# Patient Record
Sex: Male | Born: 1960
Health system: Southern US, Community
[De-identification: ages and names within clinical notes are randomized; demographics above are authoritative.]

## PROBLEM LIST (undated history)

## (undated) DIAGNOSIS — M6250 Muscle wasting and atrophy, not elsewhere classified, unspecified site: Secondary | ICD-10-CM

## (undated) DIAGNOSIS — Z973 Presence of spectacles and contact lenses: Secondary | ICD-10-CM

## (undated) DIAGNOSIS — R200 Anesthesia of skin: Secondary | ICD-10-CM

## (undated) DIAGNOSIS — Z923 Personal history of irradiation: Secondary | ICD-10-CM

## (undated) DIAGNOSIS — R29898 Other symptoms and signs involving the musculoskeletal system: Secondary | ICD-10-CM

## (undated) DIAGNOSIS — E039 Hypothyroidism, unspecified: Secondary | ICD-10-CM

## (undated) DIAGNOSIS — E782 Mixed hyperlipidemia: Secondary | ICD-10-CM

## (undated) DIAGNOSIS — H903 Sensorineural hearing loss, bilateral: Secondary | ICD-10-CM

## (undated) DIAGNOSIS — R490 Dysphonia: Secondary | ICD-10-CM

## (undated) DIAGNOSIS — R29810 Facial weakness: Secondary | ICD-10-CM

## (undated) DIAGNOSIS — R1312 Dysphagia, oropharyngeal phase: Secondary | ICD-10-CM

## (undated) DIAGNOSIS — R202 Paresthesia of skin: Secondary | ICD-10-CM

## (undated) DIAGNOSIS — K4090 Unilateral inguinal hernia, without obstruction or gangrene, not specified as recurrent: Secondary | ICD-10-CM

## (undated) DIAGNOSIS — C801 Malignant (primary) neoplasm, unspecified: Secondary | ICD-10-CM

## (undated) DIAGNOSIS — I10 Essential (primary) hypertension: Secondary | ICD-10-CM

## (undated) DIAGNOSIS — M1990 Unspecified osteoarthritis, unspecified site: Secondary | ICD-10-CM

## (undated) DIAGNOSIS — I951 Orthostatic hypotension: Secondary | ICD-10-CM

## (undated) DIAGNOSIS — Z974 Presence of external hearing-aid: Secondary | ICD-10-CM

## (undated) DIAGNOSIS — G5602 Carpal tunnel syndrome, left upper limb: Secondary | ICD-10-CM

## (undated) DIAGNOSIS — E785 Hyperlipidemia, unspecified: Secondary | ICD-10-CM

## (undated) DIAGNOSIS — K219 Gastro-esophageal reflux disease without esophagitis: Secondary | ICD-10-CM

## (undated) DIAGNOSIS — R350 Frequency of micturition: Secondary | ICD-10-CM

## (undated) HISTORY — DX: Hyperlipidemia, unspecified: E78.5

---

## 2015-05-13 DIAGNOSIS — Z9221 Personal history of antineoplastic chemotherapy: Secondary | ICD-10-CM

## 2015-05-13 HISTORY — DX: Personal history of antineoplastic chemotherapy: Z92.21

## 2015-12-15 DIAGNOSIS — J018 Other acute sinusitis: Secondary | ICD-10-CM | POA: Diagnosis not present

## 2015-12-15 DIAGNOSIS — H66002 Acute suppurative otitis media without spontaneous rupture of ear drum, left ear: Secondary | ICD-10-CM | POA: Diagnosis not present

## 2016-01-07 DIAGNOSIS — R6884 Jaw pain: Secondary | ICD-10-CM | POA: Diagnosis not present

## 2016-01-07 DIAGNOSIS — I10 Essential (primary) hypertension: Secondary | ICD-10-CM | POA: Diagnosis not present

## 2016-01-07 DIAGNOSIS — Z6829 Body mass index (BMI) 29.0-29.9, adult: Secondary | ICD-10-CM | POA: Diagnosis not present

## 2016-02-06 ENCOUNTER — Other Ambulatory Visit: Payer: Self-pay | Admitting: Internal Medicine

## 2016-02-06 DIAGNOSIS — R6884 Jaw pain: Secondary | ICD-10-CM

## 2016-02-06 DIAGNOSIS — L048 Acute lymphadenitis of other sites: Secondary | ICD-10-CM | POA: Diagnosis not present

## 2016-02-06 DIAGNOSIS — Z6829 Body mass index (BMI) 29.0-29.9, adult: Secondary | ICD-10-CM | POA: Diagnosis not present

## 2016-02-07 ENCOUNTER — Ambulatory Visit
Admission: RE | Admit: 2016-02-07 | Discharge: 2016-02-07 | Disposition: A | Discharge status: Home / Self Care | Payer: BLUE CROSS/BLUE SHIELD | Source: Ambulatory Visit | Attending: Internal Medicine | Admitting: Internal Medicine

## 2016-02-07 DIAGNOSIS — R6884 Jaw pain: Secondary | ICD-10-CM

## 2016-02-07 DIAGNOSIS — R1310 Dysphagia, unspecified: Secondary | ICD-10-CM | POA: Diagnosis not present

## 2016-02-07 DIAGNOSIS — R220 Localized swelling, mass and lump, head: Secondary | ICD-10-CM | POA: Diagnosis not present

## 2016-02-07 MED ORDER — IOPAMIDOL (ISOVUE-300) INJECTION 61%
75.0000 mL | Freq: Once | INTRAVENOUS | Status: AC | PRN
  Administered 2016-02-07: 75 mL via INTRAVENOUS

## 2016-02-10 DIAGNOSIS — C099 Malignant neoplasm of tonsil, unspecified: Secondary | ICD-10-CM

## 2016-02-10 HISTORY — DX: Malignant neoplasm of tonsil, unspecified: C09.9

## 2016-02-14 DIAGNOSIS — R1310 Dysphagia, unspecified: Secondary | ICD-10-CM | POA: Diagnosis not present

## 2016-02-14 DIAGNOSIS — C099 Malignant neoplasm of tonsil, unspecified: Secondary | ICD-10-CM | POA: Diagnosis not present

## 2016-02-14 DIAGNOSIS — D3709 Neoplasm of uncertain behavior of other specified sites of the oral cavity: Secondary | ICD-10-CM | POA: Diagnosis not present

## 2016-02-14 DIAGNOSIS — J343 Hypertrophy of nasal turbinates: Secondary | ICD-10-CM | POA: Diagnosis not present

## 2016-02-14 DIAGNOSIS — C109 Malignant neoplasm of oropharynx, unspecified: Secondary | ICD-10-CM | POA: Diagnosis not present

## 2016-02-14 DIAGNOSIS — C770 Secondary and unspecified malignant neoplasm of lymph nodes of head, face and neck: Secondary | ICD-10-CM | POA: Diagnosis not present

## 2016-02-14 DIAGNOSIS — R221 Localized swelling, mass and lump, neck: Secondary | ICD-10-CM | POA: Diagnosis not present

## 2016-02-20 ENCOUNTER — Telehealth: Payer: Self-pay | Admitting: *Deleted

## 2016-02-20 ENCOUNTER — Other Ambulatory Visit: Payer: Self-pay | Admitting: Radiation Oncology

## 2016-02-20 ENCOUNTER — Encounter: Payer: Self-pay | Admitting: *Deleted

## 2016-02-20 ENCOUNTER — Encounter: Payer: Self-pay | Admitting: Hematology and Oncology

## 2016-02-20 NOTE — Telephone Encounter (Signed)
Oncology Nurse Navigator Documentation  Spoke with Mr. Weiand wife:  Informed her of adjusted/new appointments next Tuesday 1017:  Dr. Alvy Bimler 12:00, 1:00 RadOnc Nurse Eval and 12:30 Dr. Isidore Moos.    I explained we are in the process of scheduling PET with goal of completing before these appointments.  I answered her questions re radiation and chemotherapy treatment.    We discussed placement of PEG and PAC, evaluation by Dental Medicine, expected timeframe of treatment start date.  I provided her my contact information, encouraged her to call with questions prior to next week's appointments. She expressed appreciation for my call.  Gayleen Orem, RN, BSN, [place] at [place] [phone]

## 2016-02-20 NOTE — Telephone Encounter (Signed)
Oncology Nurse Navigator Documentation  Placed introductory call to new referral patient Todd [name].  Introduced myself as the H&N oncology nurse navigator that works with Drs. Isidore Moos and Alvy Bimler to whom he has been referred by Dr. Redmond Baseman.  He confirmed his understanding of referral and appt date of 10/17 to see Dr. Isidore Moos.  I explained I am in the process of scheduling appt with Dr. Alvy Bimler for same day.  He stated Dr. Redmond Baseman is scheduling PET, I noted I would follow-up with his office.  I briefly explained my role as his navigator, indicated that I would be joining him during his appts next week.  I confirmed his understanding of [place] location.    I provided my contact information, encouraged him to call with questions/concerns before next week.  He verbalized understanding of information provided, expressed appreciation for my call.  Gayleen Orem, RN, BSN, [name] at [place] [phone]

## 2016-02-21 ENCOUNTER — Telehealth: Payer: Self-pay | Admitting: *Deleted

## 2016-02-21 ENCOUNTER — Encounter: Payer: Self-pay | Admitting: Radiation Oncology

## 2016-02-21 ENCOUNTER — Other Ambulatory Visit: Payer: Self-pay | Admitting: Radiation Therapy

## 2016-02-21 ENCOUNTER — Other Ambulatory Visit: Payer: Self-pay | Admitting: Radiation Oncology

## 2016-02-21 DIAGNOSIS — C099 Malignant neoplasm of tonsil, unspecified: Secondary | ICD-10-CM

## 2016-02-21 NOTE — Telephone Encounter (Signed)
Oncology Nurse Navigator Documentation  Spoke with patient's wife, informed he has PET at [facility] next Tuesday morning, 0800 arrival, [facility], to be NPO including candy/gum beginning Monday midnight.  She voiced understanding.  Gayleen Orem, RN, BSN, [place] at [place] [phone]

## 2016-02-21 NOTE — Progress Notes (Signed)
Head and Neck Cancer Location of Tumor / Histology:  02/14/16 LEFT TONSIL, BIOPSY: Invasive squamous cell carcinoma, moderately differentiated TestResults Reference Values HPVHR type 16, PCRNegativeNegative HPVHR type 18, PCRNegativeNegative HPV other HR types, PCR NegativeNegative (Negative for one of the other High Risk HPV types:31, 33, 35, 39, 45, 51, 52, 56, 58, 59, 66 and 68) 02/14/16 Left Neck mass, Fine Needle Aspiration I (smears and ThinPrep): Squamous cell carcinoma.  Patient presented with symptoms of: Dr. Redmond Baseman documents on 02/14/16: Todd [name] presents as a new patient with a chief complaint of neck mass. Todd [name] was found to have a swollen lymph node in the left neck in April. At the end of July, his jaw locked open. Todd [name] forced it shut. Todd [name] went to a minute clinic in August due to post-nasal drainage. Todd [name] was treated with Augmentin. The practitioner noted the left neck node swelling. Todd [name] was then seen at his PCP where Todd [name] was treated with prednisone. X-rays of the jaw looked fine. About 1 1/2 weeks ago, Todd [name] started having difficulty swallowing. Solid or liquid is difficult to swallow. Last week, his doctor ordered a CT scan of the neck and referred him here. Todd [name] feels some pain in his left jaw. Biting down on his first bite of food is painful. His voice is sounding more muffled. Todd [name] has lost 9 pounds in the past two weeks. Last weekend, Todd [name] spit out 4-5 mouthfuls of blood. Todd [name] smoked a couple of years in his teens. Todd [name] is not a heavy drinker.  Biopsies of Left Tonsil revealed: Invasive squamous cell carcinoma, moderately differentiated.   Nutrition Status Yes No Comments  Weight changes? [x]  []  Todd [name] reports losing about 10-12 lbs over the last 4 weeks  Swallowing concerns? [x]  []  Todd [name] has problems swallowing, says "it just won't go down". Food that Todd [name] can chew up go down easier than softer foods. Todd [name] is  able to drink liquids.   PEG? []  [x]     Referrals Yes No Comments  Social Work? []  [x]    Dentistry? []  [x]    Swallowing therapy? []  [x]    Nutrition? []  [x]    Med/Onc? [x]  []  Dr. Alvy Bimler, 02/26/16. Todd [name] will receive Cisplatin Q 21 days X 3 doses.   Safety Issues Yes No Comments  Prior radiation? []  [x]    Pacemaker/ICD? []  [x]    Possible current pregnancy? []  [x]    Is the patient on methotrexate? []  [x]     Tobacco/Marijuana/Snuff/ETOH use: Todd [name] smoked briefly in his teens. Todd [name] denies alcohol use.   Past/Anticipated interventions by otolaryngology, if any:  Dr. Redmond Baseman 02/14/16 Procedure: Biopsy of left tonsil mass --Todd [name] recommends concurrent chemotherapy and radiation.    Past/Anticipated interventions by medical oncology, if any:  Dr. Alvy Bimler today (02/26/16). She recommends cisplatin every 3 weeks for 3 doses total.      Current Complaints / other details:   PET 02/26/16 8:30  BP 131/87   Pulse 64   Temp 98.2 F (36.8 C)   Ht 5\' 9"  (1.753 m)   Wt 195 lb 9.6 oz (88.7 kg)   SpO2 96% Comment: room air  BMI 28.89 kg/m    Wt Readings from Last 3 Encounters:  02/26/16 195 lb 9.6 oz (88.7 kg)  02/26/16 195 lb 8 oz (88.7 kg)  02/26/16 193 lb (87.5 kg)

## 2016-02-25 ENCOUNTER — Telehealth: Payer: Self-pay | Admitting: *Deleted

## 2016-02-25 NOTE — Progress Notes (Addendum)
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 Carcinoma of tonsillar fossa (HCC) 146.1 C09.0     HISTORY OF PRESENT ILLNESS::Lenoard Stacey is a 55 y.o. male who presented with a swollen lymph node in the left neck in April. At the end of July, his jaw locked open. He forced it shut. He went to a minute clinic in August due to post-nasal drainage. He was treated with Augmentin. The practitioner noted the left neck node swelling. He was then seen at his PCP where he was treated with prednisone.     Recently, he started having difficulty swallowing, especially solids. His doctor ordered a CT scan of the neck and referred him To Dr. Redmond Baseman. He reported some pain in his left jaw. Prior to the day of biopsy he spit out 4-5 mouthfuls of blood. He smoked a couple of years in his teens. No snuff. He has ETOH seldom.   Biopsy of left tonsil on 02-14-16 revealed:   Invasive squamous cell carcinoma, moderately differentiated. Negative for HPV.   Pertinent imaging thus far includes CT neck from 9-28 and PET CT on 10-17.  Images reviewed.  PET is notable for revealing bilateral malignant neck adenopathy.  The left dominant upper neck mass is >6cm.  The left tonsil tumor appears T2.  Non specific subcentimeter lung nodules.      Nutrition Status Yes No Comments  Weight changes? [x]  []  He reports losing about 10-12 lbs over the last 4 weeks  Swallowing concerns? [x]  []  He has problems swallowing, says "it just won't go down". Food that he can chew up go down easier than softer foods. He is able to drink liquids.   PEG? []  [x]     Referrals Yes No Comments    Social Work? []  [x]    Dentistry? []  [x]    Swallowing therapy? []  [x]    Nutrition? []  [x]    Med/Onc? [x]  []  Dr. Alvy Bimler, 02/26/16. He will receive Cisplatin Q 21 days X 3 doses.   Safety Issues Yes No Comments  Prior radiation? []  [x]    Pacemaker/ICD? []  [x]    Possible current pregnancy? []  [x]    Is the patient on methotrexate? []  [x]      PREVIOUS RADIATION THERAPY: No  PAST MEDICAL HISTORY:  has a past medical history of Arthritis; Cancer ([place]); Hyperlipidemia; Hypertension; and Hypothyroidism.    PAST SURGICAL HISTORY:No past surgical history on file.  FAMILY HISTORY: family history includes Cancer in his maternal grandmother.  SOCIAL HISTORY:  reports that he has quit smoking. He has never used smokeless tobacco. He reports that he does not drink alcohol or use drugs.  ALLERGIES: Review of patient's allergies indicates no known allergies.  MEDICATIONS:  Current Outpatient Prescriptions  Medication Sig Dispense Refill  . acetaminophen (TYLENOL) 650 MG CR tablet Take 1,300 mg by mouth.    . fenofibrate (TRICOR) 145 MG tablet     . GLUCOSAMINE SULFATE PO Take by mouth.    . levothyroxine (SYNTHROID, LEVOTHROID) 50 MCG tablet     . losartan (COZAAR) 100 MG tablet     . Multiple Vitamin (MULTIVITAMIN) capsule Take by mouth.    [name] aspirin EC 81 MG tablet  Take by mouth.     No current facility-administered medications for this encounter.     REVIEW OF SYSTEMS:  Notable for that above.   PHYSICAL EXAM:  height is 5\' 9"  (1.753 m) and weight is 195 lb 9.6 oz (88.7 kg). His temperature is 98.2 F (36.8 C). His blood pressure is 131/87 and his pulse is 64. His oxygen saturation is 96%.   General: Alert and oriented, in no acute distress HEENT: Head is normocephalic. Extraocular movements are intact. Oropharynx is notable for ulcerative left tonsil mass which has started to erode left lateral uvula and appears to involve base of tongue as well. Neck: Neck is notable for palpable  level Ib through level 2,3 left neck mass, at least 6cm Heart: Regular in rate and rhythm with no murmurs, rubs, or gallops. Chest: Clear to auscultation bilaterally, with no rhonchi, wheezes, or rales. Abdomen: Soft, nontender, nondistended, with no rigidity or guarding. Extremities: No cyanosis or edema. Lymphatics: see Neck Exam Skin: No concerning lesions. Musculoskeletal: symmetric strength and muscle tone throughout. Neurologic: Cranial nerves II through XII are grossly intact. No obvious focalities. Speech is fluent. Coordination is intact. Psychiatric: Judgment and insight are intact. Affect is appropriate.   ECOG = 0  0 - Asymptomatic (Fully active, able to carry on all predisease activities without restriction)  1 - Symptomatic but completely ambulatory (Restricted in physically strenuous activity but ambulatory and able to carry out work of a light or sedentary nature. For example, light housework, office work)  2 - Symptomatic, <50% in bed during the day (Ambulatory and capable of all self care but unable to carry out any work activities. Up and about more than 50% of waking hours)  3 - Symptomatic, >50% in bed, but not bedbound (Capable of only limited self-care, confined to bed or chair 50% or more of waking hours)  4 - Bedbound (Completely disabled. Cannot carry on any self-care. Totally confined to bed or chair)  5 - Death   Eustace Pen MM, Creech RH, Tormey DC, et al. [phone]). "Toxicity and response criteria of the [facility]". [place] Oncol. 5 (6): 649-55   LABORATORY DATA:  No results found for: WBC, HGB, HCT, MCV, PLT CMP  No results found for: NA, K, CL, CO2, GLUCOSE, BUN, CREATININE, CALCIUM, PROT, ALBUMIN, AST, ALT, ALKPHOS, BILITOT, GFRNONAA, GFRAA    No results found for: TSH     RADIOGRAPHY: Ct Soft Tissue Neck W Contrast  Result Date: 02/07/2016 CLINICAL DATA:  Left jaw pain and swelling.  Dysphagia. EXAM: CT MAXILLOFACIAL WITH  CONTRAST CT NECK WITH CONTRAST TECHNIQUE: Multidetector CT imaging of the maxillofacial structures and neck was performed with intravenous contrast. Multiplanar CT image reconstructions were also generated. A small metallic BB was placed on the right temple in order to reliably differentiate right from left. CONTRAST:  45mL ISOVUE-300 IOPAMIDOL (ISOVUE-300) INJECTION 61% COMPARISON:  None. FINDINGS: CT MAXILLOFACIAL FINDINGS Osseous:  No facial fracture identified. Orbits: The globes appear intact. Normal appearance of the intra- and extraconal fat. Symmetric extraocular muscles. Sinuses: No fluid levels or advanced mucosal thickening. Limited intracranial: Normal. CT NECK FINDINGS Pharynx and larynx: There is asymmetric fullness of the left pharynx, which begins at the left tonsillar pillar and extends inferiorly to the level of the hyoid bone, where there is extension into the left vallecular. The left parapharyngeal space is displaced anteriorly. It is difficult to discern whether this is a single lesion or multiple confluent lesions. No retropharyngeal collection.  The nasopharynx is clear. Salivary glands: Thyroid:  Normal. Lymph nodes: There is confluent adenopathy within the left neck, extending from left level IIa 2 left level 4. This confluent soft tissue measures approximately 5.4 x 2.9 by 8.0 cm. The largest lymph node on the right measures 1.2 cm and is located at level IIa. Vascular:  The major cervical vessels are normal. Upper chest:  No pulmonary nodules or pleural effusion. Musculoskeletal: No lytic or blastic lesions. No bony spinal canal stenosis. IMPRESSION: 1. Abnormal soft tissue within the left pharynx, extending from the left tonsillar pillar inferiorly to the level of the hyoid and filling the left vallecula. The appearance is most consistent with a neoplastic process. 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 Histologic sampling and direct visualization should be  considered. 2. Confluent adenopathy within the left cervical chain with areas of central hypoattenuation suggesting degrees of necrosis. This is most suggestive of metastatic spread. The areas of necrosis within the adenopathy combined with the above-described pharyngeal soft tissue mass are together suggestive of squamous cell carcinoma. These results will be called to the ordering clinician or representative by the Radiologist Assistant, and communication documented in the PACS or zVision Dashboard. Electronically Signed   By: Ulyses Jarred M.D.   On: 02/07/2016 18:21   Nm Pet Image Initial (pi) Skull Base To Thigh  Result Date: 02/26/2016 CLINICAL DATA:  Initial treatment strategy for tonsillar cancer. Left neck mass. EXAM: NUCLEAR MEDICINE PET SKULL BASE TO THIGH TECHNIQUE: 12.6 mCi F-18 FDG was injected intravenously. Full-ring PET imaging was performed from the skull base to thigh after the radiotracer. CT data was obtained and used for attenuation correction and anatomic localization. FASTING BLOOD GLUCOSE:  Value: 102 mg/dl COMPARISON:  02/07/2016 FINDINGS: NECK Abnormal left tonsillar pillar and pharyngeal activity down to the vallecula as on recent CT, maximum standard uptake value of this conglomerate mass 17.9. Left station IIa and station III lymph nodes are present, maximum SUV 12.1. There is also a left station Ib indistinct lymph node measuring about 10 mm in short axis with maximum SUV 9.2. A right station IIa lymph node measuring 1.3 cm in short axis on image 28 series 3 has maximum standard uptake value of 9.0 Chronic bilateral maxillary sinusitis. Small right-sided segment to be lymph node measuring only about 5 mm in short axis has a maximum standard uptake value of 3.4, borderline increased. CHEST No hypermetabolic mediastinal or hilar nodes. 0.7 by 0.4 cm right upper lobe nodule image 87/3, no associated hypermetabolic activity is appreciable, with the understanding that nodules of this  size range too small for accurate detection. Along the right major fissure there is a 2-3 mm nodule on image 108/3, again no measurable hypermetabolic activity but technically too small to characterize. 3 mm similar nodule in the left lower lobe, image 100/3. Coronary artery atherosclerosis. ABDOMEN/PELVIS No abnormal hypermetabolic activity within the liver, pancreas, adrenal glands, or spleen. No hypermetabolic lymph nodes in the abdomen or pelvis. SKELETON No focal hypermetabolic activity to suggest skeletal metastasis. IMPRESSION: 1. Hypermetabolic left tonsillar in pharyngeal mass with hypermetabolic left sided station IA, station III, and station Ib adenopathy and hypermetabolic station IIa adenopathy on the right. Borderline hypermetabolic small right station IIb lymph node. 2. Several tiny lung nodules are observed, these are not hypermetabolic but are also below the threshold level for accurate characterization. Dedicated CT chest may be warranted to act as a baseline. 3. Coronary artery atherosclerosis. Electronically Signed   By: Thayer Jew  Janeece Fitting M.D.   On: 02/26/2016 13:59   Ct Maxillofacial W Contrast  Result Date: 02/07/2016 CLINICAL DATA:  Left jaw pain and swelling.  Dysphagia. EXAM: CT MAXILLOFACIAL WITH CONTRAST CT NECK WITH CONTRAST TECHNIQUE: Multidetector CT imaging of the maxillofacial structures and neck was performed with intravenous contrast. Multiplanar CT image reconstructions were also generated. A small metallic BB was placed on the right temple in order to reliably differentiate right from left. CONTRAST:  47mL ISOVUE-300 IOPAMIDOL (ISOVUE-300) INJECTION 61% COMPARISON:  None. FINDINGS: CT MAXILLOFACIAL FINDINGS Osseous:  No facial fracture identified. Orbits: The globes appear intact. Normal appearance of the intra- and extraconal fat. Symmetric extraocular muscles. Sinuses: No fluid levels or advanced mucosal thickening. Limited intracranial: Normal. CT NECK FINDINGS Pharynx and  larynx: There is asymmetric fullness of the left pharynx, which begins at the left tonsillar pillar and extends inferiorly to the level of the hyoid bone, where there is extension into the left vallecular. The left parapharyngeal space is displaced anteriorly. It is difficult to discern whether this is a single lesion or multiple confluent lesions. No retropharyngeal collection. The nasopharynx is clear. Salivary glands: Thyroid:  Normal. Lymph nodes: There is confluent adenopathy within the left neck, extending from left level IIa 2 left level 4. This confluent soft tissue measures approximately 5.4 x 2.9 by 8.0 cm. The largest lymph node on the right measures 1.2 cm and is located at level IIa. Vascular:  The major cervical vessels are normal. Upper chest:  No pulmonary nodules or pleural effusion. Musculoskeletal: No lytic or blastic lesions. No bony spinal canal stenosis. IMPRESSION: 1. Abnormal soft tissue within the left pharynx, extending from the left tonsillar pillar inferiorly to the level of the hyoid and filling the left vallecula. The appearance is most consistent with a neoplastic process. 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 Histologic sampling and direct visualization should be considered. 2. Confluent adenopathy within the left cervical chain with areas of central hypoattenuation suggesting degrees of necrosis. This is most suggestive of metastatic spread. The areas of necrosis within the adenopathy combined with the above-described pharyngeal soft tissue mass are together suggestive of squamous cell carcinoma. These results will be called to the ordering clinician or representative by the Radiologist Assistant, and communication documented in the PACS or zVision Dashboard. Electronically Signed   By: Ulyses Jarred M.D.   On: 02/07/2016 18:21      IMPRESSION/PLAN:  This is a delightful patient with tonsil head and neck cancer. I do recommend radiotherapy for this  patient. Tumor board discussion is tomorrow.  We discussed the potential risks, benefits, and side effects of radiotherapy. We talked in detail about acute and late effects. We discussed that some of the most bothersome acute effects may be mucositis, dysgeusia, salivary changes, skin irritation, hair loss, dehydration, weight loss and fatigue. We talked about late effects which include but are not necessarily limited to dysphagia, hypothyroidism, nerve injury, spinal cord injury, xerostomia, trismus, and neck edema. No guarantees of treatment were given. A consent form was signed and placed in the patient's medical record. The patient is enthusiastic about proceeding with treatment. I look forward to participating in the patient's care.    Simulation (treatment planning) will take place after clearance by dentistry  We also discussed that the treatment of head and neck cancer is a multidisciplinary process to maximize treatment outcomes and quality of life. For this reasons the following referrals have been or will be made (thank you to Med Onc  for making the referrals)    Medical oncology to discuss chemotherapy - recommended CDDP concurrently    Dentistry for dental evaluation, possible extractions in the radiation fields, and /or advice on reducing risk of cavities, osteoradionecrosis, or other oral issues.    Nutritionist for nutrition support during and after treatment.    Speech language pathology for swallowing and/or speech therapy.    Social work for social support.     Physical therapy due to risk of lymphedema in neck and deconditioning.  __________________________________________   Eppie Gibson, MD

## 2016-02-25 NOTE — Telephone Encounter (Signed)
We received a letter about appointments that reads to call to comfirm.  He will be present for all appointments.

## 2016-02-26 ENCOUNTER — Encounter: Payer: Self-pay | Admitting: Hematology and Oncology

## 2016-02-26 ENCOUNTER — Ambulatory Visit
Admission: RE | Admit: 2016-02-26 | Discharge: 2016-02-26 | Disposition: A | Discharge status: Home / Self Care | Payer: BLUE CROSS/BLUE SHIELD | Source: Ambulatory Visit | Attending: Radiation Oncology | Admitting: Radiation Oncology

## 2016-02-26 ENCOUNTER — Encounter (HOSPITAL_COMMUNITY): Payer: BLUE CROSS/BLUE SHIELD

## 2016-02-26 ENCOUNTER — Ambulatory Visit (HOSPITAL_BASED_OUTPATIENT_CLINIC_OR_DEPARTMENT_OTHER): Payer: BLUE CROSS/BLUE SHIELD | Admitting: Hematology and Oncology

## 2016-02-26 ENCOUNTER — Encounter: Payer: Self-pay | Admitting: *Deleted

## 2016-02-26 ENCOUNTER — Encounter: Payer: Self-pay | Admitting: Radiation Oncology

## 2016-02-26 ENCOUNTER — Encounter
Admission: RE | Admit: 2016-02-26 | Discharge: 2016-02-26 | Disposition: A | Discharge status: Home / Self Care | Payer: BLUE CROSS/BLUE SHIELD | Source: Ambulatory Visit | Attending: Radiation Oncology | Admitting: Radiation Oncology

## 2016-02-26 VITALS — BP 139/74 | HR 65 | Temp 98.0°F | Resp 18 | Ht 69.0 in | Wt 195.5 lb

## 2016-02-26 DIAGNOSIS — Z809 Family history of malignant neoplasm, unspecified: Secondary | ICD-10-CM | POA: Diagnosis not present

## 2016-02-26 DIAGNOSIS — E039 Hypothyroidism, unspecified: Secondary | ICD-10-CM | POA: Insufficient documentation

## 2016-02-26 DIAGNOSIS — R634 Abnormal weight loss: Secondary | ICD-10-CM | POA: Insufficient documentation

## 2016-02-26 DIAGNOSIS — Z7982 Long term (current) use of aspirin: Secondary | ICD-10-CM | POA: Insufficient documentation

## 2016-02-26 DIAGNOSIS — R042 Hemoptysis: Secondary | ICD-10-CM | POA: Diagnosis not present

## 2016-02-26 DIAGNOSIS — C099 Malignant neoplasm of tonsil, unspecified: Secondary | ICD-10-CM

## 2016-02-26 DIAGNOSIS — C090 Malignant neoplasm of tonsillar fossa: Secondary | ICD-10-CM

## 2016-02-26 DIAGNOSIS — R1310 Dysphagia, unspecified: Secondary | ICD-10-CM | POA: Insufficient documentation

## 2016-02-26 DIAGNOSIS — M1990 Unspecified osteoarthritis, unspecified site: Secondary | ICD-10-CM | POA: Diagnosis not present

## 2016-02-26 DIAGNOSIS — Z510 Encounter for antineoplastic radiation therapy: Secondary | ICD-10-CM | POA: Insufficient documentation

## 2016-02-26 DIAGNOSIS — Z79899 Other long term (current) drug therapy: Secondary | ICD-10-CM | POA: Diagnosis not present

## 2016-02-26 DIAGNOSIS — I10 Essential (primary) hypertension: Secondary | ICD-10-CM | POA: Insufficient documentation

## 2016-02-26 DIAGNOSIS — E785 Hyperlipidemia, unspecified: Secondary | ICD-10-CM | POA: Insufficient documentation

## 2016-02-26 DIAGNOSIS — Z87891 Personal history of nicotine dependence: Secondary | ICD-10-CM

## 2016-02-26 HISTORY — DX: Malignant (primary) neoplasm, unspecified: C80.1

## 2016-02-26 HISTORY — DX: Essential (primary) hypertension: I10

## 2016-02-26 HISTORY — DX: Hypothyroidism, unspecified: E03.9

## 2016-02-26 HISTORY — DX: Unspecified osteoarthritis, unspecified site: M19.90

## 2016-02-26 LAB — GLUCOSE, CAPILLARY: GLUCOSE-CAPILLARY: 102 mg/dL — AB (ref 65–99)

## 2016-02-26 MED ORDER — FLUDEOXYGLUCOSE F - 18 (FDG) INJECTION
12.0000 | Freq: Once | INTRAVENOUS | Status: AC
  Administered 2016-02-26: 12.64 via INTRAVENOUS

## 2016-02-26 NOTE — Progress Notes (Signed)
[place] NOTE  Patient Care Team: Haywood Pao, MD as PCP - General (Internal Medicine) Leota Sauers, RN as Oncology Nurse Navigator Eppie Gibson, MD as Attending Physician (Radiation Oncology) Heath Lark, MD as Consulting Physician (Hematology and Oncology) Karie Mainland, RD as Dietitian (Nutrition)  CHIEF COMPLAINTS/PURPOSE OF CONSULTATION:  Left tonsil cancer with bilateral lymphadenopathy  HISTORY OF PRESENTING ILLNESS:  Todd [name] 55 y.o. male is here because of newly diagnosed left tonsil cancer According to the patient, the first initial presentation was due to sensation of locked jaw, dysphagia and palpable regional lymphadenopathy. He is not able to tolerate certain consistency of the foods such as bread & mashed potato but was able to tolerate certain solid food and liquids. The patient has noted progressive lymphadenopathy in the size of the lymph nodes on the left side of the neck. Starting around and of July 2017, he had an episode of locked jaw. He was seen at the urgent care place and was prescribed Augmentin and prednisone with no resolution. He started to have hemoptysis and progressive dysphagia. He was subsequently evaluated with imaging study and biopsy. Summary of his oncology history as follows:   Tonsil cancer ([place])   02/07/2016 Imaging    Ct neck showed abnormal soft tissue within the left pharynx, extending from the left tonsillar pillar inferiorly to the level of the hyoid and filling the left vallecula. The appearance is most consistent with a neoplastic process. 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 Histologic sampling and direct visualization should be considered. Confluent adenopathy within the left cervical chain with areas of central hypoattenuation suggesting degrees of necrosis. This is most suggestive of metastatic spread. The areas of necrosis within the adenopathy combined with the  above-described pharyngeal soft tissue mass are together suggestive of squamous cell carcinoma.      02/14/2016 Procedure    He has FNA of left neck LN       02/14/2016 Pathology Results    [id number] cytology confirmed squamous cell carcinoma       he has mild left sided hearing deficit, difficulties with chewing food, swallowing difficulties, and 10 pounds weight loss but denies painful swallowing or changes in the quality of his voice.  MEDICAL HISTORY:  Past Medical History:  Diagnosis Date  . Arthritis   . Cancer ([place])    Tonsil Cancer  . Hyperlipidemia   . Hypertension   . Hypothyroidism     SURGICAL HISTORY: History reviewed. No pertinent surgical history.  SOCIAL HISTORY: Social History   Social History  . Marital status: Married    Spouse name: Jenny Reichmann  . Number of children: 3  . Years of education: N/A   Occupational History  . sales rep    Social History Main Topics  . Smoking status: Former Research scientist (life sciences)  . Smokeless tobacco: Never Used  . Alcohol use No  . Drug use: No  . Sexual activity: Not on file   Other Topics Concern  . Not on file   Social History Narrative  . No narrative on file    FAMILY HISTORY: Family History  Problem Relation Age of Onset  . Cancer Maternal Grandmother     unknown ca    ALLERGIES:  has No Known Allergies.  MEDICATIONS:  Current Outpatient Prescriptions  Medication Sig Dispense Refill  . acetaminophen (TYLENOL) 650 MG CR tablet Take 1,300 mg by mouth.    [name] aspirin EC 81 MG tablet Take by mouth.    [name]  fenofibrate (TRICOR) 145 MG tablet     . GLUCOSAMINE SULFATE PO Take by mouth.    . levothyroxine (SYNTHROID, LEVOTHROID) 50 MCG tablet     . losartan (COZAAR) 100 MG tablet     . Multiple Vitamin (MULTIVITAMIN) capsule Take by mouth.     No current facility-administered medications for this visit.     REVIEW OF SYSTEMS:   Constitutional: Denies fevers, chills or abnormal night sweats Eyes: Denies blurriness of  vision, double vision or watery eyes Respiratory: Denies cough, dyspnea or wheezes Cardiovascular: Denies palpitation, chest discomfort or lower extremity swelling Gastrointestinal:  Denies nausea, heartburn or change in bowel habits Skin: Denies abnormal skin rashes Neurological:Denies numbness, tingling or new weaknesses Behavioral/Psych: Mood is stable, no new changes  All other systems were reviewed with the patient and are negative.  PHYSICAL EXAMINATION: ECOG PERFORMANCE STATUS: 1 - Symptomatic but completely ambulatory  Vitals:   02/26/16 1152  BP: 139/74  Pulse: 65  Resp: 18  Temp: 98 F (36.7 C)   Filed Weights   02/26/16 1152  Weight: 195 lb 8 oz (88.7 kg)    GENERAL:alert, no distress and comfortable SKIN: skin color, texture, turgor are normal, no rashes or significant lesions EYES: normal, conjunctiva are pink and non-injected, sclera clear OROPHARYNX:no exudate, no erythema and lips, buccal mucosa, and tongue normal  NECK: supple, thyroid normal size, non-tender, without nodularity LYMPH:  no palpable lymphadenopathy in the cervical, axillary or inguinal LUNGS: clear to auscultation and percussion with normal breathing effort HEART: regular rate & rhythm and no murmurs and no lower extremity edema ABDOMEN:abdomen soft, non-tender and normal bowel sounds Musculoskeletal:no cyanosis of digits and no clubbing  PSYCH: alert & oriented x 3 with fluent speech NEURO: no focal motor/sensory deficits  RADIOGRAPHIC STUDIES: I reviewed PET CT scan with the patient I have personally reviewed the radiological images as listed and agreed with the findings in the report. Ct Soft Tissue Neck W Contrast  Result Date: 02/07/2016 CLINICAL DATA:  Left jaw pain and swelling.  Dysphagia. EXAM: CT MAXILLOFACIAL WITH CONTRAST CT NECK WITH CONTRAST TECHNIQUE: Multidetector CT imaging of the maxillofacial structures and neck was performed with intravenous contrast. Multiplanar CT image  reconstructions were also generated. A small metallic BB was placed on the right temple in order to reliably differentiate right from left. CONTRAST:  45mL ISOVUE-300 IOPAMIDOL (ISOVUE-300) INJECTION 61% COMPARISON:  None. FINDINGS: CT MAXILLOFACIAL FINDINGS Osseous:  No facial fracture identified. Orbits: The globes appear intact. Normal appearance of the intra- and extraconal fat. Symmetric extraocular muscles. Sinuses: No fluid levels or advanced mucosal thickening. Limited intracranial: Normal. CT NECK FINDINGS Pharynx and larynx: There is asymmetric fullness of the left pharynx, which begins at the left tonsillar pillar and extends inferiorly to the level of the hyoid bone, where there is extension into the left vallecular. The left parapharyngeal space is displaced anteriorly. It is difficult to discern whether this is a single lesion or multiple confluent lesions. No retropharyngeal collection. The nasopharynx is clear. Salivary glands: Thyroid:  Normal. Lymph nodes: There is confluent adenopathy within the left neck, extending from left level IIa 2 left level 4. This confluent soft tissue measures approximately 5.4 x 2.9 by 8.0 cm. The largest lymph node on the right measures 1.2 cm and is located at level IIa. Vascular:  The major cervical vessels are normal. Upper chest:  No pulmonary nodules or pleural effusion. Musculoskeletal: No lytic or blastic lesions. No bony spinal canal stenosis. IMPRESSION: 1. Abnormal  soft tissue within the left pharynx, extending from the left tonsillar pillar inferiorly to the level of the hyoid and filling the left vallecula. The appearance is most consistent with a neoplastic process. 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 Histologic sampling and direct visualization should be considered. 2. Confluent adenopathy within the left cervical chain with areas of central hypoattenuation suggesting degrees of necrosis. This is most suggestive of metastatic  spread. The areas of necrosis within the adenopathy combined with the above-described pharyngeal soft tissue mass are together suggestive of squamous cell carcinoma. These results will be called to the ordering clinician or representative by the Radiologist Assistant, and communication documented in the PACS or zVision Dashboard. Electronically Signed   By: Ulyses Jarred M.D.   On: 02/07/2016 18:21   Ct Maxillofacial W Contrast  Result Date: 02/07/2016 CLINICAL DATA:  Left jaw pain and swelling.  Dysphagia. EXAM: CT MAXILLOFACIAL WITH CONTRAST CT NECK WITH CONTRAST TECHNIQUE: Multidetector CT imaging of the maxillofacial structures and neck was performed with intravenous contrast. Multiplanar CT image reconstructions were also generated. A small metallic BB was placed on the right temple in order to reliably differentiate right from left. CONTRAST:  48mL ISOVUE-300 IOPAMIDOL (ISOVUE-300) INJECTION 61% COMPARISON:  None. FINDINGS: CT MAXILLOFACIAL FINDINGS Osseous:  No facial fracture identified. Orbits: The globes appear intact. Normal appearance of the intra- and extraconal fat. Symmetric extraocular muscles. Sinuses: No fluid levels or advanced mucosal thickening. Limited intracranial: Normal. CT NECK FINDINGS Pharynx and larynx: There is asymmetric fullness of the left pharynx, which begins at the left tonsillar pillar and extends inferiorly to the level of the hyoid bone, where there is extension into the left vallecular. The left parapharyngeal space is displaced anteriorly. It is difficult to discern whether this is a single lesion or multiple confluent lesions. No retropharyngeal collection. The nasopharynx is clear. Salivary glands: Thyroid:  Normal. Lymph nodes: There is confluent adenopathy within the left neck, extending from left level IIa 2 left level 4. This confluent soft tissue measures approximately 5.4 x 2.9 by 8.0 cm. The largest lymph node on the right measures 1.2 cm and is located at level  IIa. Vascular:  The major cervical vessels are normal. Upper chest:  No pulmonary nodules or pleural effusion. Musculoskeletal: No lytic or blastic lesions. No bony spinal canal stenosis. IMPRESSION: 1. Abnormal soft tissue within the left pharynx, extending from the left tonsillar pillar inferiorly to the level of the hyoid and filling the left vallecula. The appearance is most consistent with a neoplastic process. 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 Histologic sampling and direct visualization should be considered. 2. Confluent adenopathy within the left cervical chain with areas of central hypoattenuation suggesting degrees of necrosis. This is most suggestive of metastatic spread. The areas of necrosis within the adenopathy combined with the above-described pharyngeal soft tissue mass are together suggestive of squamous cell carcinoma. These results will be called to the ordering clinician or representative by the Radiologist Assistant, and communication documented in the PACS or zVision Dashboard. Electronically Signed   By: Ulyses Jarred M.D.   On: 02/07/2016 18:21    ASSESSMENT:  Newly diagnosed squamous cell carcinoma of the Head & Neck, HPV Negative  PLAN:  Tonsil cancer ([place]) I discussed with the patient the rationale for concurrent chemoradiation therapy. In preparation for treatment, he would need to obtain dental clearance, multidisciplinary clinic to see dietitian, speech and language therapist and physical therapy along with chemotherapy education class I discussed  with the patient the rationale of placing port and feeding tube up front before treatment. We will also obtain baseline hearing test. I recommend concurrent chemoradiation therapy with high-dose cisplatin on days 1, 22 and 43. I will get the ENT navigator to coordinate appointments and I will see him back prior to start date of treatment    Hemoptysis This is due to untreated cancer. Recommend  avoid NSAID and aspirin  Weight loss He will be referred to speech therapist for swallow assessment and dietitian. Recommend high-calorie diet for now to avoid further weight loss   In preparation for treatment, the patient will need the following tests or referrals, to be arranged #1 Referral to Radiation Oncology for consultation.  #2 Referral to dentist for full dental evaluation and possible dental extraction  #3 PET/CT scan result is pending #4 Referral for feeding tube placement.  #5 Infusaport placement #6 Referral to Speech Pathologist  #7 Referral to Nutritionist  #8 Referral to Social Worker #9 Referral to chemotherapy class to learn about practical tips while on treatment.  #10 Blood work   Orders Placed This Encounter  Procedures  . Ambulatory Referral to Speech Therapy  (specifically to Garald Balding)    Referral Priority:   Routine    Referral Type:   Speech Therapy    Referral Reason:   Specialty Services Required    Requested Specialty:   Speech Pathology    Number of Visits Requested:   1  . Amb Referral to Nutrition and Diabetic Education (specifically to Ernestene Kiel)    Referral Priority:   Routine    Referral Type:   Consultation    Referral Reason:   Specialty Services Required    Number of Visits Requested:   1  . Ambulatory Referral to Physical Therapy    Referral Priority:   Routine    Referral Type:   Physical Medicine    Referral Reason:   Specialty Services Required    Requested Specialty:   Physical Therapy    Number of Visits Requested:   1  . Ambulatory Referral to Social Work    Referral Priority:   Routine    Referral Type:   Consultation    Referral Reason:   Specialty Services Required    Number of Visits Requested:   1    All questions were answered. The patient knows to call the clinic with any problems, questions or concerns. I spent 55 minutes counseling the patient face to face. The total time spent in the appointment was 60 minutes  and more than 50% was on counseling.     Heath Lark, MD 02/26/16 1:49 PM

## 2016-02-26 NOTE — Assessment & Plan Note (Signed)
I discussed with the patient the rationale for concurrent chemoradiation therapy. In preparation for treatment, he would need to obtain dental clearance, multidisciplinary clinic to see dietitian, speech and language therapist and physical therapy along with chemotherapy education class I discussed with the patient the rationale of placing port and feeding tube up front before treatment. We will also obtain baseline hearing test. I recommend concurrent chemoradiation therapy with high-dose cisplatin on days 1, 22 and 43. I will get the ENT navigator to coordinate appointments and I will see him back prior to start date of treatment

## 2016-02-26 NOTE — Assessment & Plan Note (Signed)
He will be referred to speech therapist for swallow assessment and dietitian. Recommend high-calorie diet for now to avoid further weight loss

## 2016-02-26 NOTE — Assessment & Plan Note (Signed)
This is due to untreated cancer. Recommend avoid NSAID and aspirin

## 2016-02-27 NOTE — Progress Notes (Addendum)
Oncology Nurse Navigator Documentation  Met with Mr. Scharnhorst during initial consult with Dr. Alvy Bimler.  He was unaccompanied. 1. Further introduced myself as his Navigator, explained my role as a member of the Care Team. 2. Provided New Patient Information packet:  Contact information for physician, this navigator, other members of the Care Team  Advance Directive information ([place] blue pamphlet with LCSW insert), provided [facility] AD  Fall Prevention Patient Safety Plan  Appointment [place] sheet  [place] campus map with highlight of [place] 3. Provided and discussed educational handouts for PEG and PAC. 4. He understands I will coordinate baseline audiology test [facility]. 5. Showed him location of Dr. Ritta Slot office and [facility] as reference for future appts, including arrival procedure for these appts.   6. He verbalized understanding of information provided. I encouraged him to call with questions/concerns.  Gayleen Orem, RN, BSN, [place] at [place] [phone]

## 2016-02-27 NOTE — Progress Notes (Signed)
Oncology Nurse Navigator Documentation  Met with Todd [name] during initial consult with Dr. Isidore Moos.  He had just completed his appt with Dr. Alvy Bimler.    1. Provided introductory explanation of radiation treatment including SIM planning and purpose of Aquaplast head and shoulder mask, showed example.   2. Provided and discussed education handouts for PEG and PAC.  3. He understands scheduling of CT SIM hinges on need for extractions per his visit with Dr. Tommie Raymond on Thursday. 4. Provided a tour of SIM and Tomo/LINAC areas, explained treatment and arrival procedures. 5. I encouraged him call me with questions/concerns as treatments/procedures begin.  He  verbalized understanding of information provided.    Gayleen Orem, RN, BSN, [place] at [place] [phone]

## 2016-02-27 NOTE — Addendum Note (Signed)
Encounter addended by: Eppie Gibson, MD on: 02/27/2016  7:47 AM<BR>    Actions taken: Sign clinical note

## 2016-02-28 ENCOUNTER — Ambulatory Visit (HOSPITAL_COMMUNITY): Payer: Self-pay | Admitting: Dentistry

## 2016-02-28 ENCOUNTER — Telehealth: Payer: Self-pay | Admitting: *Deleted

## 2016-02-28 ENCOUNTER — Encounter (HOSPITAL_COMMUNITY): Payer: Self-pay | Admitting: Dentistry

## 2016-02-28 VITALS — BP 129/69 | HR 65 | Temp 98.0°F

## 2016-02-28 DIAGNOSIS — M270 Developmental disorders of jaws: Secondary | ICD-10-CM

## 2016-02-28 DIAGNOSIS — C099 Malignant neoplasm of tonsil, unspecified: Secondary | ICD-10-CM

## 2016-02-28 DIAGNOSIS — K011 Impacted teeth: Secondary | ICD-10-CM | POA: Diagnosis not present

## 2016-02-28 DIAGNOSIS — K06010 Localized gingival recession, unspecified: Secondary | ICD-10-CM

## 2016-02-28 DIAGNOSIS — K0530 Chronic periodontitis, unspecified: Secondary | ICD-10-CM

## 2016-02-28 DIAGNOSIS — Z01818 Encounter for other preprocedural examination: Secondary | ICD-10-CM

## 2016-02-28 DIAGNOSIS — K08409 Partial loss of teeth, unspecified cause, unspecified class: Secondary | ICD-10-CM

## 2016-02-28 DIAGNOSIS — K036 Deposits [accretions] on teeth: Secondary | ICD-10-CM

## 2016-02-28 DIAGNOSIS — K031 Abrasion of teeth: Secondary | ICD-10-CM

## 2016-02-28 MED ORDER — SODIUM FLUORIDE 1.1 % DT GEL: DENTAL | 99 refills | Status: DC

## 2016-02-28 NOTE — Progress Notes (Signed)
DENTAL CONSULTATION  Date of Consultation:  02/28/2016 Patient Name:   Todd [name] Date of Birth:   Dec 31, 1960 Medical Record Number: [id number]  VITALS: BP 129/69 (BP Location: Left Arm)   Pulse 65   Temp 98 F (36.7 C) (Oral)   CHIEF COMPLAINT: Patient referred by Dr. Isidore Moos for a dental consultation.  HPI: Todd [name] is a 55 year old male recently diagnosed with squamous cell carcinoma of the left tonsil. Patient with anticipated chemoradiation therapy. Patient now seen as part of a medically necessary pre-chemoradiation therapy dental protocol examination.  Patient currently denies acute toothaches, swellings, or abscesses. Patient was last seen in April of 2016 for an exam and cleaning. This was with a Dr. Lilly Cove in [place]. Patient had been seen on an every 6 month basis prior to moving to [place]. Patient is in need of a new primary dentist in [place].  Patient does have a history of orthodontic therapy that started at the age of 69. Patient had multiple dental extractions at that time to facilitate the orthodontic therapy. Patient denies ever having had his lower wisdom teeth extracted. Patient still has his upper impacted wisdom teeth. Patient denies being afraid of the dentist.  PROBLEM LIST: Patient Active Problem List   Diagnosis Date Noted  . Carcinoma of tonsillar fossa ([place]) 02/26/2016    Priority: High  . Hemoptysis 02/26/2016  . Weight loss 02/26/2016  . Tonsil cancer ([place]) 02/21/2016    PMH: Past Medical History:  Diagnosis Date  . Arthritis   . Cancer ([place])    Tonsil Cancer  . Hyperlipidemia   . Hypertension   . Hypothyroidism     PSH: History reviewed. No pertinent surgical history.  ALLERGIES: No Known Allergies  MEDICATIONS: Current Outpatient Prescriptions  Medication Sig Dispense Refill  . acetaminophen (TYLENOL) 650 MG CR tablet Take 1,300 mg by mouth.    [name] aspirin EC 81 MG tablet Take by mouth.    . fenofibrate (TRICOR)  145 MG tablet     . GLUCOSAMINE SULFATE PO Take by mouth.    . levothyroxine (SYNTHROID, LEVOTHROID) 50 MCG tablet     . losartan (COZAAR) 100 MG tablet     . Multiple Vitamin (MULTIVITAMIN) capsule Take by mouth.     No current facility-administered medications for this visit.     LABS: No results found for: WBC, HGB, HCT, MCV, PLT No results found for: NA, K, CL, CO2, GLUCOSE, BUN, CREATININE, CALCIUM, GFRNONAA, GFRAA No results found for: INR, PROTIME No results found for: PTT  SOCIAL HISTORY: Social History   Social History  . Marital status: Married    Spouse name: Jenny Reichmann  . Number of children: 3  . Years of education: N/A   Occupational History  . sales rep     [employer]   Social History Main Topics  . Smoking status: Former Research scientist (life sciences)  . Smokeless tobacco: Never Used  . Alcohol use No  . Drug use: No  . Sexual activity: Not on file   Other Topics Concern  . Not on file   Social History Narrative   02/28/2016   Patient is divorced and remarried. Patient had 2 daughters with his first wife and one daughter passed away due to SIDS. Patient has 2 stepchildren from his recent marriage. There ages are 18 and 47.   Patient smokes cigarettes from the ages of [age]. None since then. Patient never chewed tobacco. Patient with rare use of alcohol. Patient does not use other illicit  drugs.         FAMILY HISTORY: Family History  Problem Relation Age of Onset  . Cancer Maternal Grandmother     unknown ca  . Dementia Father   . Diabetes Father     REVIEW OF SYSTEMS: Reviewed with patient as per History of present Illness. Psych: Patient denies dental phobia.  DENTAL HISTORY: CHIEF COMPLAINT: Patient referred by Dr. Isidore Moos for a dental consultation.  HPI: Todd [name] is a 55 year old male recently diagnosed with squamous cell carcinoma of the left tonsil. Patient with anticipated chemoradiation therapy. Patient now seen as part of a medically necessary  pre-chemoradiation therapy dental protocol examination.  Patient currently denies acute toothaches, swellings, or abscesses. Patient was last seen in April of 2016 for an exam and cleaning. This was with a Dr. Lilly Cove in [place]. Patient had been seen on an every 6 month basis prior to moving to [place]. Patient is in need of a new primary dentist in [place].  Patient does have a history of orthodontic therapy that started at the age of 62. Patient had multiple dental extractions at that time to facilitate the orthodontic therapy. Patient denies ever having had his lower wisdom teeth extracted. Patient still has his upper impacted wisdom teeth. Patient denies being afraid of the dentist.   DENTAL EXAMINATION: GENERAL: The patient is a well-developed, well-nourished male in no acute distress. HEAD AND NECK: Patient has left neck lymphadenopathy. I do not palpate any right neck lymphadenopathy. The patient has some tenderness of the Left TMJ area with palpation. Patient does have a history of having his "jaw locked open" in July 2017. Patient was able to eventually close his mouth without further treatment. Patient denies ever having had any occlusal splint therapy. INTRAORAL EXAM:  Patient has normal saliva. Patient has bilateral mandibular lingual multilobular tori. DENTITION: Patient is missing tooth numbers  11, 17, 23, and 32.  The space has closed between tooth numbers 10-12 and 22-24 after Orthodontic therapy. Patient has impacted wisdom teeth numbers 1 and 16. PERIODONTAL: Patient has chronic periodontitis with minimal plaque and calculus accumulations, selective areas of gingival recession and no significant tooth mobility. There is incipient bone loss noted. DENTAL CARIES/SUBOPTIMAL RESTORATIONS: No dental caries noted. Multiple flexure lesions are noted. ENDODONTIC: The patient denies acute pulpitis symptoms. No obvious periapical radiolucencies noted. CROWN AND BRIDGE: No crowns  noted. PROSTHODONTIC:No dentures. OCCLUSION: Patient has a stable occlusion. Patient does have a history of orthodontic therapy at the age of 66.  RADIOGRAPHIC INTERPRETATION: Orthopantogram was taken and supplemented with a full series of dental radiographs. There are missing tooth numbers 11, 17, 23, and 32. Tooth numbers 1 and 16 are full bony impactions. There is incipient bone loss noted. There are multiple porcelain restorations noted. No obvious periapical radiolucencies are noted. Bilateral maxillary sinuses are noted and appear to be well aerated. Trabecular bone pattern appears to be normal.   ASSESSMENTS: 1. Squamous cell carcinoma of the left tonsil 2. Pre-chemoradiation therapy dental protocol 3. Chronic periodontitis with incipient bone loss 4. Selective areas of gingival recession 5. Accretions-minimal 6. Multiple missing teeth 7. Impacted tooth numbers 1 and 16 8. History of orthodontic therapy 9. Multiple flexure lesions 10. Bilateral mandibular lingual tori 11. Stable occlusion   PLAN/RECOMMENDATIONS: 1. I discussed the risks, benefits, and complications of various treatment options with the patient in relationship to his medical and dental conditions, dysphagia chemoradiation therapy, and chemoradiation therapy side effects to include xerostomia, radiation caries, trismus,  mucositis, taste changes, gum and jawbone changes, and risk for infection and osteoradionecrosis. We discussed various treatment options to include no treatment, extraction of impacted tooth numbers 1 and 16 with an oral surgeon, evaluation for extraction of additional teeth in the primary field radiation therapy, pre-prosthetic surgery as indicated, periodontal therapy, dental restorations, root canal therapy, crown and bridge therapy, implant therapy, and replacement of missing teeth as indicated. The patient currently wishes to proceed with referral to an oral surgeon for second opinion concerning  extractions of teeth prior to start of chemoradiation therapy. This has been scheduled with Dr. Frederik Schmidt for Tuesday, 03/04/2016 at 3:15 PM. Patient will also be referred to Dr. Delphia Grates for his primary dental care needs to include possible initial periodontal therapy prior to start of chemoradiation therapy as time and space permits in the patient's schedule. Patient did agree to proceed with impressions today for the future fabrication of fluoride trays. No scatter protection devices will be needed. A prescription for FluoriSHIELD sodium fluoride was sent to [facility] with refills for one year with the normal directions for use of the fluoride trays.  2. Discussion of findings with medical team and coordination of future medical and dental care as needed.  I spent in excess of  120 minutes during the conduct of this consultation and >50% of this time involved direct face-to-face encounter for counseling and/or coordination of the patient's care.    Lenn Cal, DDS

## 2016-02-28 NOTE — Patient Instructions (Signed)

## 2016-03-03 NOTE — Telephone Encounter (Signed)
Oncology Nurse Navigator Documentation  Received email from Golden Hurter, [facility] ENT Audiology Department informing Mr. Todd [name]'s pre-chemo  baseline audiology exam is scheduled for 03/04/16 at 1pm.  Dr. Alvy Bimler informed.  Gayleen Orem, RN, BSN, [place] at [place] [phone]

## 2016-03-04 ENCOUNTER — Encounter: Payer: Self-pay | Admitting: *Deleted

## 2016-03-04 ENCOUNTER — Ambulatory Visit: Payer: BLUE CROSS/BLUE SHIELD | Attending: Hematology and Oncology

## 2016-03-04 ENCOUNTER — Ambulatory Visit
Admission: RE | Admit: 2016-03-04 | Discharge: 2016-03-04 | Disposition: A | Discharge status: Home / Self Care | Payer: BLUE CROSS/BLUE SHIELD | Source: Ambulatory Visit | Attending: Radiation Oncology | Admitting: Radiation Oncology

## 2016-03-04 ENCOUNTER — Ambulatory Visit: Payer: BLUE CROSS/BLUE SHIELD | Admitting: Physical Therapy

## 2016-03-04 ENCOUNTER — Ambulatory Visit: Payer: BLUE CROSS/BLUE SHIELD | Admitting: Nutrition

## 2016-03-04 VITALS — BP 125/77 | HR 67 | Temp 99.0°F | Ht 69.0 in | Wt 196.8 lb

## 2016-03-04 DIAGNOSIS — R1310 Dysphagia, unspecified: Secondary | ICD-10-CM | POA: Diagnosis not present

## 2016-03-04 DIAGNOSIS — M542 Cervicalgia: Secondary | ICD-10-CM

## 2016-03-04 DIAGNOSIS — R29898 Other symptoms and signs involving the musculoskeletal system: Secondary | ICD-10-CM

## 2016-03-04 DIAGNOSIS — H9042 Sensorineural hearing loss, unilateral, left ear, with unrestricted hearing on the contralateral side: Secondary | ICD-10-CM | POA: Diagnosis not present

## 2016-03-04 DIAGNOSIS — C090 Malignant neoplasm of tonsillar fossa: Secondary | ICD-10-CM

## 2016-03-04 NOTE — Progress Notes (Signed)
Patient was seen in head and neck clinic.  55 year old male diagnosed with Tonsil cancer to receive chemoradiation therapy.  PMH includes hypothyroidism, HTN, and HLD. No history of ETOH, Tobacco, HPV negative  Medications include Synthroid, MVI  Labs were reviewed.  Height: 69 inches. Weight: 196.8 pounds. UBW: 195 pounds. BMI: 29.06  Patient will have several teeth extracted prior to treatment. He is scheduled to have a feeding tube placed. Reports he has been consuming Equate Plus three to four times daily to promote weight gain.  Nutrition Diagnosis: Predicted suboptimal energy intake related to tonsil cancer as evidenced by the history or presence of a condition for which research shows an increased incidence of suboptimal energy intake.  Intervention: Educated patient to consume small, frequent high calorie, high protein meals and snacks. Provided fact sheet on increasing calories and protein. Encouraged minimal weight loss. Education patient on avoiding constipation. Education provided on strategies for eating with nausea and vomiting. Questions answered. Teach back method used. Contact information provided.  Monitoring, Evaluation, Goals: Patient will tolerate adequate oral intake plus TF to minimize loss of lean body mass.  Next Visit: To be scheduled weekly with treatment.  **Disclaimer: This note was dictated with voice recognition software. Similar sounding words can inadvertently be transcribed and this note may contain transcription errors which may not have been corrected upon publication of note.**

## 2016-03-04 NOTE — Progress Notes (Signed)
Head & Neck Multidisciplinary Clinic Clinical Social Work  Clinical Social Work met with patien at head & neck multidisciplinary clinic to offer support and assess for psychosocial needs.  Mr. Todd [name] was alone for today's visit.  Mr. Todd [name] indicated his wife is his primary support.  He reported he is ready to begin treatment, but still has extractions to complete before starting.  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  He does have short term disability benefits through his employer.   The patient shared he has minimal anxiety at this point.  CSW explored postive coping skills patient already utilizes during anxious/stressful times.  Mr. Todd [name] shared the most difficult time in his life was when his daughter passed away. He reported he relied heavily on his faith during this time and continues to utilize prayer and faith in God as a support through this life.  He shares this is why he feels at peace with this cancer experience.  CSW affirmed patient's positive coping skill and spirituality support.  CSW and patient explored other skills such as counseling, deep breathing, meditation- patient reports he has been listening to cancer motivational speaker CD and has been very uplifting for him.  Clinical Social Work briefly discussed Clinical Social Work role and [employer] support programs/services.  Clinical Social Work encouraged patient to call with any additional questions or concerns.   Todd [name], MSW, LCSW, OSW-C Clinical Social Worker [facility] [phone]

## 2016-03-04 NOTE — Therapy (Signed)
[facility] [address] [place], [place], [address] Phone: [phone]   Fax:  [phone]  Speech Language Pathology Evaluation  Patient Details  Name: Todd [name] MRN: [id number] Date of Birth: 1961-01-17 Referring Provider: Eppie Gibson MD  Encounter Date: 03/04/2016      End of Session - 03/04/16 1108    Visit Number 1   Number of Visits 6   Date for SLP Re-Evaluation 09/05/16   SLP Start Time 0825   SLP Stop Time  0910   SLP Time Calculation (min) 45 min   Activity Tolerance Patient tolerated treatment well      Past Medical History:  Diagnosis Date  . Arthritis   . Cancer ([place])    Tonsil Cancer  . Hyperlipidemia   . Hypertension   . Hypothyroidism     No past surgical history on file.  There were no vitals filed for this visit.      Subjective Assessment - 03/04/16 0829    Subjective Pt reported that more solid, sticky items are difficult to pass through nasopharyx/posterior oral cavity, he requires liquid wash and smaller bites, and has avoided these type of foods.   Currently in Pain? No/denies            SLP Evaluation [facility] - 03/04/16 [id number]      SLP Visit Information   SLP Received On 03/04/16   Referring Provider Eppie Gibson MD   Medical Diagnosis Tonsilar CA     General Information   HPI Stringy, more dense or sticky foods are more difficult to swallow for at least 4 weeks.      Cognition   Overall Cognitive Status Within Functional Limits for tasks assessed     Auditory Comprehension   Overall Auditory Comprehension Appears within functional limits for tasks assessed     Verbal Expression   Overall Verbal Expression Appears within functional limits for tasks assessed     Oral Motor/Sensory Function   Overall Oral Motor/Sensory Function Impaired   Labial ROM Within Functional Limits   Labial Strength Within Functional Limits   Lingual ROM Reduced left;Other (Comment)   reduced-lower bil, during lingual sweep   Lingual Symmetry --  deviates rt   Lingual Strength Reduced   Lingual Coordination WFL   Overall Oral Motor/Sensory Function Mildly reduced lingual ROM noted, esp on lt side, pt reports his speech has changed minimally "It's like my tongue is thick." SLP educated pt re: slight reduced ROM, but WNL intelligibility/speech production in this trained clinician's opinion.     Motor Speech   Overall Motor Speech Appears within functional limits for tasks assessed   Resonance --  mildly impaired - does not reduce intelligbility   Articulation Impaired   Level of Impairment Word   Intelligibility Intelligible     Pt currently tolerates soft diet minus sticky/dense foods and thin liquids. Pt with adequate dentition, goes for oral surgery consult in next week or so, pt to have up to 4 dental extractions. Start date of rad/chemo to be determined after that surgery. PEG and port placement pending as well.  POs: Pt politely declined [place] sandwich due to bread. He ate cheese stick and drank H2O without overt s/s aspiration. Thyroid elevation appeared slightly decreased, and SLP asked pt to swallow with effort. Thyroid cartilage with incr'd elevation when pt did this. Swallows appeared timely. Oral residue noted as WNL. Pt's swallow deemed WNL/WFL at this time.   Because data states the risk for  dysphagia during and after radiation treatment is high due to undergoing radiation tx, SLP taught pt about the possibility of reduced/limited ability for PO intake during rad tx. SLP encouraged pt to continue swallowing POs as far into rad tx as possible, even ingesting POs and/or completing HEP shortly after administration of pain meds.   SLP educated pt re: changes to swallowing musculature after rad tx, and why adherence to dysphagia HEP provided today and PO consumption was necessary to inhibit muscular disuse atrophy and to reduce muscle fibrosis following rad tx. Pt  demonstrated understanding of these things to SLP.    SLP then developed a HEP for pt and pt was instructed how to perform exercises involving lingual, vocal, and pharyngeal strengthening. SLP performed each exercise and pt return demonstrated each exercise. SLP ensured pt performance was correct prior to moving on to next exercise. Pt was instructed to complete this program once a day until radiation begins, then 2-3 times a day until 6 months after their last rad tx, then x2 a week after that.                    SLP Education - 03/04/16 1107    Education provided Yes   Education Details HEP, late effects head/neck CA,    Person(s) Educated Patient   Methods Explanation;Demonstration;Handout;Verbal cues   Comprehension Verbalized understanding;Returned demonstration;Verbal cues required          SLP Short Term Goals - 03/04/16 1113      SLP SHORT TERM GOAL #1   Title pt will complete HEP with rare min A   Time 3   Period --  visits   Status New     SLP SHORT TERM GOAL #2   Title pt will demo knowledge of 3 overt s/s aspiration PNA with modified indpendnece   Time 3   Period --  visits   Status New     SLP SHORT TERM GOAL #3   Title pt will tell SLP how a food journal can assist in return to more normal PO diet   Time 3   Period --  visits   Status New          SLP Long Term Goals - 03/04/16 1115      SLP LONG TERM GOAL #1   Title pt will complete HEP with modified indpendence over 2 therapy visits   Time 6   Period --  visits   Status New     SLP LONG TERM GOAL #2   Title pt will tell SLP when to switch frequency of HEP from 2-3/day to x2/week   Time 6   Period --  visits   Status New          Plan - 03/04/16 1109    Clinical Impression Statement Pt presents with no overt s/s aspiration during POs today, however pt refused sandwich, and by his description, would have likely caused more difficulty for him today. SLP assumes pt's tumor is  tethering the lingual musculature to some degree given pt's report of "tongue is thick", lingual deviation to rt, and other minor oral-motor differences with lingual musculature. Pt is without overt s/s aspiration PNA with aforementioned difficulties for approx 4-5 weeks. Pt would benefit from skilled ST to continue to assess pt for safety with POs, as well as SLP assessing for proper completion of HEP.   Speech Therapy Frequency --  approx once every 4 weeks   Duration --  6 visits   Treatment/Interventions Aspiration precaution training;Pharyngeal strengthening exercises;Compensatory techniques;SLP instruction and feedback;Patient/family education;Trials of upgraded texture/liquids  any and/or all may be used in ST sessions   Potential to Achieve Goals Good      Patient will benefit from skilled therapeutic intervention in order to improve the following deficits and impairments:   Dysphagia, unspecified type    Problem List Patient Active Problem List   Diagnosis Date Noted  . Hemoptysis 02/26/2016  . Weight loss 02/26/2016  . Carcinoma of tonsillar fossa ([place]) 02/26/2016  . Tonsil cancer ([place]) 02/21/2016    [facility] ,[place], [place]  03/04/2016, 11:17 AM  [place] [address] [place], [place], [address] Phone: [phone]   Fax:  [phone]  Name: Todd [name] MRN: [id number] Date of Birth: [birth year]

## 2016-03-04 NOTE — Patient Instructions (Signed)
SWALLOWING EXERCISES Do these once/day until radiation starts, then as directed below until six months after your last day of  radiation, then 2-3 times per week afterwards  1. Effortful Swallows - Press your tongue against the roof of your mouth for 3 seconds, then squeeze          the muscles in your neck while you swallow your saliva or a sip of water - Repeat 15-20 times, 2-3 times a day, and use whenever you eat or drink  2. Masako Swallow - swallow with your tongue sticking out - Stick tongue out past your teeth and gently bite tongue with your teeth - Swallow, while holding your tongue with your teeth - Repeat 15-20 times, 2-3 times a day *use a wet spoon if your mouth gets dry*  3. Shaker Exercise - head lift - Lie flat on your back in your bed or on a couch without pillows - Raise your head and look at your feet - KEEP YOUR SHOULDERS DOWN - HOLD FOR 45-60 SECONDS, then lower your head back down - Repeat 3 times, 2-3 times a day  4. Mendelsohn Maneuver - "half swallow" exercise - Start to swallow, and keep your Adam's apple up by squeezing hard with the            muscles of the throat - Hold the squeeze for 5-7 seconds and then relax - Repeat 15-20 times, 2-3 times a day *use a wet spoon if your mouth gets dry*  5. Breath Hold - Say "HUH!" loudly, then hold your breath for 3 seconds at your voice box - Repeat 20 times, 2-3 times a day  6. Chin pushback - Open your mouth  - Place your fist UNDER your chin near your neck, and push back with your fist for 5 seconds - Repeat 10 times, 2-3 times a day

## 2016-03-04 NOTE — Progress Notes (Signed)
Oncology Nurse Navigator Documentation  Met with Mr. Lambson during H&N [place].  He was unaccompanied.  Provided verbal and written overview of [place], the clinicians who will be seeing him, encouraged him to ask questions during his time with them.  He was seen by Nutrition, SLP, PT and SW.  Spoke with him at end of [facility], addressed questions.  He accurately repeated information learned from each clinician. Using  PEG teaching device   and Teach Back, provided education for PEG use, including: hand hygiene, gravity bolus administration of daily water flushes, nutritional supplement, fluids and medications.  He provided correct return demonstration of gravity administration of water.  He understands I will provide education regarding PEG care and dressing change at a later date.  He understands I will be available for ongoing PEG educational support. I encouraged him to contact me questions/concerns.  Gayleen Orem, RN, BSN, [place] at [place] [phone]
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
## 2016-03-06 ENCOUNTER — Other Ambulatory Visit: Payer: Self-pay | Admitting: Radiation Oncology

## 2016-03-06 ENCOUNTER — Other Ambulatory Visit: Payer: Self-pay | Admitting: Hematology and Oncology

## 2016-03-06 ENCOUNTER — Telehealth: Payer: Self-pay | Admitting: *Deleted

## 2016-03-06 DIAGNOSIS — C090 Malignant neoplasm of tonsillar fossa: Secondary | ICD-10-CM

## 2016-03-06 DIAGNOSIS — C099 Malignant neoplasm of tonsil, unspecified: Secondary | ICD-10-CM

## 2016-03-06 NOTE — Telephone Encounter (Signed)
Called patient to inform of lab on 03-13-16 @ 8:15 am, lvm for a return call

## 2016-03-06 NOTE — Progress Notes (Signed)
START ON PATHWAY REGIMEN - Head and Neck  HNOS300: Cisplatin 100 mg/m2 q21 Days x 3 Cycles with Concurrent Radiation   A cycle is every 21 days:     Cisplatin (Platinol(R)) 100 mg/m2 in 500 mL NS IV over 2 hours.  **Prehydrate and consider post-hydration.**** Dose Mod: None  **Always confirm dose/schedule in your pharmacy ordering system**    Patient Characteristics: Oropharynx, Stage III, IVA, IVB, HPV Negative/Unknown; Unresectable Disease Classification: Oropharynx HPV Status: Negative (-) Current Disease Status: No Distant Mets or Local Recurrence AJCC M Stage: 0 AJCC N Stage: 3 AJCC T Stage: 3 AJCC Stage Grouping: IVB  Intent of Therapy: Curative Intent, Discussed with Patient

## 2016-03-06 NOTE — Telephone Encounter (Signed)
Oncology Nurse Navigator Documentation  Spoke with [name] following several conversations with her and internal communications with [facility] IR and Dr. Alvy Bimler re upcoming pre-treatment appointments.  She understands:  11/2 - 0815 lab, 0915 IV Start, 1000 CT SIM (confirmed)  11/6 - 1000 Chemo Education (confirmation pending)  11/9 - 0930 Pre-procedure for PAC/PEG, 1130 procedures; 1400 appt Dr. Alvy Bimler (confirmation pending) She indicated these appointment times are workable with his schedule. She understands I will contact her with appointment confirmations.  Gayleen Orem, RN, BSN, [place] at [place] [phone]

## 2016-03-06 NOTE — Telephone Encounter (Signed)
CALLED PATIENT TO INFORM OF LAB ON 03-13-16 @ 8:15 AM

## 2016-03-07 ENCOUNTER — Other Ambulatory Visit: Payer: Self-pay | Admitting: Hematology and Oncology

## 2016-03-07 ENCOUNTER — Telehealth: Payer: Self-pay | Admitting: *Deleted

## 2016-03-07 DIAGNOSIS — C099 Malignant neoplasm of tonsil, unspecified: Secondary | ICD-10-CM | POA: Diagnosis not present

## 2016-03-07 NOTE — Telephone Encounter (Signed)
Oncology Nurse Navigator Documentation  LVMM for patient's wife confirming 11/6 10:00 [place], 1445 Est Pt Dr. Alvy Bimler; 11/9 11:30 arrival at [facility] for 1330 PAC/PEG placement.  Gayleen Orem, RN, BSN, [place] at [place] [phone]

## 2016-03-10 ENCOUNTER — Telehealth: Payer: Self-pay | Admitting: *Deleted

## 2016-03-10 NOTE — Telephone Encounter (Signed)
Oncology Nurse Navigator Documentation  Patient's wife call to confirm upcoming appts. I provided information, encouraged her to call with future questions.  Gayleen Orem, RN, BSN, [place] at [place]

## 2016-03-12 ENCOUNTER — Telehealth: Payer: Self-pay | Admitting: Hematology and Oncology

## 2016-03-12 ENCOUNTER — Telehealth: Payer: Self-pay | Admitting: *Deleted

## 2016-03-12 ENCOUNTER — Encounter: Payer: Self-pay | Admitting: *Deleted

## 2016-03-12 NOTE — Telephone Encounter (Signed)
Per LOS I have scheduled appts and notified the scheruler 

## 2016-03-12 NOTE — Telephone Encounter (Signed)
lvm to inform pt of 11/6 appts date/time per LOS

## 2016-03-13 ENCOUNTER — Ambulatory Visit
Admission: RE | Admit: 2016-03-13 | Discharge: 2016-03-13 | Disposition: A | Discharge status: Home / Self Care | Payer: BLUE CROSS/BLUE SHIELD | Source: Ambulatory Visit | Attending: Radiation Oncology | Admitting: Radiation Oncology

## 2016-03-13 VITALS — BP 120/82 | HR 59 | Temp 98.0°F | Ht 69.0 in

## 2016-03-13 DIAGNOSIS — E039 Hypothyroidism, unspecified: Secondary | ICD-10-CM | POA: Diagnosis not present

## 2016-03-13 DIAGNOSIS — C099 Malignant neoplasm of tonsil, unspecified: Secondary | ICD-10-CM

## 2016-03-13 DIAGNOSIS — E785 Hyperlipidemia, unspecified: Secondary | ICD-10-CM | POA: Diagnosis not present

## 2016-03-13 DIAGNOSIS — Z79899 Other long term (current) drug therapy: Secondary | ICD-10-CM | POA: Diagnosis not present

## 2016-03-13 DIAGNOSIS — Z7982 Long term (current) use of aspirin: Secondary | ICD-10-CM | POA: Diagnosis not present

## 2016-03-13 DIAGNOSIS — M1990 Unspecified osteoarthritis, unspecified site: Secondary | ICD-10-CM | POA: Diagnosis not present

## 2016-03-13 DIAGNOSIS — Z510 Encounter for antineoplastic radiation therapy: Secondary | ICD-10-CM | POA: Diagnosis not present

## 2016-03-13 DIAGNOSIS — R1310 Dysphagia, unspecified: Secondary | ICD-10-CM | POA: Diagnosis not present

## 2016-03-13 DIAGNOSIS — I10 Essential (primary) hypertension: Secondary | ICD-10-CM | POA: Diagnosis not present

## 2016-03-13 DIAGNOSIS — Z87891 Personal history of nicotine dependence: Secondary | ICD-10-CM | POA: Diagnosis not present

## 2016-03-13 DIAGNOSIS — C090 Malignant neoplasm of tonsillar fossa: Secondary | ICD-10-CM

## 2016-03-13 DIAGNOSIS — Z809 Family history of malignant neoplasm, unspecified: Secondary | ICD-10-CM | POA: Diagnosis not present

## 2016-03-13 LAB — BUN AND CREATININE (CC13)
BUN: 16.6 mg/dL (ref 7.0–26.0)
Creatinine: 1.1 mg/dL (ref 0.7–1.3)
EGFR: 73 mL/min/{1.73_m2} — ABNORMAL LOW (ref >90)

## 2016-03-13 MED ORDER — SODIUM CHLORIDE 0.9% FLUSH
10.0000 mL | Freq: Once | INTRAVENOUS | Status: AC
  Administered 2016-03-13: 10 mL via INTRAVENOUS

## 2016-03-13 NOTE — Progress Notes (Addendum)
Does patient have an allergy to IV contrast dye?: No.   Has patient ever received premedication for IV contrast dye?: No.   Does patient take metformin?: No.  If patient does take metformin when was the last dose:  Not diabetic  Date of lab work: 11/02/217 BUN: 16.6 CR: 1.1 Patient gave name and dob as identification  IV site: RAC, 22g  1 inch catheter needle x 1 attempt, excellent blood return, patient tolerated well, called Aaron Edelman RT therapist, patient ready to have ct sim  BP 120/82 (BP Location: Left Arm, Patient Position: Sitting, Cuff Size: Large)   Pulse (!) 59   Temp 98 F (36.7 C) (Oral)   Ht 5\' 9"  (1.753 m)   IV d/c, all tip of catheter intact, placed 2  2x2 gause and paper taped over site RAC, instructions given to hold arm bent 1-2 minutes, and leave dressing on for a few hours, gave band aid to cover site once dressing comes osff,patient gave verbal understanding 10:06 AM  10:05 AM

## 2016-03-14 ENCOUNTER — Telehealth: Payer: Self-pay | Admitting: *Deleted

## 2016-03-14 NOTE — Telephone Encounter (Signed)
Opened in error

## 2016-03-17 ENCOUNTER — Ambulatory Visit (HOSPITAL_COMMUNITY): Payer: Medicaid - Dental | Admitting: Dentistry

## 2016-03-17 ENCOUNTER — Other Ambulatory Visit: Payer: BLUE CROSS/BLUE SHIELD

## 2016-03-17 ENCOUNTER — Encounter (HOSPITAL_COMMUNITY): Payer: Self-pay | Admitting: Dentistry

## 2016-03-17 ENCOUNTER — Encounter: Payer: Self-pay | Admitting: Hematology and Oncology

## 2016-03-17 ENCOUNTER — Telehealth: Payer: Self-pay | Admitting: Hematology and Oncology

## 2016-03-17 ENCOUNTER — Ambulatory Visit (HOSPITAL_BASED_OUTPATIENT_CLINIC_OR_DEPARTMENT_OTHER): Payer: BLUE CROSS/BLUE SHIELD | Admitting: Hematology and Oncology

## 2016-03-17 VITALS — BP 138/82 | HR 66 | Temp 98.3°F

## 2016-03-17 VITALS — BP 139/77 | HR 77 | Temp 98.3°F | Resp 18 | Ht 69.0 in | Wt 193.0 lb

## 2016-03-17 DIAGNOSIS — R042 Hemoptysis: Secondary | ICD-10-CM

## 2016-03-17 DIAGNOSIS — Z463 Encounter for fitting and adjustment of dental prosthetic device: Secondary | ICD-10-CM | POA: Diagnosis not present

## 2016-03-17 DIAGNOSIS — Z01818 Encounter for other preprocedural examination: Secondary | ICD-10-CM

## 2016-03-17 DIAGNOSIS — K08409 Partial loss of teeth, unspecified cause, unspecified class: Secondary | ICD-10-CM | POA: Diagnosis not present

## 2016-03-17 DIAGNOSIS — K08199 Complete loss of teeth due to other specified cause, unspecified class: Secondary | ICD-10-CM

## 2016-03-17 DIAGNOSIS — C099 Malignant neoplasm of tonsil, unspecified: Secondary | ICD-10-CM | POA: Diagnosis not present

## 2016-03-17 MED ORDER — LIDOCAINE-PRILOCAINE 2.5-2.5 % EX CREA: TOPICAL_CREAM | CUTANEOUS | 3 refills | Status: DC

## 2016-03-17 MED ORDER — FENTANYL 25 MCG/HR TD PT72: 25.0000 ug | MEDICATED_PATCH | TRANSDERMAL | 0 refills | Status: DC

## 2016-03-17 MED ORDER — ONDANSETRON HCL 8 MG PO TABS: 8.0000 mg | ORAL_TABLET | Freq: Two times a day (BID) | ORAL | 1 refills | Status: DC | PRN

## 2016-03-17 MED ORDER — PROCHLORPERAZINE MALEATE 10 MG PO TABS: 10.0000 mg | ORAL_TABLET | Freq: Four times a day (QID) | ORAL | 1 refills | Status: DC | PRN

## 2016-03-17 MED ORDER — MORPHINE SULFATE (CONCENTRATE) 20 MG/ML PO SOLN: 10.0000 mg | ORAL | 0 refills | Status: DC | PRN

## 2016-03-17 MED FILL — MORPHINE SULF 100 MG/5 ML S: 100 | 20 days supply | Qty: 120 | Fill #0

## 2016-03-17 MED FILL — fentaNYL 25 MCG/HR PT72: 25 | 15 days supply | Qty: 5 | Fill #0

## 2016-03-17 MED FILL — FLUORISHIELD 1.1% GEL: 1.1 % | 30 days supply | Qty: 114 | Fill #0

## 2016-03-17 MED FILL — ONDANSETRON HCL 8 MG TABLET: 8 | 10 days supply | Qty: 21 | Fill #0

## 2016-03-17 MED FILL — LIDOCAINE-PRILOCAINE CREAM: 2.5-2.5 | 30 days supply | Qty: 30 | Fill #0

## 2016-03-17 MED FILL — PROCHLORPERAZINE 10 MG TAB: 10 | 7 days supply | Qty: 30 | Fill #0

## 2016-03-17 NOTE — Progress Notes (Signed)
Head and Neck Cancer Simulation, IMRT treatment planning, and Special treatment procedure note   Outpatient  Diagnosis:    ICD-9-CM ICD-10-CM   1. Carcinoma of tonsillar fossa (HCC) 146.1 C09.0     The patient was taken to the CT simulator and laid in the supine position on the table. An Aquaplast head and shoulder mask was custom fitted to the patient's anatomy. High-resolution CT axial imaging was obtained of the head and neck with contrast. I verified that the quality of the imaging is good for treatment planning. 1 Medically Necessary Treatment Device was fabricated and supervised by me: Aquaplast mask.   Treatment planning note I plan to treat the patient with IMRT. I plan to treat the patient's tumor and bilateral neck nodes. I plan to treat to a total dose of 70 Gray in 35  fractions. Dose calculation was ordered from dosimetry.  IMRT planning Note  IMRT is an important modality to deliver adequate dose to the patient's at risk tissues while sparing the patient's normal structures, including the: esophagus, parotid tissue, mandible, brain stem, spinal cord, oral cavity, brachial plexus.  This justifies the use of IMRT in the patient's treatment.   Special Treatment Procedure Note:  The patient will be receiving chemotherapy concurrently. Chemotherapy heightens the risk of side effects. I have considered this during the patient's treatment planning process and will monitor the patient accordingly for side effects on a weekly basis. Concurrent chemotherapy increases the complexity of this patient's treatment and therefore this constitutes a special treatment procedure.  -----------------------------------  Eppie Gibson, MD

## 2016-03-17 NOTE — Patient Instructions (Signed)
PLAN: 1. Follow-up with Dr. Benson Norway today for postop evaluation 2. Brush after meals and at bedtime. Floss at bedtime. 3. Use fluoride in the fluoride trays at bedtime as directed. 4. Use trismus exercises as directed on a daily basis. 5. Call for follow-up appointment in 2-3 weeks for oral examination during chemoradiation therapy as needed. 6. Return to clinic in late January 2018 for oral examination after chemoradiation therapy. Call if other questions or problems arise before then.   Lenn Cal, DDS

## 2016-03-17 NOTE — Assessment & Plan Note (Signed)
We discussed the role of chemotherapy. The intent is for cure.  We discussed some of the risks, benefits, side-effects of cisplatin. Some of the short term side-effects included, though not limited to, including weight loss, life threatening infections, risk of allergic reactions, need for transfusions of blood products, nausea, vomiting, change in bowel habits, loss of hair, admission to hospital for various reasons, and risks of death.   Long term side-effects are also discussed including risks of infertility, permanent damage to nerve function, hearing loss, chronic fatigue, kidney damage with possibility needing hemodialysis, and rare secondary malignancy including bone marrow disorders.  The patient is aware that the response rates discussed earlier is not guaranteed.  After a long discussion, patient made an informed decision to proceed with the prescribed plan of care and went ahead to sign the consent form today.   Patient education material was dispensed.  Treatment decision is based on NCCN guidelines, 100 mg/m2 every 3 weeks for total of 3 cycles with concurrent radiation treatment. References as follows:  Postoperative Concurrent Radiotherapy and Chemotherapy for High-Risk Squamous-Cell Carcinoma of the Head and Neck Ulice Dash S. Burt Knack, M.D., Lucille Passy. Georjean Mode, Ph.D., Arlene A. Jasper Loser, M.D., Blase Mess, M.D., Darrick Penna. Megan Salon, M.D., [place] Jonna Munro, M.D., Domingo Cocking. Lyndon Code, M.D., Sallye Lat, M.D., Luciano Cutter, M.D., Polly Cobia, M.D., Minerva Ends, M.D., Sandre Kitty, M.D., K.S. Mariane Duval, M.D., Trinidad Curet, M.D., Meryl Dare, M.D., and Danae Orleans. Fu, M.D. for the [facility] Group [employer] [facility] 2004; [phone] 6, 2004DOI: 10.1056/NEJMoa032646  Due to concern for risk of kidney injury, I will stop Cozaar The patient will call me if his systolic blood pressure continues to run high. In that case, I will prescribe amlodipine I  gave him prescription of anti-emetics, EMLA cream and pain medicine to take as needed I will see him on a weekly basis for supportive care

## 2016-03-17 NOTE — Assessment & Plan Note (Signed)
He denies further hemoptysis. Continue close monitoring

## 2016-03-17 NOTE — Progress Notes (Signed)
PROGRESS NOTE:  03/17/2016 Todd [name] [id number]  VITALS: BP 138/82 (BP Location: Left Arm)   Pulse 66   Temp 98.3 F (36.8 C) (Oral)   Todd [name] is status post extraction of tooth numbers 1, 15, 16, and 18 with Dr. Benson Norway on 03/07/2016. Patient also had a dental cleaning with his new primary dentist, Dr. Johnnye Sima, this morning. Patient now presents for evaluation of healing and insertion of upper lower fluoride trays.  SUBJECTIVE: Patient with minimal complaints from that dental extraction sites. Patient thinks that the sutures are all gone.  EXAM: There is no sign of infection, heme, or ooze. Sutures appear to be all gone. Extraction sites appear to be healing in by generalized primary closure. Patient has very good oral hygiene with minimal plaque noted consistent with dental cleaning this morning.  PROCEDURE: The patient was given a chlorhexidine gluconate rinse for 30 seconds. Patient was evaluated for sutures and none were noted.  Appliances were tried in and adjusted as needed. [place] Trismus device was fabricated at 37 mm 23 sticks. Postop instructions were provided and a written and verbal format concerning the use and care of appliances. All questions were answered.  ASSESSMENT: 1. Pre-chemoradiation therapy Protocol 2. Loss of teeth due to extraction  PLAN: 1. Follow-up with Dr. Benson Norway today for postop evaluation 2. Brush after meals and at bedtime. Floss at bedtime. 3. Use fluoride in the fluoride trays at bedtime as directed. 4. Use trismus exercises as directed on a daily basis. 5. Call for follow-up appointment in 2-3 weeks for oral examination during chemoradiation therapy as needed. 6. Return to clinic in late January 2018 for oral examination after chemoradiation therapy. Call if other questions or problems arise before then.   Lenn Cal, DDS

## 2016-03-17 NOTE — Progress Notes (Signed)
[facility] OFFICE PROGRESS NOTE  Patient Care Team: Haywood Pao, MD as PCP - General (Internal Medicine) Leota Sauers, RN as Oncology Nurse Navigator Eppie Gibson, MD as Attending Physician (Radiation Oncology) Heath Lark, MD as Consulting Physician (Hematology and Oncology) Karie Mainland, RD as Dietitian (Nutrition)  SUMMARY OF ONCOLOGIC HISTORY:   Tonsil cancer ([place])   02/07/2016 Imaging    Ct neck showed abnormal soft tissue within the left pharynx, extending from the left tonsillar pillar inferiorly to the level of the hyoid and filling the left vallecula. The appearance is most consistent with a neoplastic process. 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 Histologic sampling and direct visualization should be considered. Confluent adenopathy within the left cervical chain with areas of central hypoattenuation suggesting degrees of necrosis. This is most suggestive of metastatic spread. The areas of necrosis within the adenopathy combined with the above-described pharyngeal soft tissue mass are together suggestive of squamous cell carcinoma.      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 Borderline hypermetabolic small right station IIb lymph node. Several tiny lung nodules are observed, these are not hypermetabolic but are also below the threshold level for accurate characterization. Dedicated CT chest may be warranted to act as a baseline.       INTERVAL HISTORY: Please see below for problem oriented charting. He is seen today for chemotherapy consent He has minimum pain on his neck No recent hemoptysis He has  completed dental clearance and will proceed with placement of port and feeding tube and of the week and to start treatment next week  REVIEW OF SYSTEMS:   Constitutional: Denies fevers, chills or abnormal weight loss Eyes: Denies blurriness of vision Ears, nose, mouth, throat, and face: Denies mucositis or sore throat Respiratory: Denies cough, dyspnea or wheezes Cardiovascular: Denies palpitation, chest discomfort or lower extremity swelling Gastrointestinal:  Denies nausea, heartburn or change in bowel habits Skin: Denies abnormal skin rashes Lymphatics: Denies new lymphadenopathy or easy bruising Neurological:Denies numbness, tingling or new weaknesses Behavioral/Psych: Mood is stable, no new changes  All other systems were reviewed with the patient and are negative.  I have reviewed the past medical history, past surgical history, social history and family history with the patient and they are unchanged from previous note.  ALLERGIES:  has No Known Allergies.  MEDICATIONS:  Current Outpatient Prescriptions  Medication Sig Dispense Refill  . acetaminophen (TYLENOL) 650 MG CR tablet Take 1,300 mg by mouth.    . fenofibrate (TRICOR) 145 MG tablet     . GLUCOSAMINE SULFATE PO Take by mouth.    . levothyroxine (SYNTHROID, LEVOTHROID) 50 MCG tablet     . losartan (COZAAR) 100 MG tablet     . Multiple Vitamin (MULTIVITAMIN) capsule Take by mouth.    [name] aspirin EC 81 MG tablet Take by mouth.    . fentaNYL (DURAGESIC - DOSED MCG/HR) 25 MCG/HR patch Place 1 patch (25 mcg total) onto the skin every 3 (three) days. 5 patch 0  . HYDROcodone-acetaminophen (NORCO) 10-325 MG tablet TAKE 1 TABLET BY MOUTH EVERY 4-6 HOURS AS NEEDED FOR PAIN  0  .  lidocaine-prilocaine (EMLA) cream Apply to affected area once 30 g 3  . morphine (ROXANOL) 20 MG/ML concentrated solution Take 0.5 mLs (10 mg total) by mouth every 2 (two) hours as needed for severe pain. 120 mL 0  . ondansetron (ZOFRAN) 8 MG tablet Take 1  tablet (8 mg total) by mouth 2 (two) times daily as needed. Start on the third day after chemotherapy. 30 tablet 1  . prochlorperazine (COMPAZINE) 10 MG tablet Take 1 tablet (10 mg total) by mouth every 6 (six) hours as needed (Nausea or vomiting). 30 tablet 1  . sodium fluoride (FLUORISHIELD) 1.1 % GEL dental gel Instill one drop of gel per tooth space of fluoride tray. Place over teeth for 5 minutes. Remove. Spit out excess. Repeat nightly. (Patient not taking: Reported on 03/17/2016) 120 mL prn   No current facility-administered medications for this visit.     PHYSICAL EXAMINATION: ECOG PERFORMANCE STATUS: 1 - Symptomatic but completely ambulatory  Vitals:   03/17/16 1324  BP: 139/77  Pulse: 77  Resp: 18  Temp: 98.3 F (36.8 C)   Filed Weights   03/17/16 1324  Weight: 193 lb (87.5 kg)    GENERAL:alert, no distress and comfortable SKIN: skin color, texture, turgor are normal, no rashes or significant lesions EYES: normal, Conjunctiva are pink and non-injected, sclera clear OROPHARYNX:no exudate, no erythema and lips, buccal mucosa, and tongue normal  NECK:He has persistent left-sided lymph node swelling Musculoskeletal:no cyanosis of digits and no clubbing  NEURO: alert & oriented x 3 with fluent speech, no focal motor/sensory deficits  LABORATORY DATA:  I have reviewed the data as listed    Component Value Date/Time   BUN 16.6 03/13/2016 0835   CREATININE 1.1 03/13/2016 0835    No results found for: SPEP, UPEP  No results found for: WBC, NEUTROABS, HGB, HCT, MCV, PLT    Chemistry      Component Value Date/Time   BUN 16.6 03/13/2016 0835   CREATININE 1.1 03/13/2016 0835   No results found for: CALCIUM, ALKPHOS, AST, ALT, BILITOT    ASSESSMENT & PLAN:  Tonsil cancer ([place]) We discussed the role of chemotherapy. The intent is for cure.  We discussed some of the risks, benefits, side-effects of cisplatin. Some of the short term side-effects included, though not  limited to, including weight loss, life threatening infections, risk of allergic reactions, need for transfusions of blood products, nausea, vomiting, change in bowel habits, loss of hair, admission to hospital for various reasons, and risks of death.   Long term side-effects are also discussed including risks of infertility, permanent damage to nerve function, hearing loss, chronic fatigue, kidney damage with possibility needing hemodialysis, and rare secondary malignancy including bone marrow disorders.  The patient is aware that the response rates discussed earlier is not guaranteed.  After a long discussion, patient made an informed decision to proceed with the prescribed plan of care and went ahead to sign the consent form today.   Patient education material was dispensed.  Treatment decision is based on NCCN guidelines, 100 mg/m2 every 3 weeks for total of 3 cycles with concurrent radiation treatment. References as follows:  Postoperative Concurrent Radiotherapy and Chemotherapy for High-Risk Squamous-Cell Carcinoma of the Head and Neck Ulice Dash S. Burt Knack, M.D., Lucille Passy. Georjean Mode, Ph.D., Arlene A. Jasper Loser, M.D., Blase Mess, M.D., Darrick Penna. Megan Salon, M.D., [place] Jonna Munro, M.D., Domingo Cocking. Lyndon Code, M.D., Sallye Lat, M.D., Luciano Cutter, M.D., Polly Cobia, M.D., Minerva Ends, M.D., Sandre Kitty, M.D., K.S.  Mariane Duval, M.D., Trinidad Curet, M.D., Meryl Dare, M.D., and Danae Orleans. Fu, M.D. for the Radiation Therapy [facility]cology Group [employer] [facility] 2004; [phone] 6, 2004DOI: 10.1056/[id number]  Due to concern for risk of kidney injury, I will stop Cozaar The patient will call me if his systolic blood pressure continues to run high. In that case, I will prescribe amlodipine I gave him prescription of anti-emetics, EMLA cream and pain medicine to take as needed I will see him on a weekly basis for supportive care  Hemoptysis He denies further hemoptysis. Continue  close monitoring   No orders of the defined types were placed in this encounter.  All questions were answered. The patient knows to call the clinic with any problems, questions or concerns. No barriers to learning was detected. I spent 30 minutes counseling the patient face to face. The total time spent in the appointment was 40 minutes and more than 50% was on counseling and review of test results     Heath Lark, MD 03/17/2016 2:35 PM

## 2016-03-17 NOTE — Telephone Encounter (Signed)
Appointments scheduled per 03/17/16 los. AVS report and appointment schedule given to patient, per 03/17/16 los. °

## 2016-03-18 ENCOUNTER — Telehealth: Payer: Self-pay | Admitting: *Deleted

## 2016-03-18 ENCOUNTER — Other Ambulatory Visit: Payer: Self-pay | Admitting: Hematology and Oncology

## 2016-03-18 DIAGNOSIS — I10 Essential (primary) hypertension: Secondary | ICD-10-CM | POA: Insufficient documentation

## 2016-03-18 MED ORDER — AMLODIPINE BESYLATE 10 MG PO TABS: 10.0000 mg | ORAL_TABLET | Freq: Every day | ORAL | 11 refills | Status: DC

## 2016-03-18 MED FILL — AMLODIPINE BESYLATE 10 MG T: 10 | 30 days supply | Qty: 30 | Fill #0

## 2016-03-18 NOTE — Telephone Encounter (Signed)
I LVMM for patient/wife re Rx.

## 2016-03-18 NOTE — Telephone Encounter (Signed)
Oncology Nurse Navigator Documentation  LVMM for patient's wife informing her amlodipine Rx has been sent to [place].  Gayleen Orem, RN, BSN, [place] at [place] [phone]

## 2016-03-18 NOTE — Telephone Encounter (Signed)
Oncology Nurse Navigator Documentation  Spoke with patient wife:  Informed her husband's start time on 11/13 for chemo has been moved up to 0730.  She voiced understanding.  She voiced understanding of Dr. Calton Dach guidance for husband to hold losartan beginning with start of chemo, but stated his job and insurance coverage requires he take medication for hypertension.  She requested alternative Rx.  I indicated I would inform Dr. Alvy Bimler.  She understands I will see them Thursday at [place] when he has PAC/PEG placed to provide additional care/use education.  I provided my fax number for receipt of disability paperwork.  Gayleen Orem, RN, BSN, [place] at [place] [phone]

## 2016-03-18 NOTE — Telephone Encounter (Signed)
Oncology Nurse Navigator Documentation  LVM for Todd [name], [place], re delivery of PEG Starter Kit to [facility] Short Stay on 11/9, asked he call to confirm message receipt.  Gayleen Orem, RN, BSN, [place] at [place] [phone]

## 2016-03-18 NOTE — Telephone Encounter (Signed)
Oncology Nurse Navigator Documentation  Received call from [facility] confirming delivery of PEG Starter Kit on Thursday.  Gayleen Orem, RN, BSN, [place] at [place] [phone]

## 2016-03-18 NOTE — Telephone Encounter (Signed)
I will substitute that with amlodipine. I will send prescription to [place]

## 2016-03-19 ENCOUNTER — Other Ambulatory Visit: Payer: Self-pay | Admitting: Radiology

## 2016-03-19 ENCOUNTER — Encounter: Payer: Self-pay | Admitting: *Deleted

## 2016-03-19 DIAGNOSIS — Z79899 Other long term (current) drug therapy: Secondary | ICD-10-CM | POA: Diagnosis not present

## 2016-03-19 DIAGNOSIS — Z809 Family history of malignant neoplasm, unspecified: Secondary | ICD-10-CM | POA: Diagnosis not present

## 2016-03-19 DIAGNOSIS — Z87891 Personal history of nicotine dependence: Secondary | ICD-10-CM | POA: Diagnosis not present

## 2016-03-19 DIAGNOSIS — Z7982 Long term (current) use of aspirin: Secondary | ICD-10-CM | POA: Diagnosis not present

## 2016-03-19 DIAGNOSIS — Z510 Encounter for antineoplastic radiation therapy: Secondary | ICD-10-CM | POA: Diagnosis not present

## 2016-03-19 DIAGNOSIS — E785 Hyperlipidemia, unspecified: Secondary | ICD-10-CM | POA: Diagnosis not present

## 2016-03-19 DIAGNOSIS — E039 Hypothyroidism, unspecified: Secondary | ICD-10-CM | POA: Diagnosis not present

## 2016-03-19 DIAGNOSIS — M1990 Unspecified osteoarthritis, unspecified site: Secondary | ICD-10-CM | POA: Diagnosis not present

## 2016-03-19 DIAGNOSIS — C090 Malignant neoplasm of tonsillar fossa: Secondary | ICD-10-CM | POA: Diagnosis not present

## 2016-03-19 DIAGNOSIS — R1310 Dysphagia, unspecified: Secondary | ICD-10-CM | POA: Diagnosis not present

## 2016-03-19 DIAGNOSIS — I10 Essential (primary) hypertension: Secondary | ICD-10-CM | POA: Diagnosis not present

## 2016-03-19 NOTE — Progress Notes (Signed)
Oncology Nurse Navigator Documentation  Met patient's wife in [facility] lobby, gave her husband's barium sulfate suspension, reviewed instructions for drinking prior to tomorrow's PEG placement. I showed her location of [facility], explained arrival and registration procedure for tomorrow's PEG/PAC procedures. I provided directions to [place] to pick up losartan Rx issued by Dr. Alvy Bimler yesterday.  Gayleen Orem, RN, BSN, [place] at [place] [phone]

## 2016-03-20 ENCOUNTER — Encounter (HOSPITAL_COMMUNITY): Payer: Self-pay

## 2016-03-20 ENCOUNTER — Ambulatory Visit (HOSPITAL_COMMUNITY)
Admission: RE | Admit: 2016-03-20 | Discharge: 2016-03-20 | Disposition: A | Discharge status: Home / Self Care | Payer: BLUE CROSS/BLUE SHIELD | Source: Ambulatory Visit | Attending: Hematology and Oncology | Admitting: Hematology and Oncology

## 2016-03-20 ENCOUNTER — Ambulatory Visit (HOSPITAL_COMMUNITY)
Admission: RE | Admit: 2016-03-20 | Discharge: 2016-03-20 | Disposition: A | Discharge status: Home / Self Care | Payer: BLUE CROSS/BLUE SHIELD | Source: Ambulatory Visit | Attending: Interventional Radiology | Admitting: Interventional Radiology

## 2016-03-20 ENCOUNTER — Encounter: Payer: Self-pay | Admitting: *Deleted

## 2016-03-20 ENCOUNTER — Other Ambulatory Visit: Payer: Self-pay | Admitting: Hematology and Oncology

## 2016-03-20 DIAGNOSIS — C099 Malignant neoplasm of tonsil, unspecified: Secondary | ICD-10-CM

## 2016-03-20 DIAGNOSIS — E785 Hyperlipidemia, unspecified: Secondary | ICD-10-CM | POA: Insufficient documentation

## 2016-03-20 DIAGNOSIS — Z87891 Personal history of nicotine dependence: Secondary | ICD-10-CM | POA: Diagnosis not present

## 2016-03-20 DIAGNOSIS — Z7982 Long term (current) use of aspirin: Secondary | ICD-10-CM | POA: Diagnosis not present

## 2016-03-20 DIAGNOSIS — E039 Hypothyroidism, unspecified: Secondary | ICD-10-CM | POA: Diagnosis not present

## 2016-03-20 DIAGNOSIS — Z4682 Encounter for fitting and adjustment of non-vascular catheter: Secondary | ICD-10-CM | POA: Diagnosis not present

## 2016-03-20 DIAGNOSIS — I10 Essential (primary) hypertension: Secondary | ICD-10-CM | POA: Insufficient documentation

## 2016-03-20 DIAGNOSIS — Z5111 Encounter for antineoplastic chemotherapy: Secondary | ICD-10-CM | POA: Diagnosis not present

## 2016-03-20 DIAGNOSIS — Z79899 Other long term (current) drug therapy: Secondary | ICD-10-CM | POA: Insufficient documentation

## 2016-03-20 HISTORY — PX: IR GENERIC HISTORICAL: IMG1180011

## 2016-03-20 LAB — PROTIME-INR
INR: 1.01
Prothrombin Time: 13.3 seconds (ref 11.4–15.2)

## 2016-03-20 LAB — CBC WITH DIFFERENTIAL/PLATELET
Basophils Absolute: 0 10*3/uL (ref 0.0–0.1)
Basophils Relative: 1 %
EOS ABS: 0.1 10*3/uL (ref 0.0–0.7)
Eosinophils Relative: 1 %
HEMATOCRIT: 35.2 % — AB (ref 39.0–52.0)
HEMOGLOBIN: 12.5 g/dL — AB (ref 13.0–17.0)
LYMPHS ABS: 1.2 10*3/uL (ref 0.7–4.0)
Lymphocytes Relative: 19 %
MCH: 30 pg (ref 26.0–34.0)
MCHC: 35.5 g/dL (ref 30.0–36.0)
MCV: 84.6 fL (ref 78.0–100.0)
MONO ABS: 0.5 10*3/uL (ref 0.1–1.0)
MONOS PCT: 9 %
NEUTROS ABS: 4.4 10*3/uL (ref 1.7–7.7)
NEUTROS PCT: 70 %
Platelets: 467 10*3/uL — ABNORMAL HIGH (ref 150–400)
RBC: 4.16 MIL/uL — ABNORMAL LOW (ref 4.22–5.81)
RDW: 12.3 % (ref 11.5–15.5)
WBC: 6.2 10*3/uL (ref 4.0–10.5)

## 2016-03-20 LAB — BASIC METABOLIC PANEL
Anion gap: 9 (ref 5–15)
BUN: 19 mg/dL (ref 6–20)
CHLORIDE: 105 mmol/L (ref 101–111)
CO2: 25 mmol/L (ref 22–32)
CREATININE: 1.09 mg/dL (ref 0.61–1.24)
Calcium: 9.7 mg/dL (ref 8.9–10.3)
GFR calc non Af Amer: >60 mL/min (ref >60)
GLUCOSE: 102 mg/dL — AB (ref 65–99)
Potassium: 3.9 mmol/L (ref 3.5–5.1)
Sodium: 139 mmol/L (ref 135–145)

## 2016-03-20 MED ORDER — HEPARIN SOD (PORK) LOCK FLUSH 100 UNIT/ML IV SOLN
INTRAVENOUS | Status: AC
  Filled 2016-03-20: qty 5

## 2016-03-20 MED ORDER — GLUCAGON HCL RDNA (DIAGNOSTIC) 1 MG IJ SOLR
INTRAMUSCULAR | Status: AC
  Filled 2016-03-20: qty 1

## 2016-03-20 MED ORDER — NALOXONE HCL 0.4 MG/ML IJ SOLN
INTRAMUSCULAR | Status: AC
  Filled 2016-03-20: qty 1

## 2016-03-20 MED ORDER — LIDOCAINE-EPINEPHRINE (PF) 2 %-1:200000 IJ SOLN
INTRAMUSCULAR | Status: AC
  Filled 2016-03-20: qty 20

## 2016-03-20 MED ORDER — MIDAZOLAM HCL 2 MG/2ML IJ SOLN
INTRAMUSCULAR | Status: AC | PRN
  Administered 2016-03-20: 0.5 mg via INTRAVENOUS
  Administered 2016-03-20: 1 mg via INTRAVENOUS
  Administered 2016-03-20: 0.5 mg via INTRAVENOUS
  Administered 2016-03-20: 1 mg via INTRAVENOUS
  Administered 2016-03-20: 0.5 mg via INTRAVENOUS

## 2016-03-20 MED ORDER — SODIUM CHLORIDE 0.9 % IV SOLN
INTRAVENOUS | Status: DC
  Administered 2016-03-20: 12:00:00 via INTRAVENOUS

## 2016-03-20 MED ORDER — FENTANYL CITRATE (PF) 100 MCG/2ML IJ SOLN
INTRAMUSCULAR | Status: AC
  Filled 2016-03-20: qty 6

## 2016-03-20 MED ORDER — GLUCAGON HCL (RDNA) 1 MG IJ SOLR
INTRAMUSCULAR | Status: AC | PRN
  Administered 2016-03-20: 1 mg via INTRAVENOUS

## 2016-03-20 MED ORDER — MIDAZOLAM HCL 2 MG/2ML IJ SOLN
INTRAMUSCULAR | Status: AC
  Filled 2016-03-20: qty 6

## 2016-03-20 MED ORDER — FENTANYL CITRATE (PF) 100 MCG/2ML IJ SOLN
INTRAMUSCULAR | Status: AC | PRN
  Administered 2016-03-20: 50 ug via INTRAVENOUS
  Administered 2016-03-20 (×2): 25 ug via INTRAVENOUS
  Administered 2016-03-20: 50 ug via INTRAVENOUS
  Administered 2016-03-20: 25 ug via INTRAVENOUS

## 2016-03-20 MED ORDER — FLUMAZENIL 0.5 MG/5ML IV SOLN
INTRAVENOUS | Status: AC
  Filled 2016-03-20: qty 5

## 2016-03-20 MED ORDER — CEFAZOLIN SODIUM-DEXTROSE 2-4 GM/100ML-% IV SOLN
2.0000 g | INTRAVENOUS | Status: AC
  Administered 2016-03-20: 2 g via INTRAVENOUS
  Filled 2016-03-20: qty 100

## 2016-03-20 NOTE — Progress Notes (Signed)
Oncology Nurse Navigator Documentation  Met with Mr. Todd [name] Short Stay 1317 to provide additional PEG/PAC care instructions prior to procedures.  His wife was at the bedside. Using  PEG teaching device   and Teach Back, provided education for PEG use and care, including: hand hygiene, reviewed previous instruction re gravity bolus administration of daily water flushes, nutritional supplement, fluids and medications; care of tube insertion site including daily dressing change and cleaning; S&S of infection.  Todd [name] and wife correctly verbalized dressing change and cleaning procedures.  They understand I will be available for ongoing PEG educational support. In addition, ToddWiley confirmed he had Emla cream for PAC, correctly verbalized pre-infusion application procedure.  They understand I can be contacted with needs/questions.  Gayleen Orem, RN, BSN, [place] at [place] [phone]

## 2016-03-20 NOTE — Sedation Documentation (Signed)
Patient is resting comfortably. 

## 2016-03-20 NOTE — Procedures (Signed)
Successful placement of right IJ approach port-a-cath with tip at the superior caval atrial junction. The port is ready for immediate use.  Successful fluoroscopic guided insertion of gastrostomy tube.   The gastrostomy tube may be used immediately for medications.    Tube feeds may be initiated in 24 hours as per the primary team.    EBL: Minimal  No immediate post procedural complications.   Ronny Bacon, MD Pager #: [phone]

## 2016-03-20 NOTE — Sedation Documentation (Signed)
Patient is resting comfortably. 

## 2016-03-20 NOTE — Consult Note (Signed)
Chief Complaint: Patient was seen in consultation today for Port-A-Cath and Gastrostomy tube placement Referring Physician(s): Gorsuch,Ni  Supervising Physician: Sandi Mariscal  Patient Status: [facility]  History of Present Illness: Todd [name] is a 55 y.o. 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   Past Medical History:  Diagnosis Date  . Arthritis   . Cancer ([place])    Tonsil Cancer  . Hyperlipidemia   . Hypertension   . Hypothyroidism     No past surgical history on file.  Allergies: Patient has no known allergies.  Medications: Prior to Admission medications   Medication Sig Start Date End Date Taking? Authorizing Provider  acetaminophen (TYLENOL) 650 MG CR tablet Take 1,300 mg by mouth.    Historical Provider, MD  amLODipine (NORVASC) 10 MG tablet Take 1 tablet (10 mg total) by mouth daily. 03/18/16   Heath Lark, MD  aspirin EC 81 MG tablet Take by mouth.    Historical Provider, MD  fenofibrate (TRICOR) 145 MG tablet  12/03/15   Historical Provider, MD  fentaNYL (DURAGESIC - DOSED MCG/HR) 25 MCG/HR patch Place 1 patch (25 mcg total) onto the skin every 3 (three) days. 03/17/16   Heath Lark, MD  GLUCOSAMINE SULFATE PO Take by mouth.    Historical Provider, MD  HYDROcodone-acetaminophen ([place]) 10-325 MG tablet TAKE 1 TABLET BY MOUTH EVERY 4-6 HOURS AS NEEDED FOR PAIN 03/07/16   Historical Provider, MD  levothyroxine (SYNTHROID, LEVOTHROID) 50 MCG tablet  11/21/15   Historical Provider, MD  lidocaine-prilocaine (EMLA) cream Apply to affected area once 03/17/16   Heath Lark, MD  losartan (COZAAR) 100 MG tablet  11/21/15   Historical Provider, MD  morphine (ROXANOL) 20 MG/ML concentrated solution Take 0.5 mLs (10 mg total) by mouth every 2 (two) hours as needed for severe pain. 03/17/16   Heath Lark, MD  Multiple Vitamin (MULTIVITAMIN) capsule  Take by mouth.    Historical Provider, MD  ondansetron (ZOFRAN) 8 MG tablet Take 1 tablet (8 mg total) by mouth 2 (two) times daily as needed. Start on the third day after chemotherapy. 03/17/16   Heath Lark, MD  prochlorperazine (COMPAZINE) 10 MG tablet Take 1 tablet (10 mg total) by mouth every 6 (six) hours as needed (Nausea or vomiting). 03/17/16   Heath Lark, MD  sodium fluoride (FLUORISHIELD) 1.1 % GEL dental gel Instill one drop of gel per tooth space of fluoride tray. Place over teeth for 5 minutes. Remove. Spit out excess. Repeat nightly. Patient not taking: Reported on 03/17/2016 02/28/16   Lenn Cal, DDS     Family History  Problem Relation Age of Onset  . Cancer Maternal Grandmother     unknown ca  . Dementia Father   . Diabetes Father     Social History   Social History  . Marital status: Married    Spouse name: Jenny Reichmann  . Number of children: 3  . Years of education: N/A   Occupational History  . sales rep     [employer]   Social History Main Topics  . Smoking status: Former Research scientist (life sciences)  . Smokeless tobacco: Never Used  . Alcohol use No  . Drug use: No  . Sexual activity: Not on file   Other Topics Concern  . Not on file   Social History Narrative   02/28/2016   Patient is divorced and remarried. Patient had 2  daughters with his first wife and one daughter passed away due to SIDS. Patient has 2 stepchildren from his recent marriage. There ages are 45 and 45.   Patient smokes cigarettes from the ages of [age]. None since then. Patient never chewed tobacco. Patient with rare use of alcohol. Patient does not use other illicit drugs.       Review of Systems  Pt denies fever, headache, chest pain, dyspnea, cough, abdominal pain, back pain, nausea, vomiting. Admits to intermittent productive cough with blood tinged phlegm and dysphagia.  Vital Signs: BP 120/78   Pulse 61   Temp 98.6 F (37 C) (Oral)   Resp 16   Ht 5\' 10"  (1.778 m)   Wt 187 lb 2 oz (84.9  kg)   SpO2 99%   BMI 26.85 kg/m   Physical Exam  Alert and oriented Pulm: CTAB CV: RRR Abd: bowels sounds present and normal Ext: No edema  Mallampati Score:     Imaging: Nm Pet Image Initial (pi) Skull Base To Thigh  Result Date: 02/26/2016 CLINICAL DATA:  Initial treatment strategy for tonsillar cancer. Left neck mass. EXAM: NUCLEAR MEDICINE PET SKULL BASE TO THIGH TECHNIQUE: 12.6 mCi F-18 FDG was injected intravenously. Full-ring PET imaging was performed from the skull base to thigh after the radiotracer. CT data was obtained and used for attenuation correction and anatomic localization. FASTING BLOOD GLUCOSE:  Value: 102 mg/dl COMPARISON:  02/07/2016 FINDINGS: NECK Abnormal left tonsillar pillar and pharyngeal activity down to the vallecula as on recent CT, maximum standard uptake value of this conglomerate mass 17.9. Left station IIa and station III lymph nodes are present, maximum SUV 12.1. There is also a left station Ib indistinct lymph node measuring about 10 mm in short axis with maximum SUV 9.2. A right station IIa lymph node measuring 1.3 cm in short axis on image 28 series 3 has maximum standard uptake value of 9.0 Chronic bilateral maxillary sinusitis. Small right-sided segment to be lymph node measuring only about 5 mm in short axis has a maximum standard uptake value of 3.4, borderline increased. CHEST No hypermetabolic mediastinal or hilar nodes. 0.7 by 0.4 cm right upper lobe nodule image 87/3, no associated hypermetabolic activity is appreciable, with the understanding that nodules of this size range too small for accurate detection. Along the right major fissure there is a 2-3 mm nodule on image 108/3, again no measurable hypermetabolic activity but technically too small to characterize. 3 mm similar nodule in the left lower lobe, image 100/3. Coronary artery atherosclerosis. ABDOMEN/PELVIS No abnormal hypermetabolic activity within the liver, pancreas, adrenal glands, or  spleen. No hypermetabolic lymph nodes in the abdomen or pelvis. SKELETON No focal hypermetabolic activity to suggest skeletal metastasis. IMPRESSION: 1. Hypermetabolic left tonsillar in pharyngeal mass with hypermetabolic left sided station IA, station III, and station Ib adenopathy and hypermetabolic station IIa adenopathy on the right. Borderline hypermetabolic small right station IIb lymph node. 2. Several tiny lung nodules are observed, these are not hypermetabolic but are also below the threshold level for accurate characterization. Dedicated CT chest may be warranted to act as a baseline. 3. Coronary artery atherosclerosis. Electronically Signed   By: Van Clines M.D.   On: 02/26/2016 13:59    Labs:  CBC:  Recent Labs  03/20/16 1200  WBC 6.2  HGB 12.5*  HCT 35.2*  PLT 467*    COAGS:  Recent Labs  03/20/16 1200  INR 1.01    BMP:  Recent Labs  03/13/16 0835  BUN 16.6  CREATININE 1.1    LIVER FUNCTION TESTS: No results for input(s): BILITOT, AST, ALT, ALKPHOS, PROT, ALBUMIN in the last 8760 hours.  TUMOR MARKERS: No results for input(s): AFPTM, CEA, CA199, CHROMGRNA in the last 8760 hours.  Assessment and Plan: Amerion Thong is a 55 y.o. 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  Risks and benefits discussed with the patient including, but not limited to bleeding, infection, pneumothorax, or fibrin sheath development and need for additional procedures. Risks and benefits discussed with the patient including, but not limited to the need for a barium enema during the procedure, bleeding, infection, peritonitis, or damage to adjacent structures. All of the patient's questions were answered, patient is agreeable to proceed. Consent signed and in chart.     Thank you for this interesting consult.  I greatly enjoyed meeting Bradin Hardin  and look forward to participating in their care.  A copy of this report was sent to the requesting provider on this date.  Electronically Signed: D. Rowe Robert 03/20/2016, 12:33 PM   I spent a total of 20 minutes in face to face in clinical consultation, greater than 50% of which was counseling/coordinating care for Port-A Cath and gastrostomy tube placement.

## 2016-03-20 NOTE — Sedation Documentation (Signed)
Next procedure to begin. Port of Cath now to placed.

## 2016-03-20 NOTE — Discharge Instructions (Addendum)
Moderate Conscious Sedation, Adult, Care After Refer to this sheet in the next few weeks. These instructions provide you with information on caring for yourself after your procedure. Your health care provider may also give you more specific instructions. Your treatment has been planned according to current medical practices, but problems sometimes occur. Call your health care provider if you have any problems or questions after your procedure. WHAT TO EXPECT AFTER THE PROCEDURE  After your procedure:  You may feel sleepy, clumsy, and have poor balance for several hours.  Vomiting may occur if you eat too soon after the procedure. HOME CARE INSTRUCTIONS  Do not participate in any activities where you could become injured for at least 24 hours. Do not:  Drive.  Swim.  Ride a bicycle.  Operate heavy machinery.  Cook.  Use power tools.  Climb ladders.  Work from a high place.  Do not make important decisions or sign legal documents until you are improved.  If you vomit, drink water, juice, or soup when you can drink without vomiting. Make sure you have little or no nausea before eating solid foods.  Only take over-the-counter or prescription medicines for pain, discomfort, or fever as directed by your health care provider.  Make sure you and your family fully understand everything about the medicines given to you, including what side effects may occur.  You should not drink alcohol, take sleeping pills, or take medicines that cause drowsiness for at least 24 hours.  If you smoke, do not smoke without supervision.  If you are feeling better, you may resume normal activities 24 hours after you were sedated.  Keep all appointments with your health care provider. SEEK MEDICAL CARE IF:  Your skin is pale or bluish in color.  You continue to feel nauseous or vomit.  Your pain is getting worse and is not helped by medicine.  You have bleeding or swelling.  You are still  sleepy or feeling clumsy after 24 hours. SEEK IMMEDIATE MEDICAL CARE IF:  You develop a rash.  You have difficulty breathing.  You develop any type of allergic problem.  You have a fever. MAKE SURE YOU:  Understand these instructions.  Will watch your condition.  Will get help right away if you are not doing well or get worse.   This information is not intended to replace advice given to you by your health care provider. Make sure you discuss any questions you have with your health care provider.   Document Released: 02/16/2013 Document Revised: 05/19/2014 Document Reviewed: 02/16/2013 Elsevier Interactive Patient Education 2016 [place] An implanted port is a type of central line that is placed under the skin. Central lines are used to provide IV access when treatment or nutrition needs to be given through a person's veins. Implanted ports are used for long-term IV access. An implanted port may be placed because:   You need IV medicine that would be irritating to the small veins in your hands or arms.   You need long-term IV medicines, such as antibiotics.   You need IV nutrition for a long period.   You need frequent blood draws for lab tests.   You need dialysis.  Implanted ports are usually placed in the chest area, but they can also be placed in the upper arm, the abdomen, or the leg. An implanted port has two main parts:   Reservoir. The reservoir is round and will appear as a small, raised area  area under your skin. The reservoir is the part where a needle is inserted to give medicines or draw blood.   °· Catheter. The catheter is a thin, flexible tube that extends from the reservoir. The catheter is placed into a large vein. Medicine that is inserted into the reservoir goes into the catheter and then into the vein.   °HOW WILL I CARE FOR MY INCISION SITE? °Do not get the incision site wet. Bathe or shower as directed by your health care  provider.  °HOW IS MY PORT ACCESSED? °Special steps must be taken to access the port:  °· Before the port is accessed, a numbing cream can be placed on the skin. This helps numb the skin over the port site.   °· Your health care provider uses a sterile technique to access the port. °· Your health care provider must put on a mask and sterile gloves. °· The skin over your port is cleaned carefully with an antiseptic and allowed to dry. °· The port is gently pinched between sterile gloves, and a needle is inserted into the port. °· Only "non-coring" port needles should be used to access the port. Once the port is accessed, a blood return should be checked. This helps ensure that the port is in the vein and is not clogged.   °· If your port needs to remain accessed for a constant infusion, a clear (transparent) bandage will be placed over the needle site. The bandage and needle will need to be changed every week, or as directed by your health care provider.   °· Keep the bandage covering the needle clean and dry. Do not get it wet. Follow your health care provider's instructions on how to take a shower or bath while the port is accessed.   °· If your port does not need to stay accessed, no bandage is needed over the port.   °WHAT IS FLUSHING? °Flushing helps keep the port from getting clogged. Follow your health care provider's instructions on how and when to flush the port. Ports are usually flushed with saline solution or a medicine called heparin. The need for flushing will depend on how the port is used.  °· If the port is used for intermittent medicines or blood draws, the port will need to be flushed:   °· After medicines have been given.   °· After blood has been drawn.   °· As part of routine maintenance.   °· If a constant infusion is running, the port may not need to be flushed.   °HOW LONG WILL MY PORT STAY IMPLANTED? °The port can stay in for as long as your health care provider thinks it is needed. When it  is time for the port to come out, surgery will be done to remove it. The procedure is similar to the one performed when the port was put in.  °WHEN SHOULD I SEEK IMMEDIATE MEDICAL CARE? °When you have an implanted port, you should seek immediate medical care if:  °· You notice a bad smell coming from the incision site.   °· You have swelling, redness, or drainage at the incision site.   °· You have more swelling or pain at the port site or the surrounding area.   °· You have a fever that is not controlled with medicine. °  °This information is not intended to replace advice given to you by your health care provider. Make sure you discuss any questions you have with your health care provider. °  °Document Released: 04/28/2005 Document Revised: 02/16/2013 Document Reviewed: 01/03/2013 °Elsevier Interactive   Education 2016 [place] Insertion, Care After Refer to this sheet in the next few weeks. These instructions provide you with information on caring for yourself after your procedure. Your health care provider may also give you more specific instructions. Your treatment has been planned according to current medical practices, but problems sometimes occur. Call your health care provider if you have any problems or questions after your procedure. WHAT TO EXPECT AFTER THE PROCEDURE After your procedure, it is typical to have the following:   Discomfort at the port insertion site. Ice packs to the area will help.  Bruising on the skin over the port. This will subside in 3-4 days. HOME CARE INSTRUCTIONS  After your port is placed, you will get a manufacturer's information card. The card has information about your port. Keep this card with you at all times.   Know what kind of port you have. There are many types of ports available.   Wear a medical alert bracelet in case of an emergency. This can help alert health care workers that you have a port.   The port can stay in  for as long as your health care provider believes it is necessary.   A home health care nurse may give medicines and take care of the port.   You or a family member can get special training and directions for giving medicine and taking care of the port at home.  SEEK MEDICAL CARE IF:   Your port does not flush or you are unable to get a blood return.   You have a fever or chills. SEEK IMMEDIATE MEDICAL CARE IF:  You have new fluid or pus coming from your incision.   You notice a bad smell coming from your incision site.   You have swelling, pain, or more redness at the incision or port site.   You have chest pain or shortness of breath.   This information is not intended to replace advice given to you by your health care provider. Make sure you discuss any questions you have with your health care provider.   Document Released: 02/16/2013 Document Revised: 05/03/2013 Document Reviewed: 02/16/2013 Elsevier Interactive Patient Education 2016 [place].   Gastrostomy Tube Replacement, Care After Refer to this sheet in the next few weeks. These instructions provide you with information on caring for yourself after your procedure. Your health care provider may also give you more specific instructions. Your treatment has been planned according to current medical practices, but problems sometimes occur. Call your health care provider if you have any problems or questions after your procedure. WHAT TO EXPECT AFTER THE PROCEDURE  After your procedure, it is typical to have the following:   Mild abdominal pain.  A small amount of blood-tinged fluid leaking from the replacement site. HOME CARE INSTRUCTIONS  You may resume your normal level of activity.  You may resume your normal feedings.  Care for your gastrostomy tube as you did before, or as directed by your health care provider. SEEK MEDICAL CARE IF:  You have a fever or chills.  You have redness or irritation near  the insertion site.  You continue to have abdominal pain or leaking around your gastrostomy tube. SEEK IMMEDIATE MEDICAL CARE IF:   You develop bleeding or significant discharge around the tube.  You have severe abdominal pain.  Your new tube does not seem to be working properly.  You are unable to get feedings into the tube.  Your tube  comes out for any reason.    This information is not intended to replace advice given to you by your health care provider. Make sure you discuss any questions you have with your health care provider.   Document Released: 11/23/2013 Document Reviewed: 11/23/2013 Elsevier Interactive Patient Education [employer].

## 2016-03-24 ENCOUNTER — Encounter: Payer: Self-pay | Admitting: *Deleted

## 2016-03-24 ENCOUNTER — Ambulatory Visit
Admission: RE | Admit: 2016-03-24 | Discharge: 2016-03-24 | Disposition: A | Discharge status: Home / Self Care | Payer: BLUE CROSS/BLUE SHIELD | Source: Ambulatory Visit | Attending: Radiation Oncology | Admitting: Radiation Oncology

## 2016-03-24 ENCOUNTER — Ambulatory Visit: Payer: BLUE CROSS/BLUE SHIELD | Admitting: Nutrition

## 2016-03-24 ENCOUNTER — Encounter: Payer: Self-pay | Admitting: Radiation Oncology

## 2016-03-24 ENCOUNTER — Ambulatory Visit (HOSPITAL_BASED_OUTPATIENT_CLINIC_OR_DEPARTMENT_OTHER): Payer: BLUE CROSS/BLUE SHIELD

## 2016-03-24 VITALS — BP 117/77 | HR 70 | Temp 98.6°F | Resp 18

## 2016-03-24 VITALS — BP 130/79 | HR 66 | Temp 97.8°F | Ht 70.0 in | Wt 187.6 lb

## 2016-03-24 DIAGNOSIS — Z5111 Encounter for antineoplastic chemotherapy: Secondary | ICD-10-CM | POA: Diagnosis not present

## 2016-03-24 DIAGNOSIS — E039 Hypothyroidism, unspecified: Secondary | ICD-10-CM | POA: Diagnosis not present

## 2016-03-24 DIAGNOSIS — C090 Malignant neoplasm of tonsillar fossa: Secondary | ICD-10-CM

## 2016-03-24 DIAGNOSIS — Z87891 Personal history of nicotine dependence: Secondary | ICD-10-CM | POA: Diagnosis not present

## 2016-03-24 DIAGNOSIS — Z79899 Other long term (current) drug therapy: Secondary | ICD-10-CM | POA: Diagnosis not present

## 2016-03-24 DIAGNOSIS — Z7982 Long term (current) use of aspirin: Secondary | ICD-10-CM | POA: Diagnosis not present

## 2016-03-24 DIAGNOSIS — M1990 Unspecified osteoarthritis, unspecified site: Secondary | ICD-10-CM | POA: Diagnosis not present

## 2016-03-24 DIAGNOSIS — Z923 Personal history of irradiation: Secondary | ICD-10-CM | POA: Insufficient documentation

## 2016-03-24 DIAGNOSIS — I10 Essential (primary) hypertension: Secondary | ICD-10-CM | POA: Diagnosis not present

## 2016-03-24 DIAGNOSIS — Z510 Encounter for antineoplastic radiation therapy: Secondary | ICD-10-CM | POA: Diagnosis not present

## 2016-03-24 DIAGNOSIS — E785 Hyperlipidemia, unspecified: Secondary | ICD-10-CM | POA: Diagnosis not present

## 2016-03-24 DIAGNOSIS — Z809 Family history of malignant neoplasm, unspecified: Secondary | ICD-10-CM | POA: Diagnosis not present

## 2016-03-24 DIAGNOSIS — C099 Malignant neoplasm of tonsil, unspecified: Secondary | ICD-10-CM

## 2016-03-24 DIAGNOSIS — R1310 Dysphagia, unspecified: Secondary | ICD-10-CM | POA: Diagnosis not present

## 2016-03-24 MED ORDER — SODIUM CHLORIDE 0.9 % IV SOLN
200.0000 mg | Freq: Once | INTRAVENOUS | Status: AC
  Administered 2016-03-24: 200 mg via INTRAVENOUS
  Filled 2016-03-24: qty 200

## 2016-03-24 MED ORDER — POTASSIUM CHLORIDE 2 MEQ/ML IV SOLN
Freq: Once | INTRAVENOUS | Status: AC
  Administered 2016-03-24: 08:00:00 via INTRAVENOUS
  Filled 2016-03-24: qty 10

## 2016-03-24 MED ORDER — HEPARIN SOD (PORK) LOCK FLUSH 100 UNIT/ML IV SOLN
500.0000 [IU] | Freq: Once | INTRAVENOUS | Status: AC | PRN
  Administered 2016-03-24: 500 [IU]
  Filled 2016-03-24: qty 5

## 2016-03-24 MED ORDER — SODIUM CHLORIDE 0.9 % IV SOLN
INTRAVENOUS | Status: DC
  Administered 2016-03-24: 10:00:00 via INTRAVENOUS

## 2016-03-24 MED ORDER — SONAFINE EX EMUL
1.0000 "application " | Freq: Once | CUTANEOUS | Status: AC
  Administered 2016-03-24: 1 via TOPICAL

## 2016-03-24 MED ORDER — PALONOSETRON HCL INJECTION 0.25 MG/5ML
0.2500 mg | Freq: Once | INTRAVENOUS | Status: AC
  Administered 2016-03-24: 0.25 mg via INTRAVENOUS

## 2016-03-24 MED ORDER — PALONOSETRON HCL INJECTION 0.25 MG/5ML
INTRAVENOUS | Status: AC
  Filled 2016-03-24: qty 5

## 2016-03-24 MED ORDER — SODIUM CHLORIDE 0.9 % IV SOLN
Freq: Once | INTRAVENOUS | Status: AC
  Administered 2016-03-24: 10:00:00 via INTRAVENOUS
  Filled 2016-03-24: qty 5

## 2016-03-24 MED ORDER — SODIUM CHLORIDE 0.9% FLUSH
10.0000 mL | INTRAVENOUS | Status: DC | PRN
  Administered 2016-03-24: 10 mL
  Filled 2016-03-24: qty 10

## 2016-03-24 NOTE — Progress Notes (Addendum)
Oncology Nurse Navigator Documentation  Met with Todd [name] in [place] where he was receiving initial Cistplatin. I provided him PEG supplies delivered by [facility]. He reported:  Minimal discomfort following last Thursday's PEG and PAC insertion.  Changed PEG dsg Saturday and began daily flushes without difficulty.  He understands I am available for ongoing PEG support. We discussed his initial RT later today. He denied any questions/concerns.  Gayleen Orem, RN, BSN, [place] at [place] [phone]

## 2016-03-24 NOTE — Progress Notes (Signed)
Oncology Nurse Navigator Documentation  Met with Todd [name] following his initial Tomotherapy XRT. He stated he tolerated procedure without difficulty. He understands I am following up on FMLA and Information Release forms recently faxed.  Gayleen Orem, RN, BSN, [place] at [place] at [place] [phone]

## 2016-03-24 NOTE — Progress Notes (Signed)
Pt here for patient teaching.  Pt given Radiation and You booklet, Managing Acute Radiation Side Effects for Head and Neck Cancer handout, skin care instructions and Sonafine. Pt reports they have not watched the Radiation Therapy Education video, but was given the link to watch at home.  Reviewed areas of pertinence such as fatigue, hair loss, mouth changes, skin changes, throat changes, cough and taste changes . Pt able to give teach back of to pat skin, use unscented/gentle soap and drink plenty of water,apply Sonafine bid, avoid applying anything to skin within 4 hours of treatment and to use an electric razor if they must shave. Pt verbalizes understanding of information given and will contact nursing with any questions or concerns.     [email or web address]  Managing Acute Radiation Side Effects for Head and Neck Cancer  Skin irritation:  . Sonafine  Topical Emulsion: First-line topical cream to help soothe skin irritation.  Apply to skin in radiation fields at least 4 hours before radiotherapy, or any time after treatments during the rest of the day.  . Triple Antibiotic Ointment (Neosporin): Apply to areas of skin with moist breakdown to prevent infection.  . 1% hydrocortisone cream: Apply to areas of skin that are itching, up to three times a day.  Arnetha Massy (Silver Sulfadiazine): Used in select cases if large patches of skin develop moist breakdown (let physician or nurse know if you have a "sulfa" drug allergy)  Soreness in mouth or throat: . Baking Soda Rinse: a home remedy to soothe/cleanse mouth and loosen thick saliva.  Mix 1/2 teaspoon salt, 1/2 teaspoon baking soda, 1 pint water (16 oz or two cups).  Swish, gargle and spit as needed to soothe/cleanse mouth. Use as often as you want.  . Sucralfate (Carafate): coats throat to soothe it before meals or any time of day. Crush 1 tablet in 10 mL H20 and swallow up to four times a day.  . 2%  viscous Lidocaine (Magic Mouth Wash): Soothes mouth and/or throat by numbing your mucous membranes. Mix 1 part 2% viscous lidocaine (Magic Mouth Wash), 1 part H20. Swish and/or swallow 61mL of this mixture, 39min before meals and at bedtime, up to four times a day. Alternate with Sucralfate (Carafate).  . Narcotics: Various short acting and long acting narcotics can be prescribed.  Often, medical oncology will prescribe these if you are receiving chemotherapy concurrently. Narcotics may cause constipation. It may be helpful to take a stool softener (Docusate Sodium) or gentle laxative (ie Senna or Polyethylene Glycol) to prevent constipation.  Having food in your stomach before ingesting a narcotic may reduce risk of stomach upset.  Thick Saliva: . Baking Soda Rinse: a home remedy to soothe/cleanse mouth and loosen thick saliva.  Mix 1/2 teaspoon salt, 1/2 teaspoon baking soda, 1 pint water (16 oz or two cups).  Swish, gargle, and spit as needed to soothe/cleanse mouth. Use as often as you want.  . Some patients find Diet Ginger Ale or Papaya Juice to be helpful.  . In extreme cases, your physician may consider prescribing a Scopolamine transdermal patch which dries up your saliva.     Poor taste, or lack of taste:   . There are no well-established medications to combat taste bud changes from radiotherapy.  It often takes weeks to months to regain taste function.  Eating bland foods and drinking nutritional shakes  may help you maintain your weight when food is not enjoyable.  Some patients supplement their oral intake with a feeding  tube.  Fatigue and weakness: . There is not a well-established safe and effective medication to combat radiation-induced fatigue.  However, if you are able to perform light exercise (such as a daily walk, yoga, recumbent stationary bicycling), this may combat fatigue and help you maintain muscle mass during treatment.  . Maintaining hydration and nutrition are also  important.  If you have not been referred to a nutritionist and would like a referral, please let your nurse or physician know.  . Try to get at least 8 hours of sleep each night. You may need a daily nap, but try not to nap so late that it interferes with your nightly sleep schedule.

## 2016-03-24 NOTE — Progress Notes (Signed)
Mr. Caisse presents for his first fraction of radiation to his Left Tonsil and bilateral neck. He denies pain. He received chemotherapy today. He had his portacath and feeding tube placed last week. He has had no difficulties related to the placement of these. He is eating well, and plans to try to gain some weight over the next week.  He has no other concerns at this time.  BP 130/79   Pulse 66   Temp 97.8 F (36.6 C)   Ht 5\' 10"  (1.778 m)   Wt 187 lb 9.6 oz (85.1 kg)   SpO2 97% Comment: room air  BMI 26.92 kg/m    Wt Readings from Last 3 Encounters:  03/24/16 187 lb 9.6 oz (85.1 kg)  03/20/16 187 lb 2 oz (84.9 kg)  03/17/16 193 lb (87.5 kg)

## 2016-03-24 NOTE — Progress Notes (Signed)
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 Weight decreased and documented as 187 pounds on November 9, down from 196.8 pounds October 24. Patient is status post extraction of several teeth and gastric feeding tube placement. Reports he is consuming about 2 Ensure Plus or equivalent daily. Attributes nothing by mouth status and liquids after extractions to be the reason for his 10 pound weight loss. Reports he is now eating a regular diet.  Nutrition diagnosis: Predicted suboptimal energy intake has evolved into inadequate oral intake related to tonsil cancer and associated treatments as evidenced by 10 pound weight loss over 3 weeks.  Intervention: Educated patient to consume small frequent meals and snacks with high-calorie, high-protein foods. Recommended patient continue Ensure Plus or equivalent 2-3 times daily. Instructed patient to try putting 1 can Osmolite 1.5 or Ensure Plus through tube with 60 cc water before and after feeding. Reviewed strategies for eating with nausea, vomiting. Questions were answered.  Teach back method used.  Monitoring, evaluation, goals: Patient will tolerate adequate calories and protein to minimize further loss of lean body mass.  Next visit: Monday, December 4, during infusion.  **Disclaimer: This note was dictated with voice recognition software. Similar sounding words can inadvertently be transcribed and this note may contain transcription errors which may not have been corrected upon publication of note.**

## 2016-03-24 NOTE — Progress Notes (Signed)
   Weekly Management Note:  Outpatient    ICD-9-CM ICD-10-CM   1. 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  CBCT/MVCT images/Port film x-rays were reviewed.  The chart was checked. No new complaints, started ChRT today.  Physical Findings:  Wt Readings from Last 3 Encounters:  03/24/16 187 lb 9.6 oz (85.1 kg)  03/20/16 187 lb 2 oz (84.9 kg)  03/17/16 193 lb (87.5 kg)    height is 5\' 10"  (1.778 m) and weight is 187 lb 9.6 oz (85.1 kg). His temperature is 97.8 F (36.6 C). His blood pressure is 130/79 and his pulse is 66. His oxygen saturation is 97%.  ulcerative left tonsil tumor CBC    Component Value Date/Time   WBC 6.2 03/20/2016 1200   RBC 4.16 (L) 03/20/2016 1200   HGB 12.5 (L) 03/20/2016 1200   HCT 35.2 (L) 03/20/2016 1200   PLT 467 (H) 03/20/2016 1200   MCV 84.6 03/20/2016 1200   MCH 30.0 03/20/2016 1200   MCHC 35.5 03/20/2016 1200   RDW 12.3 03/20/2016 1200   LYMPHSABS 1.2 03/20/2016 1200   MONOABS 0.5 03/20/2016 1200   EOSABS 0.1 03/20/2016 1200   BASOSABS 0.0 03/20/2016 1200     CMP     Component Value Date/Time   NA 139 03/20/2016 1200   K 3.9 03/20/2016 1200   CL 105 03/20/2016 1200   CO2 25 03/20/2016 1200   GLUCOSE 102 (H) 03/20/2016 1200   BUN 19 03/20/2016 1200   BUN 16.6 03/13/2016 0835   CREATININE 1.09 03/20/2016 1200   CREATININE 1.1 03/13/2016 0835   CALCIUM 9.7 03/20/2016 1200   GFRNONAA >60 03/20/2016 1200   GFRAA >60 03/20/2016 1200     Impression:  The patient is tolerating radiotherapy.   Plan:  Continue radiotherapy as planned.    -----------------------------------  Eppie Gibson, MD

## 2016-03-24 NOTE — Patient Instructions (Addendum)
[facility] Discharge Instructions for Patients Receiving Chemotherapy  Today you received the following chemotherapy agents: Cisplatin.  To help prevent nausea and vomiting after your treatment, we encourage you to take your nausea medication as prescribed. Make take Compazine starting today, and resume Zofran in 3 days.   If you develop nausea and vomiting that is not controlled by your nausea medication, call the clinic.   BELOW ARE SYMPTOMS THAT SHOULD BE REPORTED IMMEDIATELY:  *FEVER GREATER THAN 100.5 F  *CHILLS WITH OR WITHOUT FEVER  NAUSEA AND VOMITING THAT IS NOT CONTROLLED WITH YOUR NAUSEA MEDICATION  *UNUSUAL SHORTNESS OF BREATH  *UNUSUAL BRUISING OR BLEEDING  TENDERNESS IN MOUTH AND THROAT WITH OR WITHOUT PRESENCE OF ULCERS  *URINARY PROBLEMS  *BOWEL PROBLEMS  UNUSUAL RASH Items with * indicate a potential emergency and should be followed up as soon as possible.  Feel free to call the clinic you have any questions or concerns. The clinic phone number is [phone].  Please show the Success at check-in to the Emergency Department and triage nurse.   Cisplatin injection What is this medicine? CISPLATIN (SIS pla tin) is a chemotherapy drug. It targets fast dividing cells, like cancer cells, and causes these cells to die. This medicine is used to treat many types of cancer like bladder, ovarian, and testicular cancers. This medicine may be used for other purposes; ask your health care provider or pharmacist if you have questions. What should I tell my health care provider before I take this medicine? They need to know if you have any of these conditions: -blood disorders -hearing problems -kidney disease -recent or ongoing radiation therapy -an unusual or allergic reaction to cisplatin, carboplatin, other chemotherapy, other medicines, foods, dyes, or preservatives -pregnant or trying to get pregnant -breast-feeding How should I use  this medicine? This drug is given as an infusion into a vein. It is administered in a hospital or clinic by a specially trained health care professional. Talk to your pediatrician regarding the use of this medicine in children. Special care may be needed. Overdosage: If you think you have taken too much of this medicine contact a poison control center or emergency room at once. NOTE: This medicine is only for you. Do not share this medicine with others. What if I miss a dose? It is important not to miss a dose. Call your doctor or health care professional if you are unable to keep an appointment. What may interact with this medicine? -dofetilide -foscarnet -medicines for seizures -medicines to increase blood counts like filgrastim, pegfilgrastim, sargramostim -probenecid -pyridoxine used with altretamine -rituximab -some antibiotics like amikacin, gentamicin, neomycin, polymyxin B, streptomycin, tobramycin -sulfinpyrazone -vaccines -zalcitabine Talk to your doctor or health care professional before taking any of these medicines: -acetaminophen -aspirin -ibuprofen -ketoprofen -naproxen This list may not describe all possible interactions. Give your health care provider a list of all the medicines, herbs, non-prescription drugs, or dietary supplements you use. Also tell them if you smoke, drink alcohol, or use illegal drugs. Some items may interact with your medicine. What should I watch for while using this medicine? Your condition will be monitored carefully while you are receiving this medicine. You will need important blood work done while you are taking this medicine. This drug may make you feel generally unwell. This is not uncommon, as chemotherapy can affect healthy cells as well as cancer cells. Report any side effects. Continue your course of treatment even though you feel ill unless your  doctor tells you to stop. In some cases, you may be given additional medicines to help with  side effects. Follow all directions for their use. Call your doctor or health care professional for advice if you get a fever, chills or sore throat, or other symptoms of a cold or flu. Do not treat yourself. This drug decreases your body's ability to fight infections. Try to avoid being around people who are sick. This medicine may increase your risk to bruise or bleed. Call your doctor or health care professional if you notice any unusual bleeding. Be careful brushing and flossing your teeth or using a toothpick because you may get an infection or bleed more easily. If you have any dental work done, tell your dentist you are receiving this medicine. Avoid taking products that contain aspirin, acetaminophen, ibuprofen, naproxen, or ketoprofen unless instructed by your doctor. These medicines may hide a fever. Do not become pregnant while taking this medicine. Women should inform their doctor if they wish to become pregnant or think they might be pregnant. There is a potential for serious side effects to an unborn child. Talk to your health care professional or pharmacist for more information. Do not breast-feed an infant while taking this medicine. Drink fluids as directed while you are taking this medicine. This will help protect your kidneys. Call your doctor or health care professional if you get diarrhea. Do not treat yourself. What side effects may I notice from receiving this medicine? Side effects that you should report to your doctor or health care professional as soon as possible: -allergic reactions like skin rash, itching or hives, swelling of the face, lips, or tongue -signs of infection - fever or chills, cough, sore throat, pain or difficulty passing urine -signs of decreased platelets or bleeding - bruising, pinpoint red spots on the skin, black, tarry stools, nosebleeds -signs of decreased red blood cells - unusually weak or tired, fainting spells, lightheadedness -breathing  problems -changes in hearing -gout pain -low blood counts - This drug may decrease the number of white blood cells, red blood cells and platelets. You may be at increased risk for infections and bleeding. -nausea and vomiting -pain, swelling, redness or irritation at the injection site -pain, tingling, numbness in the hands or feet -problems with balance, movement -trouble passing urine or change in the amount of urine Side effects that usually do not require medical attention (report to your doctor or health care professional if they continue or are bothersome): -changes in vision -loss of appetite -metallic taste in the mouth or changes in taste This list may not describe all possible side effects. Call your doctor for medical advice about side effects. You may report side effects to FDA at 1-800-FDA-1088. Where should I keep my medicine? This drug is given in a hospital or clinic and will not be stored at home. NOTE: This sheet is a summary. It may not cover all possible information. If you have questions about this medicine, talk to your doctor, pharmacist, or health care provider.    2016, Elsevier/Gold Standard. (2007-08-03 14:40:54)

## 2016-03-24 NOTE — Progress Notes (Signed)
IMRT Device Note    ICD-9-CM ICD-10-CM   1. 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 The code is [phone].  -----------------------------------  Eppie Gibson, MD

## 2016-03-24 NOTE — Addendum Note (Signed)
Encounter addended by: Ernst Spell, RN on: 03/24/2016  4:28 PM<BR>    Actions taken: MAR administration accepted

## 2016-03-25 ENCOUNTER — Ambulatory Visit (HOSPITAL_BASED_OUTPATIENT_CLINIC_OR_DEPARTMENT_OTHER): Payer: BLUE CROSS/BLUE SHIELD

## 2016-03-25 ENCOUNTER — Telehealth: Payer: Self-pay | Admitting: *Deleted

## 2016-03-25 ENCOUNTER — Ambulatory Visit
Admission: RE | Admit: 2016-03-25 | Discharge: 2016-03-25 | Disposition: A | Discharge status: Home / Self Care | Payer: BLUE CROSS/BLUE SHIELD | Source: Ambulatory Visit | Attending: Radiation Oncology | Admitting: Radiation Oncology

## 2016-03-25 VITALS — BP 130/68 | HR 72 | Temp 99.9°F

## 2016-03-25 DIAGNOSIS — Z7982 Long term (current) use of aspirin: Secondary | ICD-10-CM | POA: Diagnosis not present

## 2016-03-25 DIAGNOSIS — Z809 Family history of malignant neoplasm, unspecified: Secondary | ICD-10-CM | POA: Diagnosis not present

## 2016-03-25 DIAGNOSIS — I10 Essential (primary) hypertension: Secondary | ICD-10-CM | POA: Diagnosis not present

## 2016-03-25 DIAGNOSIS — R1310 Dysphagia, unspecified: Secondary | ICD-10-CM | POA: Diagnosis not present

## 2016-03-25 DIAGNOSIS — C099 Malignant neoplasm of tonsil, unspecified: Secondary | ICD-10-CM

## 2016-03-25 DIAGNOSIS — E039 Hypothyroidism, unspecified: Secondary | ICD-10-CM | POA: Diagnosis not present

## 2016-03-25 DIAGNOSIS — Z510 Encounter for antineoplastic radiation therapy: Secondary | ICD-10-CM | POA: Diagnosis not present

## 2016-03-25 DIAGNOSIS — M1990 Unspecified osteoarthritis, unspecified site: Secondary | ICD-10-CM | POA: Diagnosis not present

## 2016-03-25 DIAGNOSIS — C090 Malignant neoplasm of tonsillar fossa: Secondary | ICD-10-CM | POA: Diagnosis not present

## 2016-03-25 DIAGNOSIS — Z79899 Other long term (current) drug therapy: Secondary | ICD-10-CM | POA: Diagnosis not present

## 2016-03-25 DIAGNOSIS — Z87891 Personal history of nicotine dependence: Secondary | ICD-10-CM | POA: Diagnosis not present

## 2016-03-25 DIAGNOSIS — E785 Hyperlipidemia, unspecified: Secondary | ICD-10-CM | POA: Diagnosis not present

## 2016-03-25 MED ORDER — SODIUM CHLORIDE 0.9 % IV SOLN
Freq: Once | INTRAVENOUS | Status: AC
  Administered 2016-03-25: 08:00:00 via INTRAVENOUS

## 2016-03-25 MED ORDER — HEPARIN SOD (PORK) LOCK FLUSH 100 UNIT/ML IV SOLN
500.0000 [IU] | Freq: Once | INTRAVENOUS | Status: AC
  Administered 2016-03-25: 500 [IU]
  Filled 2016-03-25: qty 5

## 2016-03-25 MED ORDER — SODIUM CHLORIDE 0.9% FLUSH
10.0000 mL | Freq: Once | INTRAVENOUS | Status: AC
  Administered 2016-03-25: 10 mL
  Filled 2016-03-25: qty 10

## 2016-03-25 NOTE — Telephone Encounter (Signed)
Oncology Nurse Navigator Documentation  Returned patient wife's phone call, informed disability and medical release of information forms have not been received.  She is to request again, I provided her [facility] fax number [phone].  Gayleen Orem, RN, BSN, [facility] [phone]

## 2016-03-25 NOTE — Patient Instructions (Signed)
Dehydration, Adult Dehydration is when there is not enough fluid or water in your body. This happens when you lose more fluids than you take in. Dehydration can range from mild to very bad. It should be treated right away to keep it from getting very bad. Symptoms of mild dehydration may include:   Thirst.  Dry lips.  Slightly dry mouth.  Dry, warm skin.  Dizziness. Symptoms of moderate dehydration may include:   Very dry mouth.  Muscle cramps.  Dark pee (urine). Pee may be the color of tea.  Your body making less pee.  Your eyes making fewer tears.  Heartbeat that is uneven or faster than normal (palpitations).  Headache.  Light-headedness, especially when you stand up from sitting.  Fainting (syncope). Symptoms of very bad dehydration may include:   Changes in skin, such as:  Cold and clammy skin.  Blotchy (mottled) or pale skin.  Skin that does not quickly return to normal after being lightly pinched and let go (poor skin turgor).  Changes in body fluids, such as:  Feeling very thirsty.  Your eyes making fewer tears.  Not sweating when body temperature is high, such as in hot weather.  Your body making very little pee.  Changes in vital signs, such as:  Weak pulse.  Pulse that is more than 100 beats a minute when you are sitting still.  Fast breathing.  Low blood pressure.  Other changes, such as:  Sunken eyes.  Cold hands and feet.  Confusion.  Lack of energy (lethargy).  Trouble waking up from sleep.  Short-term weight loss.  Unconsciousness. Follow these instructions at home:  If told by your doctor, drink an ORS:  Make an ORS by using instructions on the package.  Start by drinking small amounts, about  cup (120 mL) every 5-10 minutes.  Slowly drink more until you have had the amount that your doctor said to have.  Drink enough clear fluid to keep your pee clear or pale yellow. If you were told to drink an ORS, finish the  ORS first, then start slowly drinking clear fluids. Drink fluids such as:  Water. Do not drink only water by itself. Doing that can make the salt (sodium) level in your body get too low (hyponatremia).  Ice chips.  Fruit juice that you have added water to (diluted).  Low-calorie sports drinks.  Avoid:  Alcohol.  Drinks that have a lot of sugar. These include high-calorie sports drinks, fruit juice that does not have water added, and soda.  Caffeine.  Foods that are greasy or have a lot of fat or sugar.  Take over-the-counter and prescription medicines only as told by your doctor.  Do not take salt tablets. Doing that can make the salt level in your body get too high (hypernatremia).  Eat foods that have minerals (electrolytes). Examples include bananas, oranges, potatoes, tomatoes, and spinach.  Keep all follow-up visits as told by your doctor. This is important. Contact a doctor if:  You have belly (abdominal) pain that:  Gets worse.  Stays in one area (localizes).  You have a rash.  You have a stiff neck.  You get angry or annoyed more easily than normal (irritability).  You are more sleepy than normal.  You have a harder time waking up than normal.  You feel:  Weak.  Dizzy.  Very thirsty.  You have peed (urinated) only a small amount of very dark pee during 6-8 hours. Get help right away if:  You   have symptoms of very bad dehydration.  You cannot drink fluids without throwing up (vomiting).  Your symptoms get worse with treatment.  You have a fever.  You have a very bad headache.  You are throwing up or having watery poop (diarrhea) and it:  Gets worse.  Does not go away.  You have blood or something green (bile) in your throw-up.  You have blood in your poop (stool). This may cause poop to look black and tarry.  You have not peed in 6-8 hours.  You pass out (faint).  Your heart rate when you are sitting still is more than 100 beats a  minute.  You have trouble breathing. This information is not intended to replace advice given to you by your health care provider. Make sure you discuss any questions you have with your health care provider. Document Released: 02/22/2009 Document Revised: 11/16/2015 Document Reviewed: 06/22/2015 Elsevier Interactive Patient Education  2017 Elsevier Inc.  

## 2016-03-26 ENCOUNTER — Ambulatory Visit (HOSPITAL_BASED_OUTPATIENT_CLINIC_OR_DEPARTMENT_OTHER): Payer: BLUE CROSS/BLUE SHIELD

## 2016-03-26 ENCOUNTER — Ambulatory Visit
Admission: RE | Admit: 2016-03-26 | Discharge: 2016-03-26 | Disposition: A | Discharge status: Home / Self Care | Payer: BLUE CROSS/BLUE SHIELD | Source: Ambulatory Visit | Attending: Radiation Oncology | Admitting: Radiation Oncology

## 2016-03-26 VITALS — BP 143/89 | HR 58 | Temp 98.2°F | Resp 16

## 2016-03-26 DIAGNOSIS — Z7982 Long term (current) use of aspirin: Secondary | ICD-10-CM | POA: Diagnosis not present

## 2016-03-26 DIAGNOSIS — Z87891 Personal history of nicotine dependence: Secondary | ICD-10-CM | POA: Diagnosis not present

## 2016-03-26 DIAGNOSIS — C090 Malignant neoplasm of tonsillar fossa: Secondary | ICD-10-CM | POA: Diagnosis not present

## 2016-03-26 DIAGNOSIS — M1990 Unspecified osteoarthritis, unspecified site: Secondary | ICD-10-CM | POA: Diagnosis not present

## 2016-03-26 DIAGNOSIS — I10 Essential (primary) hypertension: Secondary | ICD-10-CM | POA: Diagnosis not present

## 2016-03-26 DIAGNOSIS — Z809 Family history of malignant neoplasm, unspecified: Secondary | ICD-10-CM | POA: Diagnosis not present

## 2016-03-26 DIAGNOSIS — E785 Hyperlipidemia, unspecified: Secondary | ICD-10-CM | POA: Diagnosis not present

## 2016-03-26 DIAGNOSIS — R1310 Dysphagia, unspecified: Secondary | ICD-10-CM | POA: Diagnosis not present

## 2016-03-26 DIAGNOSIS — Z79899 Other long term (current) drug therapy: Secondary | ICD-10-CM | POA: Diagnosis not present

## 2016-03-26 DIAGNOSIS — Z510 Encounter for antineoplastic radiation therapy: Secondary | ICD-10-CM | POA: Diagnosis not present

## 2016-03-26 DIAGNOSIS — E039 Hypothyroidism, unspecified: Secondary | ICD-10-CM | POA: Diagnosis not present

## 2016-03-26 DIAGNOSIS — C099 Malignant neoplasm of tonsil, unspecified: Secondary | ICD-10-CM | POA: Diagnosis not present

## 2016-03-26 MED ORDER — DEXAMETHASONE SODIUM PHOSPHATE 10 MG/ML IJ SOLN
10.0000 mg | Freq: Once | INTRAMUSCULAR | Status: AC
  Administered 2016-03-26: 10 mg via INTRAVENOUS

## 2016-03-26 MED ORDER — DEXAMETHASONE SODIUM PHOSPHATE 10 MG/ML IJ SOLN
INTRAMUSCULAR | Status: AC
  Filled 2016-03-26: qty 1

## 2016-03-26 MED ORDER — HEPARIN SOD (PORK) LOCK FLUSH 100 UNIT/ML IV SOLN
500.0000 [IU] | Freq: Once | INTRAVENOUS | Status: AC | PRN
  Administered 2016-03-26: 500 [IU]
  Filled 2016-03-26: qty 5

## 2016-03-26 MED ORDER — SODIUM CHLORIDE 0.9% FLUSH
10.0000 mL | INTRAVENOUS | Status: DC | PRN
Start: 2016-03-26 — End: 2016-03-26
  Administered 2016-03-26: 10 mL
  Filled 2016-03-26: qty 10

## 2016-03-26 MED ORDER — SODIUM CHLORIDE 0.9 % IV SOLN
INTRAVENOUS | Status: AC
  Administered 2016-03-26: 09:00:00 via INTRAVENOUS

## 2016-03-26 NOTE — Patient Instructions (Signed)
Nausea, Adult Introduction Feeling sick to your stomach (nausea) means that your stomach is upset or you feel like you have to throw up (vomit). Feeling sick to your stomach is usually not serious, but it may be an early sign of a more serious medical problem. As you feel sicker to your stomach, it can lead to throwing up (vomiting). If you throw up, or if you are not able to drink enough fluids, there is a risk of dehydration. Dehydration can make you feel tired and thirsty, have a dry mouth, and pee (urinate) less often. Older adults and people who have other diseases or a weak defense (immune) system have a higher risk of dehydration. The main goal of treating this condition is to:  Limit how often you feel sick to your stomach.  Prevent throwing up and dehydration. Follow these instructions at home: Follow instructions from your doctor about how to care for yourself at home. Eating and drinking Follow these recommendations as told by your doctor:  Take an oral rehydration solution (ORS). This is a drink that is sold at pharmacies and stores.  Drink clear fluids in small amounts as you are able, such as:  Water.  Ice chips.  Fruit juice that has water added (diluted fruit juice).  Low-calorie sports drinks.  Eat bland, easy to digest foods in small amounts as you are able, such as:  Bananas.  Applesauce.  Rice.  Lean meats.  Toast.  Crackers.  Avoid drinking fluids that contain a lot of sugar or caffeine.  Avoid alcohol.  Avoid spicy or fatty foods. General instructions  Drink enough fluid to keep your pee (urine) clear or pale yellow.  Wash your hands often. If you cannot use soap and water, use hand sanitizer.  Make sure that all people in your household wash their hands well and often.  Rest at home while you get better.  Take over-the-counter and prescription medicines only as told by your doctor.  Breathe slowly and deeply when you feel sick to your  stomach.  Watch your condition for any changes.  Keep all follow-up visits as told by your doctor. This is important. Contact a doctor if:  You have a headache.  You have new symptoms.  You feel sicker to your stomach.  You have a fever.  You feel light-headed or dizzy.  You throw up.  You are not able to keep fluids down. Get help right away if:  You have pain in your chest, neck, arm, or jaw.  You feel very weak or you pass out (faint).  You have throw up that is bright red or looks like coffee grounds.  You have bloody or black poop (stools), or poop that looks like tar.  You have a very bad headache, a stiff neck, or both.  You have very bad pain, cramping, or bloating in your belly.  You have a rash.  You have trouble breathing or you are breathing very quickly.  Your heart is beating very quickly.  Your skin feels cold and clammy.  You feel confused.  You have pain while peeing.  You have signs of dehydration, such as:  Dark pee, or very little or no pee.  Cracked lips.  Dry mouth.  Sunken eyes.  Sleepiness.  Weakness. These symptoms may be an emergency. Do not wait to see if the symptoms will go away. Get medical help right away. Call your local emergency services (911 in the [place] Do not drive yourself to the   hospital.  This information is not intended to replace advice given to you by your health care provider. Make sure you discuss any questions you have with your health care provider. Document Released: 04/17/2011 Document Revised: 10/04/2015 Document Reviewed: 01/02/2015  2017 Elsevier  

## 2016-03-27 ENCOUNTER — Ambulatory Visit
Admission: RE | Admit: 2016-03-27 | Discharge: 2016-03-27 | Disposition: A | Discharge status: Home / Self Care | Payer: BLUE CROSS/BLUE SHIELD | Source: Ambulatory Visit | Attending: Radiation Oncology | Admitting: Radiation Oncology

## 2016-03-27 DIAGNOSIS — I10 Essential (primary) hypertension: Secondary | ICD-10-CM | POA: Diagnosis not present

## 2016-03-27 DIAGNOSIS — E039 Hypothyroidism, unspecified: Secondary | ICD-10-CM | POA: Diagnosis not present

## 2016-03-27 DIAGNOSIS — Z79899 Other long term (current) drug therapy: Secondary | ICD-10-CM | POA: Diagnosis not present

## 2016-03-27 DIAGNOSIS — M1990 Unspecified osteoarthritis, unspecified site: Secondary | ICD-10-CM | POA: Diagnosis not present

## 2016-03-27 DIAGNOSIS — Z87891 Personal history of nicotine dependence: Secondary | ICD-10-CM | POA: Diagnosis not present

## 2016-03-27 DIAGNOSIS — Z510 Encounter for antineoplastic radiation therapy: Secondary | ICD-10-CM | POA: Diagnosis not present

## 2016-03-27 DIAGNOSIS — E785 Hyperlipidemia, unspecified: Secondary | ICD-10-CM | POA: Diagnosis not present

## 2016-03-27 DIAGNOSIS — C090 Malignant neoplasm of tonsillar fossa: Secondary | ICD-10-CM | POA: Diagnosis not present

## 2016-03-27 DIAGNOSIS — Z7982 Long term (current) use of aspirin: Secondary | ICD-10-CM | POA: Diagnosis not present

## 2016-03-27 DIAGNOSIS — Z809 Family history of malignant neoplasm, unspecified: Secondary | ICD-10-CM | POA: Diagnosis not present

## 2016-03-27 DIAGNOSIS — R1310 Dysphagia, unspecified: Secondary | ICD-10-CM | POA: Diagnosis not present

## 2016-03-28 ENCOUNTER — Telehealth: Payer: Self-pay | Admitting: *Deleted

## 2016-03-28 ENCOUNTER — Encounter: Payer: Self-pay | Admitting: *Deleted

## 2016-03-28 ENCOUNTER — Encounter: Payer: Self-pay | Admitting: Radiation Oncology

## 2016-03-28 ENCOUNTER — Ambulatory Visit: Payer: Self-pay | Admitting: Nurse Practitioner

## 2016-03-28 ENCOUNTER — Ambulatory Visit
Admission: RE | Admit: 2016-03-28 | Discharge: 2016-03-28 | Disposition: A | Discharge status: Home / Self Care | Payer: BLUE CROSS/BLUE SHIELD | Source: Ambulatory Visit | Attending: Radiation Oncology | Admitting: Radiation Oncology

## 2016-03-28 DIAGNOSIS — C090 Malignant neoplasm of tonsillar fossa: Secondary | ICD-10-CM | POA: Diagnosis not present

## 2016-03-28 DIAGNOSIS — Z7982 Long term (current) use of aspirin: Secondary | ICD-10-CM | POA: Diagnosis not present

## 2016-03-28 DIAGNOSIS — Z79899 Other long term (current) drug therapy: Secondary | ICD-10-CM | POA: Diagnosis not present

## 2016-03-28 DIAGNOSIS — Z510 Encounter for antineoplastic radiation therapy: Secondary | ICD-10-CM | POA: Diagnosis not present

## 2016-03-28 DIAGNOSIS — Z87891 Personal history of nicotine dependence: Secondary | ICD-10-CM | POA: Diagnosis not present

## 2016-03-28 DIAGNOSIS — E039 Hypothyroidism, unspecified: Secondary | ICD-10-CM | POA: Diagnosis not present

## 2016-03-28 DIAGNOSIS — R1310 Dysphagia, unspecified: Secondary | ICD-10-CM | POA: Diagnosis not present

## 2016-03-28 DIAGNOSIS — E785 Hyperlipidemia, unspecified: Secondary | ICD-10-CM | POA: Diagnosis not present

## 2016-03-28 DIAGNOSIS — M1990 Unspecified osteoarthritis, unspecified site: Secondary | ICD-10-CM | POA: Diagnosis not present

## 2016-03-28 DIAGNOSIS — I10 Essential (primary) hypertension: Secondary | ICD-10-CM | POA: Diagnosis not present

## 2016-03-28 DIAGNOSIS — Z809 Family history of malignant neoplasm, unspecified: Secondary | ICD-10-CM | POA: Diagnosis not present

## 2016-03-28 NOTE — Telephone Encounter (Signed)
Notified by Infusion RN, Tim,  Pt continues to c/o unrelieved nausea today.  Tim called pt at home this morning to check on him.  Sent urgent request to scheduler to add on for IVFs today in [facility].  Notified Gayleen Orem who will see pt today when he comes in for XRT.

## 2016-03-28 NOTE — Progress Notes (Signed)
FMLA paperwork received 03/28/2016, given to nursing, when form is completed I will return to [place] to fax and return to patient

## 2016-03-28 NOTE — Progress Notes (Signed)
Oncology Nurse Navigator Documentation  Met with Todd [name] following XRT.  He provided Disability paperwork to be completed by Drs. Ann Lions, signed Patient Information Release. I delivered Disability forms to [place], [employer], and [place], [place]; PIR to [place], [employer], for scanning.  I informed him an appt had been scheduled for IVF following today's XRT per his indication to Tim, RN, yesterday during follow-up call s/p chemo,of nausea.  He declined, stating he had to drive to [facility] for work.  I informed [facility] RN  Todd [name] and Dr. Calton Dach RN Todd [name] of his decision.  I encouraged him to take antiemetics as prescribed and to drink extra fluids.  He voiced understanding.  He reported conducting daily PEG flushes, denied any discomfort.  Todd Orem, RN, BSN, [place] at [place] [phone]

## 2016-03-30 ENCOUNTER — Inpatient Hospital Stay: Admission: RE | Admit: 2016-03-30 | Payer: Self-pay | Source: Ambulatory Visit

## 2016-03-30 ENCOUNTER — Ambulatory Visit
Admission: RE | Admit: 2016-03-30 | Discharge: 2016-03-30 | Disposition: A | Discharge status: Home / Self Care | Payer: BLUE CROSS/BLUE SHIELD | Source: Ambulatory Visit | Attending: Radiation Oncology | Admitting: Radiation Oncology

## 2016-03-30 DIAGNOSIS — M1990 Unspecified osteoarthritis, unspecified site: Secondary | ICD-10-CM | POA: Diagnosis not present

## 2016-03-30 DIAGNOSIS — R1310 Dysphagia, unspecified: Secondary | ICD-10-CM | POA: Diagnosis not present

## 2016-03-30 DIAGNOSIS — Z7982 Long term (current) use of aspirin: Secondary | ICD-10-CM | POA: Diagnosis not present

## 2016-03-30 DIAGNOSIS — Z809 Family history of malignant neoplasm, unspecified: Secondary | ICD-10-CM | POA: Diagnosis not present

## 2016-03-30 DIAGNOSIS — Z87891 Personal history of nicotine dependence: Secondary | ICD-10-CM | POA: Diagnosis not present

## 2016-03-30 DIAGNOSIS — Z79899 Other long term (current) drug therapy: Secondary | ICD-10-CM | POA: Diagnosis not present

## 2016-03-30 DIAGNOSIS — E039 Hypothyroidism, unspecified: Secondary | ICD-10-CM | POA: Diagnosis not present

## 2016-03-30 DIAGNOSIS — I10 Essential (primary) hypertension: Secondary | ICD-10-CM | POA: Diagnosis not present

## 2016-03-30 DIAGNOSIS — E785 Hyperlipidemia, unspecified: Secondary | ICD-10-CM | POA: Diagnosis not present

## 2016-03-30 DIAGNOSIS — Z510 Encounter for antineoplastic radiation therapy: Secondary | ICD-10-CM | POA: Diagnosis not present

## 2016-03-30 DIAGNOSIS — C090 Malignant neoplasm of tonsillar fossa: Secondary | ICD-10-CM | POA: Diagnosis not present

## 2016-03-31 ENCOUNTER — Ambulatory Visit: Payer: BLUE CROSS/BLUE SHIELD | Admitting: Nurse Practitioner

## 2016-03-31 ENCOUNTER — Telehealth: Payer: Self-pay | Admitting: Hematology and Oncology

## 2016-03-31 ENCOUNTER — Ambulatory Visit
Admission: RE | Admit: 2016-03-31 | Discharge: 2016-03-31 | Disposition: A | Discharge status: Home / Self Care | Payer: BLUE CROSS/BLUE SHIELD | Source: Ambulatory Visit | Attending: Radiation Oncology | Admitting: Radiation Oncology

## 2016-03-31 ENCOUNTER — Ambulatory Visit: Admission: RE | Admit: 2016-03-31 | Payer: BLUE CROSS/BLUE SHIELD | Source: Ambulatory Visit

## 2016-03-31 ENCOUNTER — Encounter: Payer: Self-pay | Admitting: Hematology and Oncology

## 2016-03-31 ENCOUNTER — Ambulatory Visit (HOSPITAL_BASED_OUTPATIENT_CLINIC_OR_DEPARTMENT_OTHER): Payer: BLUE CROSS/BLUE SHIELD | Admitting: Hematology and Oncology

## 2016-03-31 ENCOUNTER — Telehealth: Payer: Self-pay | Admitting: *Deleted

## 2016-03-31 ENCOUNTER — Encounter: Payer: Self-pay | Admitting: Radiation Oncology

## 2016-03-31 VITALS — BP 125/84 | HR 62 | Temp 97.8°F | Resp 17 | Ht 70.0 in | Wt 181.2 lb

## 2016-03-31 VITALS — Temp 98.9°F | Ht 70.0 in | Wt 178.0 lb

## 2016-03-31 DIAGNOSIS — H9313 Tinnitus, bilateral: Secondary | ICD-10-CM | POA: Insufficient documentation

## 2016-03-31 DIAGNOSIS — C099 Malignant neoplasm of tonsil, unspecified: Secondary | ICD-10-CM

## 2016-03-31 DIAGNOSIS — R634 Abnormal weight loss: Secondary | ICD-10-CM | POA: Diagnosis not present

## 2016-03-31 DIAGNOSIS — T451X5A Adverse effect of antineoplastic and immunosuppressive drugs, initial encounter: Secondary | ICD-10-CM

## 2016-03-31 DIAGNOSIS — R110 Nausea: Secondary | ICD-10-CM | POA: Insufficient documentation

## 2016-03-31 DIAGNOSIS — C090 Malignant neoplasm of tonsillar fossa: Secondary | ICD-10-CM

## 2016-03-31 MED ORDER — SODIUM CHLORIDE 0.9 % IV SOLN
Freq: Once | INTRAVENOUS | Status: AC
  Administered 2016-03-31: 10:00:00 via INTRAVENOUS

## 2016-03-31 MED ORDER — SODIUM CHLORIDE 0.9% FLUSH
10.0000 mL | INTRAVENOUS | Status: DC | PRN
  Administered 2016-03-31: 10 mL
  Filled 2016-03-31: qty 10

## 2016-03-31 MED ORDER — HEPARIN SOD (PORK) LOCK FLUSH 100 UNIT/ML IV SOLN
500.0000 [IU] | Freq: Once | INTRAVENOUS | Status: AC | PRN
  Administered 2016-03-31: 500 [IU]
  Filled 2016-03-31: qty 5

## 2016-03-31 NOTE — Patient Instructions (Signed)
Dehydration, Adult Dehydration is a condition in which there is not enough fluid or water in the body. This happens when you lose more fluids than you take in. Important organs, such as the kidneys, brain, and heart, cannot function without a proper amount of fluids. Any loss of fluids from the body can lead to dehydration. Dehydration can range from mild to severe. This condition should be treated right away to prevent it from becoming severe. What are the causes? This condition may be caused by:  Vomiting.  Diarrhea.  Excessive sweating, such as from heat exposure or exercise.  Not drinking enough fluid, especially:  When ill.  While doing activity that requires a lot of energy.  Excessive urination.  Fever.  Infection.  Certain medicines, such as medicines that cause the body to lose excess fluid (diuretics).  Inability to access safe drinking water.  Reduced physical ability to get adequate water and food. What increases the risk? This condition is more likely to develop in people:  Who have a poorly controlled long-term (chronic) illness, such as diabetes, heart disease, or kidney disease.  Who are age 65 or older.  Who are disabled.  Who live in a place with high altitude.  Who play endurance sports. What are the signs or symptoms? Symptoms of mild dehydration may include:   Thirst.  Dry lips.  Slightly dry mouth.  Dry, warm skin.  Dizziness. Symptoms of moderate dehydration may include:   Very dry mouth.  Muscle cramps.  Dark urine. Urine may be the color of tea.  Decreased urine production.  Decreased tear production.  Heartbeat that is irregular or faster than normal (palpitations).  Headache.  Light-headedness, especially when you stand up from a sitting position.  Fainting (syncope). Symptoms of severe dehydration may include:   Changes in skin, such as:  Cold and clammy skin.  Blotchy (mottled) or pale skin.  Skin that does  not quickly return to normal after being lightly pinched and released (poor skin turgor).  Changes in body fluids, such as:  Extreme thirst.  No tear production.  Inability to sweat when body temperature is high, such as in hot weather.  Very little urine production.  Changes in vital signs, such as:  Weak pulse.  Pulse that is more than 100 beats a minute when sitting still.  Rapid breathing.  Low blood pressure.  Other changes, such as:  Sunken eyes.  Cold hands and feet.  Confusion.  Lack of energy (lethargy).  Difficulty waking up from sleep.  Short-term weight loss.  Unconsciousness. How is this diagnosed? This condition is diagnosed based on your symptoms and a physical exam. Blood and urine tests may be done to help confirm the diagnosis. How is this treated? Treatment for this condition depends on the severity. Mild or moderate dehydration can often be treated at home. Treatment should be started right away. Do not wait until dehydration becomes severe. Severe dehydration is an emergency and it needs to be treated in a hospital. Treatment for mild dehydration may include:   Drinking more fluids.  Replacing salts and minerals in your blood (electrolytes) that you may have lost. Treatment for moderate dehydration may include:   Drinking an oral rehydration solution (ORS). This is a drink that helps you replace fluids and electrolytes (rehydrate). It can be found at pharmacies and retail stores. Treatment for severe dehydration may include:   Receiving fluids through an IV tube.  Receiving an electrolyte solution through a feeding tube that is   passed through your nose and into your stomach (nasogastric tube, or NG tube).  Correcting any abnormalities in electrolytes.  Treating the underlying cause of dehydration. Follow these instructions at home:  If directed by your health care provider, drink an ORS:  Make an ORS by following instructions on the  package.  Start by drinking small amounts, about  cup (120 mL) every 5-10 minutes.  Slowly increase how much you drink until you have taken the amount recommended by your health care provider.  Drink enough clear fluid to keep your urine clear or pale yellow. If you were told to drink an ORS, finish the ORS first, then start slowly drinking other clear fluids. Drink fluids such as:  Water. Do not drink only water. Doing that can lead to having too little salt (sodium) in the body (hyponatremia).  Ice chips.  Fruit juice that you have added water to (diluted fruit juice).  Low-calorie sports drinks.  Avoid:  Alcohol.  Drinks that contain a lot of sugar. These include high-calorie sports drinks, fruit juice that is not diluted, and soda.  Caffeine.  Foods that are greasy or contain a lot of fat or sugar.  Take over-the-counter and prescription medicines only as told by your health care provider.  Do not take sodium tablets. This can lead to having too much sodium in the body (hypernatremia).  Eat foods that contain a healthy balance of electrolytes, such as bananas, oranges, potatoes, tomatoes, and spinach.  Keep all follow-up visits as told by your health care provider. This is important. Contact a health care provider if:  You have abdominal pain that:  Gets worse.  Stays in one area (localizes).  You have a rash.  You have a stiff neck.  You are more irritable than usual.  You are sleepier or more difficult to wake up than usual.  You feel weak or dizzy.  You feel very thirsty.  You have urinated only a small amount of very dark urine over 6-8 hours. Get help right away if:  You have symptoms of severe dehydration.  You cannot drink fluids without vomiting.  Your symptoms get worse with treatment.  You have a fever.  You have a severe headache.  You have vomiting or diarrhea that:  Gets worse.  Does not go away.  You have blood or green matter  (bile) in your vomit.  You have blood in your stool. This may cause stool to look black and tarry.  You have not urinated in 6-8 hours.  You faint.  Your heart rate while sitting still is over 100 beats a minute.  You have trouble breathing. This information is not intended to replace advice given to you by your health care provider. Make sure you discuss any questions you have with your health care provider. Document Released: 04/28/2005 Document Revised: 11/23/2015 Document Reviewed: 06/22/2015 Elsevier Interactive Patient Education  2017 Elsevier Inc.  

## 2016-03-31 NOTE — Assessment & Plan Note (Signed)
He had profound nausea last week but that has resolved. I recommend IV fluids along with anti-emetics

## 2016-03-31 NOTE — Assessment & Plan Note (Signed)
The patient experienced profound nausea without multiple vomiting last week. He is doing better and able to hydrate himself. Nausea have resolved. He is a complaint of bilateral tinnitus. I will continue to reassess weekly and consider dose adjustment for cycle 2

## 2016-03-31 NOTE — Progress Notes (Signed)
   Weekly Management Note:  Outpatient    ICD-9-CM ICD-10-CM   1. 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  CBCT/MVCT images/Port film x-rays were reviewed.  The chart was checked. About to receive fraction 7.  Nausea and heartburn for past several days. Little intake, but not having diarrhea or vomiting. Famotidine and antinausea meds now helping.  Requests IV fluids today due to poor intake of water. Not orthostatic today Physical Findings:  Wt Readings from Last 3 Encounters:  03/31/16 178 lb (80.7 kg)  03/24/16 187 lb 9.6 oz (85.1 kg)  03/20/16 187 lb 2 oz (84.9 kg)    height is 5\' 10"  (1.778 m) and weight is 178 lb (80.7 kg). His temperature is 98.9 F (37.2 C). His oxygen saturation is 98%.  ulcerative left tonsil tumor improved.  White coating on tongue is not obvious thrush. Skin intact over neck  CBC    Component Value Date/Time   WBC 6.2 03/20/2016 1200   RBC 4.16 (L) 03/20/2016 1200   HGB 12.5 (L) 03/20/2016 1200   HCT 35.2 (L) 03/20/2016 1200   PLT 467 (H) 03/20/2016 1200   MCV 84.6 03/20/2016 1200   MCH 30.0 03/20/2016 1200   MCHC 35.5 03/20/2016 1200   RDW 12.3 03/20/2016 1200   LYMPHSABS 1.2 03/20/2016 1200   MONOABS 0.5 03/20/2016 1200   EOSABS 0.1 03/20/2016 1200   BASOSABS 0.0 03/20/2016 1200     CMP     Component Value Date/Time   NA 139 03/20/2016 1200   K 3.9 03/20/2016 1200   CL 105 03/20/2016 1200   CO2 25 03/20/2016 1200   GLUCOSE 102 (H) 03/20/2016 1200   BUN 19 03/20/2016 1200   BUN 16.6 03/13/2016 0835   CREATININE 1.09 03/20/2016 1200   CREATININE 1.1 03/13/2016 0835   CALCIUM 9.7 03/20/2016 1200   GFRNONAA >60 03/20/2016 1200   GFRAA >60 03/20/2016 1200     Impression:  The patient is tolerating radiotherapy.   Plan:  Continue radiotherapy as planned.  Nursing will call med/onc to inquire about IV fluid  scheduling for this AM. Continue anti nausea and anti reflux meds.  -----------------------------------  Eppie Gibson, MD

## 2016-03-31 NOTE — Telephone Encounter (Signed)
Message sent to infusion scheduler to be added. A copy of the AVS report & appointment schedule was given to patient,per 03/31/16 los.

## 2016-03-31 NOTE — Progress Notes (Signed)
[facility] OFFICE PROGRESS NOTE  Patient Care Team: Haywood Pao, MD as PCP - General (Internal Medicine) Leota Sauers, RN as Oncology Nurse Navigator Eppie Gibson, MD as Attending Physician (Radiation Oncology) Heath Lark, MD as Consulting Physician (Hematology and Oncology) Karie Mainland, RD as Dietitian (Nutrition)  SUMMARY OF ONCOLOGIC HISTORY:   Tonsil cancer ([place])   02/07/2016 Imaging    Ct neck showed abnormal soft tissue within the left pharynx, extending from the left tonsillar pillar inferiorly to the level of the hyoid and filling the left vallecula. The appearance is most consistent with a neoplastic process. 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 Histologic sampling and direct visualization should be considered. Confluent adenopathy within the left cervical chain with areas of central hypoattenuation suggesting degrees of necrosis. This is most suggestive of metastatic spread. The areas of necrosis within the adenopathy combined with the above-described pharyngeal soft tissue mass are together suggestive of squamous cell carcinoma.      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 Borderline hypermetabolic small right station IIb lymph node. Several tiny lung nodules are observed, these are not hypermetabolic but are also below the threshold level for accurate characterization. Dedicated CT chest may be warranted to act as a baseline.      03/20/2016 Procedure    Placement of a right internal jugular approach power injectable Port-A-Cath. Fluoroscopic insertion of a 20-French pull-through gastrostomy tube.        03/24/2016 -  Chemotherapy    He received high dose cisplatin      03/24/2016 -  Radiation Therapy    He received concurrent radiation       INTERVAL HISTORY: Please see below for problem oriented charting. He is seen today for toxicity review. He had profound nausea & vomiting last week. He continues to work. Over the weekend, he added some additional heartburn medicine and the nausea resolved.  He also complained of bilateral tinnitus. Denies pain. He started using his feeding tube over the weekend due to nausea and vomiting. Denies constipation. Denies recent hemoptysis  REVIEW OF SYSTEMS:   Constitutional: Denies fevers, chills or abnormal weight loss Eyes: Denies blurriness of vision Ears, nose, mouth, throat, and face: Denies mucositis or sore throat Respiratory: Denies cough, dyspnea or wheezes Cardiovascular: Denies palpitation, chest discomfort or lower extremity swelling Skin: Denies abnormal skin rashes Lymphatics: Denies new lymphadenopathy or easy bruising Neurological:Denies numbness, tingling or new weaknesses Behavioral/Psych: Mood is stable, no new changes  All other systems were reviewed with the patient and are negative.  I have reviewed the past medical history, past surgical history, social history and family history with the patient and they are unchanged from previous note.  ALLERGIES:  has No Known Allergies.  MEDICATIONS:  Current Outpatient Prescriptions  Medication Sig Dispense Refill  . acetaminophen (TYLENOL) 650 MG CR tablet Take 1,300 mg by mouth.    [name] amLODipine (NORVASC) 10 MG tablet Take 1 tablet (10 mg total) by mouth daily. 30 tablet 11  . aspirin EC 81 MG tablet Take by mouth.    . fenofibrate (TRICOR) 145 MG tablet     .  fentaNYL (DURAGESIC - DOSED MCG/HR) 25 MCG/HR patch Place 1 patch (25 mcg total) onto the skin every 3 (three) days. (Patient not taking: Reported on 03/31/2016) 5 patch 0  . GLUCOSAMINE SULFATE PO Take by mouth.    [name]  HYDROcodone-acetaminophen (NORCO) 10-325 MG tablet TAKE 1 TABLET BY MOUTH EVERY 4-6 HOURS AS NEEDED FOR PAIN  0  . levothyroxine (SYNTHROID, LEVOTHROID) 50 MCG tablet     . lidocaine-prilocaine (EMLA) cream Apply to affected area once 30 g 3  . losartan (COZAAR) 100 MG tablet     . morphine (ROXANOL) 20 MG/ML concentrated solution Take 0.5 mLs (10 mg total) by mouth every 2 (two) hours as needed for severe pain. (Patient not taking: Reported on 03/31/2016) 120 mL 0  . Multiple Vitamin (MULTIVITAMIN) capsule Take by mouth.    . ondansetron (ZOFRAN) 8 MG tablet Take 1 tablet (8 mg total) by mouth 2 (two) times daily as needed. Start on the third day after chemotherapy. 30 tablet 1  . prochlorperazine (COMPAZINE) 10 MG tablet Take 1 tablet (10 mg total) by mouth every 6 (six) hours as needed (Nausea or vomiting). (Patient not taking: Reported on 03/31/2016) 30 tablet 1  . sodium fluoride (FLUORISHIELD) 1.1 % GEL dental gel Instill one drop of gel per tooth space of fluoride tray. Place over teeth for 5 minutes. Remove. Spit out excess. Repeat nightly. 120 mL prn   No current facility-administered medications for this visit.     PHYSICAL EXAMINATION: ECOG PERFORMANCE STATUS: 1 - Symptomatic but completely ambulatory GENERAL:alert, no distress and comfortable SKIN: skin color, texture, turgor are normal, no rashes or significant lesions EYES: normal, Conjunctiva are pink and non-injected, sclera clear OROPHARYNX:no exudate, no erythema and lips, buccal mucosa, and tongue normal  NECK: supple, thyroid normal size, non-tender, without nodularity LYMPH:  He has persistent bilateral lymphadenopathy, smaller in size  LUNGS: clear to auscultation and percussion with normal breathing effort HEART: regular rate & rhythm and no murmurs and no lower extremity edema ABDOMEN:abdomen soft, non-tender and normal bowel sounds. Feeding tube site looks okay Musculoskeletal:no cyanosis of digits and no clubbing   NEURO: alert & oriented x 3 with fluent speech, no focal motor/sensory deficits  LABORATORY DATA:  I have reviewed the data as listed    Component Value Date/Time   NA 139 03/20/2016 1200   K 3.9 03/20/2016 1200   CL 105 03/20/2016 1200   CO2 25 03/20/2016 1200   GLUCOSE 102 (H) 03/20/2016 1200   BUN 19 03/20/2016 1200   BUN 16.6 03/13/2016 0835   CREATININE 1.09 03/20/2016 1200   CREATININE 1.1 03/13/2016 0835   CALCIUM 9.7 03/20/2016 1200   GFRNONAA >60 03/20/2016 1200   GFRAA >60 03/20/2016 1200    No results found for: SPEP, UPEP  Lab Results  Component Value Date   WBC 6.2 03/20/2016   NEUTROABS 4.4 03/20/2016   HGB 12.5 (L) 03/20/2016   HCT 35.2 (L) 03/20/2016   MCV 84.6 03/20/2016   PLT 467 (H) 03/20/2016      Chemistry      Component Value Date/Time   NA 139 03/20/2016 1200   K 3.9 03/20/2016 1200   CL 105 03/20/2016 1200   CO2 25 03/20/2016 1200   BUN 19 03/20/2016 1200   BUN 16.6 03/13/2016 0835   CREATININE 1.09 03/20/2016 1200   CREATININE 1.1 03/13/2016 0835      Component Value Date/Time   CALCIUM 9.7 03/20/2016 1200     ASSESSMENT &  PLAN:  Tonsil cancer ([place]) The patient experienced profound nausea without multiple vomiting last week. He is doing better and able to hydrate himself. Nausea have resolved. He is a complaint of bilateral tinnitus. I will continue to reassess weekly and consider dose adjustment for cycle 2  Weight loss He has unintentional weight loss due to poor appetite and nausea last week. He is using his feeding tube for nutrition and I encouraged him to do so  Chemotherapy-induced nausea He had profound nausea last week but that has resolved. I recommend IV fluids along with anti-emetics  Bilateral tinnitus This is related to recent side effects of chemotherapy. I will continue to assess and consider dose adjustment for cycle 2 of treatment   No orders of the defined types were placed in this encounter.  All  questions were answered. The patient knows to call the clinic with any problems, questions or concerns. No barriers to learning was detected. I spent 20 minutes counseling the patient face to face. The total time spent in the appointment was 25 minutes and more than 50% was on counseling and review of test results     Heath Lark, MD 03/31/2016 11:27 AM

## 2016-03-31 NOTE — Telephone Encounter (Signed)
Per LOS I have scheduled appts and notified the scheduler 

## 2016-03-31 NOTE — Assessment & Plan Note (Signed)
He has unintentional weight loss due to poor appetite and nausea last week. He is using his feeding tube for nutrition and I encouraged him to do so

## 2016-03-31 NOTE — Assessment & Plan Note (Signed)
This is related to recent side effects of chemotherapy. I will continue to assess and consider dose adjustment for cycle 2 of treatment

## 2016-03-31 NOTE — Progress Notes (Signed)
Mr. Bucceri is here before his 7th fraction of radiation to his Left Tonsil and bilateral neck. He denies pain. He does have some fatigue. He reports feeling badly yesterday. He was not able to drink or eat well due to nausea and acid reflux after chemotherapy last week. He went home after radiation yesterday and rested. He also was able to use his feeding tube and instill water and about one can of Boost. He also began taking pepcid yesterday, and tells me he is feeling much better today. He is asking to receive IV fluids today. He does report thick saliva that is green color in the morning that clears by evening. The skin to his neck is normal appearing, and he does plan to start using the sonafine cream provided.   Temp 98.9 F (37.2 C)   Ht 5\' 10"  (1.778 m)   Wt 178 lb (80.7 kg)   SpO2 98% Comment: room air  BMI 25.54 kg/m    Orthostatics: BP sitting 124/83 pulse 70. BP standing 125/85 pulse 77  Wt Readings from Last 3 Encounters:  03/31/16 178 lb (80.7 kg)  03/24/16 187 lb 9.6 oz (85.1 kg)  03/20/16 187 lb 2 oz (84.9 kg)

## 2016-04-01 ENCOUNTER — Telehealth: Payer: Self-pay

## 2016-04-01 ENCOUNTER — Encounter: Payer: Self-pay | Admitting: Radiation Oncology

## 2016-04-01 ENCOUNTER — Ambulatory Visit: Payer: BLUE CROSS/BLUE SHIELD

## 2016-04-01 NOTE — Telephone Encounter (Signed)
Liliane Channel, navigator dropped off [employer] paperwork for Alvy Bimler to sign, paperwork filled out and provided back to [place] per request.

## 2016-04-01 NOTE — Progress Notes (Signed)
Paperwork ([employer]) received from nursing, given to [place] to fax and give copy to patient 11/21

## 2016-04-02 ENCOUNTER — Encounter: Payer: Self-pay | Admitting: *Deleted

## 2016-04-02 ENCOUNTER — Ambulatory Visit
Admission: RE | Admit: 2016-04-02 | Discharge: 2016-04-02 | Disposition: A | Discharge status: Home / Self Care | Payer: BLUE CROSS/BLUE SHIELD | Source: Ambulatory Visit | Attending: Radiation Oncology | Admitting: Radiation Oncology

## 2016-04-02 DIAGNOSIS — Z809 Family history of malignant neoplasm, unspecified: Secondary | ICD-10-CM | POA: Diagnosis not present

## 2016-04-02 DIAGNOSIS — R1310 Dysphagia, unspecified: Secondary | ICD-10-CM | POA: Diagnosis not present

## 2016-04-02 DIAGNOSIS — E039 Hypothyroidism, unspecified: Secondary | ICD-10-CM | POA: Diagnosis not present

## 2016-04-02 DIAGNOSIS — Z510 Encounter for antineoplastic radiation therapy: Secondary | ICD-10-CM | POA: Diagnosis not present

## 2016-04-02 DIAGNOSIS — Z79899 Other long term (current) drug therapy: Secondary | ICD-10-CM | POA: Diagnosis not present

## 2016-04-02 DIAGNOSIS — E785 Hyperlipidemia, unspecified: Secondary | ICD-10-CM | POA: Diagnosis not present

## 2016-04-02 DIAGNOSIS — M1990 Unspecified osteoarthritis, unspecified site: Secondary | ICD-10-CM | POA: Diagnosis not present

## 2016-04-02 DIAGNOSIS — C090 Malignant neoplasm of tonsillar fossa: Secondary | ICD-10-CM | POA: Diagnosis not present

## 2016-04-02 DIAGNOSIS — I10 Essential (primary) hypertension: Secondary | ICD-10-CM | POA: Diagnosis not present

## 2016-04-02 DIAGNOSIS — Z87891 Personal history of nicotine dependence: Secondary | ICD-10-CM | POA: Diagnosis not present

## 2016-04-02 DIAGNOSIS — Z7982 Long term (current) use of aspirin: Secondary | ICD-10-CM | POA: Diagnosis not present

## 2016-04-02 NOTE — Progress Notes (Signed)
Oncology Nurse Navigator Documentation  Met with Mr. Economou following XRT, provided him 2 packages of mesh briefs for securing PEG tube. He reported:  Flushing PEG daily with incident.  Sense of taste faltering but he is trying to force intake.  Today is final day of work, he's looking forward to focusing on treatment. He understands I can be contacted with needs/concerns.  Gayleen Orem, RN, BSN, [place] at [place] [phone]

## 2016-04-04 ENCOUNTER — Ambulatory Visit: Payer: BLUE CROSS/BLUE SHIELD

## 2016-04-07 ENCOUNTER — Ambulatory Visit
Admission: RE | Admit: 2016-04-07 | Discharge: 2016-04-07 | Disposition: A | Discharge status: Home / Self Care | Payer: BLUE CROSS/BLUE SHIELD | Source: Ambulatory Visit | Attending: Radiation Oncology | Admitting: Radiation Oncology

## 2016-04-07 ENCOUNTER — Encounter: Payer: Self-pay | Admitting: Radiation Oncology

## 2016-04-07 VITALS — Temp 98.4°F | Wt 169.8 lb

## 2016-04-07 DIAGNOSIS — Z79899 Other long term (current) drug therapy: Secondary | ICD-10-CM | POA: Diagnosis not present

## 2016-04-07 DIAGNOSIS — E785 Hyperlipidemia, unspecified: Secondary | ICD-10-CM | POA: Diagnosis not present

## 2016-04-07 DIAGNOSIS — Z7982 Long term (current) use of aspirin: Secondary | ICD-10-CM | POA: Diagnosis not present

## 2016-04-07 DIAGNOSIS — Z87891 Personal history of nicotine dependence: Secondary | ICD-10-CM | POA: Diagnosis not present

## 2016-04-07 DIAGNOSIS — C090 Malignant neoplasm of tonsillar fossa: Secondary | ICD-10-CM | POA: Diagnosis not present

## 2016-04-07 DIAGNOSIS — R1310 Dysphagia, unspecified: Secondary | ICD-10-CM | POA: Diagnosis not present

## 2016-04-07 DIAGNOSIS — I10 Essential (primary) hypertension: Secondary | ICD-10-CM | POA: Diagnosis not present

## 2016-04-07 DIAGNOSIS — Z510 Encounter for antineoplastic radiation therapy: Secondary | ICD-10-CM | POA: Diagnosis not present

## 2016-04-07 DIAGNOSIS — E039 Hypothyroidism, unspecified: Secondary | ICD-10-CM | POA: Diagnosis not present

## 2016-04-07 DIAGNOSIS — M1990 Unspecified osteoarthritis, unspecified site: Secondary | ICD-10-CM | POA: Diagnosis not present

## 2016-04-07 DIAGNOSIS — Z809 Family history of malignant neoplasm, unspecified: Secondary | ICD-10-CM | POA: Diagnosis not present

## 2016-04-07 MED ORDER — SUCRALFATE 1 G PO TABS: ORAL_TABLET | ORAL | 3 refills | Status: DC

## 2016-04-07 MED ORDER — LIDOCAINE VISCOUS 2 % MT SOLN: OROMUCOSAL | 3 refills | Status: DC

## 2016-04-07 MED FILL — LIDOCAINE 2% VISCOUS SOLN: 2 | 10 days supply | Qty: 200 | Fill #0

## 2016-04-07 MED FILL — SUCRALFATE 1 GM TABLET: 1 | 15 days supply | Qty: 60 | Fill #0

## 2016-04-07 NOTE — Progress Notes (Signed)
Todd [name] is here for his 8th fraction of radiation to his Left Tonsil and Bilateral Neck. He denies pain. He has some fatigue, but is feeling better than last week. He has not had nausea. He is eating food orally. He is eating more everyday per his report. He is drinking 1 1/2 ensure daily. He has lost 4 lbs since last week, but feels like he actually lost more weight and gained some back. He is drinking about 64 ounces of water daily. He is not currently instilling any nutrition or water in his feeding tube. He having normal bowel movements. His neck is slightly red, he has not started using sonafine at this time, but is aware he needs to start this week.   Temp 98.4 F (36.9 C)   Wt 169 lb 12.8 oz (77 kg)   SpO2 100% Comment: room air  BMI 24.36 kg/m    Orthostatics: Sitting BP 116/78, pulse 65. Standing BP 104/81 pulse 62.   Wt Readings from Last 3 Encounters:  04/07/16 169 lb 12.8 oz (77 kg)  03/31/16 181 lb 3.2 oz (82.2 kg)  03/31/16 178 lb (80.7 kg)

## 2016-04-07 NOTE — Progress Notes (Signed)
   Weekly Management Note:  Outpatient    ICD-9-CM ICD-10-CM   1. 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  CBCT/MVCT images/Port film x-rays were reviewed.  The chart was checked. He missed some treatments last week due to work obligations, machine issues, and lack of flexibility to wait for RT later in the day. He will get a BID tx this week.  He has mild soreness in the throat.  All food is PO.  Regaining lost weight since nausea, he reports. Not orthostatic   Physical Findings:  Wt Readings from Last 3 Encounters:  04/07/16 169 lb 12.8 oz (77 kg)  03/31/16 181 lb 3.2 oz (82.2 kg)  03/31/16 178 lb (80.7 kg)    weight is 169 lb 12.8 oz (77 kg). His temperature is 98.4 F (36.9 C). His oxygen saturation is 100%.  ulcerative left tonsil tumor stable.  Skin intact over neck  CBC    Component Value Date/Time   WBC 6.2 03/20/2016 1200   RBC 4.16 (L) 03/20/2016 1200   HGB 12.5 (L) 03/20/2016 1200   HCT 35.2 (L) 03/20/2016 1200   PLT 467 (H) 03/20/2016 1200   MCV 84.6 03/20/2016 1200   MCH 30.0 03/20/2016 1200   MCHC 35.5 03/20/2016 1200   RDW 12.3 03/20/2016 1200   LYMPHSABS 1.2 03/20/2016 1200   MONOABS 0.5 03/20/2016 1200   EOSABS 0.1 03/20/2016 1200   BASOSABS 0.0 03/20/2016 1200     CMP     Component Value Date/Time   NA 139 03/20/2016 1200   K 3.9 03/20/2016 1200   CL 105 03/20/2016 1200   CO2 25 03/20/2016 1200   GLUCOSE 102 (H) 03/20/2016 1200   BUN 19 03/20/2016 1200   BUN 16.6 03/13/2016 0835   CREATININE 1.09 03/20/2016 1200   CREATININE 1.1 03/13/2016 0835   CALCIUM 9.7 03/20/2016 1200   GFRNONAA >60 03/20/2016 1200   GFRAA >60 03/20/2016 1200     Impression:  The patient is tolerating radiotherapy.   Plan:  Continue radiotherapy as planned.   Rx sucralfate and lidocaine for esophagitis. Reiterated to patient the  importance of attending all treatments for best efficacy. -----------------------------------  Eppie Gibson, MD

## 2016-04-07 NOTE — Progress Notes (Signed)
Paperwork ([employer]) received back from doctors (med onc and rad onc) faxed to [facility] @ [phone], confirmation received, copy given to Liliane Channel to give to patient 11/27

## 2016-04-08 ENCOUNTER — Encounter: Payer: Self-pay | Admitting: Hematology and Oncology

## 2016-04-08 ENCOUNTER — Telehealth: Payer: Self-pay | Admitting: Internal Medicine

## 2016-04-08 ENCOUNTER — Telehealth: Payer: Self-pay | Admitting: *Deleted

## 2016-04-08 ENCOUNTER — Ambulatory Visit
Admission: RE | Admit: 2016-04-08 | Discharge: 2016-04-08 | Disposition: A | Discharge status: Home / Self Care | Payer: BLUE CROSS/BLUE SHIELD | Source: Ambulatory Visit | Attending: Radiation Oncology | Admitting: Radiation Oncology

## 2016-04-08 ENCOUNTER — Ambulatory Visit (HOSPITAL_BASED_OUTPATIENT_CLINIC_OR_DEPARTMENT_OTHER): Payer: BLUE CROSS/BLUE SHIELD | Admitting: Hematology and Oncology

## 2016-04-08 DIAGNOSIS — Z7982 Long term (current) use of aspirin: Secondary | ICD-10-CM | POA: Diagnosis not present

## 2016-04-08 DIAGNOSIS — Z87891 Personal history of nicotine dependence: Secondary | ICD-10-CM | POA: Diagnosis not present

## 2016-04-08 DIAGNOSIS — E039 Hypothyroidism, unspecified: Secondary | ICD-10-CM | POA: Diagnosis not present

## 2016-04-08 DIAGNOSIS — Z79899 Other long term (current) drug therapy: Secondary | ICD-10-CM | POA: Diagnosis not present

## 2016-04-08 DIAGNOSIS — I10 Essential (primary) hypertension: Secondary | ICD-10-CM | POA: Diagnosis not present

## 2016-04-08 DIAGNOSIS — C099 Malignant neoplasm of tonsil, unspecified: Secondary | ICD-10-CM | POA: Diagnosis not present

## 2016-04-08 DIAGNOSIS — H9313 Tinnitus, bilateral: Secondary | ICD-10-CM

## 2016-04-08 DIAGNOSIS — Z510 Encounter for antineoplastic radiation therapy: Secondary | ICD-10-CM | POA: Diagnosis not present

## 2016-04-08 DIAGNOSIS — R6884 Jaw pain: Secondary | ICD-10-CM | POA: Diagnosis not present

## 2016-04-08 DIAGNOSIS — Z809 Family history of malignant neoplasm, unspecified: Secondary | ICD-10-CM | POA: Diagnosis not present

## 2016-04-08 DIAGNOSIS — C090 Malignant neoplasm of tonsillar fossa: Secondary | ICD-10-CM | POA: Diagnosis not present

## 2016-04-08 DIAGNOSIS — E785 Hyperlipidemia, unspecified: Secondary | ICD-10-CM | POA: Diagnosis not present

## 2016-04-08 DIAGNOSIS — R1310 Dysphagia, unspecified: Secondary | ICD-10-CM | POA: Diagnosis not present

## 2016-04-08 DIAGNOSIS — R634 Abnormal weight loss: Secondary | ICD-10-CM

## 2016-04-08 DIAGNOSIS — M1990 Unspecified osteoarthritis, unspecified site: Secondary | ICD-10-CM | POA: Diagnosis not present

## 2016-04-08 NOTE — Progress Notes (Signed)
[place] OFFICE PROGRESS NOTE  Patient Care Team: Haywood Pao, MD as PCP - General (Internal Medicine) Leota Sauers, RN as Oncology Nurse Navigator Eppie Gibson, MD as Attending Physician (Radiation Oncology) Heath Lark, MD as Consulting Physician (Hematology and Oncology) Karie Mainland, RD as Dietitian (Nutrition)  SUMMARY OF ONCOLOGIC HISTORY:   Tonsil cancer ([place])   02/07/2016 Imaging    Ct neck showed abnormal soft tissue within the left pharynx, extending from the left tonsillar pillar inferiorly to the level of the hyoid and filling the left vallecula. The appearance is most consistent with a neoplastic process. 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 Histologic sampling and direct visualization should be considered. Confluent adenopathy within the left cervical chain with areas of central hypoattenuation suggesting degrees of necrosis. This is most suggestive of metastatic spread. The areas of necrosis within the adenopathy combined with the above-described pharyngeal soft tissue mass are together suggestive of squamous cell carcinoma.      02/14/2016 Procedure    He has FNA of left neck LN       02/14/2016 Pathology Results    [id number] cytology confirmed squamous cell carcinoma      02/26/2016 PET scan    Hypermetabolic left tonsillar in pharyngeal mass with hypermetabolic left sided station IA, station III, and station Ib adenopathy and hypermetabolic station IIa adenopathy on the right. Borderline hypermetabolic small right station IIb lymph node. Several tiny lung nodules are observed, these are not hypermetabolic but are also below the threshold level for accurate characterization. Dedicated CT chest may be warranted to act as a baseline.      03/20/2016 Procedure    Placement of a right internal jugular approach power injectable Port-A-Cath. Fluoroscopic insertion of a 20-French pull-through gastrostomy tube.        03/24/2016 -  Chemotherapy    He received high dose cisplatin      03/24/2016 -  Radiation Therapy    He received concurrent radiation       INTERVAL HISTORY: Please see below for problem oriented charting. He is seen for supportive care visit, prior to cycle 2 of treatment. He started to gain some weight back but continues to have poor appetite due to altered taste sensation. He had minor jaw pain, not severe enough to warrant starting on narcotic prescription. Denies nausea or constipation this week. The bilateral tinnitus has mildly improved.  REVIEW OF SYSTEMS:   Constitutional: Denies fevers, chills  Eyes: Denies blurriness of vision Ears, nose, mouth, throat, and face: Denies mucositis or sore throat Respiratory: Denies cough, dyspnea or wheezes Cardiovascular: Denies palpitation, chest discomfort or lower extremity swelling Gastrointestinal:  Denies nausea, heartburn or change in bowel habits Skin: Denies abnormal skin rashes Lymphatics: Denies new lymphadenopathy or easy bruising Neurological:Denies numbness, tingling or new weaknesses Behavioral/Psych: Mood is stable, no new changes  All other systems were reviewed with the patient and are negative.  I have reviewed the past medical history, past surgical history, social history and family history with the patient and they are unchanged from previous note.  ALLERGIES:  has No Known Allergies.  MEDICATIONS:  Current Outpatient Prescriptions  Medication Sig Dispense Refill  . acetaminophen (TYLENOL) 650 MG CR tablet Take 1,300 mg by mouth.    [name] amLODipine (NORVASC) 10 MG tablet Take 1 tablet (10 mg total) by mouth daily. 30 tablet 11  . aspirin EC 81 MG tablet Take by mouth.    . fenofibrate (TRICOR) 145 MG  tablet     . fentaNYL (DURAGESIC - DOSED MCG/HR) 25 MCG/HR patch Place 1 patch (25 mcg total) onto the skin every 3 (three) days. 5 patch 0  . GLUCOSAMINE SULFATE PO Take by mouth.    [name] HYDROcodone-acetaminophen  (NORCO) 10-325 MG tablet TAKE 1 TABLET BY MOUTH EVERY 4-6 HOURS AS NEEDED FOR PAIN  0  . levothyroxine (SYNTHROID, LEVOTHROID) 50 MCG tablet     . lidocaine (XYLOCAINE) 2 % solution Patient: Mix 1part 2% viscous lidocaine, 1part H20. Swish/swallow 66mL of this mixture, 45min before meals and at bedtime, up to QID 200 mL 3  . morphine (ROXANOL) 20 MG/ML concentrated solution Take 0.5 mLs (10 mg total) by mouth every 2 (two) hours as needed for severe pain. 120 mL 0  . Multiple Vitamin (MULTIVITAMIN) capsule Take by mouth.    . ondansetron (ZOFRAN) 8 MG tablet Take 1 tablet (8 mg total) by mouth 2 (two) times daily as needed. Start on the third day after chemotherapy. 30 tablet 1  . prochlorperazine (COMPAZINE) 10 MG tablet Take 1 tablet (10 mg total) by mouth every 6 (six) hours as needed (Nausea or vomiting). 30 tablet 1  . sodium fluoride (FLUORISHIELD) 1.1 % GEL dental gel Instill one drop of gel per tooth space of fluoride tray. Place over teeth for 5 minutes. Remove. Spit out excess. Repeat nightly. 120 mL prn  . sucralfate (CARAFATE) 1 g tablet Dissolve 1 tablet in 10 mL H20 and swallow QID prn sore throat. 60 tablet 3  . lidocaine-prilocaine (EMLA) cream Apply to affected area once 30 g 3   No current facility-administered medications for this visit.     PHYSICAL EXAMINATION: ECOG PERFORMANCE STATUS: 1 - Symptomatic but completely ambulatory  Vitals:   04/08/16 0852  BP: 116/69  Pulse: 64  Resp: 16  Temp: 98 F (36.7 C)   Filed Weights   04/08/16 0852  Weight: 177 lb 8 oz (80.5 kg)    GENERAL:alert, no distress and comfortable SKIN: skin color, texture, turgor are normal, no rashes or significant lesions EYES: normal, Conjunctiva are pink and non-injected, sclera clear OROPHARYNX:no exudate, no erythema and lips, buccal mucosa, and tongue normal  NECK: supple, thyroid normal size, non-tender, without nodularity LYMPH:  The palpable lymphadenopathy has regressed in  size LUNGS: clear to auscultation and percussion with normal breathing effort HEART: regular rate & rhythm and no murmurs and no lower extremity edema ABDOMEN:abdomen soft, non-tender and normal bowel sounds Musculoskeletal:no cyanosis of digits and no clubbing  NEURO: alert & oriented x 3 with fluent speech, no focal motor/sensory deficits  LABORATORY DATA:  I have reviewed the data as listed    Component Value Date/Time   NA 139 03/20/2016 1200   K 3.9 03/20/2016 1200   CL 105 03/20/2016 1200   CO2 25 03/20/2016 1200   GLUCOSE 102 (H) 03/20/2016 1200   BUN 19 03/20/2016 1200   BUN 16.6 03/13/2016 0835   CREATININE 1.09 03/20/2016 1200   CREATININE 1.1 03/13/2016 0835   CALCIUM 9.7 03/20/2016 1200   GFRNONAA >60 03/20/2016 1200   GFRAA >60 03/20/2016 1200    No results found for: SPEP, UPEP  Lab Results  Component Value Date   WBC 6.2 03/20/2016   NEUTROABS 4.4 03/20/2016   HGB 12.5 (L) 03/20/2016   HCT 35.2 (L) 03/20/2016   MCV 84.6 03/20/2016   PLT 467 (H) 03/20/2016      Chemistry      Component Value Date/Time  NA 139 03/20/2016 1200   K 3.9 03/20/2016 1200   CL 105 03/20/2016 1200   CO2 25 03/20/2016 1200   BUN 19 03/20/2016 1200   BUN 16.6 03/13/2016 0835   CREATININE 1.09 03/20/2016 1200   CREATININE 1.1 03/13/2016 0835      Component Value Date/Time   CALCIUM 9.7 03/20/2016 1200     ASSESSMENT & PLAN:  Tonsil cancer ([place]) The patient experienced profound nausea without multiple vomiting last week. He is doing better now and able to hydrate himself. Nausea have resolved. He is a complaint of bilateral tinnitus. I plan to reduce the dose of chemotherapy in the future by 20%  Bilateral tinnitus This is related to recent side effects of chemotherapy. I will plan on dose adjustment for cycle 2 of treatment  Weight loss He has unintentional weight loss due to recent poor appetite and altered taste sensation. He is using his feeding tube for  nutrition and I encouraged him to do so  Essential hypertension His blood pressure is now low normal. I recommend he stops amlodipine.  Jaw pain He has minor jaw pain after radiation treatment. I recommend over-the-counter analgesics as needed   No orders of the defined types were placed in this encounter.  All questions were answered. The patient knows to call the clinic with any problems, questions or concerns. No barriers to learning was detected. I spent 20 minutes counseling the patient face to face. The total time spent in the appointment was 25 minutes and more than 50% was on counseling and review of test results     Heath Lark, MD 04/08/2016 9:22 AM

## 2016-04-08 NOTE — Assessment & Plan Note (Signed)
He has unintentional weight loss due to recent poor appetite and altered taste sensation. He is using his feeding tube for nutrition and I encouraged him to do so

## 2016-04-08 NOTE — Assessment & Plan Note (Signed)
This is related to recent side effects of chemotherapy. I will plan on dose adjustment for cycle 2 of treatment

## 2016-04-08 NOTE — Telephone Encounter (Signed)
Per LOS I have scheduled appts and notified the scheduler 

## 2016-04-08 NOTE — Telephone Encounter (Signed)
Message sent to infusion scheduler for additional IVF to be added, per 04/08/16 los. Appointments scheduled per 04/08/16 los. A copy of the AVS report and appointment schedule was given to patient, per 04/08/16 los.

## 2016-04-08 NOTE — Assessment & Plan Note (Signed)
The patient experienced profound nausea without multiple vomiting last week. He is doing better now and able to hydrate himself. Nausea have resolved. He is a complaint of bilateral tinnitus. I plan to reduce the dose of chemotherapy in the future by 20%

## 2016-04-08 NOTE — Assessment & Plan Note (Signed)
His blood pressure is now low normal. I recommend he stops amlodipine.

## 2016-04-08 NOTE — Progress Notes (Signed)
Introduced myself as his FA.  Informed him unfortunately there aren't any foundations offering copay assistance for his Dx but offered the [place], went over what it covers and gave him an expense sheet.  Pt would like to apply so he will provide his proof of income.

## 2016-04-08 NOTE — Assessment & Plan Note (Signed)
He has minor jaw pain after radiation treatment. I recommend over-the-counter analgesics as needed

## 2016-04-09 ENCOUNTER — Encounter: Payer: Self-pay | Admitting: Hematology and Oncology

## 2016-04-09 ENCOUNTER — Ambulatory Visit
Admission: RE | Admit: 2016-04-09 | Discharge: 2016-04-09 | Disposition: A | Discharge status: Home / Self Care | Payer: BLUE CROSS/BLUE SHIELD | Source: Ambulatory Visit | Attending: Radiation Oncology | Admitting: Radiation Oncology

## 2016-04-09 DIAGNOSIS — Z7982 Long term (current) use of aspirin: Secondary | ICD-10-CM | POA: Diagnosis not present

## 2016-04-09 DIAGNOSIS — Z87891 Personal history of nicotine dependence: Secondary | ICD-10-CM | POA: Diagnosis not present

## 2016-04-09 DIAGNOSIS — E039 Hypothyroidism, unspecified: Secondary | ICD-10-CM | POA: Diagnosis not present

## 2016-04-09 DIAGNOSIS — R1310 Dysphagia, unspecified: Secondary | ICD-10-CM | POA: Diagnosis not present

## 2016-04-09 DIAGNOSIS — E785 Hyperlipidemia, unspecified: Secondary | ICD-10-CM | POA: Diagnosis not present

## 2016-04-09 DIAGNOSIS — C090 Malignant neoplasm of tonsillar fossa: Secondary | ICD-10-CM | POA: Diagnosis not present

## 2016-04-09 DIAGNOSIS — I10 Essential (primary) hypertension: Secondary | ICD-10-CM | POA: Diagnosis not present

## 2016-04-09 DIAGNOSIS — Z79899 Other long term (current) drug therapy: Secondary | ICD-10-CM | POA: Diagnosis not present

## 2016-04-09 DIAGNOSIS — M1990 Unspecified osteoarthritis, unspecified site: Secondary | ICD-10-CM | POA: Diagnosis not present

## 2016-04-09 DIAGNOSIS — Z510 Encounter for antineoplastic radiation therapy: Secondary | ICD-10-CM | POA: Diagnosis not present

## 2016-04-09 DIAGNOSIS — Z809 Family history of malignant neoplasm, unspecified: Secondary | ICD-10-CM | POA: Diagnosis not present

## 2016-04-09 NOTE — Progress Notes (Signed)
Pt approved for the $400 [place].

## 2016-04-10 ENCOUNTER — Ambulatory Visit
Admission: RE | Admit: 2016-04-10 | Discharge: 2016-04-10 | Disposition: A | Discharge status: Home / Self Care | Payer: BLUE CROSS/BLUE SHIELD | Source: Ambulatory Visit | Attending: Radiation Oncology | Admitting: Radiation Oncology

## 2016-04-10 DIAGNOSIS — I10 Essential (primary) hypertension: Secondary | ICD-10-CM | POA: Diagnosis not present

## 2016-04-10 DIAGNOSIS — Z809 Family history of malignant neoplasm, unspecified: Secondary | ICD-10-CM | POA: Diagnosis not present

## 2016-04-10 DIAGNOSIS — E039 Hypothyroidism, unspecified: Secondary | ICD-10-CM | POA: Diagnosis not present

## 2016-04-10 DIAGNOSIS — M1990 Unspecified osteoarthritis, unspecified site: Secondary | ICD-10-CM | POA: Diagnosis not present

## 2016-04-10 DIAGNOSIS — Z79899 Other long term (current) drug therapy: Secondary | ICD-10-CM | POA: Diagnosis not present

## 2016-04-10 DIAGNOSIS — C090 Malignant neoplasm of tonsillar fossa: Secondary | ICD-10-CM | POA: Diagnosis not present

## 2016-04-10 DIAGNOSIS — E785 Hyperlipidemia, unspecified: Secondary | ICD-10-CM | POA: Diagnosis not present

## 2016-04-10 DIAGNOSIS — Z87891 Personal history of nicotine dependence: Secondary | ICD-10-CM | POA: Diagnosis not present

## 2016-04-10 DIAGNOSIS — Z510 Encounter for antineoplastic radiation therapy: Secondary | ICD-10-CM | POA: Diagnosis not present

## 2016-04-10 DIAGNOSIS — Z7982 Long term (current) use of aspirin: Secondary | ICD-10-CM | POA: Diagnosis not present

## 2016-04-10 DIAGNOSIS — R1310 Dysphagia, unspecified: Secondary | ICD-10-CM | POA: Diagnosis not present

## 2016-04-11 ENCOUNTER — Other Ambulatory Visit (HOSPITAL_BASED_OUTPATIENT_CLINIC_OR_DEPARTMENT_OTHER): Payer: BLUE CROSS/BLUE SHIELD

## 2016-04-11 ENCOUNTER — Ambulatory Visit
Admission: RE | Admit: 2016-04-11 | Discharge: 2016-04-11 | Disposition: A | Discharge status: Home / Self Care | Payer: BLUE CROSS/BLUE SHIELD | Source: Ambulatory Visit | Attending: Radiation Oncology | Admitting: Radiation Oncology

## 2016-04-11 DIAGNOSIS — Z809 Family history of malignant neoplasm, unspecified: Secondary | ICD-10-CM | POA: Diagnosis not present

## 2016-04-11 DIAGNOSIS — Z510 Encounter for antineoplastic radiation therapy: Secondary | ICD-10-CM | POA: Diagnosis not present

## 2016-04-11 DIAGNOSIS — Z87891 Personal history of nicotine dependence: Secondary | ICD-10-CM | POA: Diagnosis not present

## 2016-04-11 DIAGNOSIS — C090 Malignant neoplasm of tonsillar fossa: Secondary | ICD-10-CM | POA: Diagnosis not present

## 2016-04-11 DIAGNOSIS — C099 Malignant neoplasm of tonsil, unspecified: Secondary | ICD-10-CM | POA: Diagnosis not present

## 2016-04-11 DIAGNOSIS — E039 Hypothyroidism, unspecified: Secondary | ICD-10-CM | POA: Diagnosis not present

## 2016-04-11 DIAGNOSIS — Z79899 Other long term (current) drug therapy: Secondary | ICD-10-CM | POA: Diagnosis not present

## 2016-04-11 DIAGNOSIS — E785 Hyperlipidemia, unspecified: Secondary | ICD-10-CM | POA: Diagnosis not present

## 2016-04-11 DIAGNOSIS — I10 Essential (primary) hypertension: Secondary | ICD-10-CM | POA: Diagnosis not present

## 2016-04-11 DIAGNOSIS — R1310 Dysphagia, unspecified: Secondary | ICD-10-CM | POA: Diagnosis not present

## 2016-04-11 DIAGNOSIS — M1990 Unspecified osteoarthritis, unspecified site: Secondary | ICD-10-CM | POA: Diagnosis not present

## 2016-04-11 DIAGNOSIS — Z7982 Long term (current) use of aspirin: Secondary | ICD-10-CM | POA: Diagnosis not present

## 2016-04-11 LAB — COMPREHENSIVE METABOLIC PANEL
ALT: 24 U/L (ref 0–55)
ANION GAP: 8 meq/L (ref 3–11)
AST: 17 U/L (ref 5–34)
Albumin: 3.5 g/dL (ref 3.5–5.0)
Alkaline Phosphatase: 49 U/L (ref 40–150)
BILIRUBIN TOTAL: 0.38 mg/dL (ref 0.20–1.20)
BUN: 14.7 mg/dL (ref 7.0–26.0)
CHLORIDE: 102 meq/L (ref 98–109)
CO2: 27 meq/L (ref 22–29)
Calcium: 10 mg/dL (ref 8.4–10.4)
Creatinine: 1 mg/dL (ref 0.7–1.3)
EGFR: 87 mL/min/{1.73_m2} — AB (ref >90)
GLUCOSE: 89 mg/dL (ref 70–140)
POTASSIUM: 4.3 meq/L (ref 3.5–5.1)
SODIUM: 137 meq/L (ref 136–145)
TOTAL PROTEIN: 6.9 g/dL (ref 6.4–8.3)

## 2016-04-11 LAB — MAGNESIUM: MAGNESIUM: 2 mg/dL (ref 1.5–2.5)

## 2016-04-11 LAB — CBC WITH DIFFERENTIAL/PLATELET
BASO%: 1 % (ref 0.0–2.0)
BASOS ABS: 0 10*3/uL (ref 0.0–0.1)
EOS ABS: 0 10*3/uL (ref 0.0–0.5)
EOS%: 1.7 % (ref 0.0–7.0)
HEMATOCRIT: 34.3 % — AB (ref 38.4–49.9)
HEMOGLOBIN: 11.6 g/dL — AB (ref 13.0–17.1)
LYMPH#: 0.4 10*3/uL — AB (ref 0.9–3.3)
LYMPH%: 19.2 % (ref 14.0–49.0)
MCH: 29.3 pg (ref 27.2–33.4)
MCHC: 33.8 g/dL (ref 32.0–36.0)
MCV: 86.6 fL (ref 79.3–98.0)
MONO#: 0.4 10*3/uL (ref 0.1–0.9)
MONO%: 17.1 % — ABNORMAL HIGH (ref 0.0–14.0)
NEUT#: 1.3 10*3/uL — ABNORMAL LOW (ref 1.5–6.5)
NEUT%: 61 % (ref 39.0–75.0)
PLATELETS: 389 10*3/uL (ref 140–400)
RBC: 3.96 10*6/uL — ABNORMAL LOW (ref 4.20–5.82)
RDW: 12.8 % (ref 11.0–14.6)
WBC: 2.2 10*3/uL — ABNORMAL LOW (ref 4.0–10.3)

## 2016-04-14 ENCOUNTER — Encounter: Payer: Self-pay | Admitting: Radiation Oncology

## 2016-04-14 ENCOUNTER — Ambulatory Visit
Admission: RE | Admit: 2016-04-14 | Discharge: 2016-04-14 | Disposition: A | Discharge status: Home / Self Care | Payer: BLUE CROSS/BLUE SHIELD | Source: Ambulatory Visit | Attending: Radiation Oncology | Admitting: Radiation Oncology

## 2016-04-14 ENCOUNTER — Ambulatory Visit: Payer: BLUE CROSS/BLUE SHIELD | Admitting: Nutrition

## 2016-04-14 ENCOUNTER — Ambulatory Visit (HOSPITAL_BASED_OUTPATIENT_CLINIC_OR_DEPARTMENT_OTHER): Payer: BLUE CROSS/BLUE SHIELD

## 2016-04-14 ENCOUNTER — Encounter: Payer: Self-pay | Admitting: Hematology and Oncology

## 2016-04-14 ENCOUNTER — Ambulatory Visit (HOSPITAL_BASED_OUTPATIENT_CLINIC_OR_DEPARTMENT_OTHER): Payer: BLUE CROSS/BLUE SHIELD | Admitting: Hematology and Oncology

## 2016-04-14 VITALS — BP 117/73 | HR 72 | Temp 98.4°F | Ht 70.0 in | Wt 174.8 lb

## 2016-04-14 DIAGNOSIS — D6481 Anemia due to antineoplastic chemotherapy: Secondary | ICD-10-CM

## 2016-04-14 DIAGNOSIS — C099 Malignant neoplasm of tonsil, unspecified: Secondary | ICD-10-CM

## 2016-04-14 DIAGNOSIS — D702 Other drug-induced agranulocytosis: Secondary | ICD-10-CM

## 2016-04-14 DIAGNOSIS — Z5111 Encounter for antineoplastic chemotherapy: Secondary | ICD-10-CM | POA: Diagnosis not present

## 2016-04-14 DIAGNOSIS — R1310 Dysphagia, unspecified: Secondary | ICD-10-CM | POA: Diagnosis not present

## 2016-04-14 DIAGNOSIS — Z510 Encounter for antineoplastic radiation therapy: Secondary | ICD-10-CM | POA: Diagnosis not present

## 2016-04-14 DIAGNOSIS — Z79899 Other long term (current) drug therapy: Secondary | ICD-10-CM | POA: Diagnosis not present

## 2016-04-14 DIAGNOSIS — H9313 Tinnitus, bilateral: Secondary | ICD-10-CM

## 2016-04-14 DIAGNOSIS — Z87891 Personal history of nicotine dependence: Secondary | ICD-10-CM | POA: Diagnosis not present

## 2016-04-14 DIAGNOSIS — E785 Hyperlipidemia, unspecified: Secondary | ICD-10-CM | POA: Diagnosis not present

## 2016-04-14 DIAGNOSIS — Z7982 Long term (current) use of aspirin: Secondary | ICD-10-CM | POA: Diagnosis not present

## 2016-04-14 DIAGNOSIS — M1990 Unspecified osteoarthritis, unspecified site: Secondary | ICD-10-CM | POA: Diagnosis not present

## 2016-04-14 DIAGNOSIS — C090 Malignant neoplasm of tonsillar fossa: Secondary | ICD-10-CM

## 2016-04-14 DIAGNOSIS — I10 Essential (primary) hypertension: Secondary | ICD-10-CM | POA: Diagnosis not present

## 2016-04-14 DIAGNOSIS — R110 Nausea: Secondary | ICD-10-CM

## 2016-04-14 DIAGNOSIS — Z809 Family history of malignant neoplasm, unspecified: Secondary | ICD-10-CM | POA: Diagnosis not present

## 2016-04-14 DIAGNOSIS — E039 Hypothyroidism, unspecified: Secondary | ICD-10-CM | POA: Diagnosis not present

## 2016-04-14 DIAGNOSIS — T451X5A Adverse effect of antineoplastic and immunosuppressive drugs, initial encounter: Secondary | ICD-10-CM

## 2016-04-14 DIAGNOSIS — R634 Abnormal weight loss: Secondary | ICD-10-CM

## 2016-04-14 MED ORDER — SODIUM CHLORIDE 0.9 % IV SOLN
Freq: Once | INTRAVENOUS | Status: AC
  Administered 2016-04-14: 10:00:00 via INTRAVENOUS

## 2016-04-14 MED ORDER — PALONOSETRON HCL INJECTION 0.25 MG/5ML
INTRAVENOUS | Status: AC
  Filled 2016-04-14: qty 5

## 2016-04-14 MED ORDER — HEPARIN SOD (PORK) LOCK FLUSH 100 UNIT/ML IV SOLN
500.0000 [IU] | Freq: Once | INTRAVENOUS | Status: AC | PRN
  Administered 2016-04-14: 500 [IU]
  Filled 2016-04-14: qty 5

## 2016-04-14 MED ORDER — SODIUM CHLORIDE 0.9 % IV SOLN
80.0000 mg/m2 | Freq: Once | INTRAVENOUS | Status: AC
  Administered 2016-04-14: 167 mg via INTRAVENOUS
  Filled 2016-04-14: qty 167

## 2016-04-14 MED ORDER — SODIUM CHLORIDE 0.9% FLUSH
10.0000 mL | INTRAVENOUS | Status: DC | PRN
Start: 2016-04-14 — End: 2016-04-14
  Administered 2016-04-14: 10 mL
  Filled 2016-04-14: qty 10

## 2016-04-14 MED ORDER — SODIUM CHLORIDE 0.9 % IV SOLN
Freq: Once | INTRAVENOUS | Status: AC
  Administered 2016-04-14: 14:00:00 via INTRAVENOUS
  Filled 2016-04-14: qty 5

## 2016-04-14 MED ORDER — POTASSIUM CHLORIDE 2 MEQ/ML IV SOLN
Freq: Once | INTRAVENOUS | Status: AC
  Administered 2016-04-14: 10:00:00 via INTRAVENOUS
  Filled 2016-04-14: qty 10

## 2016-04-14 MED ORDER — PALONOSETRON HCL INJECTION 0.25 MG/5ML
0.2500 mg | Freq: Once | INTRAVENOUS | Status: AC
  Administered 2016-04-14: 0.25 mg via INTRAVENOUS

## 2016-04-14 NOTE — Assessment & Plan Note (Signed)
This is likely due to recent treatment. The patient denies recent history of bleeding such as epistaxis, hematuria or hematochezia. He is asymptomatic from the anemia. I will observe for now.  He does not require transfusion now. I will continue the chemotherapy at current dose without dosage adjustment.  If the anemia gets progressive worse in the future, I might have to delay his treatment or adjust the chemotherapy dose.  

## 2016-04-14 NOTE — Progress Notes (Signed)
   Weekly Management Note:  Outpatient    ICD-9-CM ICD-10-CM   1. Carcinoma of tonsillar fossa (HCC) 146.1 C09.0     Current Dose: 28 Gy  Projected Dose: 70 Gy   Narrative:  The patient presents for routine under treatment assessment.  CBCT/MVCT images/Port film x-rays were reviewed.  The chart was checked.  Regaining lost weight since nausea, he reports. All food by mouth.  Chemotherapy cycle two today.   Physical Findings:  Wt Readings from Last 3 Encounters:  04/14/16 175 lb 8 oz (79.6 kg)  04/14/16 174 lb 12.8 oz (79.3 kg)  04/08/16 177 lb 8 oz (80.5 kg)    height is 5\' 10"  (1.778 m) and weight is 174 lb 12.8 oz (79.3 kg). His temperature is 98.4 F (36.9 C). His blood pressure is 117/73 and his pulse is 72. His oxygen saturation is 99%.  ulcerative left tonsil tumor has improved significantly over the week.  Skin intact over neck, slightly pink.  Left upper neck mass, palpable.  CBC    Component Value Date/Time   WBC 2.2 (L) 04/11/2016 0845   WBC 6.2 03/20/2016 1200   RBC 3.96 (L) 04/11/2016 0845   RBC 4.16 (L) 03/20/2016 1200   HGB 11.6 (L) 04/11/2016 0845   HCT 34.3 (L) 04/11/2016 0845   PLT 389 04/11/2016 0845   MCV 86.6 04/11/2016 0845   MCH 29.3 04/11/2016 0845   MCH 30.0 03/20/2016 1200   MCHC 33.8 04/11/2016 0845   MCHC 35.5 03/20/2016 1200   RDW 12.8 04/11/2016 0845   LYMPHSABS 0.4 (L) 04/11/2016 0845   MONOABS 0.4 04/11/2016 0845   EOSABS 0.0 04/11/2016 0845   BASOSABS 0.0 04/11/2016 0845     CMP     Component Value Date/Time   NA 137 04/11/2016 0845   K 4.3 04/11/2016 0845   CL 105 03/20/2016 1200   CO2 27 04/11/2016 0845   GLUCOSE 89 04/11/2016 0845   BUN 14.7 04/11/2016 0845   CREATININE 1.0 04/11/2016 0845   CALCIUM 10.0 04/11/2016 0845   PROT 6.9 04/11/2016 0845   ALBUMIN 3.5 04/11/2016 0845   AST 17 04/11/2016 0845   ALT 24 04/11/2016 0845   ALKPHOS 49 04/11/2016 0845   BILITOT 0.38 04/11/2016 0845   GFRNONAA >60 03/20/2016 1200   GFRAA >60 03/20/2016 1200     Impression:  The patient is tolerating radiotherapy.   Plan:  Continue radiotherapy as planned.   Use sucralfate and lidocaine prn for esophagitis.  -----------------------------------  Eppie Gibson, MD

## 2016-04-14 NOTE — Progress Notes (Signed)
[place] OFFICE PROGRESS NOTE  Patient Care Team: Haywood Pao, MD as PCP - General (Internal Medicine) Leota Sauers, RN as Oncology Nurse Navigator Eppie Gibson, MD as Attending Physician (Radiation Oncology) Heath Lark, MD as Consulting Physician (Hematology and Oncology) Karie Mainland, RD as Dietitian (Nutrition)  SUMMARY OF ONCOLOGIC HISTORY:   Tonsil cancer ([place])   02/07/2016 Imaging    Ct neck showed abnormal soft tissue within the left pharynx, extending from the left tonsillar pillar inferiorly to the level of the hyoid and filling the left vallecula. The appearance is most consistent with a neoplastic process. 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 Histologic sampling and direct visualization should be considered. Confluent adenopathy within the left cervical chain with areas of central hypoattenuation suggesting degrees of necrosis. This is most suggestive of metastatic spread. The areas of necrosis within the adenopathy combined with the above-described pharyngeal soft tissue mass are together suggestive of squamous cell carcinoma.      02/14/2016 Procedure    He has FNA of left neck LN       02/14/2016 Pathology Results    [id number] cytology confirmed squamous cell carcinoma      02/26/2016 PET scan    Hypermetabolic left tonsillar in pharyngeal mass with hypermetabolic left sided station IA, station III, and station Ib adenopathy and hypermetabolic station IIa adenopathy on the right. Borderline hypermetabolic small right station IIb lymph node. Several tiny lung nodules are observed, these are not hypermetabolic but are also below the threshold level for accurate characterization. Dedicated CT chest may be warranted to act as a baseline.      03/20/2016 Procedure    Placement of a right internal jugular approach power injectable Port-A-Cath. Fluoroscopic insertion of a 20-French pull-through gastrostomy tube.        03/24/2016 -  Chemotherapy    He received high dose cisplatin      03/24/2016 -  Radiation Therapy    He received concurrent radiation       INTERVAL HISTORY: Please see below for problem oriented charting. He is seen today prior to cycle 2 of treatment. He has lost some weight but overall feeling good. He is able to tolerate 100% of the food by mouth. Denies nausea or vomiting. Tinnitus is improving. Denies peripheral neuropathy.   REVIEW OF SYSTEMS:   Constitutional: Denies fevers, chills or abnormal weight loss Eyes: Denies blurriness of vision Ears, nose, mouth, throat, and face: Denies mucositis or sore throat Respiratory: Denies cough, dyspnea or wheezes Cardiovascular: Denies palpitation, chest discomfort or lower extremity swelling Gastrointestinal:  Denies nausea, heartburn or change in bowel habits Skin: Denies abnormal skin rashes Lymphatics: Denies new lymphadenopathy or easy bruising Neurological:Denies numbness, tingling or new weaknesses Behavioral/Psych: Mood is stable, no new changes  All other systems were reviewed with the patient and are negative.  I have reviewed the past medical history, past surgical history, social history and family history with the patient and they are unchanged from previous note.  ALLERGIES:  has No Known Allergies.  MEDICATIONS:  Current Outpatient Prescriptions  Medication Sig Dispense Refill  . acetaminophen (TYLENOL) 650 MG CR tablet Take 1,300 mg by mouth.    [name] amLODipine (NORVASC) 10 MG tablet Take 1 tablet (10 mg total) by mouth daily. (Patient not taking: Reported on 04/14/2016) 30 tablet 11  . aspirin EC 81 MG tablet Take by mouth.    . fenofibrate (TRICOR) 145 MG tablet     . fentaNYL (  DURAGESIC - DOSED MCG/HR) 25 MCG/HR patch Place 1 patch (25 mcg total) onto the skin every 3 (three) days. (Patient not taking: Reported on 04/14/2016) 5 patch 0  . GLUCOSAMINE SULFATE PO Take by mouth.    [name] HYDROcodone-acetaminophen  (NORCO) 10-325 MG tablet TAKE 1 TABLET BY MOUTH EVERY 4-6 HOURS AS NEEDED FOR PAIN  0  . levothyroxine (SYNTHROID, LEVOTHROID) 50 MCG tablet     . lidocaine (XYLOCAINE) 2 % solution Patient: Mix 1part 2% viscous lidocaine, 1part H20. Swish/swallow 33mL of this mixture, 19min before meals and at bedtime, up to QID (Patient not taking: Reported on 04/14/2016) 200 mL 3  . lidocaine-prilocaine (EMLA) cream Apply to affected area once 30 g 3  . morphine (ROXANOL) 20 MG/ML concentrated solution Take 0.5 mLs (10 mg total) by mouth every 2 (two) hours as needed for severe pain. (Patient not taking: Reported on 04/14/2016) 120 mL 0  . Multiple Vitamin (MULTIVITAMIN) capsule Take by mouth.    . ondansetron (ZOFRAN) 8 MG tablet Take 1 tablet (8 mg total) by mouth 2 (two) times daily as needed. Start on the third day after chemotherapy. 30 tablet 1  . prochlorperazine (COMPAZINE) 10 MG tablet Take 1 tablet (10 mg total) by mouth every 6 (six) hours as needed (Nausea or vomiting). 30 tablet 1  . sodium fluoride (FLUORISHIELD) 1.1 % GEL dental gel Instill one drop of gel per tooth space of fluoride tray. Place over teeth for 5 minutes. Remove. Spit out excess. Repeat nightly. 120 mL prn  . sucralfate (CARAFATE) 1 g tablet Dissolve 1 tablet in 10 mL H20 and swallow QID prn sore throat. (Patient not taking: Reported on 04/14/2016) 60 tablet 3   No current facility-administered medications for this visit.     PHYSICAL EXAMINATION: ECOG PERFORMANCE STATUS: 1 - Symptomatic but completely ambulatory  Vitals:   04/14/16 0856  BP: 116/72  Pulse: 64  Resp: 18  Temp: 97.5 F (36.4 C)   Filed Weights   04/14/16 0856  Weight: 175 lb 8 oz (79.6 kg)    GENERAL:alert, no distress and comfortable SKIN: skin color, texture, turgor are normal, no rashes or significant lesions EYES: normal, Conjunctiva are pink and non-injected, sclera clear OROPHARYNX:no exudate, no erythema and lips, buccal mucosa, and tongue normal   NECK: supple, thyroid normal size, non-tender, without nodularity LYMPH:  no palpable lymphadenopathy in the cervical, axillary or inguinal LUNGS: clear to auscultation and percussion with normal breathing effort HEART: regular rate & rhythm and no murmurs and no lower extremity edema ABDOMEN:abdomen soft, non-tender and normal bowel sounds Musculoskeletal:no cyanosis of digits and no clubbing  NEURO: alert & oriented x 3 with fluent speech, no focal motor/sensory deficits  LABORATORY DATA:  I have reviewed the data as listed    Component Value Date/Time   NA 137 04/11/2016 0845   K 4.3 04/11/2016 0845   CL 105 03/20/2016 1200   CO2 27 04/11/2016 0845   GLUCOSE 89 04/11/2016 0845   BUN 14.7 04/11/2016 0845   CREATININE 1.0 04/11/2016 0845   CALCIUM 10.0 04/11/2016 0845   PROT 6.9 04/11/2016 0845   ALBUMIN 3.5 04/11/2016 0845   AST 17 04/11/2016 0845   ALT 24 04/11/2016 0845   ALKPHOS 49 04/11/2016 0845   BILITOT 0.38 04/11/2016 0845   GFRNONAA >60 03/20/2016 1200   GFRAA >60 03/20/2016 1200    No results found for: SPEP, UPEP  Lab Results  Component Value Date   WBC 2.2 (L) 04/11/2016  NEUTROABS 1.3 (L) 04/11/2016   HGB 11.6 (L) 04/11/2016   HCT 34.3 (L) 04/11/2016   MCV 86.6 04/11/2016   PLT 389 04/11/2016      Chemistry      Component Value Date/Time   NA 137 04/11/2016 0845   K 4.3 04/11/2016 0845   CL 105 03/20/2016 1200   CO2 27 04/11/2016 0845   BUN 14.7 04/11/2016 0845   CREATININE 1.0 04/11/2016 0845      Component Value Date/Time   CALCIUM 10.0 04/11/2016 0845   ALKPHOS 49 04/11/2016 0845   AST 17 04/11/2016 0845   ALT 24 04/11/2016 0845   BILITOT 0.38 04/11/2016 0845      ASSESSMENT & PLAN:  Tonsil cancer ([place]) He is doing well. I plan to reduce the dose of chemotherapy in the future by 20% due to tinnitus He is mildly neutropenic but asymptomatic We will proceed with rx today   Drug-induced neutropenia ([place]) This is likely due to  recent treatment. The patient denies recent history of fevers, cough, chills, diarrhea or dysuria. He is asymptomatic from the leukopenia. I will observe for now.  We will proceed with treatment today with dose adjustment as planned  Bilateral tinnitus This is stable and improving. Reduced dose as above  Chemotherapy-induced nausea He had history of chemotherapy-induced nausea and vomiting. I have recommend daily IV fluids this week and additional IV dexamethasone for 2 days after chemotherapy  Weight loss He has unintentional weight loss due to recent poor appetite and altered taste sensation. He is able to tolerate 100% of by mouth.  Anemia due to antineoplastic chemotherapy This is likely due to recent treatment. The patient denies recent history of bleeding such as epistaxis, hematuria or hematochezia. He is asymptomatic from the anemia. I will observe for now.  He does not require transfusion now. I will continue the chemotherapy at current dose without dosage adjustment.  If the anemia gets progressive worse in the future, I might have to delay his treatment or adjust the chemotherapy dose.    No orders of the defined types were placed in this encounter.  All questions were answered. The patient knows to call the clinic with any problems, questions or concerns. No barriers to learning was detected. I spent 25 minutes counseling the patient face to face. The total time spent in the appointment was 40 minutes and more than 50% was on counseling and review of test results     Heath Lark, MD 04/14/2016 9:28 AM

## 2016-04-14 NOTE — Progress Notes (Signed)
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 Weight has decreased and documented as 174.8 pounds, down from 187.6 pounds November 13. Patient reports he has not used his feeding tube. He is eating 100% of his meals and snacks by mouth. His nausea is controlled at his heartburn has improved. He is trying to drink one oral nutrition supplement daily. Poor appetite and taste alterations continue. He is drinking 4 bottles of water daily.  Nutrition diagnosis: Inadequate oral intake continues.  Intervention: Educated patient on strategies for increasing calories and protein. I reviewed the importance of weight maintenance. Recommended patient increase oral nutrition supplements 3 times a day. Encouraged patient to use feeding tube if he is unable to eat or drink and has continued weight loss. Teach back method used.  Monitoring, evaluation, goals:  Patient will tolerate adequate calories and protein to minimize further weight loss.  Next visit: Monday, December 11during infusion.  **Disclaimer: This note was dictated with voice recognition software. Similar sounding words can inadvertently be transcribed and this note may contain transcription errors which may not have been corrected upon publication of note.**

## 2016-04-14 NOTE — Patient Instructions (Addendum)
[facility] Discharge Instructions for Patients Receiving Chemotherapy  Today you received the following chemotherapy agents: Cisplatin.  To help prevent nausea and vomiting after your treatment, we encourage you to take your nausea medication as prescribed. Make take Compazine starting today, and resume Zofran in 3 days.   If you develop nausea and vomiting that is not controlled by your nausea medication, call the clinic.   BELOW ARE SYMPTOMS THAT SHOULD BE REPORTED IMMEDIATELY:  *FEVER GREATER THAN 100.5 F  *CHILLS WITH OR WITHOUT FEVER  NAUSEA AND VOMITING THAT IS NOT CONTROLLED WITH YOUR NAUSEA MEDICATION  *UNUSUAL SHORTNESS OF BREATH  *UNUSUAL BRUISING OR BLEEDING  TENDERNESS IN MOUTH AND THROAT WITH OR WITHOUT PRESENCE OF ULCERS  *URINARY PROBLEMS  *BOWEL PROBLEMS  UNUSUAL RASH Items with * indicate a potential emergency and should be followed up as soon as possible.  Feel free to call the clinic you have any questions or concerns. The clinic phone number is [phone].  Please show the [place] at check-in to the Emergency Department and triage nurse.   Cisplatin injection What is this medicine? CISPLATIN (SIS pla tin) is a chemotherapy drug. It targets fast dividing cells, like cancer cells, and causes these cells to die. This medicine is used to treat many types of cancer like bladder, ovarian, and testicular cancers. This medicine may be used for other purposes; ask your health care provider or pharmacist if you have questions. What should I tell my health care provider before I take this medicine? They need to know if you have any of these conditions: -blood disorders -hearing problems -kidney disease -recent or ongoing radiation therapy -an unusual or allergic reaction to cisplatin, carboplatin, other chemotherapy, other medicines, foods, dyes, or preservatives -pregnant or trying to get pregnant -breast-feeding How should I use  this medicine? This drug is given as an infusion into a vein. It is administered in a hospital or clinic by a specially trained health care professional. Talk to your pediatrician regarding the use of this medicine in children. Special care may be needed. Overdosage: If you think you have taken too much of this medicine contact a poison control center or emergency room at once. NOTE: This medicine is only for you. Do not share this medicine with others. What if I miss a dose? It is important not to miss a dose. Call your doctor or health care professional if you are unable to keep an appointment. What may interact with this medicine? -dofetilide -foscarnet -medicines for seizures -medicines to increase blood counts like filgrastim, pegfilgrastim, sargramostim -probenecid -pyridoxine used with altretamine -rituximab -some antibiotics like amikacin, gentamicin, neomycin, polymyxin B, streptomycin, tobramycin -sulfinpyrazone -vaccines -zalcitabine Talk to your doctor or health care professional before taking any of these medicines: -acetaminophen -aspirin -ibuprofen -ketoprofen -naproxen This list may not describe all possible interactions. Give your health care provider a list of all the medicines, herbs, non-prescription drugs, or dietary supplements you use. Also tell them if you smoke, drink alcohol, or use illegal drugs. Some items may interact with your medicine. What should I watch for while using this medicine? Your condition will be monitored carefully while you are receiving this medicine. You will need important blood work done while you are taking this medicine. This drug may make you feel generally unwell. This is not uncommon, as chemotherapy can affect healthy cells as well as cancer cells. Report any side effects. Continue your course of treatment even though you feel ill unless your  doctor tells you to stop. In some cases, you may be given additional medicines to help with  side effects. Follow all directions for their use. Call your doctor or health care professional for advice if you get a fever, chills or sore throat, or other symptoms of a cold or flu. Do not treat yourself. This drug decreases your body's ability to fight infections. Try to avoid being around people who are sick. This medicine may increase your risk to bruise or bleed. Call your doctor or health care professional if you notice any unusual bleeding. Be careful brushing and flossing your teeth or using a toothpick because you may get an infection or bleed more easily. If you have any dental work done, tell your dentist you are receiving this medicine. Avoid taking products that contain aspirin, acetaminophen, ibuprofen, naproxen, or ketoprofen unless instructed by your doctor. These medicines may hide a fever. Do not become pregnant while taking this medicine. Women should inform their doctor if they wish to become pregnant or think they might be pregnant. There is a potential for serious side effects to an unborn child. Talk to your health care professional or pharmacist for more information. Do not breast-feed an infant while taking this medicine. Drink fluids as directed while you are taking this medicine. This will help protect your kidneys. Call your doctor or health care professional if you get diarrhea. Do not treat yourself. What side effects may I notice from receiving this medicine? Side effects that you should report to your doctor or health care professional as soon as possible: -allergic reactions like skin rash, itching or hives, swelling of the face, lips, or tongue -signs of infection - fever or chills, cough, sore throat, pain or difficulty passing urine -signs of decreased platelets or bleeding - bruising, pinpoint red spots on the skin, black, tarry stools, nosebleeds -signs of decreased red blood cells - unusually weak or tired, fainting spells, lightheadedness -breathing  problems -changes in hearing -gout pain -low blood counts - This drug may decrease the number of white blood cells, red blood cells and platelets. You may be at increased risk for infections and bleeding. -nausea and vomiting -pain, swelling, redness or irritation at the injection site -pain, tingling, numbness in the hands or feet -problems with balance, movement -trouble passing urine or change in the amount of urine Side effects that usually do not require medical attention (report to your doctor or health care professional if they continue or are bothersome): -changes in vision -loss of appetite -metallic taste in the mouth or changes in taste This list may not describe all possible side effects. Call your doctor for medical advice about side effects. You may report side effects to FDA at 1-800-FDA-1088. Where should I keep my medicine? This drug is given in a hospital or clinic and will not be stored at home. NOTE: This sheet is a summary. It may not cover all possible information. If you have questions about this medicine, talk to your doctor, pharmacist, or health care provider.    2016, [employer]. (2007-08-03 14:40:54)     [facility] Discharge Instructions for Patients Receiving Chemotherapy  Today you received the following chemotherapy agents: Cisplatin.  To help prevent nausea and vomiting after your treatment, we encourage you to take your nausea medication as prescribed. Make take Compazine starting today, and resume Zofran in 3 days.   If you develop nausea and vomiting that is not controlled by your nausea medication, call the clinic.  BELOW ARE SYMPTOMS THAT SHOULD BE REPORTED IMMEDIATELY:  *FEVER GREATER THAN 100.5 F  *CHILLS WITH OR WITHOUT FEVER  NAUSEA AND VOMITING THAT IS NOT CONTROLLED WITH YOUR NAUSEA MEDICATION  *UNUSUAL SHORTNESS OF BREATH  *UNUSUAL BRUISING OR BLEEDING  TENDERNESS IN MOUTH AND THROAT WITH OR WITHOUT  PRESENCE OF ULCERS  *URINARY PROBLEMS  *BOWEL PROBLEMS  UNUSUAL RASH Items with * indicate a potential emergency and should be followed up as soon as possible.  Feel free to call the clinic you have any questions or concerns. The clinic phone number is [phone].  Please show the [place] at check-in to the Emergency Department and triage nurse.   Cisplatin injection What is this medicine? CISPLATIN (SIS pla tin) is a chemotherapy drug. It targets fast dividing cells, like cancer cells, and causes these cells to die. This medicine is used to treat many types of cancer like bladder, ovarian, and testicular cancers. This medicine may be used for other purposes; ask your health care provider or pharmacist if you have questions. What should I tell my health care provider before I take this medicine? They need to know if you have any of these conditions: -blood disorders -hearing problems -kidney disease -recent or ongoing radiation therapy -an unusual or allergic reaction to cisplatin, carboplatin, other chemotherapy, other medicines, foods, dyes, or preservatives -pregnant or trying to get pregnant -breast-feeding How should I use this medicine? This drug is given as an infusion into a vein. It is administered in a hospital or clinic by a specially trained health care professional. Talk to your pediatrician regarding the use of this medicine in children. Special care may be needed. Overdosage: If you think you have taken too much of this medicine contact a poison control center or emergency room at once. NOTE: This medicine is only for you. Do not share this medicine with others. What if I miss a dose? It is important not to miss a dose. Call your doctor or health care professional if you are unable to keep an appointment. What may interact with this medicine? -dofetilide -foscarnet -medicines for seizures -medicines to increase blood counts like filgrastim,  pegfilgrastim, sargramostim -probenecid -pyridoxine used with altretamine -rituximab -some antibiotics like amikacin, gentamicin, neomycin, polymyxin B, streptomycin, tobramycin -sulfinpyrazone -vaccines -zalcitabine Talk to your doctor or health care professional before taking any of these medicines: -acetaminophen -aspirin -ibuprofen -ketoprofen -naproxen This list may not describe all possible interactions. Give your health care provider a list of all the medicines, herbs, non-prescription drugs, or dietary supplements you use. Also tell them if you smoke, drink alcohol, or use illegal drugs. Some items may interact with your medicine. What should I watch for while using this medicine? Your condition will be monitored carefully while you are receiving this medicine. You will need important blood work done while you are taking this medicine. This drug may make you feel generally unwell. This is not uncommon, as chemotherapy can affect healthy cells as well as cancer cells. Report any side effects. Continue your course of treatment even though you feel ill unless your doctor tells you to stop. In some cases, you may be given additional medicines to help with side effects. Follow all directions for their use. Call your doctor or health care professional for advice if you get a fever, chills or sore throat, or other symptoms of a cold or flu. Do not treat yourself. This drug decreases your body's ability to fight infections. Try to avoid being around people  who are sick. This medicine may increase your risk to bruise or bleed. Call your doctor or health care professional if you notice any unusual bleeding. Be careful brushing and flossing your teeth or using a toothpick because you may get an infection or bleed more easily. If you have any dental work done, tell your dentist you are receiving this medicine. Avoid taking products that contain aspirin, acetaminophen, ibuprofen, naproxen, or  ketoprofen unless instructed by your doctor. These medicines may hide a fever. Do not become pregnant while taking this medicine. Women should inform their doctor if they wish to become pregnant or think they might be pregnant. There is a potential for serious side effects to an unborn child. Talk to your health care professional or pharmacist for more information. Do not breast-feed an infant while taking this medicine. Drink fluids as directed while you are taking this medicine. This will help protect your kidneys. Call your doctor or health care professional if you get diarrhea. Do not treat yourself. What side effects may I notice from receiving this medicine? Side effects that you should report to your doctor or health care professional as soon as possible: -allergic reactions like skin rash, itching or hives, swelling of the face, lips, or tongue -signs of infection - fever or chills, cough, sore throat, pain or difficulty passing urine -signs of decreased platelets or bleeding - bruising, pinpoint red spots on the skin, black, tarry stools, nosebleeds -signs of decreased red blood cells - unusually weak or tired, fainting spells, lightheadedness -breathing problems -changes in hearing -gout pain -low blood counts - This drug may decrease the number of white blood cells, red blood cells and platelets. You may be at increased risk for infections and bleeding. -nausea and vomiting -pain, swelling, redness or irritation at the injection site -pain, tingling, numbness in the hands or feet -problems with balance, movement -trouble passing urine or change in the amount of urine Side effects that usually do not require medical attention (report to your doctor or health care professional if they continue or are bothersome): -changes in vision -loss of appetite -metallic taste in the mouth or changes in taste This list may not describe all possible side effects. Call your doctor for medical  advice about side effects. You may report side effects to FDA at 1-800-FDA-1088. Where should I keep my medicine? This drug is given in a hospital or clinic and will not be stored at home. NOTE: This sheet is a summary. It may not cover all possible information. If you have questions about this medicine, talk to your doctor, pharmacist, or health care provider.    2016, [employer]. (2007-08-03 14:40:54)

## 2016-04-14 NOTE — Assessment & Plan Note (Signed)
This is stable and improving. Reduced dose as above

## 2016-04-14 NOTE — Progress Notes (Signed)
Todd [name] is here for his 14th fraction of radiation to his Left Tonsil and bilateral neck. He denies pain. He reports some mild fatigue, especially in the afternoon. He reports to be eating well. He has gained weight this week. He is not using his feeding tube for nutrition at this time. He is drinking about 1-2 nutritional supplements daily. He does report taste changes which has altered his appetite. His neck is slightly red, and he is using the sonafine cream as directed. He has some nausea at times. He will receive chemotherapy today, and plans to begin taking nausea medicine as a preventative.   BP 117/73   Pulse 72   Temp 98.4 F (36.9 C)   Ht 5\' 10"  (1.778 m)   Wt 174 lb 12.8 oz (79.3 kg)   SpO2 99% Comment: room air  BMI 25.08 kg/m    Wt Readings from Last 3 Encounters:  04/14/16 174 lb 12.8 oz (79.3 kg)  04/08/16 177 lb 8 oz (80.5 kg)  04/07/16 169 lb 12.8 oz (77 kg)

## 2016-04-14 NOTE — Assessment & Plan Note (Signed)
He has unintentional weight loss due to recent poor appetite and altered taste sensation. He is able to tolerate 100% of by mouth.

## 2016-04-14 NOTE — Assessment & Plan Note (Signed)
He had history of chemotherapy-induced nausea and vomiting. I have recommend daily IV fluids this week and additional IV dexamethasone for 2 days after chemotherapy

## 2016-04-14 NOTE — Assessment & Plan Note (Signed)
This is likely due to recent treatment. The patient denies recent history of fevers, cough, chills, diarrhea or dysuria. He is asymptomatic from the leukopenia. I will observe for now.  We will proceed with treatment today with dose adjustment as planned

## 2016-04-14 NOTE — Assessment & Plan Note (Signed)
He is doing well. I plan to reduce the dose of chemotherapy in the future by 20% due to tinnitus He is mildly neutropenic but asymptomatic We will proceed with rx today

## 2016-04-15 ENCOUNTER — Ambulatory Visit (HOSPITAL_BASED_OUTPATIENT_CLINIC_OR_DEPARTMENT_OTHER): Payer: BLUE CROSS/BLUE SHIELD

## 2016-04-15 ENCOUNTER — Other Ambulatory Visit: Payer: Self-pay | Admitting: Hematology and Oncology

## 2016-04-15 ENCOUNTER — Ambulatory Visit
Admission: RE | Admit: 2016-04-15 | Discharge: 2016-04-15 | Disposition: A | Discharge status: Home / Self Care | Payer: BLUE CROSS/BLUE SHIELD | Source: Ambulatory Visit | Attending: Radiation Oncology | Admitting: Radiation Oncology

## 2016-04-15 VITALS — BP 128/82 | HR 68 | Temp 97.8°F | Resp 17

## 2016-04-15 DIAGNOSIS — E860 Dehydration: Secondary | ICD-10-CM

## 2016-04-15 DIAGNOSIS — I10 Essential (primary) hypertension: Secondary | ICD-10-CM | POA: Diagnosis not present

## 2016-04-15 DIAGNOSIS — E785 Hyperlipidemia, unspecified: Secondary | ICD-10-CM | POA: Diagnosis not present

## 2016-04-15 DIAGNOSIS — Z79899 Other long term (current) drug therapy: Secondary | ICD-10-CM | POA: Diagnosis not present

## 2016-04-15 DIAGNOSIS — R1310 Dysphagia, unspecified: Secondary | ICD-10-CM | POA: Diagnosis not present

## 2016-04-15 DIAGNOSIS — M1990 Unspecified osteoarthritis, unspecified site: Secondary | ICD-10-CM | POA: Diagnosis not present

## 2016-04-15 DIAGNOSIS — C090 Malignant neoplasm of tonsillar fossa: Secondary | ICD-10-CM | POA: Diagnosis not present

## 2016-04-15 DIAGNOSIS — Z87891 Personal history of nicotine dependence: Secondary | ICD-10-CM | POA: Diagnosis not present

## 2016-04-15 DIAGNOSIS — Z809 Family history of malignant neoplasm, unspecified: Secondary | ICD-10-CM | POA: Diagnosis not present

## 2016-04-15 DIAGNOSIS — Z510 Encounter for antineoplastic radiation therapy: Secondary | ICD-10-CM | POA: Diagnosis not present

## 2016-04-15 DIAGNOSIS — C099 Malignant neoplasm of tonsil, unspecified: Secondary | ICD-10-CM

## 2016-04-15 DIAGNOSIS — Z7982 Long term (current) use of aspirin: Secondary | ICD-10-CM | POA: Diagnosis not present

## 2016-04-15 DIAGNOSIS — E039 Hypothyroidism, unspecified: Secondary | ICD-10-CM | POA: Diagnosis not present

## 2016-04-15 MED ORDER — SODIUM CHLORIDE 0.9% FLUSH
10.0000 mL | INTRAVENOUS | Status: DC | PRN
  Administered 2016-04-15: 10 mL
  Filled 2016-04-15: qty 10

## 2016-04-15 MED ORDER — HEPARIN SOD (PORK) LOCK FLUSH 100 UNIT/ML IV SOLN
500.0000 [IU] | Freq: Once | INTRAVENOUS | Status: AC | PRN
  Administered 2016-04-15: 500 [IU]
  Filled 2016-04-15: qty 5

## 2016-04-15 MED ORDER — DEXAMETHASONE SODIUM PHOSPHATE 10 MG/ML IJ SOLN
10.0000 mg | Freq: Once | INTRAMUSCULAR | Status: AC
  Administered 2016-04-15: 10 mg via INTRAVENOUS

## 2016-04-15 MED ORDER — DEXAMETHASONE SODIUM PHOSPHATE 10 MG/ML IJ SOLN
INTRAMUSCULAR | Status: AC
  Filled 2016-04-15: qty 1

## 2016-04-15 MED ORDER — SODIUM CHLORIDE 0.9 % IV SOLN
Freq: Once | INTRAVENOUS | Status: AC
  Administered 2016-04-15: 09:00:00 via INTRAVENOUS

## 2016-04-15 NOTE — Patient Instructions (Signed)
Dehydration, Adult Dehydration is a condition in which there is not enough fluid or water in the body. This happens when you lose more fluids than you take in. Important organs, such as the kidneys, brain, and heart, cannot function without a proper amount of fluids. Any loss of fluids from the body can lead to dehydration. Dehydration can range from mild to severe. This condition should be treated right away to prevent it from becoming severe. What are the causes? This condition may be caused by:  Vomiting.  Diarrhea.  Excessive sweating, such as from heat exposure or exercise.  Not drinking enough fluid, especially:  When ill.  While doing activity that requires a lot of energy.  Excessive urination.  Fever.  Infection.  Certain medicines, such as medicines that cause the body to lose excess fluid (diuretics).  Inability to access safe drinking water.  Reduced physical ability to get adequate water and food. What increases the risk? This condition is more likely to develop in people:  Who have a poorly controlled long-term (chronic) illness, such as diabetes, heart disease, or kidney disease.  Who are age 65 or older.  Who are disabled.  Who live in a place with high altitude.  Who play endurance sports. What are the signs or symptoms? Symptoms of mild dehydration may include:   Thirst.  Dry lips.  Slightly dry mouth.  Dry, warm skin.  Dizziness. Symptoms of moderate dehydration may include:   Very dry mouth.  Muscle cramps.  Dark urine. Urine may be the color of tea.  Decreased urine production.  Decreased tear production.  Heartbeat that is irregular or faster than normal (palpitations).  Headache.  Light-headedness, especially when you stand up from a sitting position.  Fainting (syncope). Symptoms of severe dehydration may include:   Changes in skin, such as:  Cold and clammy skin.  Blotchy (mottled) or pale skin.  Skin that does  not quickly return to normal after being lightly pinched and released (poor skin turgor).  Changes in body fluids, such as:  Extreme thirst.  No tear production.  Inability to sweat when body temperature is high, such as in hot weather.  Very little urine production.  Changes in vital signs, such as:  Weak pulse.  Pulse that is more than 100 beats a minute when sitting still.  Rapid breathing.  Low blood pressure.  Other changes, such as:  Sunken eyes.  Cold hands and feet.  Confusion.  Lack of energy (lethargy).  Difficulty waking up from sleep.  Short-term weight loss.  Unconsciousness. How is this diagnosed? This condition is diagnosed based on your symptoms and a physical exam. Blood and urine tests may be done to help confirm the diagnosis. How is this treated? Treatment for this condition depends on the severity. Mild or moderate dehydration can often be treated at home. Treatment should be started right away. Do not wait until dehydration becomes severe. Severe dehydration is an emergency and it needs to be treated in a hospital. Treatment for mild dehydration may include:   Drinking more fluids.  Replacing salts and minerals in your blood (electrolytes) that you may have lost. Treatment for moderate dehydration may include:   Drinking an oral rehydration solution (ORS). This is a drink that helps you replace fluids and electrolytes (rehydrate). It can be found at pharmacies and retail stores. Treatment for severe dehydration may include:   Receiving fluids through an IV tube.  Receiving an electrolyte solution through a feeding tube that is   passed through your nose and into your stomach (nasogastric tube, or NG tube).  Correcting any abnormalities in electrolytes.  Treating the underlying cause of dehydration. Follow these instructions at home:  If directed by your health care provider, drink an ORS:  Make an ORS by following instructions on the  package.  Start by drinking small amounts, about  cup (120 mL) every 5-10 minutes.  Slowly increase how much you drink until you have taken the amount recommended by your health care provider.  Drink enough clear fluid to keep your urine clear or pale yellow. If you were told to drink an ORS, finish the ORS first, then start slowly drinking other clear fluids. Drink fluids such as:  Water. Do not drink only water. Doing that can lead to having too little salt (sodium) in the body (hyponatremia).  Ice chips.  Fruit juice that you have added water to (diluted fruit juice).  Low-calorie sports drinks.  Avoid:  Alcohol.  Drinks that contain a lot of sugar. These include high-calorie sports drinks, fruit juice that is not diluted, and soda.  Caffeine.  Foods that are greasy or contain a lot of fat or sugar.  Take over-the-counter and prescription medicines only as told by your health care provider.  Do not take sodium tablets. This can lead to having too much sodium in the body (hypernatremia).  Eat foods that contain a healthy balance of electrolytes, such as bananas, oranges, potatoes, tomatoes, and spinach.  Keep all follow-up visits as told by your health care provider. This is important. Contact a health care provider if:  You have abdominal pain that:  Gets worse.  Stays in one area (localizes).  You have a rash.  You have a stiff neck.  You are more irritable than usual.  You are sleepier or more difficult to wake up than usual.  You feel weak or dizzy.  You feel very thirsty.  You have urinated only a small amount of very dark urine over 6-8 hours. Get help right away if:  You have symptoms of severe dehydration.  You cannot drink fluids without vomiting.  Your symptoms get worse with treatment.  You have a fever.  You have a severe headache.  You have vomiting or diarrhea that:  Gets worse.  Does not go away.  You have blood or green matter  (bile) in your vomit.  You have blood in your stool. This may cause stool to look black and tarry.  You have not urinated in 6-8 hours.  You faint.  Your heart rate while sitting still is over 100 beats a minute.  You have trouble breathing. This information is not intended to replace advice given to you by your health care provider. Make sure you discuss any questions you have with your health care provider. Document Released: 04/28/2005 Document Revised: 11/23/2015 Document Reviewed: 06/22/2015 Elsevier Interactive Patient Education  2017 Elsevier Inc.  

## 2016-04-16 ENCOUNTER — Ambulatory Visit (HOSPITAL_BASED_OUTPATIENT_CLINIC_OR_DEPARTMENT_OTHER): Payer: BLUE CROSS/BLUE SHIELD

## 2016-04-16 ENCOUNTER — Ambulatory Visit
Admission: RE | Admit: 2016-04-16 | Discharge: 2016-04-16 | Disposition: A | Discharge status: Home / Self Care | Payer: BLUE CROSS/BLUE SHIELD | Source: Ambulatory Visit | Attending: Radiation Oncology | Admitting: Radiation Oncology

## 2016-04-16 VITALS — BP 125/78 | HR 57 | Temp 98.0°F | Resp 16

## 2016-04-16 DIAGNOSIS — C090 Malignant neoplasm of tonsillar fossa: Secondary | ICD-10-CM | POA: Diagnosis not present

## 2016-04-16 DIAGNOSIS — E039 Hypothyroidism, unspecified: Secondary | ICD-10-CM | POA: Diagnosis not present

## 2016-04-16 DIAGNOSIS — E860 Dehydration: Secondary | ICD-10-CM | POA: Diagnosis not present

## 2016-04-16 DIAGNOSIS — R1310 Dysphagia, unspecified: Secondary | ICD-10-CM | POA: Diagnosis not present

## 2016-04-16 DIAGNOSIS — I10 Essential (primary) hypertension: Secondary | ICD-10-CM | POA: Diagnosis not present

## 2016-04-16 DIAGNOSIS — Z510 Encounter for antineoplastic radiation therapy: Secondary | ICD-10-CM | POA: Diagnosis not present

## 2016-04-16 DIAGNOSIS — Z87891 Personal history of nicotine dependence: Secondary | ICD-10-CM | POA: Diagnosis not present

## 2016-04-16 DIAGNOSIS — M1990 Unspecified osteoarthritis, unspecified site: Secondary | ICD-10-CM | POA: Diagnosis not present

## 2016-04-16 DIAGNOSIS — Z809 Family history of malignant neoplasm, unspecified: Secondary | ICD-10-CM | POA: Diagnosis not present

## 2016-04-16 DIAGNOSIS — C099 Malignant neoplasm of tonsil, unspecified: Secondary | ICD-10-CM

## 2016-04-16 DIAGNOSIS — Z79899 Other long term (current) drug therapy: Secondary | ICD-10-CM | POA: Diagnosis not present

## 2016-04-16 DIAGNOSIS — Z7982 Long term (current) use of aspirin: Secondary | ICD-10-CM | POA: Diagnosis not present

## 2016-04-16 DIAGNOSIS — E785 Hyperlipidemia, unspecified: Secondary | ICD-10-CM | POA: Diagnosis not present

## 2016-04-16 MED ORDER — HEPARIN SOD (PORK) LOCK FLUSH 100 UNIT/ML IV SOLN
500.0000 [IU] | Freq: Once | INTRAVENOUS | Status: AC | PRN
  Administered 2016-04-16: 500 [IU]
  Filled 2016-04-16: qty 5

## 2016-04-16 MED ORDER — DEXAMETHASONE SODIUM PHOSPHATE 10 MG/ML IJ SOLN
INTRAMUSCULAR | Status: AC
  Filled 2016-04-16: qty 1

## 2016-04-16 MED ORDER — SODIUM CHLORIDE 0.9 % IV SOLN
INTRAVENOUS | Status: AC
  Administered 2016-04-16: 09:00:00 via INTRAVENOUS

## 2016-04-16 MED ORDER — DEXAMETHASONE SODIUM PHOSPHATE 10 MG/ML IJ SOLN
10.0000 mg | Freq: Once | INTRAMUSCULAR | Status: AC
  Administered 2016-04-16: 10 mg via INTRAVENOUS

## 2016-04-16 MED ORDER — SODIUM CHLORIDE 0.9% FLUSH
10.0000 mL | INTRAVENOUS | Status: DC | PRN
  Administered 2016-04-16: 10 mL
  Filled 2016-04-16: qty 10

## 2016-04-16 NOTE — Patient Instructions (Signed)
Dehydration, Adult Dehydration is a condition in which there is not enough fluid or water in the body. This happens when you lose more fluids than you take in. Important organs, such as the kidneys, brain, and heart, cannot function without a proper amount of fluids. Any loss of fluids from the body can lead to dehydration. Dehydration can range from mild to severe. This condition should be treated right away to prevent it from becoming severe. What are the causes? This condition may be caused by:  Vomiting.  Diarrhea.  Excessive sweating, such as from heat exposure or exercise.  Not drinking enough fluid, especially:  When ill.  While doing activity that requires a lot of energy.  Excessive urination.  Fever.  Infection.  Certain medicines, such as medicines that cause the body to lose excess fluid (diuretics).  Inability to access safe drinking water.  Reduced physical ability to get adequate water and food. What increases the risk? This condition is more likely to develop in people:  Who have a poorly controlled long-term (chronic) illness, such as diabetes, heart disease, or kidney disease.  Who are age 65 or older.  Who are disabled.  Who live in a place with high altitude.  Who play endurance sports. What are the signs or symptoms? Symptoms of mild dehydration may include:   Thirst.  Dry lips.  Slightly dry mouth.  Dry, warm skin.  Dizziness. Symptoms of moderate dehydration may include:   Very dry mouth.  Muscle cramps.  Dark urine. Urine may be the color of tea.  Decreased urine production.  Decreased tear production.  Heartbeat that is irregular or faster than normal (palpitations).  Headache.  Light-headedness, especially when you stand up from a sitting position.  Fainting (syncope). Symptoms of severe dehydration may include:   Changes in skin, such as:  Cold and clammy skin.  Blotchy (mottled) or pale skin.  Skin that does  not quickly return to normal after being lightly pinched and released (poor skin turgor).  Changes in body fluids, such as:  Extreme thirst.  No tear production.  Inability to sweat when body temperature is high, such as in hot weather.  Very little urine production.  Changes in vital signs, such as:  Weak pulse.  Pulse that is more than 100 beats a minute when sitting still.  Rapid breathing.  Low blood pressure.  Other changes, such as:  Sunken eyes.  Cold hands and feet.  Confusion.  Lack of energy (lethargy).  Difficulty waking up from sleep.  Short-term weight loss.  Unconsciousness. How is this diagnosed? This condition is diagnosed based on your symptoms and a physical exam. Blood and urine tests may be done to help confirm the diagnosis. How is this treated? Treatment for this condition depends on the severity. Mild or moderate dehydration can often be treated at home. Treatment should be started right away. Do not wait until dehydration becomes severe. Severe dehydration is an emergency and it needs to be treated in a hospital. Treatment for mild dehydration may include:   Drinking more fluids.  Replacing salts and minerals in your blood (electrolytes) that you may have lost. Treatment for moderate dehydration may include:   Drinking an oral rehydration solution (ORS). This is a drink that helps you replace fluids and electrolytes (rehydrate). It can be found at pharmacies and retail stores. Treatment for severe dehydration may include:   Receiving fluids through an IV tube.  Receiving an electrolyte solution through a feeding tube that is   passed through your nose and into your stomach (nasogastric tube, or NG tube).  Correcting any abnormalities in electrolytes.  Treating the underlying cause of dehydration. Follow these instructions at home:  If directed by your health care provider, drink an ORS:  Make an ORS by following instructions on the  package.  Start by drinking small amounts, about  cup (120 mL) every 5-10 minutes.  Slowly increase how much you drink until you have taken the amount recommended by your health care provider.  Drink enough clear fluid to keep your urine clear or pale yellow. If you were told to drink an ORS, finish the ORS first, then start slowly drinking other clear fluids. Drink fluids such as:  Water. Do not drink only water. Doing that can lead to having too little salt (sodium) in the body (hyponatremia).  Ice chips.  Fruit juice that you have added water to (diluted fruit juice).  Low-calorie sports drinks.  Avoid:  Alcohol.  Drinks that contain a lot of sugar. These include high-calorie sports drinks, fruit juice that is not diluted, and soda.  Caffeine.  Foods that are greasy or contain a lot of fat or sugar.  Take over-the-counter and prescription medicines only as told by your health care provider.  Do not take sodium tablets. This can lead to having too much sodium in the body (hypernatremia).  Eat foods that contain a healthy balance of electrolytes, such as bananas, oranges, potatoes, tomatoes, and spinach.  Keep all follow-up visits as told by your health care provider. This is important. Contact a health care provider if:  You have abdominal pain that:  Gets worse.  Stays in one area (localizes).  You have a rash.  You have a stiff neck.  You are more irritable than usual.  You are sleepier or more difficult to wake up than usual.  You feel weak or dizzy.  You feel very thirsty.  You have urinated only a small amount of very dark urine over 6-8 hours. Get help right away if:  You have symptoms of severe dehydration.  You cannot drink fluids without vomiting.  Your symptoms get worse with treatment.  You have a fever.  You have a severe headache.  You have vomiting or diarrhea that:  Gets worse.  Does not go away.  You have blood or green matter  (bile) in your vomit.  You have blood in your stool. This may cause stool to look black and tarry.  You have not urinated in 6-8 hours.  You faint.  Your heart rate while sitting still is over 100 beats a minute.  You have trouble breathing. This information is not intended to replace advice given to you by your health care provider. Make sure you discuss any questions you have with your health care provider. Document Released: 04/28/2005 Document Revised: 11/23/2015 Document Reviewed: 06/22/2015 Elsevier Interactive Patient Education  2017 Elsevier Inc.  

## 2016-04-17 ENCOUNTER — Ambulatory Visit
Admission: RE | Admit: 2016-04-17 | Discharge: 2016-04-17 | Disposition: A | Discharge status: Home / Self Care | Payer: BLUE CROSS/BLUE SHIELD | Source: Ambulatory Visit | Attending: Radiation Oncology | Admitting: Radiation Oncology

## 2016-04-17 ENCOUNTER — Ambulatory Visit (HOSPITAL_BASED_OUTPATIENT_CLINIC_OR_DEPARTMENT_OTHER): Payer: BLUE CROSS/BLUE SHIELD

## 2016-04-17 DIAGNOSIS — E785 Hyperlipidemia, unspecified: Secondary | ICD-10-CM | POA: Diagnosis not present

## 2016-04-17 DIAGNOSIS — Z510 Encounter for antineoplastic radiation therapy: Secondary | ICD-10-CM | POA: Diagnosis not present

## 2016-04-17 DIAGNOSIS — E039 Hypothyroidism, unspecified: Secondary | ICD-10-CM | POA: Diagnosis not present

## 2016-04-17 DIAGNOSIS — R1310 Dysphagia, unspecified: Secondary | ICD-10-CM | POA: Diagnosis not present

## 2016-04-17 DIAGNOSIS — E860 Dehydration: Secondary | ICD-10-CM | POA: Diagnosis not present

## 2016-04-17 DIAGNOSIS — I10 Essential (primary) hypertension: Secondary | ICD-10-CM | POA: Diagnosis not present

## 2016-04-17 DIAGNOSIS — Z87891 Personal history of nicotine dependence: Secondary | ICD-10-CM | POA: Diagnosis not present

## 2016-04-17 DIAGNOSIS — C099 Malignant neoplasm of tonsil, unspecified: Secondary | ICD-10-CM

## 2016-04-17 DIAGNOSIS — Z809 Family history of malignant neoplasm, unspecified: Secondary | ICD-10-CM | POA: Diagnosis not present

## 2016-04-17 DIAGNOSIS — C090 Malignant neoplasm of tonsillar fossa: Secondary | ICD-10-CM | POA: Diagnosis not present

## 2016-04-17 DIAGNOSIS — Z7982 Long term (current) use of aspirin: Secondary | ICD-10-CM | POA: Diagnosis not present

## 2016-04-17 DIAGNOSIS — Z79899 Other long term (current) drug therapy: Secondary | ICD-10-CM | POA: Diagnosis not present

## 2016-04-17 DIAGNOSIS — M1990 Unspecified osteoarthritis, unspecified site: Secondary | ICD-10-CM | POA: Diagnosis not present

## 2016-04-17 MED ORDER — SODIUM CHLORIDE 0.9% FLUSH
10.0000 mL | Freq: Once | INTRAVENOUS | Status: AC
  Administered 2016-04-17: 10 mL
  Filled 2016-04-17: qty 10

## 2016-04-17 MED ORDER — HEPARIN SOD (PORK) LOCK FLUSH 100 UNIT/ML IV SOLN
250.0000 [IU] | Freq: Once | INTRAVENOUS | Status: AC | PRN
  Administered 2016-04-17: 250 [IU]
  Filled 2016-04-17: qty 5

## 2016-04-17 MED ORDER — SODIUM CHLORIDE 0.9% FLUSH
10.0000 mL | INTRAVENOUS | Status: DC | PRN
  Administered 2016-04-17: 10 mL
  Filled 2016-04-17: qty 10

## 2016-04-17 MED ORDER — SODIUM CHLORIDE 0.9 % IV SOLN
Freq: Once | INTRAVENOUS | Status: AC
  Administered 2016-04-17: 09:00:00 via INTRAVENOUS

## 2016-04-17 NOTE — Addendum Note (Signed)
Addended by: Wilmon Arms on: 04/17/2016 11:09 AM   Modules accepted: Orders

## 2016-04-17 NOTE — Patient Instructions (Signed)
Dehydration, Adult Dehydration is a condition in which there is not enough fluid or water in the body. This happens when you lose more fluids than you take in. Important organs, such as the kidneys, brain, and heart, cannot function without a proper amount of fluids. Any loss of fluids from the body can lead to dehydration. Dehydration can range from mild to severe. This condition should be treated right away to prevent it from becoming severe. What are the causes? This condition may be caused by:  Vomiting.  Diarrhea.  Excessive sweating, such as from heat exposure or exercise.  Not drinking enough fluid, especially:  When ill.  While doing activity that requires a lot of energy.  Excessive urination.  Fever.  Infection.  Certain medicines, such as medicines that cause the body to lose excess fluid (diuretics).  Inability to access safe drinking water.  Reduced physical ability to get adequate water and food. What increases the risk? This condition is more likely to develop in people:  Who have a poorly controlled long-term (chronic) illness, such as diabetes, heart disease, or kidney disease.  Who are age 65 or older.  Who are disabled.  Who live in a place with high altitude.  Who play endurance sports. What are the signs or symptoms? Symptoms of mild dehydration may include:   Thirst.  Dry lips.  Slightly dry mouth.  Dry, warm skin.  Dizziness. Symptoms of moderate dehydration may include:   Very dry mouth.  Muscle cramps.  Dark urine. Urine may be the color of tea.  Decreased urine production.  Decreased tear production.  Heartbeat that is irregular or faster than normal (palpitations).  Headache.  Light-headedness, especially when you stand up from a sitting position.  Fainting (syncope). Symptoms of severe dehydration may include:   Changes in skin, such as:  Cold and clammy skin.  Blotchy (mottled) or pale skin.  Skin that does  not quickly return to normal after being lightly pinched and released (poor skin turgor).  Changes in body fluids, such as:  Extreme thirst.  No tear production.  Inability to sweat when body temperature is high, such as in hot weather.  Very little urine production.  Changes in vital signs, such as:  Weak pulse.  Pulse that is more than 100 beats a minute when sitting still.  Rapid breathing.  Low blood pressure.  Other changes, such as:  Sunken eyes.  Cold hands and feet.  Confusion.  Lack of energy (lethargy).  Difficulty waking up from sleep.  Short-term weight loss.  Unconsciousness. How is this diagnosed? This condition is diagnosed based on your symptoms and a physical exam. Blood and urine tests may be done to help confirm the diagnosis. How is this treated? Treatment for this condition depends on the severity. Mild or moderate dehydration can often be treated at home. Treatment should be started right away. Do not wait until dehydration becomes severe. Severe dehydration is an emergency and it needs to be treated in a hospital. Treatment for mild dehydration may include:   Drinking more fluids.  Replacing salts and minerals in your blood (electrolytes) that you may have lost. Treatment for moderate dehydration may include:   Drinking an oral rehydration solution (ORS). This is a drink that helps you replace fluids and electrolytes (rehydrate). It can be found at pharmacies and retail stores. Treatment for severe dehydration may include:   Receiving fluids through an IV tube.  Receiving an electrolyte solution through a feeding tube that is   passed through your nose and into your stomach (nasogastric tube, or NG tube).  Correcting any abnormalities in electrolytes.  Treating the underlying cause of dehydration. Follow these instructions at home:  If directed by your health care provider, drink an ORS:  Make an ORS by following instructions on the  package.  Start by drinking small amounts, about  cup (120 mL) every 5-10 minutes.  Slowly increase how much you drink until you have taken the amount recommended by your health care provider.  Drink enough clear fluid to keep your urine clear or pale yellow. If you were told to drink an ORS, finish the ORS first, then start slowly drinking other clear fluids. Drink fluids such as:  Water. Do not drink only water. Doing that can lead to having too little salt (sodium) in the body (hyponatremia).  Ice chips.  Fruit juice that you have added water to (diluted fruit juice).  Low-calorie sports drinks.  Avoid:  Alcohol.  Drinks that contain a lot of sugar. These include high-calorie sports drinks, fruit juice that is not diluted, and soda.  Caffeine.  Foods that are greasy or contain a lot of fat or sugar.  Take over-the-counter and prescription medicines only as told by your health care provider.  Do not take sodium tablets. This can lead to having too much sodium in the body (hypernatremia).  Eat foods that contain a healthy balance of electrolytes, such as bananas, oranges, potatoes, tomatoes, and spinach.  Keep all follow-up visits as told by your health care provider. This is important. Contact a health care provider if:  You have abdominal pain that:  Gets worse.  Stays in one area (localizes).  You have a rash.  You have a stiff neck.  You are more irritable than usual.  You are sleepier or more difficult to wake up than usual.  You feel weak or dizzy.  You feel very thirsty.  You have urinated only a small amount of very dark urine over 6-8 hours. Get help right away if:  You have symptoms of severe dehydration.  You cannot drink fluids without vomiting.  Your symptoms get worse with treatment.  You have a fever.  You have a severe headache.  You have vomiting or diarrhea that:  Gets worse.  Does not go away.  You have blood or green matter  (bile) in your vomit.  You have blood in your stool. This may cause stool to look black and tarry.  You have not urinated in 6-8 hours.  You faint.  Your heart rate while sitting still is over 100 beats a minute.  You have trouble breathing. This information is not intended to replace advice given to you by your health care provider. Make sure you discuss any questions you have with your health care provider. Document Released: 04/28/2005 Document Revised: 11/23/2015 Document Reviewed: 06/22/2015 Elsevier Interactive Patient Education  2017 Elsevier Inc.  

## 2016-04-18 ENCOUNTER — Ambulatory Visit
Admission: RE | Admit: 2016-04-18 | Discharge: 2016-04-18 | Disposition: A | Discharge status: Home / Self Care | Payer: BLUE CROSS/BLUE SHIELD | Source: Ambulatory Visit | Attending: Radiation Oncology | Admitting: Radiation Oncology

## 2016-04-18 ENCOUNTER — Ambulatory Visit (HOSPITAL_BASED_OUTPATIENT_CLINIC_OR_DEPARTMENT_OTHER): Payer: BLUE CROSS/BLUE SHIELD

## 2016-04-18 VITALS — BP 110/68 | HR 55 | Temp 98.4°F | Resp 16

## 2016-04-18 DIAGNOSIS — E860 Dehydration: Secondary | ICD-10-CM | POA: Diagnosis not present

## 2016-04-18 DIAGNOSIS — Z7982 Long term (current) use of aspirin: Secondary | ICD-10-CM | POA: Diagnosis not present

## 2016-04-18 DIAGNOSIS — E039 Hypothyroidism, unspecified: Secondary | ICD-10-CM | POA: Diagnosis not present

## 2016-04-18 DIAGNOSIS — R1310 Dysphagia, unspecified: Secondary | ICD-10-CM | POA: Diagnosis not present

## 2016-04-18 DIAGNOSIS — C099 Malignant neoplasm of tonsil, unspecified: Secondary | ICD-10-CM | POA: Diagnosis not present

## 2016-04-18 DIAGNOSIS — Z87891 Personal history of nicotine dependence: Secondary | ICD-10-CM | POA: Diagnosis not present

## 2016-04-18 DIAGNOSIS — Z809 Family history of malignant neoplasm, unspecified: Secondary | ICD-10-CM | POA: Diagnosis not present

## 2016-04-18 DIAGNOSIS — I10 Essential (primary) hypertension: Secondary | ICD-10-CM | POA: Diagnosis not present

## 2016-04-18 DIAGNOSIS — E785 Hyperlipidemia, unspecified: Secondary | ICD-10-CM | POA: Diagnosis not present

## 2016-04-18 DIAGNOSIS — Z510 Encounter for antineoplastic radiation therapy: Secondary | ICD-10-CM | POA: Diagnosis not present

## 2016-04-18 DIAGNOSIS — M1990 Unspecified osteoarthritis, unspecified site: Secondary | ICD-10-CM | POA: Diagnosis not present

## 2016-04-18 DIAGNOSIS — Z79899 Other long term (current) drug therapy: Secondary | ICD-10-CM | POA: Diagnosis not present

## 2016-04-18 DIAGNOSIS — C090 Malignant neoplasm of tonsillar fossa: Secondary | ICD-10-CM | POA: Diagnosis not present

## 2016-04-18 MED ORDER — HEPARIN SOD (PORK) LOCK FLUSH 100 UNIT/ML IV SOLN
500.0000 [IU] | Freq: Once | INTRAVENOUS | Status: AC | PRN
  Administered 2016-04-18: 500 [IU]
  Filled 2016-04-18: qty 5

## 2016-04-18 MED ORDER — SODIUM CHLORIDE 0.9 % IV SOLN
Freq: Once | INTRAVENOUS | Status: AC
  Administered 2016-04-18: 09:00:00 via INTRAVENOUS

## 2016-04-18 MED ORDER — SODIUM CHLORIDE 0.9% FLUSH
10.0000 mL | INTRAVENOUS | Status: DC | PRN
Start: 2016-04-18 — End: 2016-04-18
  Administered 2016-04-18: 10 mL
  Filled 2016-04-18: qty 10

## 2016-04-18 NOTE — Patient Instructions (Signed)
Dehydration, Adult Dehydration is a condition in which there is not enough fluid or water in the body. This happens when you lose more fluids than you take in. Important organs, such as the kidneys, brain, and heart, cannot function without a proper amount of fluids. Any loss of fluids from the body can lead to dehydration. Dehydration can range from mild to severe. This condition should be treated right away to prevent it from becoming severe. What are the causes? This condition may be caused by:  Vomiting.  Diarrhea.  Excessive sweating, such as from heat exposure or exercise.  Not drinking enough fluid, especially:  When ill.  While doing activity that requires a lot of energy.  Excessive urination.  Fever.  Infection.  Certain medicines, such as medicines that cause the body to lose excess fluid (diuretics).  Inability to access safe drinking water.  Reduced physical ability to get adequate water and food. What increases the risk? This condition is more likely to develop in people:  Who have a poorly controlled long-term (chronic) illness, such as diabetes, heart disease, or kidney disease.  Who are age 65 or older.  Who are disabled.  Who live in a place with high altitude.  Who play endurance sports. What are the signs or symptoms? Symptoms of mild dehydration may include:   Thirst.  Dry lips.  Slightly dry mouth.  Dry, warm skin.  Dizziness. Symptoms of moderate dehydration may include:   Very dry mouth.  Muscle cramps.  Dark urine. Urine may be the color of tea.  Decreased urine production.  Decreased tear production.  Heartbeat that is irregular or faster than normal (palpitations).  Headache.  Light-headedness, especially when you stand up from a sitting position.  Fainting (syncope). Symptoms of severe dehydration may include:   Changes in skin, such as:  Cold and clammy skin.  Blotchy (mottled) or pale skin.  Skin that does  not quickly return to normal after being lightly pinched and released (poor skin turgor).  Changes in body fluids, such as:  Extreme thirst.  No tear production.  Inability to sweat when body temperature is high, such as in hot weather.  Very little urine production.  Changes in vital signs, such as:  Weak pulse.  Pulse that is more than 100 beats a minute when sitting still.  Rapid breathing.  Low blood pressure.  Other changes, such as:  Sunken eyes.  Cold hands and feet.  Confusion.  Lack of energy (lethargy).  Difficulty waking up from sleep.  Short-term weight loss.  Unconsciousness. How is this diagnosed? This condition is diagnosed based on your symptoms and a physical exam. Blood and urine tests may be done to help confirm the diagnosis. How is this treated? Treatment for this condition depends on the severity. Mild or moderate dehydration can often be treated at home. Treatment should be started right away. Do not wait until dehydration becomes severe. Severe dehydration is an emergency and it needs to be treated in a hospital. Treatment for mild dehydration may include:   Drinking more fluids.  Replacing salts and minerals in your blood (electrolytes) that you may have lost. Treatment for moderate dehydration may include:   Drinking an oral rehydration solution (ORS). This is a drink that helps you replace fluids and electrolytes (rehydrate). It can be found at pharmacies and retail stores. Treatment for severe dehydration may include:   Receiving fluids through an IV tube.  Receiving an electrolyte solution through a feeding tube that is   passed through your nose and into your stomach (nasogastric tube, or NG tube).  Correcting any abnormalities in electrolytes.  Treating the underlying cause of dehydration. Follow these instructions at home:  If directed by your health care provider, drink an ORS:  Make an ORS by following instructions on the  package.  Start by drinking small amounts, about  cup (120 mL) every 5-10 minutes.  Slowly increase how much you drink until you have taken the amount recommended by your health care provider.  Drink enough clear fluid to keep your urine clear or pale yellow. If you were told to drink an ORS, finish the ORS first, then start slowly drinking other clear fluids. Drink fluids such as:  Water. Do not drink only water. Doing that can lead to having too little salt (sodium) in the body (hyponatremia).  Ice chips.  Fruit juice that you have added water to (diluted fruit juice).  Low-calorie sports drinks.  Avoid:  Alcohol.  Drinks that contain a lot of sugar. These include high-calorie sports drinks, fruit juice that is not diluted, and soda.  Caffeine.  Foods that are greasy or contain a lot of fat or sugar.  Take over-the-counter and prescription medicines only as told by your health care provider.  Do not take sodium tablets. This can lead to having too much sodium in the body (hypernatremia).  Eat foods that contain a healthy balance of electrolytes, such as bananas, oranges, potatoes, tomatoes, and spinach.  Keep all follow-up visits as told by your health care provider. This is important. Contact a health care provider if:  You have abdominal pain that:  Gets worse.  Stays in one area (localizes).  You have a rash.  You have a stiff neck.  You are more irritable than usual.  You are sleepier or more difficult to wake up than usual.  You feel weak or dizzy.  You feel very thirsty.  You have urinated only a small amount of very dark urine over 6-8 hours. Get help right away if:  You have symptoms of severe dehydration.  You cannot drink fluids without vomiting.  Your symptoms get worse with treatment.  You have a fever.  You have a severe headache.  You have vomiting or diarrhea that:  Gets worse.  Does not go away.  You have blood or green matter  (bile) in your vomit.  You have blood in your stool. This may cause stool to look black and tarry.  You have not urinated in 6-8 hours.  You faint.  Your heart rate while sitting still is over 100 beats a minute.  You have trouble breathing. This information is not intended to replace advice given to you by your health care provider. Make sure you discuss any questions you have with your health care provider. Document Released: 04/28/2005 Document Revised: 11/23/2015 Document Reviewed: 06/22/2015 Elsevier Interactive Patient Education  2017 Elsevier Inc.  

## 2016-04-21 ENCOUNTER — Ambulatory Visit: Payer: BLUE CROSS/BLUE SHIELD | Admitting: Nutrition

## 2016-04-21 ENCOUNTER — Encounter: Payer: Self-pay | Admitting: Hematology and Oncology

## 2016-04-21 ENCOUNTER — Ambulatory Visit (HOSPITAL_BASED_OUTPATIENT_CLINIC_OR_DEPARTMENT_OTHER): Payer: BLUE CROSS/BLUE SHIELD | Admitting: Hematology and Oncology

## 2016-04-21 ENCOUNTER — Encounter: Payer: Self-pay | Admitting: Radiation Oncology

## 2016-04-21 ENCOUNTER — Ambulatory Visit
Admission: RE | Admit: 2016-04-21 | Discharge: 2016-04-21 | Disposition: A | Discharge status: Home / Self Care | Payer: BLUE CROSS/BLUE SHIELD | Source: Ambulatory Visit | Attending: Radiation Oncology | Admitting: Radiation Oncology

## 2016-04-21 ENCOUNTER — Ambulatory Visit (HOSPITAL_BASED_OUTPATIENT_CLINIC_OR_DEPARTMENT_OTHER): Payer: BLUE CROSS/BLUE SHIELD

## 2016-04-21 VITALS — BP 110/70 | HR 66 | Temp 99.1°F | Resp 19 | Ht 70.0 in | Wt 170.9 lb

## 2016-04-21 VITALS — BP 129/72 | HR 64 | Temp 99.0°F | Resp 17

## 2016-04-21 VITALS — Temp 98.4°F | Ht 70.0 in | Wt 170.6 lb

## 2016-04-21 DIAGNOSIS — K5909 Other constipation: Secondary | ICD-10-CM | POA: Diagnosis not present

## 2016-04-21 DIAGNOSIS — I10 Essential (primary) hypertension: Secondary | ICD-10-CM | POA: Diagnosis not present

## 2016-04-21 DIAGNOSIS — R05 Cough: Secondary | ICD-10-CM | POA: Diagnosis not present

## 2016-04-21 DIAGNOSIS — C090 Malignant neoplasm of tonsillar fossa: Secondary | ICD-10-CM

## 2016-04-21 DIAGNOSIS — R110 Nausea: Secondary | ICD-10-CM

## 2016-04-21 DIAGNOSIS — C099 Malignant neoplasm of tonsil, unspecified: Secondary | ICD-10-CM

## 2016-04-21 DIAGNOSIS — R1310 Dysphagia, unspecified: Secondary | ICD-10-CM | POA: Diagnosis not present

## 2016-04-21 DIAGNOSIS — Z510 Encounter for antineoplastic radiation therapy: Secondary | ICD-10-CM | POA: Diagnosis not present

## 2016-04-21 DIAGNOSIS — R634 Abnormal weight loss: Secondary | ICD-10-CM | POA: Diagnosis not present

## 2016-04-21 DIAGNOSIS — E039 Hypothyroidism, unspecified: Secondary | ICD-10-CM | POA: Diagnosis not present

## 2016-04-21 DIAGNOSIS — E785 Hyperlipidemia, unspecified: Secondary | ICD-10-CM | POA: Diagnosis not present

## 2016-04-21 DIAGNOSIS — R059 Cough, unspecified: Secondary | ICD-10-CM | POA: Insufficient documentation

## 2016-04-21 DIAGNOSIS — Z87891 Personal history of nicotine dependence: Secondary | ICD-10-CM | POA: Diagnosis not present

## 2016-04-21 DIAGNOSIS — M1990 Unspecified osteoarthritis, unspecified site: Secondary | ICD-10-CM | POA: Diagnosis not present

## 2016-04-21 DIAGNOSIS — Z809 Family history of malignant neoplasm, unspecified: Secondary | ICD-10-CM | POA: Diagnosis not present

## 2016-04-21 DIAGNOSIS — Z79899 Other long term (current) drug therapy: Secondary | ICD-10-CM | POA: Diagnosis not present

## 2016-04-21 DIAGNOSIS — Z7982 Long term (current) use of aspirin: Secondary | ICD-10-CM | POA: Diagnosis not present

## 2016-04-21 DIAGNOSIS — T451X5A Adverse effect of antineoplastic and immunosuppressive drugs, initial encounter: Secondary | ICD-10-CM

## 2016-04-21 MED ORDER — SODIUM CHLORIDE 0.9 % IV SOLN
Freq: Once | INTRAVENOUS | Status: AC
  Administered 2016-04-21: 11:00:00 via INTRAVENOUS

## 2016-04-21 MED ORDER — PROMETHAZINE HCL 25 MG/ML IJ SOLN
25.0000 mg | Freq: Once | INTRAMUSCULAR | Status: AC
  Administered 2016-04-21: 25 mg via INTRAVENOUS
  Filled 2016-04-21: qty 1

## 2016-04-21 MED ORDER — POLYETHYLENE GLYCOL 3350 17 G PO PACK: 17.0000 g | PACK | Freq: Every day | ORAL | 0 refills | Status: DC

## 2016-04-21 MED ORDER — SODIUM CHLORIDE 0.9% FLUSH
10.0000 mL | INTRAVENOUS | Status: DC | PRN
  Administered 2016-04-21: 10 mL
  Filled 2016-04-21: qty 10

## 2016-04-21 MED ORDER — AMOXICILLIN-POT CLAVULANATE 875-125 MG PO TABS: 1.0000 | ORAL_TABLET | Freq: Two times a day (BID) | ORAL | 0 refills | Status: DC

## 2016-04-21 MED ORDER — HEPARIN SOD (PORK) LOCK FLUSH 100 UNIT/ML IV SOLN
500.0000 [IU] | Freq: Once | INTRAVENOUS | Status: AC | PRN
  Administered 2016-04-21: 500 [IU]
  Filled 2016-04-21: qty 5

## 2016-04-21 MED ORDER — ONDANSETRON HCL 4 MG/2ML IJ SOLN
8.0000 mg | Freq: Once | INTRAMUSCULAR | Status: DC
Start: 2016-04-21 — End: 2016-04-21

## 2016-04-21 MED ORDER — LACTULOSE 10 GM/15ML PO SOLN: 10.0000 g | Freq: Three times a day (TID) | ORAL | 0 refills | Status: DC

## 2016-04-21 MED ORDER — SODIUM CHLORIDE 0.9 % IV SOLN: Freq: Once | INTRAVENOUS | Status: DC

## 2016-04-21 MED FILL — AMOX TR-K CLV 875-125 MG TA: 875-125 | 7 days supply | Qty: 14 | Fill #0

## 2016-04-21 MED FILL — ONDANSETRON HCL 8 MG TABLET: 8 | 10 days supply | Qty: 21 | Fill #1 | Status: TO

## 2016-04-21 MED FILL — POLYETHYLENE GLYCOL 3350: 15 days supply | Qty: 255 | Fill #0

## 2016-04-21 MED FILL — LACTULOSE 10 GM/15 ML SOLN: 10 | 5 days supply | Qty: 240 | Fill #0

## 2016-04-21 NOTE — Progress Notes (Signed)
[place] OFFICE PROGRESS NOTE  Patient Care Team: Haywood Pao, MD as PCP - General (Internal Medicine) Leota Sauers, RN as Oncology Nurse Navigator Eppie Gibson, MD as Attending Physician (Radiation Oncology) Heath Lark, MD as Consulting Physician (Hematology and Oncology) Karie Mainland, RD as Dietitian (Nutrition)  SUMMARY OF ONCOLOGIC HISTORY:   Tonsil cancer ([place])   02/07/2016 Imaging    Ct neck showed abnormal soft tissue within the left pharynx, extending from the left tonsillar pillar inferiorly to the level of the hyoid and filling the left vallecula. The appearance is most consistent with a neoplastic process. 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 Histologic sampling and direct visualization should be considered. Confluent adenopathy within the left cervical chain with areas of central hypoattenuation suggesting degrees of necrosis. This is most suggestive of metastatic spread. The areas of necrosis within the adenopathy combined with the above-described pharyngeal soft tissue mass are together suggestive of squamous cell carcinoma.      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 Borderline hypermetabolic small right station IIb lymph node. Several tiny lung nodules are observed, these are not hypermetabolic but are also below the threshold level for accurate characterization. Dedicated CT chest may be warranted to act as a baseline.      03/20/2016 Procedure    Placement of a right internal jugular approach power injectable Port-A-Cath. Fluoroscopic insertion of a 20-French pull-through gastrostomy tube.       03/24/2016 -  Chemotherapy    He received high dose cisplatin      03/24/2016 -  Radiation Therapy    He received concurrent radiation       INTERVAL HISTORY: Please see below for problem oriented charting. He is seen as part of his weekly follow-up. He has lost significant amount of weight. He has no bowel movement for 6 days. He started to have productive cough this morning. No fever or chills. No hemoptysis. He had nausea this morning.  REVIEW OF SYSTEMS:   Constitutional: Denies fevers, chills or abnormal weight loss Eyes: Denies blurriness of vision Ears, nose, mouth, throat, and face: Denies mucositis or sore throat Cardiovascular: Denies palpitation, chest discomfort or lower extremity swelling Skin: Denies abnormal skin rashes Lymphatics: Denies new lymphadenopathy or easy bruising Neurological:Denies numbness, tingling or new weaknesses Behavioral/Psych: Mood is stable, no new changes  All other systems were reviewed with the patient and are negative.  I have reviewed the past medical history, past surgical history, social history and family history with the patient and they are unchanged from previous note.  ALLERGIES:  has No Known Allergies.  MEDICATIONS:  Current Outpatient Prescriptions  Medication Sig Dispense Refill  . acetaminophen (TYLENOL) 650 MG CR tablet Take 1,300 mg by mouth.    [name] amLODipine (NORVASC) 10 MG tablet Take 1 tablet (10 mg total) by mouth daily. 30 tablet 11  . aspirin EC 81 MG tablet Take by mouth.    . fenofibrate (TRICOR) 145 MG tablet     . GLUCOSAMINE SULFATE PO Take by mouth.    . levothyroxine (SYNTHROID, LEVOTHROID) 50 MCG tablet     . lidocaine (  XYLOCAINE) 2 % solution Patient: Mix 1part 2% viscous lidocaine, 1part H20. Swish/swallow 16mL of this mixture, 27min before meals and at bedtime, up to QID 200 mL 3  . lidocaine-prilocaine (EMLA) cream Apply to affected area once 30 g 3  . morphine (ROXANOL) 20 MG/ML concentrated  solution Take 0.5 mLs (10 mg total) by mouth every 2 (two) hours as needed for severe pain. 120 mL 0  . Multiple Vitamin (MULTIVITAMIN) capsule Take by mouth.    . ondansetron (ZOFRAN) 8 MG tablet Take 1 tablet (8 mg total) by mouth 2 (two) times daily as needed. Start on the third day after chemotherapy. 30 tablet 1  . prochlorperazine (COMPAZINE) 10 MG tablet Take 1 tablet (10 mg total) by mouth every 6 (six) hours as needed (Nausea or vomiting). 30 tablet 1  . sodium fluoride (FLUORISHIELD) 1.1 % GEL dental gel Instill one drop of gel per tooth space of fluoride tray. Place over teeth for 5 minutes. Remove. Spit out excess. Repeat nightly. 120 mL prn  . sucralfate (CARAFATE) 1 g tablet Dissolve 1 tablet in 10 mL H20 and swallow QID prn sore throat. 60 tablet 3  . amoxicillin-clavulanate (AUGMENTIN) 875-125 MG tablet Take 1 tablet by mouth 2 (two) times daily. 14 tablet 0  . fentaNYL (DURAGESIC - DOSED MCG/HR) 25 MCG/HR patch Place 1 patch (25 mcg total) onto the skin every 3 (three) days. (Patient not taking: Reported on 04/21/2016) 5 patch 0  . HYDROcodone-acetaminophen (NORCO) 10-325 MG tablet TAKE 1 TABLET BY MOUTH EVERY 4-6 HOURS AS NEEDED FOR PAIN  0  . lactulose (CHRONULAC) 10 GM/15ML solution Place 15 mLs (10 g total) into feeding tube 3 (three) times daily. 240 mL 0  . polyethylene glycol (MIRALAX) packet Take 17 g by mouth daily. 14 each 0   No current facility-administered medications for this visit.     PHYSICAL EXAMINATION: ECOG PERFORMANCE STATUS: 1 - Symptomatic but completely ambulatory  Vitals:   04/21/16 0940  BP: 110/70  Pulse: 66  Resp: 19  Temp: 99.1 F (37.3 C)   Filed Weights   04/21/16 0940  Weight: 170 lb 14.4 oz (77.5 kg)    GENERAL:alert, no distress and comfortable SKIN: Noted mild skin irritation around his neck. No ulceration EYES: normal, Conjunctiva are pink and non-injected, sclera clear OROPHARYNX:No signs of mucositis. No thrush  NECK: supple,  thyroid normal size, non-tender, without nodularity LYMPH:  no palpable lymphadenopathy in the cervical, axillary or inguinal LUNGS: clear to auscultation and percussion with normal breathing effort HEART: regular rate & rhythm and no murmurs and no lower extremity edema ABDOMEN:abdomen soft, non-tender and normal bowel sounds Musculoskeletal:no cyanosis of digits and no clubbing  NEURO: alert & oriented x 3 with fluent speech, no focal motor/sensory deficits  LABORATORY DATA:  I have reviewed the data as listed    Component Value Date/Time   NA 137 04/11/2016 0845   K 4.3 04/11/2016 0845   CL 105 03/20/2016 1200   CO2 27 04/11/2016 0845   GLUCOSE 89 04/11/2016 0845   BUN 14.7 04/11/2016 0845   CREATININE 1.0 04/11/2016 0845   CALCIUM 10.0 04/11/2016 0845   PROT 6.9 04/11/2016 0845   ALBUMIN 3.5 04/11/2016 0845   AST 17 04/11/2016 0845   ALT 24 04/11/2016 0845   ALKPHOS 49 04/11/2016 0845   BILITOT 0.38 04/11/2016 0845   GFRNONAA >60 03/20/2016 1200   GFRAA >60 03/20/2016 1200    No results found for: SPEP, UPEP  Lab Results  Component Value Date   WBC 2.2 (L) 04/11/2016   NEUTROABS 1.3 (L) 04/11/2016   HGB 11.6 (L) 04/11/2016   HCT 34.3 (L) 04/11/2016   MCV 86.6 04/11/2016   PLT 389 04/11/2016      Chemistry      Component Value Date/Time   NA 137 04/11/2016 0845   K 4.3 04/11/2016 0845   CL 105 03/20/2016 1200   CO2 27 04/11/2016 0845   BUN 14.7 04/11/2016 0845   CREATININE 1.0 04/11/2016 0845      Component Value Date/Time   CALCIUM 10.0 04/11/2016 0845   ALKPHOS 49 04/11/2016 0845   AST 17 04/11/2016 0845   ALT 24 04/11/2016 0845   BILITOT 0.38 04/11/2016 0845       ASSESSMENT & PLAN:  Tonsil cancer ([place]) I plan to reduce the dose of chemotherapy in the future by 20% due to tinnitus He is experiencing worsening side effects from treatment. I will continue to see him on a weekly basis for supportive care  Cough He has significant coughing with  productive sputum. The patient was noted to be pancytopenic recently. He is at high risk for infection. I will proceed to start him on Augmentin and will reevaluate next week  Weight loss He has progressive weight loss. I encouraged him to increase intake through feeding tube. We will get him to follow closely with dietitian  Other constipation He is profoundly constipated, with no bowel movement for 6 days. I will start him on aggressive laxative therapy  Chemotherapy-induced nausea He has multifactorial nausea, complicated by recent chemotherapy, radiation therapy and constipation. I will continue to provide IV fluid hydration and IV antiemetics   No orders of the defined types were placed in this encounter.  All questions were answered. The patient knows to call the clinic with any problems, questions or concerns. No barriers to learning was detected. I spent 30 minutes counseling the patient face to face. The total time spent in the appointment was 40 minutes and more than 50% was on counseling and review of test results     Heath Lark, MD 04/21/2016 10:15 AM

## 2016-04-21 NOTE — Assessment & Plan Note (Signed)
He has progressive weight loss. I encouraged him to increase intake through feeding tube. We will get him to follow closely with dietitian

## 2016-04-21 NOTE — Progress Notes (Signed)
Todd [name] is here for his 19th fraction of radiation to his Left Tonsil and Bilateral neck. He reports pain when swallowing, but is not currently taking pain medicine. He reports thick saliva that is green/ yellow in color. He reports an occasional bloody nose. He has some nausea, and has been taking zofran twice daily. He did vomit this morning per his report. He has not been eating well, which he relates to feeling badly. He had been instilling 1 can of ensure until yesterday when he instilled 3 cans into his PEG. He reports drinking/ instiling about 3-4 sixteen ounce bottles of water daily. He is receiving IV Fluids daily this week with medical oncology for support. His neck is red, without peeling, and he is applying sonafine twice daily.   Temp 98.4 F (36.9 C)   Ht 5\' 10"  (1.778 m)   Wt 170 lb 9.6 oz (77.4 kg)   SpO2 97% Comment: room air  BMI 24.48 kg/m    Orthostatics: BP sitting 118/78, pulse 75. BP standing 112/75 pulse 77  Wt Readings from Last 3 Encounters:  04/21/16 170 lb 9.6 oz (77.4 kg)  04/14/16 174 lb 12.8 oz (79.3 kg)  04/14/16 175 lb 8 oz (79.6 kg)

## 2016-04-21 NOTE — Assessment & Plan Note (Signed)
He is profoundly constipated, with no bowel movement for 6 days. I will start him on aggressive laxative therapy

## 2016-04-21 NOTE — Assessment & Plan Note (Signed)
He has significant coughing with productive sputum. The patient was noted to be pancytopenic recently. He is at high risk for infection. I will proceed to start him on Augmentin and will reevaluate next week

## 2016-04-21 NOTE — Assessment & Plan Note (Signed)
He has multifactorial nausea, complicated by recent chemotherapy, radiation therapy and constipation. I will continue to provide IV fluid hydration and IV antiemetics

## 2016-04-21 NOTE — Addendum Note (Signed)
Encounter addended by: Eppie Gibson, MD on: 04/21/2016 11:29 AM<BR>    Actions taken: Follow-up modified, LOS modified

## 2016-04-21 NOTE — Progress Notes (Signed)
Pt took Zofran po at home prior to radiation this am. Will hold IV Zofran for now since he wanted to try one anti nausea at a time and will give Phenergan.

## 2016-04-21 NOTE — Progress Notes (Signed)
   Weekly Management Note:  Outpatient    ICD-9-CM ICD-10-CM   1. Carcinoma of tonsillar fossa (HCC) 146.1 C09.0     Current Dose: 38 Gy  Projected Dose: 70 Gy   Narrative:  The patient presents for routine under treatment assessment.  CBCT/MVCT images/Port film x-rays were reviewed.  The chart was checked.  Very nauseous today. Received chemotherapy a week ago. He vomited this AM.  Pain is well controlled.  Denies dizziness. Physical Findings:  Wt Readings from Last 3 Encounters:  04/21/16 170 lb 14.4 oz (77.5 kg)  04/21/16 170 lb 9.6 oz (77.4 kg)  04/14/16 174 lb 12.8 oz (79.3 kg)    height is 5\' 10"  (1.778 m) and weight is 170 lb 9.6 oz (77.4 kg). His temperature is 98.4 F (36.9 C). His oxygen saturation is 97%.  brisk gag reflex. ulcerative left tonsil tumor further improved.  Skin intact over neck, slightly pink.  Left upper neck mass is significantly less palpable.  CBC    Component Value Date/Time   WBC 2.2 (L) 04/11/2016 0845   WBC 6.2 03/20/2016 1200   RBC 3.96 (L) 04/11/2016 0845   RBC 4.16 (L) 03/20/2016 1200   HGB 11.6 (L) 04/11/2016 0845   HCT 34.3 (L) 04/11/2016 0845   PLT 389 04/11/2016 0845   MCV 86.6 04/11/2016 0845   MCH 29.3 04/11/2016 0845   MCH 30.0 03/20/2016 1200   MCHC 33.8 04/11/2016 0845   MCHC 35.5 03/20/2016 1200   RDW 12.8 04/11/2016 0845   LYMPHSABS 0.4 (L) 04/11/2016 0845   MONOABS 0.4 04/11/2016 0845   EOSABS 0.0 04/11/2016 0845   BASOSABS 0.0 04/11/2016 0845     CMP     Component Value Date/Time   NA 137 04/11/2016 0845   K 4.3 04/11/2016 0845   CL 105 03/20/2016 1200   CO2 27 04/11/2016 0845   GLUCOSE 89 04/11/2016 0845   BUN 14.7 04/11/2016 0845   CREATININE 1.0 04/11/2016 0845   CALCIUM 10.0 04/11/2016 0845   PROT 6.9 04/11/2016 0845   ALBUMIN 3.5 04/11/2016 0845   AST 17 04/11/2016 0845   ALT 24 04/11/2016 0845   ALKPHOS 49 04/11/2016 0845   BILITOT 0.38 04/11/2016 0845   GFRNONAA >60 03/20/2016 1200   GFRAA >60  03/20/2016 1200     Impression:  The patient is tolerating radiotherapy.   Plan:  Continue radiotherapy as planned.   Use sucralfate and lidocaine prn for esophagitis.  IV fluids today.   -----------------------------------  Eppie Gibson, MD

## 2016-04-21 NOTE — Progress Notes (Signed)
Nutrition follow-up completed with patient receiving IV fluids for tonsil cancer. Weight has decreased once again and was documented as 170.6 pounds decreased from 187 pounds November. Patient reports he has started to use his feeding tube. He feels badly today.  Patient was difficult to understand secondary to speaking in whispers.  Nutrition diagnosis: Inadequate oral intake continues.  Severe malnutrition related to tonsil cancer as evidenced by 9% weight loss in approximately 4 weeks and less than 75% of estimated energy intake for greater than 4 weeks.  Estimated nutrition needs: 2225-2425 calories, 95-115 grams protein, 2.3 L fluid.  Intervention: Educated patient to begin one can Osmolite 1.5 - 4 times a day with 60 cc free water before and after bolus feeding. Patient should increase Osmolite 1.5 to 1.5 cans 4 times a day if tolerated. This provides approximately 2130 cal 89 g protein, 1086 mL free water. Encouraged patient to continue to eat and drink what he is able. Patient is receiving IV fluids to help provide hydration. Patient is agreeable. Questions answered.  Teach back method was used.  Monitoring, evaluation, goals: Patient will tolerate tube feeding plus oral intake to provide greater than 90% of estimated needs.  Next visit: Tuesday, December 12, during IV fluids.  **Disclaimer: This note was dictated with voice recognition software. Similar sounding words can inadvertently be transcribed and this note may contain transcription errors which may not have been corrected upon publication of note.**

## 2016-04-21 NOTE — Patient Instructions (Signed)
Dehydration, Adult Dehydration is when there is not enough fluid or water in your body. This happens when you lose more fluids than you take in. Dehydration can range from mild to very bad. It should be treated right away to keep it from getting very bad. Symptoms of mild dehydration may include:   Thirst.  Dry lips.  Slightly dry mouth.  Dry, warm skin.  Dizziness. Symptoms of moderate dehydration may include:   Very dry mouth.  Muscle cramps.  Dark pee (urine). Pee may be the color of tea.  Your body making less pee.  Your eyes making fewer tears.  Heartbeat that is uneven or faster than normal (palpitations).  Headache.  Light-headedness, especially when you stand up from sitting.  Fainting (syncope). Symptoms of very bad dehydration may include:   Changes in skin, such as:  Cold and clammy skin.  Blotchy (mottled) or pale skin.  Skin that does not quickly return to normal after being lightly pinched and let go (poor skin turgor).  Changes in body fluids, such as:  Feeling very thirsty.  Your eyes making fewer tears.  Not sweating when body temperature is high, such as in hot weather.  Your body making very little pee.  Changes in vital signs, such as:  Weak pulse.  Pulse that is more than 100 beats a minute when you are sitting still.  Fast breathing.  Low blood pressure.  Other changes, such as:  Sunken eyes.  Cold hands and feet.  Confusion.  Lack of energy (lethargy).  Trouble waking up from sleep.  Short-term weight loss.  Unconsciousness. Follow these instructions at home:  If told by your doctor, drink an ORS:  Make an ORS by using instructions on the package.  Start by drinking small amounts, about  cup (120 mL) every 5-10 minutes.  Slowly drink more until you have had the amount that your doctor said to have.  Drink enough clear fluid to keep your pee clear or pale yellow. If you were told to drink an ORS, finish the  ORS first, then start slowly drinking clear fluids. Drink fluids such as:  Water. Do not drink only water by itself. Doing that can make the salt (sodium) level in your body get too low (hyponatremia).  Ice chips.  Fruit juice that you have added water to (diluted).  Low-calorie sports drinks.  Avoid:  Alcohol.  Drinks that have a lot of sugar. These include high-calorie sports drinks, fruit juice that does not have water added, and soda.  Caffeine.  Foods that are greasy or have a lot of fat or sugar.  Take over-the-counter and prescription medicines only as told by your doctor.  Do not take salt tablets. Doing that can make the salt level in your body get too high (hypernatremia).  Eat foods that have minerals (electrolytes). Examples include bananas, oranges, potatoes, tomatoes, and spinach.  Keep all follow-up visits as told by your doctor. This is important. Contact a doctor if:  You have belly (abdominal) pain that:  Gets worse.  Stays in one area (localizes).  You have a rash.  You have a stiff neck.  You get angry or annoyed more easily than normal (irritability).  You are more sleepy than normal.  You have a harder time waking up than normal.  You feel:  Weak.  Dizzy.  Very thirsty.  You have peed (urinated) only a small amount of very dark pee during 6-8 hours. Get help right away if:  You   have symptoms of very bad dehydration.  You cannot drink fluids without throwing up (vomiting).  Your symptoms get worse with treatment.  You have a fever.  You have a very bad headache.  You are throwing up or having watery poop (diarrhea) and it:  Gets worse.  Does not go away.  You have blood or something green (bile) in your throw-up.  You have blood in your poop (stool). This may cause poop to look black and tarry.  You have not peed in 6-8 hours.  You pass out (faint).  Your heart rate when you are sitting still is more than 100 beats a  minute.  You have trouble breathing. This information is not intended to replace advice given to you by your health care provider. Make sure you discuss any questions you have with your health care provider. Document Released: 02/22/2009 Document Revised: 11/16/2015 Document Reviewed: 06/22/2015 Elsevier Interactive Patient Education  2017 Elsevier Inc.  

## 2016-04-21 NOTE — Assessment & Plan Note (Signed)
I plan to reduce the dose of chemotherapy in the future by 20% due to tinnitus He is experiencing worsening side effects from treatment. I will continue to see him on a weekly basis for supportive care

## 2016-04-22 ENCOUNTER — Ambulatory Visit
Admission: RE | Admit: 2016-04-22 | Discharge: 2016-04-22 | Disposition: A | Discharge status: Home / Self Care | Payer: BLUE CROSS/BLUE SHIELD | Source: Ambulatory Visit | Attending: Radiation Oncology | Admitting: Radiation Oncology

## 2016-04-22 ENCOUNTER — Ambulatory Visit: Payer: BLUE CROSS/BLUE SHIELD

## 2016-04-22 ENCOUNTER — Telehealth: Payer: Self-pay | Admitting: *Deleted

## 2016-04-22 ENCOUNTER — Ambulatory Visit (HOSPITAL_BASED_OUTPATIENT_CLINIC_OR_DEPARTMENT_OTHER): Payer: BLUE CROSS/BLUE SHIELD

## 2016-04-22 ENCOUNTER — Other Ambulatory Visit: Payer: Self-pay

## 2016-04-22 VITALS — BP 121/68 | HR 68 | Temp 99.3°F | Resp 18

## 2016-04-22 DIAGNOSIS — C090 Malignant neoplasm of tonsillar fossa: Secondary | ICD-10-CM | POA: Diagnosis not present

## 2016-04-22 DIAGNOSIS — C099 Malignant neoplasm of tonsil, unspecified: Secondary | ICD-10-CM | POA: Diagnosis not present

## 2016-04-22 DIAGNOSIS — Z7982 Long term (current) use of aspirin: Secondary | ICD-10-CM | POA: Diagnosis not present

## 2016-04-22 DIAGNOSIS — Z79899 Other long term (current) drug therapy: Secondary | ICD-10-CM | POA: Diagnosis not present

## 2016-04-22 DIAGNOSIS — Z87891 Personal history of nicotine dependence: Secondary | ICD-10-CM | POA: Diagnosis not present

## 2016-04-22 DIAGNOSIS — E785 Hyperlipidemia, unspecified: Secondary | ICD-10-CM | POA: Diagnosis not present

## 2016-04-22 DIAGNOSIS — R1310 Dysphagia, unspecified: Secondary | ICD-10-CM | POA: Diagnosis not present

## 2016-04-22 DIAGNOSIS — Z510 Encounter for antineoplastic radiation therapy: Secondary | ICD-10-CM | POA: Diagnosis not present

## 2016-04-22 DIAGNOSIS — E860 Dehydration: Secondary | ICD-10-CM | POA: Diagnosis not present

## 2016-04-22 DIAGNOSIS — M1990 Unspecified osteoarthritis, unspecified site: Secondary | ICD-10-CM | POA: Diagnosis not present

## 2016-04-22 DIAGNOSIS — I10 Essential (primary) hypertension: Secondary | ICD-10-CM | POA: Diagnosis not present

## 2016-04-22 DIAGNOSIS — E039 Hypothyroidism, unspecified: Secondary | ICD-10-CM | POA: Diagnosis not present

## 2016-04-22 DIAGNOSIS — Z809 Family history of malignant neoplasm, unspecified: Secondary | ICD-10-CM | POA: Diagnosis not present

## 2016-04-22 MED ORDER — SODIUM CHLORIDE 0.9 % IV SOLN: Freq: Once | INTRAVENOUS | Status: DC

## 2016-04-22 MED ORDER — ONDANSETRON HCL 4 MG/2ML IJ SOLN
INTRAMUSCULAR | Status: AC
  Filled 2016-04-22: qty 4

## 2016-04-22 MED ORDER — SODIUM CHLORIDE 0.9 % IV SOLN
Freq: Once | INTRAVENOUS | Status: AC
  Administered 2016-04-22: 09:00:00 via INTRAVENOUS

## 2016-04-22 MED ORDER — OSMOLITE 1.5 CAL PO LIQD: ORAL | 0 refills | Status: DC

## 2016-04-22 MED ORDER — HEPARIN SOD (PORK) LOCK FLUSH 100 UNIT/ML IV SOLN
500.0000 [IU] | Freq: Once | INTRAVENOUS | Status: AC | PRN
  Administered 2016-04-22: 500 [IU]
  Filled 2016-04-22: qty 5

## 2016-04-22 MED ORDER — SODIUM CHLORIDE 0.9% FLUSH
10.0000 mL | INTRAVENOUS | Status: DC | PRN
  Administered 2016-04-22: 10 mL
  Filled 2016-04-22: qty 10

## 2016-04-22 MED ORDER — ONDANSETRON HCL 4 MG/2ML IJ SOLN
8.0000 mg | Freq: Once | INTRAMUSCULAR | Status: AC
  Administered 2016-04-22: 8 mg via INTRAVENOUS

## 2016-04-22 NOTE — Progress Notes (Addendum)
Nutrition Follow-up completed with patient receiving IV fluids for tonsil cancer.  Patient more alert this am, no family with patient during visit.   Patient reports that he has started to use his tube feeding, none given this am. Reports gave a can yesterday of osmolite 1.5 without problems.  Reports has been giving medication through tube without problems.   Nutrition diagnosis: Inadequate oral intake continues.  Severe malnutrition related to tonsil cancer as evidenced by 9% weight loss in approximately 4 weeks and less than 75% of estimated energy intake for greater than 4 weeks.    Estimated nutrition needs: 2225-2425 calories, 95-115 g protein, 2.3 L fluid.  Intervention: Patient shook head in agreement for goal today would be for patient to provide 4 cans of osmolite 1.5 with 25ml of free water before and after giving formula.  Discussed overall goal would be for patient to tolerate 1.5 cans of osmolite 1.5 4 times per day (provides 2130 calories, 89 g of protein and 1549ml free water (includes water from formula and current water flush).  Patient given handwritten tube feeding regimen along with instructions for bolus feeding.  Reviewed instructions for bolus feeding and patient able to verbalize instructions. Reports that he gave can of formula via gravity and stood up during the process.  Discussed with patient that he can also sit in chair as well.  Teach back method used.  Patient reports that he is still able to take in oral nutrition via mouth.  Will need to adjust water flushes pending patient intake.   Patient agreeable to having [facility] deliver tube feeding supplies.  Will provide tube feeding order for MD to sign and touch base with [facility] representative.  Patient did not have questions at this time.    Monitoring, evaluation, goals: Patient will tolerate tube feeding plus oral intake to provide greater than 90% of estimated needs.  Next visit: as  needd  Nashiya Disbrow B. Zenia Resides, [place], [place] (pager)

## 2016-04-22 NOTE — Telephone Encounter (Signed)
[email or web address]

## 2016-04-22 NOTE — Patient Instructions (Signed)
Dehydration, Adult Dehydration is when there is not enough fluid or water in your body. This happens when you lose more fluids than you take in. Dehydration can range from mild to very bad. It should be treated right away to keep it from getting very bad. Symptoms of mild dehydration may include:   Thirst.  Dry lips.  Slightly dry mouth.  Dry, warm skin.  Dizziness. Symptoms of moderate dehydration may include:   Very dry mouth.  Muscle cramps.  Dark pee (urine). Pee may be the color of tea.  Your body making less pee.  Your eyes making fewer tears.  Heartbeat that is uneven or faster than normal (palpitations).  Headache.  Light-headedness, especially when you stand up from sitting.  Fainting (syncope). Symptoms of very bad dehydration may include:   Changes in skin, such as:  Cold and clammy skin.  Blotchy (mottled) or pale skin.  Skin that does not quickly return to normal after being lightly pinched and let go (poor skin turgor).  Changes in body fluids, such as:  Feeling very thirsty.  Your eyes making fewer tears.  Not sweating when body temperature is high, such as in hot weather.  Your body making very little pee.  Changes in vital signs, such as:  Weak pulse.  Pulse that is more than 100 beats a minute when you are sitting still.  Fast breathing.  Low blood pressure.  Other changes, such as:  Sunken eyes.  Cold hands and feet.  Confusion.  Lack of energy (lethargy).  Trouble waking up from sleep.  Short-term weight loss.  Unconsciousness. Follow these instructions at home:  If told by your doctor, drink an ORS:  Make an ORS by using instructions on the package.  Start by drinking small amounts, about  cup (120 mL) every 5-10 minutes.  Slowly drink more until you have had the amount that your doctor said to have.  Drink enough clear fluid to keep your pee clear or pale yellow. If you were told to drink an ORS, finish the  ORS first, then start slowly drinking clear fluids. Drink fluids such as:  Water. Do not drink only water by itself. Doing that can make the salt (sodium) level in your body get too low (hyponatremia).  Ice chips.  Fruit juice that you have added water to (diluted).  Low-calorie sports drinks.  Avoid:  Alcohol.  Drinks that have a lot of sugar. These include high-calorie sports drinks, fruit juice that does not have water added, and soda.  Caffeine.  Foods that are greasy or have a lot of fat or sugar.  Take over-the-counter and prescription medicines only as told by your doctor.  Do not take salt tablets. Doing that can make the salt level in your body get too high (hypernatremia).  Eat foods that have minerals (electrolytes). Examples include bananas, oranges, potatoes, tomatoes, and spinach.  Keep all follow-up visits as told by your doctor. This is important. Contact a doctor if:  You have belly (abdominal) pain that:  Gets worse.  Stays in one area (localizes).  You have a rash.  You have a stiff neck.  You get angry or annoyed more easily than normal (irritability).  You are more sleepy than normal.  You have a harder time waking up than normal.  You feel:  Weak.  Dizzy.  Very thirsty.  You have peed (urinated) only a small amount of very dark pee during 6-8 hours. Get help right away if:  You   have symptoms of very bad dehydration.  You cannot drink fluids without throwing up (vomiting).  Your symptoms get worse with treatment.  You have a fever.  You have a very bad headache.  You are throwing up or having watery poop (diarrhea) and it:  Gets worse.  Does not go away.  You have blood or something green (bile) in your throw-up.  You have blood in your poop (stool). This may cause poop to look black and tarry.  You have not peed in 6-8 hours.  You pass out (faint).  Your heart rate when you are sitting still is more than 100 beats a  minute.  You have trouble breathing. This information is not intended to replace advice given to you by your health care provider. Make sure you discuss any questions you have with your health care provider. Document Released: 02/22/2009 Document Revised: 11/16/2015 Document Reviewed: 06/22/2015 Elsevier Interactive Patient Education  2017 Elsevier Inc.  

## 2016-04-23 ENCOUNTER — Encounter: Payer: Self-pay | Admitting: *Deleted

## 2016-04-23 ENCOUNTER — Ambulatory Visit (HOSPITAL_BASED_OUTPATIENT_CLINIC_OR_DEPARTMENT_OTHER): Payer: BLUE CROSS/BLUE SHIELD

## 2016-04-23 ENCOUNTER — Ambulatory Visit
Admission: RE | Admit: 2016-04-23 | Discharge: 2016-04-23 | Disposition: A | Discharge status: Home / Self Care | Payer: BLUE CROSS/BLUE SHIELD | Source: Ambulatory Visit | Attending: Radiation Oncology | Admitting: Radiation Oncology

## 2016-04-23 VITALS — BP 126/74 | HR 55 | Temp 98.6°F | Resp 16

## 2016-04-23 DIAGNOSIS — C099 Malignant neoplasm of tonsil, unspecified: Secondary | ICD-10-CM | POA: Diagnosis not present

## 2016-04-23 DIAGNOSIS — C801 Malignant (primary) neoplasm, unspecified: Secondary | ICD-10-CM | POA: Diagnosis not present

## 2016-04-23 DIAGNOSIS — E860 Dehydration: Secondary | ICD-10-CM | POA: Diagnosis not present

## 2016-04-23 DIAGNOSIS — M1990 Unspecified osteoarthritis, unspecified site: Secondary | ICD-10-CM | POA: Diagnosis not present

## 2016-04-23 DIAGNOSIS — Z7982 Long term (current) use of aspirin: Secondary | ICD-10-CM | POA: Diagnosis not present

## 2016-04-23 DIAGNOSIS — Z79899 Other long term (current) drug therapy: Secondary | ICD-10-CM | POA: Diagnosis not present

## 2016-04-23 DIAGNOSIS — C090 Malignant neoplasm of tonsillar fossa: Secondary | ICD-10-CM | POA: Diagnosis not present

## 2016-04-23 DIAGNOSIS — Z809 Family history of malignant neoplasm, unspecified: Secondary | ICD-10-CM | POA: Diagnosis not present

## 2016-04-23 DIAGNOSIS — E039 Hypothyroidism, unspecified: Secondary | ICD-10-CM | POA: Diagnosis not present

## 2016-04-23 DIAGNOSIS — Z87891 Personal history of nicotine dependence: Secondary | ICD-10-CM | POA: Diagnosis not present

## 2016-04-23 DIAGNOSIS — Z510 Encounter for antineoplastic radiation therapy: Secondary | ICD-10-CM | POA: Diagnosis not present

## 2016-04-23 DIAGNOSIS — R1310 Dysphagia, unspecified: Secondary | ICD-10-CM | POA: Diagnosis not present

## 2016-04-23 DIAGNOSIS — C029 Malignant neoplasm of tongue, unspecified: Secondary | ICD-10-CM | POA: Diagnosis not present

## 2016-04-23 DIAGNOSIS — I10 Essential (primary) hypertension: Secondary | ICD-10-CM | POA: Diagnosis not present

## 2016-04-23 DIAGNOSIS — E785 Hyperlipidemia, unspecified: Secondary | ICD-10-CM | POA: Diagnosis not present

## 2016-04-23 MED ORDER — SODIUM CHLORIDE 0.9 % IV SOLN
Freq: Once | INTRAVENOUS | Status: AC
  Administered 2016-04-23: 09:00:00 via INTRAVENOUS

## 2016-04-23 MED ORDER — SODIUM CHLORIDE 0.9 % IV SOLN: Freq: Once | INTRAVENOUS | Status: DC

## 2016-04-23 MED ORDER — HEPARIN SOD (PORK) LOCK FLUSH 100 UNIT/ML IV SOLN
500.0000 [IU] | Freq: Once | INTRAVENOUS | Status: AC | PRN
  Administered 2016-04-23: 500 [IU]
  Filled 2016-04-23: qty 5

## 2016-04-23 MED ORDER — ONDANSETRON HCL 4 MG/2ML IJ SOLN
INTRAMUSCULAR | Status: AC
  Filled 2016-04-23: qty 4

## 2016-04-23 MED ORDER — ONDANSETRON HCL 4 MG/2ML IJ SOLN
8.0000 mg | Freq: Once | INTRAMUSCULAR | Status: AC
  Administered 2016-04-23: 8 mg via INTRAVENOUS

## 2016-04-23 MED ORDER — SODIUM CHLORIDE 0.9% FLUSH
10.0000 mL | INTRAVENOUS | Status: DC | PRN
  Administered 2016-04-23: 10 mL
  Filled 2016-04-23: qty 10

## 2016-04-23 NOTE — Progress Notes (Signed)
Oncology Nurse Navigator Documentation  To provide support, encouragement and care continuity, met with Mr. Eckert in Infusion where he was completing scheduled IVF. He reported:  "Doing better today since sinuses are clearing up".  Increasing fatigue.  Difficulty managing thickened saliva.  I encouraged rinses with salt water/baking soda, diet ginger ale. He denied needs/concerns, understands he can contact me.  Gayleen Orem, RN, BSN, [place] [facility] Oncology Nurse [place] [facility] [place] [phone]

## 2016-04-23 NOTE — Patient Instructions (Signed)

## 2016-04-23 NOTE — Progress Notes (Signed)
Brief Nutrition Follow-up:  Nutrition Follow-up completed with patient receiving IV fluids for tonsil cancer.   Patient more alert this am, feeling better. Reports feels that antibiotics are working.   Reports took 3 cans of tube feeding yesterday without any problems. Plan is to try and take 4-5 cans today.  Patient reports that he is taking 69ml of water when giving tube feeding.    Estimated energy needs: 2225-2425 calories, 95-110 g protein, 2.3 L fluid.  Intervention: Patient to continue to increase tube feeding to 1.5 cans 4 times per day with 79ml of water before and after each feeding. Discussed with patient that he will need additional water of 257ml 3 times per day either via mouth or tube. This will provide 2264ml free water.  Patient verbalized understanding. Report he does not have a problem with tube feeding or feeding tube.    Monitoring, evaluation, goals: Patient will tolerate tube feeding plus oral intake to provide greater than 90% of estimated needs.  Next visit: Monday, December 18 at 9:45 following MD appointment Patient agreed to this visit.  Ellorie Kindall B. Zenia Resides, [place], [place] (pager)

## 2016-04-24 ENCOUNTER — Ambulatory Visit (HOSPITAL_BASED_OUTPATIENT_CLINIC_OR_DEPARTMENT_OTHER): Payer: BLUE CROSS/BLUE SHIELD

## 2016-04-24 ENCOUNTER — Ambulatory Visit
Admission: RE | Admit: 2016-04-24 | Discharge: 2016-04-24 | Disposition: A | Discharge status: Home / Self Care | Payer: BLUE CROSS/BLUE SHIELD | Source: Ambulatory Visit | Attending: Radiation Oncology | Admitting: Radiation Oncology

## 2016-04-24 VITALS — BP 136/82 | HR 60 | Temp 98.6°F | Resp 16

## 2016-04-24 DIAGNOSIS — E785 Hyperlipidemia, unspecified: Secondary | ICD-10-CM | POA: Diagnosis not present

## 2016-04-24 DIAGNOSIS — R1310 Dysphagia, unspecified: Secondary | ICD-10-CM | POA: Diagnosis not present

## 2016-04-24 DIAGNOSIS — M1990 Unspecified osteoarthritis, unspecified site: Secondary | ICD-10-CM | POA: Diagnosis not present

## 2016-04-24 DIAGNOSIS — C090 Malignant neoplasm of tonsillar fossa: Secondary | ICD-10-CM | POA: Diagnosis not present

## 2016-04-24 DIAGNOSIS — Z87891 Personal history of nicotine dependence: Secondary | ICD-10-CM | POA: Diagnosis not present

## 2016-04-24 DIAGNOSIS — E039 Hypothyroidism, unspecified: Secondary | ICD-10-CM | POA: Diagnosis not present

## 2016-04-24 DIAGNOSIS — C099 Malignant neoplasm of tonsil, unspecified: Secondary | ICD-10-CM | POA: Diagnosis not present

## 2016-04-24 DIAGNOSIS — Z809 Family history of malignant neoplasm, unspecified: Secondary | ICD-10-CM | POA: Diagnosis not present

## 2016-04-24 DIAGNOSIS — I10 Essential (primary) hypertension: Secondary | ICD-10-CM | POA: Diagnosis not present

## 2016-04-24 DIAGNOSIS — Z510 Encounter for antineoplastic radiation therapy: Secondary | ICD-10-CM | POA: Diagnosis not present

## 2016-04-24 DIAGNOSIS — E860 Dehydration: Secondary | ICD-10-CM | POA: Diagnosis not present

## 2016-04-24 DIAGNOSIS — Z79899 Other long term (current) drug therapy: Secondary | ICD-10-CM | POA: Diagnosis not present

## 2016-04-24 DIAGNOSIS — Z7982 Long term (current) use of aspirin: Secondary | ICD-10-CM | POA: Diagnosis not present

## 2016-04-24 MED ORDER — SODIUM CHLORIDE 0.9% FLUSH
10.0000 mL | INTRAVENOUS | Status: DC | PRN
  Administered 2016-04-24: 10 mL
  Filled 2016-04-24: qty 10

## 2016-04-24 MED ORDER — SODIUM CHLORIDE 0.9 % IV SOLN
1000.0000 mL | Freq: Once | INTRAVENOUS | Status: AC
  Administered 2016-04-24: 1000 mL via INTRAVENOUS

## 2016-04-24 MED ORDER — HEPARIN SOD (PORK) LOCK FLUSH 100 UNIT/ML IV SOLN
500.0000 [IU] | Freq: Once | INTRAVENOUS | Status: AC | PRN
  Administered 2016-04-24: 500 [IU]
  Filled 2016-04-24: qty 5

## 2016-04-24 NOTE — Patient Instructions (Signed)
Dehydration, Adult Dehydration is a condition in which there is not enough fluid or water in the body. This happens when you lose more fluids than you take in. Important organs, such as the kidneys, brain, and heart, cannot function without a proper amount of fluids. Any loss of fluids from the body can lead to dehydration. Dehydration can range from mild to severe. This condition should be treated right away to prevent it from becoming severe. What are the causes? This condition may be caused by:  Vomiting.  Diarrhea.  Excessive sweating, such as from heat exposure or exercise.  Not drinking enough fluid, especially:  When ill.  While doing activity that requires a lot of energy.  Excessive urination.  Fever.  Infection.  Certain medicines, such as medicines that cause the body to lose excess fluid (diuretics).  Inability to access safe drinking water.  Reduced physical ability to get adequate water and food. What increases the risk? This condition is more likely to develop in people:  Who have a poorly controlled long-term (chronic) illness, such as diabetes, heart disease, or kidney disease.  Who are age 65 or older.  Who are disabled.  Who live in a place with high altitude.  Who play endurance sports. What are the signs or symptoms? Symptoms of mild dehydration may include:   Thirst.  Dry lips.  Slightly dry mouth.  Dry, warm skin.  Dizziness. Symptoms of moderate dehydration may include:   Very dry mouth.  Muscle cramps.  Dark urine. Urine may be the color of tea.  Decreased urine production.  Decreased tear production.  Heartbeat that is irregular or faster than normal (palpitations).  Headache.  Light-headedness, especially when you stand up from a sitting position.  Fainting (syncope). Symptoms of severe dehydration may include:   Changes in skin, such as:  Cold and clammy skin.  Blotchy (mottled) or pale skin.  Skin that does  not quickly return to normal after being lightly pinched and released (poor skin turgor).  Changes in body fluids, such as:  Extreme thirst.  No tear production.  Inability to sweat when body temperature is high, such as in hot weather.  Very little urine production.  Changes in vital signs, such as:  Weak pulse.  Pulse that is more than 100 beats a minute when sitting still.  Rapid breathing.  Low blood pressure.  Other changes, such as:  Sunken eyes.  Cold hands and feet.  Confusion.  Lack of energy (lethargy).  Difficulty waking up from sleep.  Short-term weight loss.  Unconsciousness. How is this diagnosed? This condition is diagnosed based on your symptoms and a physical exam. Blood and urine tests may be done to help confirm the diagnosis. How is this treated? Treatment for this condition depends on the severity. Mild or moderate dehydration can often be treated at home. Treatment should be started right away. Do not wait until dehydration becomes severe. Severe dehydration is an emergency and it needs to be treated in a hospital. Treatment for mild dehydration may include:   Drinking more fluids.  Replacing salts and minerals in your blood (electrolytes) that you may have lost. Treatment for moderate dehydration may include:   Drinking an oral rehydration solution (ORS). This is a drink that helps you replace fluids and electrolytes (rehydrate). It can be found at pharmacies and retail stores. Treatment for severe dehydration may include:   Receiving fluids through an IV tube.  Receiving an electrolyte solution through a feeding tube that is   passed through your nose and into your stomach (nasogastric tube, or NG tube).  Correcting any abnormalities in electrolytes.  Treating the underlying cause of dehydration. Follow these instructions at home:  If directed by your health care provider, drink an ORS:  Make an ORS by following instructions on the  package.  Start by drinking small amounts, about  cup (120 mL) every 5-10 minutes.  Slowly increase how much you drink until you have taken the amount recommended by your health care provider.  Drink enough clear fluid to keep your urine clear or pale yellow. If you were told to drink an ORS, finish the ORS first, then start slowly drinking other clear fluids. Drink fluids such as:  Water. Do not drink only water. Doing that can lead to having too little salt (sodium) in the body (hyponatremia).  Ice chips.  Fruit juice that you have added water to (diluted fruit juice).  Low-calorie sports drinks.  Avoid:  Alcohol.  Drinks that contain a lot of sugar. These include high-calorie sports drinks, fruit juice that is not diluted, and soda.  Caffeine.  Foods that are greasy or contain a lot of fat or sugar.  Take over-the-counter and prescription medicines only as told by your health care provider.  Do not take sodium tablets. This can lead to having too much sodium in the body (hypernatremia).  Eat foods that contain a healthy balance of electrolytes, such as bananas, oranges, potatoes, tomatoes, and spinach.  Keep all follow-up visits as told by your health care provider. This is important. Contact a health care provider if:  You have abdominal pain that:  Gets worse.  Stays in one area (localizes).  You have a rash.  You have a stiff neck.  You are more irritable than usual.  You are sleepier or more difficult to wake up than usual.  You feel weak or dizzy.  You feel very thirsty.  You have urinated only a small amount of very dark urine over 6-8 hours. Get help right away if:  You have symptoms of severe dehydration.  You cannot drink fluids without vomiting.  Your symptoms get worse with treatment.  You have a fever.  You have a severe headache.  You have vomiting or diarrhea that:  Gets worse.  Does not go away.  You have blood or green matter  (bile) in your vomit.  You have blood in your stool. This may cause stool to look black and tarry.  You have not urinated in 6-8 hours.  You faint.  Your heart rate while sitting still is over 100 beats a minute.  You have trouble breathing. This information is not intended to replace advice given to you by your health care provider. Make sure you discuss any questions you have with your health care provider. Document Released: 04/28/2005 Document Revised: 11/23/2015 Document Reviewed: 06/22/2015 Elsevier Interactive Patient Education  2017 Elsevier Inc.  

## 2016-04-25 ENCOUNTER — Ambulatory Visit (HOSPITAL_BASED_OUTPATIENT_CLINIC_OR_DEPARTMENT_OTHER): Payer: BLUE CROSS/BLUE SHIELD

## 2016-04-25 ENCOUNTER — Ambulatory Visit
Admission: RE | Admit: 2016-04-25 | Discharge: 2016-04-25 | Disposition: A | Discharge status: Home / Self Care | Payer: BLUE CROSS/BLUE SHIELD | Source: Ambulatory Visit | Attending: Radiation Oncology | Admitting: Radiation Oncology

## 2016-04-25 VITALS — BP 117/79 | HR 68 | Temp 98.9°F | Resp 16

## 2016-04-25 DIAGNOSIS — C099 Malignant neoplasm of tonsil, unspecified: Secondary | ICD-10-CM

## 2016-04-25 DIAGNOSIS — E039 Hypothyroidism, unspecified: Secondary | ICD-10-CM | POA: Diagnosis not present

## 2016-04-25 DIAGNOSIS — Z510 Encounter for antineoplastic radiation therapy: Secondary | ICD-10-CM | POA: Diagnosis not present

## 2016-04-25 DIAGNOSIS — Z79899 Other long term (current) drug therapy: Secondary | ICD-10-CM | POA: Diagnosis not present

## 2016-04-25 DIAGNOSIS — E860 Dehydration: Secondary | ICD-10-CM | POA: Diagnosis not present

## 2016-04-25 DIAGNOSIS — I10 Essential (primary) hypertension: Secondary | ICD-10-CM | POA: Diagnosis not present

## 2016-04-25 DIAGNOSIS — R1310 Dysphagia, unspecified: Secondary | ICD-10-CM | POA: Diagnosis not present

## 2016-04-25 DIAGNOSIS — M1990 Unspecified osteoarthritis, unspecified site: Secondary | ICD-10-CM | POA: Diagnosis not present

## 2016-04-25 DIAGNOSIS — E785 Hyperlipidemia, unspecified: Secondary | ICD-10-CM | POA: Diagnosis not present

## 2016-04-25 DIAGNOSIS — C090 Malignant neoplasm of tonsillar fossa: Secondary | ICD-10-CM | POA: Diagnosis not present

## 2016-04-25 DIAGNOSIS — Z87891 Personal history of nicotine dependence: Secondary | ICD-10-CM | POA: Diagnosis not present

## 2016-04-25 DIAGNOSIS — Z809 Family history of malignant neoplasm, unspecified: Secondary | ICD-10-CM | POA: Diagnosis not present

## 2016-04-25 DIAGNOSIS — Z7982 Long term (current) use of aspirin: Secondary | ICD-10-CM | POA: Diagnosis not present

## 2016-04-25 MED ORDER — SODIUM CHLORIDE 0.9% FLUSH
10.0000 mL | INTRAVENOUS | Status: DC | PRN
  Administered 2016-04-25: 10 mL
  Filled 2016-04-25: qty 10

## 2016-04-25 MED ORDER — SODIUM CHLORIDE 0.9 % IV SOLN
Freq: Once | INTRAVENOUS | Status: AC
  Administered 2016-04-25: 09:00:00 via INTRAVENOUS

## 2016-04-25 MED ORDER — HEPARIN SOD (PORK) LOCK FLUSH 100 UNIT/ML IV SOLN
250.0000 [IU] | Freq: Once | INTRAVENOUS | Status: AC | PRN
  Administered 2016-04-25: 250 [IU]
  Filled 2016-04-25: qty 5

## 2016-04-25 NOTE — Patient Instructions (Signed)
Dehydration, Adult Dehydration is a condition in which there is not enough fluid or water in the body. This happens when you lose more fluids than you take in. Important organs, such as the kidneys, brain, and heart, cannot function without a proper amount of fluids. Any loss of fluids from the body can lead to dehydration. Dehydration can range from mild to severe. This condition should be treated right away to prevent it from becoming severe. What are the causes? This condition may be caused by:  Vomiting.  Diarrhea.  Excessive sweating, such as from heat exposure or exercise.  Not drinking enough fluid, especially:  When ill.  While doing activity that requires a lot of energy.  Excessive urination.  Fever.  Infection.  Certain medicines, such as medicines that cause the body to lose excess fluid (diuretics).  Inability to access safe drinking water.  Reduced physical ability to get adequate water and food. What increases the risk? This condition is more likely to develop in people:  Who have a poorly controlled long-term (chronic) illness, such as diabetes, heart disease, or kidney disease.  Who are age 65 or older.  Who are disabled.  Who live in a place with high altitude.  Who play endurance sports. What are the signs or symptoms? Symptoms of mild dehydration may include:   Thirst.  Dry lips.  Slightly dry mouth.  Dry, warm skin.  Dizziness. Symptoms of moderate dehydration may include:   Very dry mouth.  Muscle cramps.  Dark urine. Urine may be the color of tea.  Decreased urine production.  Decreased tear production.  Heartbeat that is irregular or faster than normal (palpitations).  Headache.  Light-headedness, especially when you stand up from a sitting position.  Fainting (syncope). Symptoms of severe dehydration may include:   Changes in skin, such as:  Cold and clammy skin.  Blotchy (mottled) or pale skin.  Skin that does  not quickly return to normal after being lightly pinched and released (poor skin turgor).  Changes in body fluids, such as:  Extreme thirst.  No tear production.  Inability to sweat when body temperature is high, such as in hot weather.  Very little urine production.  Changes in vital signs, such as:  Weak pulse.  Pulse that is more than 100 beats a minute when sitting still.  Rapid breathing.  Low blood pressure.  Other changes, such as:  Sunken eyes.  Cold hands and feet.  Confusion.  Lack of energy (lethargy).  Difficulty waking up from sleep.  Short-term weight loss.  Unconsciousness. How is this diagnosed? This condition is diagnosed based on your symptoms and a physical exam. Blood and urine tests may be done to help confirm the diagnosis. How is this treated? Treatment for this condition depends on the severity. Mild or moderate dehydration can often be treated at home. Treatment should be started right away. Do not wait until dehydration becomes severe. Severe dehydration is an emergency and it needs to be treated in a hospital. Treatment for mild dehydration may include:   Drinking more fluids.  Replacing salts and minerals in your blood (electrolytes) that you may have lost. Treatment for moderate dehydration may include:   Drinking an oral rehydration solution (ORS). This is a drink that helps you replace fluids and electrolytes (rehydrate). It can be found at pharmacies and retail stores. Treatment for severe dehydration may include:   Receiving fluids through an IV tube.  Receiving an electrolyte solution through a feeding tube that is   passed through your nose and into your stomach (nasogastric tube, or NG tube).  Correcting any abnormalities in electrolytes.  Treating the underlying cause of dehydration. Follow these instructions at home:  If directed by your health care provider, drink an ORS:  Make an ORS by following instructions on the  package.  Start by drinking small amounts, about  cup (120 mL) every 5-10 minutes.  Slowly increase how much you drink until you have taken the amount recommended by your health care provider.  Drink enough clear fluid to keep your urine clear or pale yellow. If you were told to drink an ORS, finish the ORS first, then start slowly drinking other clear fluids. Drink fluids such as:  Water. Do not drink only water. Doing that can lead to having too little salt (sodium) in the body (hyponatremia).  Ice chips.  Fruit juice that you have added water to (diluted fruit juice).  Low-calorie sports drinks.  Avoid:  Alcohol.  Drinks that contain a lot of sugar. These include high-calorie sports drinks, fruit juice that is not diluted, and soda.  Caffeine.  Foods that are greasy or contain a lot of fat or sugar.  Take over-the-counter and prescription medicines only as told by your health care provider.  Do not take sodium tablets. This can lead to having too much sodium in the body (hypernatremia).  Eat foods that contain a healthy balance of electrolytes, such as bananas, oranges, potatoes, tomatoes, and spinach.  Keep all follow-up visits as told by your health care provider. This is important. Contact a health care provider if:  You have abdominal pain that:  Gets worse.  Stays in one area (localizes).  You have a rash.  You have a stiff neck.  You are more irritable than usual.  You are sleepier or more difficult to wake up than usual.  You feel weak or dizzy.  You feel very thirsty.  You have urinated only a small amount of very dark urine over 6-8 hours. Get help right away if:  You have symptoms of severe dehydration.  You cannot drink fluids without vomiting.  Your symptoms get worse with treatment.  You have a fever.  You have a severe headache.  You have vomiting or diarrhea that:  Gets worse.  Does not go away.  You have blood or green matter  (bile) in your vomit.  You have blood in your stool. This may cause stool to look black and tarry.  You have not urinated in 6-8 hours.  You faint.  Your heart rate while sitting still is over 100 beats a minute.  You have trouble breathing. This information is not intended to replace advice given to you by your health care provider. Make sure you discuss any questions you have with your health care provider. Document Released: 04/28/2005 Document Revised: 11/23/2015 Document Reviewed: 06/22/2015 Elsevier Interactive Patient Education  2017 Elsevier Inc.  

## 2016-04-28 ENCOUNTER — Ambulatory Visit: Payer: BLUE CROSS/BLUE SHIELD | Admitting: Nutrition

## 2016-04-28 ENCOUNTER — Encounter: Payer: Self-pay | Admitting: Hematology and Oncology

## 2016-04-28 ENCOUNTER — Ambulatory Visit
Admission: RE | Admit: 2016-04-28 | Discharge: 2016-04-28 | Disposition: A | Discharge status: Home / Self Care | Payer: BLUE CROSS/BLUE SHIELD | Source: Ambulatory Visit | Attending: Radiation Oncology | Admitting: Radiation Oncology

## 2016-04-28 ENCOUNTER — Telehealth: Payer: Self-pay | Admitting: Hematology and Oncology

## 2016-04-28 ENCOUNTER — Encounter: Payer: Self-pay | Admitting: Radiation Oncology

## 2016-04-28 ENCOUNTER — Ambulatory Visit (HOSPITAL_BASED_OUTPATIENT_CLINIC_OR_DEPARTMENT_OTHER): Payer: BLUE CROSS/BLUE SHIELD | Admitting: Hematology and Oncology

## 2016-04-28 VITALS — Temp 98.5°F | Ht 70.0 in | Wt 167.0 lb

## 2016-04-28 VITALS — BP 120/75 | HR 72 | Temp 98.1°F | Resp 18 | Wt 167.3 lb

## 2016-04-28 DIAGNOSIS — R040 Epistaxis: Secondary | ICD-10-CM

## 2016-04-28 DIAGNOSIS — C090 Malignant neoplasm of tonsillar fossa: Secondary | ICD-10-CM

## 2016-04-28 DIAGNOSIS — R05 Cough: Secondary | ICD-10-CM | POA: Diagnosis not present

## 2016-04-28 DIAGNOSIS — R634 Abnormal weight loss: Secondary | ICD-10-CM

## 2016-04-28 DIAGNOSIS — C099 Malignant neoplasm of tonsil, unspecified: Secondary | ICD-10-CM

## 2016-04-28 DIAGNOSIS — Z87891 Personal history of nicotine dependence: Secondary | ICD-10-CM | POA: Diagnosis not present

## 2016-04-28 DIAGNOSIS — Z79899 Other long term (current) drug therapy: Secondary | ICD-10-CM | POA: Diagnosis not present

## 2016-04-28 DIAGNOSIS — I10 Essential (primary) hypertension: Secondary | ICD-10-CM | POA: Diagnosis not present

## 2016-04-28 DIAGNOSIS — R059 Cough, unspecified: Secondary | ICD-10-CM

## 2016-04-28 DIAGNOSIS — E039 Hypothyroidism, unspecified: Secondary | ICD-10-CM | POA: Diagnosis not present

## 2016-04-28 DIAGNOSIS — R1310 Dysphagia, unspecified: Secondary | ICD-10-CM | POA: Diagnosis not present

## 2016-04-28 DIAGNOSIS — E785 Hyperlipidemia, unspecified: Secondary | ICD-10-CM | POA: Diagnosis not present

## 2016-04-28 DIAGNOSIS — Z7982 Long term (current) use of aspirin: Secondary | ICD-10-CM | POA: Diagnosis not present

## 2016-04-28 DIAGNOSIS — Z510 Encounter for antineoplastic radiation therapy: Secondary | ICD-10-CM | POA: Diagnosis not present

## 2016-04-28 DIAGNOSIS — M1990 Unspecified osteoarthritis, unspecified site: Secondary | ICD-10-CM | POA: Diagnosis not present

## 2016-04-28 DIAGNOSIS — Z809 Family history of malignant neoplasm, unspecified: Secondary | ICD-10-CM | POA: Diagnosis not present

## 2016-04-28 MED ORDER — SCOPOLAMINE 1 MG/3DAYS TD PT72: 1.0000 | MEDICATED_PATCH | TRANSDERMAL | 12 refills | Status: DC

## 2016-04-28 MED FILL — SCOPOLAMINE 1 MG/3 DAY PATC: 1 | 30 days supply | Qty: 10 | Fill #0

## 2016-04-28 NOTE — Progress Notes (Signed)
[place] OFFICE PROGRESS NOTE  Patient Care Team: Haywood Pao, MD as PCP - General (Internal Medicine) Leota Sauers, RN as Oncology Nurse Navigator Eppie Gibson, MD as Attending Physician (Radiation Oncology) Heath Lark, MD as Consulting Physician (Hematology and Oncology) Karie Mainland, RD as Dietitian (Nutrition)  SUMMARY OF ONCOLOGIC HISTORY:   Tonsil cancer ([place])   02/07/2016 Imaging    Ct neck showed abnormal soft tissue within the left pharynx, extending from the left tonsillar pillar inferiorly to the level of the hyoid and filling the left vallecula. The appearance is most consistent with a neoplastic process. 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 Histologic sampling and direct visualization should be considered. Confluent adenopathy within the left cervical chain with areas of central hypoattenuation suggesting degrees of necrosis. This is most suggestive of metastatic spread. The areas of necrosis within the adenopathy combined with the above-described pharyngeal soft tissue mass are together suggestive of squamous cell carcinoma.      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 Borderline hypermetabolic small right station IIb lymph node. Several tiny lung nodules are observed, these are not hypermetabolic but are also below the threshold level for accurate characterization. Dedicated CT chest may be warranted to act as a baseline.      03/20/2016 Procedure    Placement of a right internal jugular approach power injectable Port-A-Cath. Fluoroscopic insertion of a 20-French pull-through gastrostomy tube.        03/24/2016 -  Chemotherapy    He received high dose cisplatin      03/24/2016 -  Radiation Therapy    He received concurrent radiation       INTERVAL HISTORY: Please see below for problem oriented charting. He is seen as part of his weekly supportive care visit The patient complained of significant mucus production despite antibiotic treatment. Denies fever or chills. He also had altered taste sensation and not able to swallow food by mouth due to mucous production He is able to tolerate only 4 cans of nutritional supplement per day Denies nausea or constipation. When he comes in for daily IV fluids, he noticed frequent epistaxis, self-limiting, twice a week. He is inquiring whether he could be due to heparin flushes. Certainly that is possible. He denies hematuria or hematochezia. He has significant weight loss on the weekly basis  REVIEW OF SYSTEMS:   Constitutional: Denies fevers, chills  Eyes: Denies blurriness of vision Ears, nose, mouth, throat, and face: Denies mucositis or sore throat Cardiovascular: Denies palpitation, chest discomfort or lower extremity swelling Gastrointestinal:  Denies nausea, heartburn or change in bowel habits Skin: Denies abnormal skin rashes Lymphatics: Denies new lymphadenopathy or easy bruising Neurological:Denies numbness, tingling or new weaknesses Behavioral/Psych: Mood is stable, no new changes  All other systems were reviewed with the patient and are negative.  I have reviewed the past medical history, past surgical history, social history and family history with the patient and they are unchanged from previous note.  ALLERGIES:  has No Known Allergies.  MEDICATIONS:  Current Outpatient Prescriptions  Medication Sig Dispense Refill  . acetaminophen (TYLENOL) 650 MG CR tablet Take 1,300  mg by mouth.    [name] amLODipine (NORVASC) 10 MG tablet Take 1 tablet (10 mg total) by mouth daily. 30 tablet 11  . aspirin EC 81 MG tablet Take by mouth.     . fenofibrate (TRICOR) 145 MG tablet     . fentaNYL (DURAGESIC - DOSED MCG/HR) 25 MCG/HR patch Place 1 patch (25 mcg total) onto the skin every 3 (three) days. 5 patch 0  . GLUCOSAMINE SULFATE PO Take by mouth.    [name] HYDROcodone-acetaminophen (NORCO) 10-325 MG tablet TAKE 1 TABLET BY MOUTH EVERY 4-6 HOURS AS NEEDED FOR PAIN  0  . lactulose (CHRONULAC) 10 GM/15ML solution Place 15 mLs (10 g total) into feeding tube 3 (three) times daily. 240 mL 0  . levothyroxine (SYNTHROID, LEVOTHROID) 50 MCG tablet     . lidocaine (XYLOCAINE) 2 % solution Patient: Mix 1part 2% viscous lidocaine, 1part H20. Swish/swallow 51mL of this mixture, 73min before meals and at bedtime, up to QID 200 mL 3  . lidocaine-prilocaine (EMLA) cream Apply to affected area once 30 g 3  . morphine (ROXANOL) 20 MG/ML concentrated solution Take 0.5 mLs (10 mg total) by mouth every 2 (two) hours as needed for severe pain. 120 mL 0  . Multiple Vitamin (MULTIVITAMIN) capsule Take by mouth.    . Nutritional Supplements (FEEDING SUPPLEMENT, OSMOLITE 1.5 CAL,) LIQD Begin with 1 can of tube feeding and give 4 times per day.  Flush with 45ml of water before and after tube feeding given.  Increase to goal of one and half cans of tube feeding 4 times per day. Patient will also need bolus tube feeding supplies. 1422 mL 0  . ondansetron (ZOFRAN) 8 MG tablet Take 1 tablet (8 mg total) by mouth 2 (two) times daily as needed. Start on the third day after chemotherapy. 30 tablet 1  . polyethylene glycol (MIRALAX) packet Take 17 g by mouth daily. 14 each 0  . prochlorperazine (COMPAZINE) 10 MG tablet Take 1 tablet (10 mg total) by mouth every 6 (six) hours as needed (Nausea or vomiting). 30 tablet 1  . sodium fluoride (FLUORISHIELD) 1.1 % GEL dental gel Instill one drop of gel per tooth space of fluoride tray. Place over teeth for 5 minutes. Remove. Spit out excess. Repeat nightly. 120 mL prn  . sucralfate (CARAFATE) 1 g tablet Dissolve 1 tablet in 10 mL  H20 and swallow QID prn sore throat. 60 tablet 3  . scopolamine (TRANSDERM-SCOP) 1 MG/3DAYS Place 1 patch (1.5 mg total) onto the skin every 3 (three) days. 10 patch 12   No current facility-administered medications for this visit.     PHYSICAL EXAMINATION: ECOG PERFORMANCE STATUS: 1 - Symptomatic but completely ambulatory  Vitals:   04/28/16 0912  BP: 120/75  Pulse: 72  Resp: 18  Temp: 98.1 F (36.7 C)   Filed Weights   04/28/16 0912  Weight: 167 lb 4.8 oz (75.9 kg)    GENERAL:alert, no distress and comfortable SKIN: skin color, texture, turgor are normal, no rashes or significant lesions EYES: normal, Conjunctiva are pink and non-injected, sclera clear OROPHARYNX:Noted mild mucositis. No thrush NECK: supple, thyroid normal size, non-tender, without nodularity LYMPH:  no palpable lymphadenopathy in the cervical, axillary or inguinal LUNGS: clear to auscultation and percussion with normal breathing effort HEART: regular rate & rhythm and no murmurs and no lower extremity edema ABDOMEN:abdomen soft, non-tender and normal bowel sounds Musculoskeletal:no cyanosis of digits and no clubbing  NEURO: alert & oriented x 3 with fluent speech,  no focal motor/sensory deficits  LABORATORY DATA:  I have reviewed the data as listed    Component Value Date/Time   NA 137 04/11/2016 0845   K 4.3 04/11/2016 0845   CL 105 03/20/2016 1200   CO2 27 04/11/2016 0845   GLUCOSE 89 04/11/2016 0845   BUN 14.7 04/11/2016 0845   CREATININE 1.0 04/11/2016 0845   CALCIUM 10.0 04/11/2016 0845   PROT 6.9 04/11/2016 0845   ALBUMIN 3.5 04/11/2016 0845   AST 17 04/11/2016 0845   ALT 24 04/11/2016 0845   ALKPHOS 49 04/11/2016 0845   BILITOT 0.38 04/11/2016 0845   GFRNONAA >60 03/20/2016 1200   GFRAA >60 03/20/2016 1200    No results found for: SPEP, UPEP  Lab Results  Component Value Date   WBC 2.2 (L) 04/11/2016   NEUTROABS 1.3 (L) 04/11/2016   HGB 11.6 (L) 04/11/2016   HCT 34.3 (L)  04/11/2016   MCV 86.6 04/11/2016   PLT 389 04/11/2016      Chemistry      Component Value Date/Time   NA 137 04/11/2016 0845   K 4.3 04/11/2016 0845   CL 105 03/20/2016 1200   CO2 27 04/11/2016 0845   BUN 14.7 04/11/2016 0845   CREATININE 1.0 04/11/2016 0845      Component Value Date/Time   CALCIUM 10.0 04/11/2016 0845   ALKPHOS 49 04/11/2016 0845   AST 17 04/11/2016 0845   ALT 24 04/11/2016 0845   BILITOT 0.38 04/11/2016 0845      ASSESSMENT & PLAN:  Tonsil cancer ([place]) I plan to reduce the dose of chemotherapy in the future by 20% due to tinnitus He is experiencing worsening side effects from treatment. I will continue to see him on a weekly basis for supportive care  Weight loss He continues to have progressive weight loss. He is not able to tolerate more than 4 cans of nutritional supplement per day due to early satiety and significant mucus production I recommend reduce water flushes through the feeding tube and come in daily for IV fluid support along with scopolamine patch to dry mucus production and he agrees I want him potential implication of delayed healing and possibility of not able to receive further chemotherapy due to significant pancytopenia with progressive weight loss He will try to increase feeding/ intake as tolerated  Cough He has significant mucus production I recommend trial of scopolamine patch  Frequent epistaxis He has frequent epistaxis after IV fluids due to heparin flushes. The amount of heparin in each flushes marginal at best. As long as the epistaxis are self-limiting, I think the  benefits of IV fluid outweighs the risk.   No orders of the defined types were placed in this encounter.  All questions were answered. The patient knows to call the clinic with any problems, questions or concerns. No barriers to learning was detected. I spent 25 minutes counseling the patient face to face. The total time spent in the appointment was 30  minutes and more than 50% was on counseling and review of test results     Heath Lark, MD 04/28/2016 10:27 AM

## 2016-04-28 NOTE — Progress Notes (Signed)
Nutrition follow-up completed with patient for tonsil cancer. Patient reports that he continues to have green secretions.  He took his last antibiotic today. He tolerates 4 cans Osmolite 1.5 daily. Weight is down approximately 30 pounds since October 24.  Weight recorded as 167 pounds December 18. Patient scheduled for IV fluids this week. Patient denies nausea, vomiting, constipation or diarrhea. Reports he has minimal pain and soreness and feels like he could begin eating some soft foods.  Estimated nutrition needs: 2225-2425 calories, 95-110 grams protein, 2.3 L fluid.  Nutrition diagnosis:  Inadequate oral intake continues. Severe malnutrition continues.  Intervention: Educated patient to decrease free water to 30 mL before and after bolus feedings. Encouraged patient to increase Osmolite 1.5 to 1-1/2 cans 4 times a day Recommended patient try some soft foods but stop eating 2 hours prior to next bolus feeding. Patient is agreeable to plan.  Monitoring, evaluation, goals:  Patient will tolerate increased calories and protein to minimize further weight loss.  Next visit: Friday, December 22, during infusion.  **Disclaimer: This note was dictated with voice recognition software. Similar sounding words can inadvertently be transcribed and this note may contain transcription errors which may not have been corrected upon publication of note.**

## 2016-04-28 NOTE — Progress Notes (Signed)
   Weekly Management Note:  Outpatient    ICD-9-CM ICD-10-CM   1. Carcinoma of tonsillar fossa (HCC) 146.1 C09.0     Current Dose: 48 Gy  Projected Dose: 70 Gy   Narrative:  The patient presents for routine under treatment assessment.  CBCT/MVCT images/Port film x-rays were reviewed.  The chart was checked.  Second cycle of chemotherapy was less rough, he reports.  Main complaint is a sinus infection for which he recently completed antibiotics....still congested.  Physical Findings:  Wt Readings from Last 3 Encounters:  04/28/16 167 lb (75.8 kg)  04/21/16 170 lb 9.6 oz (77.4 kg)  04/21/16 170 lb 14.4 oz (77.5 kg)    height is 5\' 10"  (1.778 m) and weight is 167 lb (75.8 kg). His temperature is 98.5 F (36.9 C). His oxygen saturation is 100%.  brisk gag reflex. Mucositis, patchy, in oropharynx. copious saliva. ulcerative left tonsil tumor no longer visible. Skin intact over neck, pink.  Left upper neck mass is significantly less palpable but there is still fullness in 1b region (submandibular gland vs tumor?).  CBC    Component Value Date/Time   WBC 2.2 (L) 04/11/2016 0845   WBC 6.2 03/20/2016 1200   RBC 3.96 (L) 04/11/2016 0845   RBC 4.16 (L) 03/20/2016 1200   HGB 11.6 (L) 04/11/2016 0845   HCT 34.3 (L) 04/11/2016 0845   PLT 389 04/11/2016 0845   MCV 86.6 04/11/2016 0845   MCH 29.3 04/11/2016 0845   MCH 30.0 03/20/2016 1200   MCHC 33.8 04/11/2016 0845   MCHC 35.5 03/20/2016 1200   RDW 12.8 04/11/2016 0845   LYMPHSABS 0.4 (L) 04/11/2016 0845   MONOABS 0.4 04/11/2016 0845   EOSABS 0.0 04/11/2016 0845   BASOSABS 0.0 04/11/2016 0845     CMP     Component Value Date/Time   NA 137 04/11/2016 0845   K 4.3 04/11/2016 0845   CL 105 03/20/2016 1200   CO2 27 04/11/2016 0845   GLUCOSE 89 04/11/2016 0845   BUN 14.7 04/11/2016 0845   CREATININE 1.0 04/11/2016 0845   CALCIUM 10.0 04/11/2016 0845   PROT 6.9 04/11/2016 0845   ALBUMIN 3.5 04/11/2016 0845   AST 17 04/11/2016 0845    ALT 24 04/11/2016 0845   ALKPHOS 49 04/11/2016 0845   BILITOT 0.38 04/11/2016 0845   GFRNONAA >60 03/20/2016 1200   GFRAA >60 03/20/2016 1200     Impression:  The patient is tolerating radiotherapy.   Plan:  Continue radiotherapy as planned.   Use sucralfate and lidocaine prn for esophagitis.    -----------------------------------  Eppie Gibson, MD

## 2016-04-28 NOTE — Assessment & Plan Note (Signed)
He continues to have progressive weight loss. He is not able to tolerate more than 4 cans of nutritional supplement per day due to early satiety and significant mucus production I recommend reduce water flushes through the feeding tube and come in daily for IV fluid support along with scopolamine patch to dry mucus production and he agrees I want him potential implication of delayed healing and possibility of not able to receive further chemotherapy due to significant pancytopenia with progressive weight loss He will try to increase feeding/ intake as tolerated

## 2016-04-28 NOTE — Assessment & Plan Note (Signed)
He has significant mucus production I recommend trial of scopolamine patch

## 2016-04-28 NOTE — Telephone Encounter (Signed)
Added IVF's for 12/19 and 12/22 per 12/18 los. Gave patient avs report and appointments for December and January.

## 2016-04-28 NOTE — Assessment & Plan Note (Signed)
He has frequent epistaxis after IV fluids due to heparin flushes. The amount of heparin in each flushes marginal at best. As long as the epistaxis are self-limiting, I think the  benefits of IV fluid outweighs the risk.

## 2016-04-28 NOTE — Progress Notes (Signed)
Mr. Yurkovich is here for his 24th fraction of radiation to his Left Tonsil and Bilateral Neck. He denies pain. He does have some fatigue. He has an appointment with Dr. Alvy Bimler today. He received supplemental IV Fluids last week. He is mostly using his feeding tube. He is not eating solids orally. He is instilling 4 cans of osmolite daily, and if he attempts more he feels full. He is drinking and instilling about 50 ounces of water daily. He has thick saliva, and is recovering from an possible sinus infection. He finished antibiotics today. He is using sonafine twice daily to his radiation site. The skin to his neck is red, without open areas noted. He is having normal bowel movements, and has laxatives available if needed.   Temp 98.5 F (36.9 C)   Ht 5\' 10"  (1.778 m)   Wt 167 lb (75.8 kg)   SpO2 100% Comment: room air  BMI 23.96 kg/m    Orthostatics: BP sitting 122/76 pulse 72, BP standing 121/71, pulse 68  Wt Readings from Last 3 Encounters:  04/28/16 167 lb (75.8 kg)  04/21/16 170 lb 9.6 oz (77.4 kg)  04/21/16 170 lb 14.4 oz (77.5 kg)

## 2016-04-28 NOTE — Assessment & Plan Note (Signed)
I plan to reduce the dose of chemotherapy in the future by 20% due to tinnitus He is experiencing worsening side effects from treatment. I will continue to see him on a weekly basis for supportive care

## 2016-04-29 ENCOUNTER — Ambulatory Visit (HOSPITAL_BASED_OUTPATIENT_CLINIC_OR_DEPARTMENT_OTHER): Payer: BLUE CROSS/BLUE SHIELD

## 2016-04-29 ENCOUNTER — Ambulatory Visit
Admission: RE | Admit: 2016-04-29 | Discharge: 2016-04-29 | Disposition: A | Discharge status: Home / Self Care | Payer: BLUE CROSS/BLUE SHIELD | Source: Ambulatory Visit | Attending: Radiation Oncology | Admitting: Radiation Oncology

## 2016-04-29 VITALS — BP 126/83 | HR 64 | Temp 98.9°F | Resp 18

## 2016-04-29 DIAGNOSIS — E039 Hypothyroidism, unspecified: Secondary | ICD-10-CM | POA: Diagnosis not present

## 2016-04-29 DIAGNOSIS — I10 Essential (primary) hypertension: Secondary | ICD-10-CM | POA: Diagnosis not present

## 2016-04-29 DIAGNOSIS — R1310 Dysphagia, unspecified: Secondary | ICD-10-CM | POA: Diagnosis not present

## 2016-04-29 DIAGNOSIS — M1990 Unspecified osteoarthritis, unspecified site: Secondary | ICD-10-CM | POA: Diagnosis not present

## 2016-04-29 DIAGNOSIS — C099 Malignant neoplasm of tonsil, unspecified: Secondary | ICD-10-CM

## 2016-04-29 DIAGNOSIS — Z510 Encounter for antineoplastic radiation therapy: Secondary | ICD-10-CM | POA: Diagnosis not present

## 2016-04-29 DIAGNOSIS — Z87891 Personal history of nicotine dependence: Secondary | ICD-10-CM | POA: Diagnosis not present

## 2016-04-29 DIAGNOSIS — Z7982 Long term (current) use of aspirin: Secondary | ICD-10-CM | POA: Diagnosis not present

## 2016-04-29 DIAGNOSIS — C090 Malignant neoplasm of tonsillar fossa: Secondary | ICD-10-CM | POA: Diagnosis not present

## 2016-04-29 DIAGNOSIS — E785 Hyperlipidemia, unspecified: Secondary | ICD-10-CM | POA: Diagnosis not present

## 2016-04-29 DIAGNOSIS — Z79899 Other long term (current) drug therapy: Secondary | ICD-10-CM | POA: Diagnosis not present

## 2016-04-29 DIAGNOSIS — Z809 Family history of malignant neoplasm, unspecified: Secondary | ICD-10-CM | POA: Diagnosis not present

## 2016-04-29 MED ORDER — SODIUM CHLORIDE 0.9 % IV SOLN
Freq: Once | INTRAVENOUS | Status: AC
  Administered 2016-04-29: 09:00:00 via INTRAVENOUS

## 2016-04-29 MED ORDER — HEPARIN SOD (PORK) LOCK FLUSH 100 UNIT/ML IV SOLN
500.0000 [IU] | Freq: Once | INTRAVENOUS | Status: AC | PRN
  Administered 2016-04-29: 500 [IU]
  Filled 2016-04-29: qty 5

## 2016-04-29 MED ORDER — ONDANSETRON HCL 4 MG/2ML IJ SOLN
INTRAMUSCULAR | Status: AC
  Filled 2016-04-29: qty 4

## 2016-04-29 MED ORDER — ONDANSETRON HCL 4 MG/2ML IJ SOLN
8.0000 mg | Freq: Once | INTRAMUSCULAR | Status: AC
  Administered 2016-04-29: 8 mg via INTRAVENOUS

## 2016-04-29 MED ORDER — ONDANSETRON HCL 40 MG/20ML IJ SOLN: Freq: Once | INTRAMUSCULAR | Status: DC

## 2016-04-29 MED ORDER — SODIUM CHLORIDE 0.9% FLUSH
10.0000 mL | INTRAVENOUS | Status: DC | PRN
  Administered 2016-04-29: 10 mL
  Filled 2016-04-29: qty 10

## 2016-04-29 NOTE — Patient Instructions (Signed)
Dehydration, Adult Dehydration is a condition in which there is not enough fluid or water in the body. This happens when you lose more fluids than you take in. Important organs, such as the kidneys, brain, and heart, cannot function without a proper amount of fluids. Any loss of fluids from the body can lead to dehydration. Dehydration can range from mild to severe. This condition should be treated right away to prevent it from becoming severe. What are the causes? This condition may be caused by:  Vomiting.  Diarrhea.  Excessive sweating, such as from heat exposure or exercise.  Not drinking enough fluid, especially:  When ill.  While doing activity that requires a lot of energy.  Excessive urination.  Fever.  Infection.  Certain medicines, such as medicines that cause the body to lose excess fluid (diuretics).  Inability to access safe drinking water.  Reduced physical ability to get adequate water and food. What increases the risk? This condition is more likely to develop in people:  Who have a poorly controlled long-term (chronic) illness, such as diabetes, heart disease, or kidney disease.  Who are age 65 or older.  Who are disabled.  Who live in a place with high altitude.  Who play endurance sports. What are the signs or symptoms? Symptoms of mild dehydration may include:   Thirst.  Dry lips.  Slightly dry mouth.  Dry, warm skin.  Dizziness. Symptoms of moderate dehydration may include:   Very dry mouth.  Muscle cramps.  Dark urine. Urine may be the color of tea.  Decreased urine production.  Decreased tear production.  Heartbeat that is irregular or faster than normal (palpitations).  Headache.  Light-headedness, especially when you stand up from a sitting position.  Fainting (syncope). Symptoms of severe dehydration may include:   Changes in skin, such as:  Cold and clammy skin.  Blotchy (mottled) or pale skin.  Skin that does  not quickly return to normal after being lightly pinched and released (poor skin turgor).  Changes in body fluids, such as:  Extreme thirst.  No tear production.  Inability to sweat when body temperature is high, such as in hot weather.  Very little urine production.  Changes in vital signs, such as:  Weak pulse.  Pulse that is more than 100 beats a minute when sitting still.  Rapid breathing.  Low blood pressure.  Other changes, such as:  Sunken eyes.  Cold hands and feet.  Confusion.  Lack of energy (lethargy).  Difficulty waking up from sleep.  Short-term weight loss.  Unconsciousness. How is this diagnosed? This condition is diagnosed based on your symptoms and a physical exam. Blood and urine tests may be done to help confirm the diagnosis. How is this treated? Treatment for this condition depends on the severity. Mild or moderate dehydration can often be treated at home. Treatment should be started right away. Do not wait until dehydration becomes severe. Severe dehydration is an emergency and it needs to be treated in a hospital. Treatment for mild dehydration may include:   Drinking more fluids.  Replacing salts and minerals in your blood (electrolytes) that you may have lost. Treatment for moderate dehydration may include:   Drinking an oral rehydration solution (ORS). This is a drink that helps you replace fluids and electrolytes (rehydrate). It can be found at pharmacies and retail stores. Treatment for severe dehydration may include:   Receiving fluids through an IV tube.  Receiving an electrolyte solution through a feeding tube that is   passed through your nose and into your stomach (nasogastric tube, or NG tube).  Correcting any abnormalities in electrolytes.  Treating the underlying cause of dehydration. Follow these instructions at home:  If directed by your health care provider, drink an ORS:  Make an ORS by following instructions on the  package.  Start by drinking small amounts, about  cup (120 mL) every 5-10 minutes.  Slowly increase how much you drink until you have taken the amount recommended by your health care provider.  Drink enough clear fluid to keep your urine clear or pale yellow. If you were told to drink an ORS, finish the ORS first, then start slowly drinking other clear fluids. Drink fluids such as:  Water. Do not drink only water. Doing that can lead to having too little salt (sodium) in the body (hyponatremia).  Ice chips.  Fruit juice that you have added water to (diluted fruit juice).  Low-calorie sports drinks.  Avoid:  Alcohol.  Drinks that contain a lot of sugar. These include high-calorie sports drinks, fruit juice that is not diluted, and soda.  Caffeine.  Foods that are greasy or contain a lot of fat or sugar.  Take over-the-counter and prescription medicines only as told by your health care provider.  Do not take sodium tablets. This can lead to having too much sodium in the body (hypernatremia).  Eat foods that contain a healthy balance of electrolytes, such as bananas, oranges, potatoes, tomatoes, and spinach.  Keep all follow-up visits as told by your health care provider. This is important. Contact a health care provider if:  You have abdominal pain that:  Gets worse.  Stays in one area (localizes).  You have a rash.  You have a stiff neck.  You are more irritable than usual.  You are sleepier or more difficult to wake up than usual.  You feel weak or dizzy.  You feel very thirsty.  You have urinated only a small amount of very dark urine over 6-8 hours. Get help right away if:  You have symptoms of severe dehydration.  You cannot drink fluids without vomiting.  Your symptoms get worse with treatment.  You have a fever.  You have a severe headache.  You have vomiting or diarrhea that:  Gets worse.  Does not go away.  You have blood or green matter  (bile) in your vomit.  You have blood in your stool. This may cause stool to look black and tarry.  You have not urinated in 6-8 hours.  You faint.  Your heart rate while sitting still is over 100 beats a minute.  You have trouble breathing. This information is not intended to replace advice given to you by your health care provider. Make sure you discuss any questions you have with your health care provider. Document Released: 04/28/2005 Document Revised: 11/23/2015 Document Reviewed: 06/22/2015 Elsevier Interactive Patient Education  2017 Elsevier Inc.  

## 2016-04-30 ENCOUNTER — Ambulatory Visit: Payer: Self-pay

## 2016-04-30 ENCOUNTER — Ambulatory Visit
Admission: RE | Admit: 2016-04-30 | Discharge: 2016-04-30 | Disposition: A | Discharge status: Home / Self Care | Payer: BLUE CROSS/BLUE SHIELD | Source: Ambulatory Visit | Attending: Radiation Oncology | Admitting: Radiation Oncology

## 2016-04-30 DIAGNOSIS — I10 Essential (primary) hypertension: Secondary | ICD-10-CM | POA: Diagnosis not present

## 2016-04-30 DIAGNOSIS — E039 Hypothyroidism, unspecified: Secondary | ICD-10-CM | POA: Diagnosis not present

## 2016-04-30 DIAGNOSIS — Z510 Encounter for antineoplastic radiation therapy: Secondary | ICD-10-CM | POA: Diagnosis not present

## 2016-04-30 DIAGNOSIS — Z87891 Personal history of nicotine dependence: Secondary | ICD-10-CM | POA: Diagnosis not present

## 2016-04-30 DIAGNOSIS — M1990 Unspecified osteoarthritis, unspecified site: Secondary | ICD-10-CM | POA: Diagnosis not present

## 2016-04-30 DIAGNOSIS — Z79899 Other long term (current) drug therapy: Secondary | ICD-10-CM | POA: Diagnosis not present

## 2016-04-30 DIAGNOSIS — Z7982 Long term (current) use of aspirin: Secondary | ICD-10-CM | POA: Diagnosis not present

## 2016-04-30 DIAGNOSIS — R1310 Dysphagia, unspecified: Secondary | ICD-10-CM | POA: Diagnosis not present

## 2016-04-30 DIAGNOSIS — Z809 Family history of malignant neoplasm, unspecified: Secondary | ICD-10-CM | POA: Diagnosis not present

## 2016-04-30 DIAGNOSIS — E785 Hyperlipidemia, unspecified: Secondary | ICD-10-CM | POA: Diagnosis not present

## 2016-04-30 DIAGNOSIS — C090 Malignant neoplasm of tonsillar fossa: Secondary | ICD-10-CM | POA: Diagnosis not present

## 2016-05-01 ENCOUNTER — Ambulatory Visit
Admission: RE | Admit: 2016-05-01 | Discharge: 2016-05-01 | Disposition: A | Discharge status: Home / Self Care | Payer: BLUE CROSS/BLUE SHIELD | Source: Ambulatory Visit | Attending: Radiation Oncology | Admitting: Radiation Oncology

## 2016-05-01 DIAGNOSIS — Z510 Encounter for antineoplastic radiation therapy: Secondary | ICD-10-CM | POA: Diagnosis not present

## 2016-05-01 DIAGNOSIS — E785 Hyperlipidemia, unspecified: Secondary | ICD-10-CM | POA: Diagnosis not present

## 2016-05-01 DIAGNOSIS — Z87891 Personal history of nicotine dependence: Secondary | ICD-10-CM | POA: Diagnosis not present

## 2016-05-01 DIAGNOSIS — C090 Malignant neoplasm of tonsillar fossa: Secondary | ICD-10-CM | POA: Diagnosis not present

## 2016-05-01 DIAGNOSIS — E039 Hypothyroidism, unspecified: Secondary | ICD-10-CM | POA: Diagnosis not present

## 2016-05-01 DIAGNOSIS — Z7982 Long term (current) use of aspirin: Secondary | ICD-10-CM | POA: Diagnosis not present

## 2016-05-01 DIAGNOSIS — Z809 Family history of malignant neoplasm, unspecified: Secondary | ICD-10-CM | POA: Diagnosis not present

## 2016-05-01 DIAGNOSIS — M1990 Unspecified osteoarthritis, unspecified site: Secondary | ICD-10-CM | POA: Diagnosis not present

## 2016-05-01 DIAGNOSIS — Z79899 Other long term (current) drug therapy: Secondary | ICD-10-CM | POA: Diagnosis not present

## 2016-05-01 DIAGNOSIS — I10 Essential (primary) hypertension: Secondary | ICD-10-CM | POA: Diagnosis not present

## 2016-05-01 DIAGNOSIS — R1310 Dysphagia, unspecified: Secondary | ICD-10-CM | POA: Diagnosis not present

## 2016-05-02 ENCOUNTER — Ambulatory Visit
Admission: RE | Admit: 2016-05-02 | Discharge: 2016-05-02 | Disposition: A | Discharge status: Home / Self Care | Payer: BLUE CROSS/BLUE SHIELD | Source: Ambulatory Visit | Attending: Radiation Oncology | Admitting: Radiation Oncology

## 2016-05-02 ENCOUNTER — Telehealth: Payer: Self-pay | Admitting: Medical Oncology

## 2016-05-02 ENCOUNTER — Other Ambulatory Visit (HOSPITAL_BASED_OUTPATIENT_CLINIC_OR_DEPARTMENT_OTHER): Payer: BLUE CROSS/BLUE SHIELD

## 2016-05-02 ENCOUNTER — Other Ambulatory Visit: Payer: Self-pay | Admitting: Hematology and Oncology

## 2016-05-02 ENCOUNTER — Telehealth: Payer: Self-pay | Admitting: *Deleted

## 2016-05-02 ENCOUNTER — Ambulatory Visit (HOSPITAL_BASED_OUTPATIENT_CLINIC_OR_DEPARTMENT_OTHER): Payer: BLUE CROSS/BLUE SHIELD

## 2016-05-02 ENCOUNTER — Ambulatory Visit: Payer: BLUE CROSS/BLUE SHIELD | Admitting: Nutrition

## 2016-05-02 VITALS — BP 128/70 | HR 83 | Temp 98.4°F | Resp 18

## 2016-05-02 DIAGNOSIS — R1310 Dysphagia, unspecified: Secondary | ICD-10-CM | POA: Diagnosis not present

## 2016-05-02 DIAGNOSIS — Z510 Encounter for antineoplastic radiation therapy: Secondary | ICD-10-CM | POA: Diagnosis not present

## 2016-05-02 DIAGNOSIS — E785 Hyperlipidemia, unspecified: Secondary | ICD-10-CM | POA: Diagnosis not present

## 2016-05-02 DIAGNOSIS — C090 Malignant neoplasm of tonsillar fossa: Secondary | ICD-10-CM | POA: Diagnosis not present

## 2016-05-02 DIAGNOSIS — C099 Malignant neoplasm of tonsil, unspecified: Secondary | ICD-10-CM

## 2016-05-02 DIAGNOSIS — Z809 Family history of malignant neoplasm, unspecified: Secondary | ICD-10-CM | POA: Diagnosis not present

## 2016-05-02 DIAGNOSIS — M1990 Unspecified osteoarthritis, unspecified site: Secondary | ICD-10-CM | POA: Diagnosis not present

## 2016-05-02 DIAGNOSIS — Z7982 Long term (current) use of aspirin: Secondary | ICD-10-CM | POA: Diagnosis not present

## 2016-05-02 DIAGNOSIS — I10 Essential (primary) hypertension: Secondary | ICD-10-CM | POA: Diagnosis not present

## 2016-05-02 DIAGNOSIS — R110 Nausea: Secondary | ICD-10-CM | POA: Diagnosis not present

## 2016-05-02 DIAGNOSIS — Z79899 Other long term (current) drug therapy: Secondary | ICD-10-CM | POA: Diagnosis not present

## 2016-05-02 DIAGNOSIS — E039 Hypothyroidism, unspecified: Secondary | ICD-10-CM | POA: Diagnosis not present

## 2016-05-02 DIAGNOSIS — Z87891 Personal history of nicotine dependence: Secondary | ICD-10-CM | POA: Diagnosis not present

## 2016-05-02 LAB — CBC WITH DIFFERENTIAL/PLATELET
BASO%: 0.4 % (ref 0.0–2.0)
BASOS ABS: 0 10*3/uL (ref 0.0–0.1)
EOS ABS: 0 10*3/uL (ref 0.0–0.5)
EOS%: 1.1 % (ref 0.0–7.0)
HEMATOCRIT: 32.8 % — AB (ref 38.4–49.9)
HEMOGLOBIN: 10.9 g/dL — AB (ref 13.0–17.1)
LYMPH#: 0.2 10*3/uL — AB (ref 0.9–3.3)
LYMPH%: 6.1 % — ABNORMAL LOW (ref 14.0–49.0)
MCH: 29.5 pg (ref 27.2–33.4)
MCHC: 33.2 g/dL (ref 32.0–36.0)
MCV: 88.6 fL (ref 79.3–98.0)
MONO#: 0.5 10*3/uL (ref 0.1–0.9)
MONO%: 16.4 % — AB (ref 0.0–14.0)
NEUT%: 76 % — ABNORMAL HIGH (ref 39.0–75.0)
NEUTROS ABS: 2.1 10*3/uL (ref 1.5–6.5)
Platelets: 447 10*3/uL — ABNORMAL HIGH (ref 140–400)
RBC: 3.7 10*6/uL — ABNORMAL LOW (ref 4.20–5.82)
RDW: 13.5 % (ref 11.0–14.6)
WBC: 2.8 10*3/uL — AB (ref 4.0–10.3)

## 2016-05-02 LAB — COMPREHENSIVE METABOLIC PANEL
ALT: 13 U/L (ref 0–55)
AST: 13 U/L (ref 5–34)
Albumin: 3.5 g/dL (ref 3.5–5.0)
Alkaline Phosphatase: 52 U/L (ref 40–150)
Anion Gap: 8 mEq/L (ref 3–11)
BUN: 22.2 mg/dL (ref 7.0–26.0)
CALCIUM: 10.4 mg/dL (ref 8.4–10.4)
CHLORIDE: 104 meq/L (ref 98–109)
CO2: 29 meq/L (ref 22–29)
CREATININE: 1.1 mg/dL (ref 0.7–1.3)
EGFR: 78 mL/min/{1.73_m2} — AB (ref >90)
Glucose: 131 mg/dl (ref 70–140)
POTASSIUM: 4.2 meq/L (ref 3.5–5.1)
Sodium: 140 mEq/L (ref 136–145)
Total Bilirubin: 0.38 mg/dL (ref 0.20–1.20)
Total Protein: 7.1 g/dL (ref 6.4–8.3)

## 2016-05-02 LAB — MAGNESIUM: MAGNESIUM: 2 mg/dL (ref 1.5–2.5)

## 2016-05-02 MED ORDER — ONDANSETRON HCL 4 MG/2ML IJ SOLN
8.0000 mg | Freq: Once | INTRAMUSCULAR | Status: AC
  Administered 2016-05-02: 8 mg via INTRAVENOUS

## 2016-05-02 MED ORDER — SODIUM CHLORIDE 0.9 % IV SOLN
Freq: Once | INTRAVENOUS | Status: AC
  Administered 2016-05-02: 09:00:00 via INTRAVENOUS

## 2016-05-02 MED ORDER — ONDANSETRON HCL 4 MG/2ML IJ SOLN
INTRAMUSCULAR | Status: AC
  Filled 2016-05-02: qty 2

## 2016-05-02 MED ORDER — SODIUM CHLORIDE 0.9% FLUSH
10.0000 mL | INTRAVENOUS | Status: DC | PRN
  Administered 2016-05-02: 10 mL
  Filled 2016-05-02: qty 10

## 2016-05-02 MED ORDER — ONDANSETRON HCL 40 MG/20ML IJ SOLN: Freq: Once | INTRAMUSCULAR | Status: DC

## 2016-05-02 MED ORDER — HEPARIN SOD (PORK) LOCK FLUSH 100 UNIT/ML IV SOLN
500.0000 [IU] | Freq: Once | INTRAVENOUS | Status: AC | PRN
  Administered 2016-05-02: 500 [IU]
  Filled 2016-05-02: qty 5

## 2016-05-02 NOTE — Telephone Encounter (Signed)
err

## 2016-05-02 NOTE — Patient Instructions (Signed)
Nausea, Adult  Nausea is the feeling of an upset stomach or having to vomit. Nausea on its own is not usually a serious concern, but it may be an early sign of a more serious medical problem. As nausea gets worse, it can lead to vomiting. If vomiting develops, or if you are not able to drink enough fluids, you are at risk of becoming dehydrated. Dehydration can make you tired and thirsty, cause you to have a dry mouth, and decrease how often you urinate. Older adults and people with other diseases or a weak immune system are at higher risk for dehydration. The main goals of treating your nausea are:  · To limit repeated nausea episodes.  · To prevent vomiting and dehydration.    Follow these instructions at home:  Follow instructions from your health care provider about how to care for yourself at home.  Eating and drinking   Follow these recommendations as told by your health care provider:  · Take an oral rehydration solution (ORS). This is a drink that is sold at pharmacies and retail stores.  · Drink clear fluids in small amounts as you are able. Clear fluids include water, ice chips, diluted fruit juice, and low-calorie sports drinks.  · Eat bland, easy-to-digest foods in small amounts as you are able. These foods include bananas, applesauce, rice, lean meats, toast, and crackers.  · Avoid drinking fluids that contain a lot of sugar or caffeine, such as energy drinks, sports drinks, and soda.  · Avoid alcohol.  · Avoid spicy or fatty foods.    General instructions   · Drink enough fluid to keep your urine clear or pale yellow.  · Wash your hands often. If soap and water are not available, use hand sanitizer.  · Make sure that all people in your household wash their hands well and often.  · Rest at home while you recover.  · Take over-the-counter and prescription medicines only as told by your health care provider.  · Breathe slowly and deeply when you feel nauseous.  · Watch your condition for any  changes.  · Keep all follow-up visits as told by your health care provider. This is important.  Contact a health care provider if:  · You have a headache.  · You have new symptoms.  · Your nausea gets worse.  · You have a fever.  · You feel light-headed or dizzy.  · You vomit.  · You cannot keep fluids down.  Get help right away if:  · You have pain in your chest, neck, arm, or jaw.  · You feel extremely weak or you faint.  · You have vomit that is bright red or looks like coffee grounds.  · You have bloody or black stools or stools that look like tar.  · You have a severe headache, a stiff neck, or both.  · You have severe pain, cramping, or bloating in your abdomen.  · You have a rash.  · You have difficulty breathing or are breathing very quickly.  · Your heart is beating very quickly.  · Your skin feels cold and clammy.  · You feel confused.  · You have pain when you urinate.  · You have signs of dehydration, such as:  ? Dark urine, very little, or no urine.  ? Cracked lips.  ? Dry mouth.  ? Sunken eyes.  ? Sleepiness.  ? Weakness.  These symptoms may represent a serious problem that is an emergency. Do   not wait to see if the symptoms will go away. Get medical help right away. Call your local emergency services (911 in the [place] Do not drive yourself to the hospital.  This information is not intended to replace advice given to you by your health care provider. Make sure you discuss any questions you have with your health care provider.  Document Released: 06/05/2004 Document Revised: 10/01/2015 Document Reviewed: 01/02/2015  Elsevier Interactive Patient Education © 2017 [employer]

## 2016-05-02 NOTE — Progress Notes (Signed)
Nutrition follow-up completed with patient for tonsil cancer. Patient reports he continues to have green secretions.  His last antibiotic on Monday. Reports he is using 5 cans Osmolite 1.5 times a day plus one milkshake. Reports his weight has not changed.  Estimated nutrition needs: 2225-2425 calories, 95-110 grams protein, 2.3 L fluid.  Nutrition diagnosis: Inadequate oral intake continues. Severe malnutrition continues.  Intervention: Patient educated to continue Osmolite 1.5 - 5 cans a day plus one milkshake by mouth. Provided supportive and listening ear. Questions answered.  Monitoring, evaluation, goals: Patient will tolerate adequate calories and protein to minimize weight loss.  Next visit: Wednesday, December 27, during infusion.  **Disclaimer: This note was dictated with voice recognition software. Similar sounding words can inadvertently be transcribed and this note may contain transcription errors which may not have been corrected upon publication of note.**

## 2016-05-02 NOTE — Telephone Encounter (Signed)
Lm with note below. To call if has questions

## 2016-05-02 NOTE — Progress Notes (Signed)
Patient complaining of yellow sinus drainage. States symptoms continue to be the same or maybe a little better since taking the Augmentin. Patient also complaining of a cough that at times makes him feel like he's going to gag. Message sent to Dr Alvy Bimler for further direction.

## 2016-05-02 NOTE — Telephone Encounter (Signed)
Per Dr. Alvy Bimler I instructed pt to start scopolamine patches ( he has not started them) , to stay hydrated and to call for fever >100.90F or for dark green sputum /sinus drainage. He voices understanding.

## 2016-05-02 NOTE — Telephone Encounter (Signed)
-----   Message from Heath Lark, MD sent at 05/02/2016  9:26 AM EST ----- Regarding: labs Pls let patient know labs are OK for chemo next week as scheduled ----- Message ----- From: Interface, Lab In Three Zero One Sent: 05/02/2016   8:45 AM To: Heath Lark, MD

## 2016-05-06 ENCOUNTER — Encounter: Payer: Self-pay | Admitting: Radiation Oncology

## 2016-05-06 ENCOUNTER — Ambulatory Visit (HOSPITAL_BASED_OUTPATIENT_CLINIC_OR_DEPARTMENT_OTHER): Payer: BLUE CROSS/BLUE SHIELD

## 2016-05-06 ENCOUNTER — Ambulatory Visit
Admission: RE | Admit: 2016-05-06 | Discharge: 2016-05-06 | Disposition: A | Discharge status: Home / Self Care | Payer: BLUE CROSS/BLUE SHIELD | Source: Ambulatory Visit | Attending: Radiation Oncology | Admitting: Radiation Oncology

## 2016-05-06 VITALS — BP 133/79 | HR 81 | Temp 98.6°F | Resp 18

## 2016-05-06 VITALS — Temp 98.4°F | Ht 70.0 in | Wt 157.8 lb

## 2016-05-06 DIAGNOSIS — Z5111 Encounter for antineoplastic chemotherapy: Secondary | ICD-10-CM | POA: Diagnosis not present

## 2016-05-06 DIAGNOSIS — I10 Essential (primary) hypertension: Secondary | ICD-10-CM | POA: Diagnosis not present

## 2016-05-06 DIAGNOSIS — Z79899 Other long term (current) drug therapy: Secondary | ICD-10-CM | POA: Diagnosis not present

## 2016-05-06 DIAGNOSIS — E785 Hyperlipidemia, unspecified: Secondary | ICD-10-CM | POA: Diagnosis not present

## 2016-05-06 DIAGNOSIS — C090 Malignant neoplasm of tonsillar fossa: Secondary | ICD-10-CM

## 2016-05-06 DIAGNOSIS — Z923 Personal history of irradiation: Secondary | ICD-10-CM | POA: Diagnosis not present

## 2016-05-06 DIAGNOSIS — C099 Malignant neoplasm of tonsil, unspecified: Secondary | ICD-10-CM

## 2016-05-06 DIAGNOSIS — R1310 Dysphagia, unspecified: Secondary | ICD-10-CM | POA: Diagnosis not present

## 2016-05-06 DIAGNOSIS — M25511 Pain in right shoulder: Secondary | ICD-10-CM | POA: Insufficient documentation

## 2016-05-06 DIAGNOSIS — Z7982 Long term (current) use of aspirin: Secondary | ICD-10-CM | POA: Diagnosis not present

## 2016-05-06 DIAGNOSIS — M1990 Unspecified osteoarthritis, unspecified site: Secondary | ICD-10-CM | POA: Diagnosis not present

## 2016-05-06 DIAGNOSIS — Z5189 Encounter for other specified aftercare: Secondary | ICD-10-CM | POA: Diagnosis not present

## 2016-05-06 DIAGNOSIS — Z87891 Personal history of nicotine dependence: Secondary | ICD-10-CM | POA: Diagnosis not present

## 2016-05-06 DIAGNOSIS — Z510 Encounter for antineoplastic radiation therapy: Secondary | ICD-10-CM | POA: Diagnosis not present

## 2016-05-06 DIAGNOSIS — E039 Hypothyroidism, unspecified: Secondary | ICD-10-CM | POA: Diagnosis not present

## 2016-05-06 DIAGNOSIS — Z809 Family history of malignant neoplasm, unspecified: Secondary | ICD-10-CM | POA: Diagnosis not present

## 2016-05-06 MED ORDER — SODIUM CHLORIDE 0.9 % IV SOLN
79.0000 mg/m2 | Freq: Once | INTRAVENOUS | Status: AC
  Administered 2016-05-06: 165 mg via INTRAVENOUS
  Filled 2016-05-06: qty 165

## 2016-05-06 MED ORDER — POTASSIUM CHLORIDE 2 MEQ/ML IV SOLN
Freq: Once | INTRAVENOUS | Status: AC
  Administered 2016-05-06: 10:00:00 via INTRAVENOUS
  Filled 2016-05-06: qty 10

## 2016-05-06 MED ORDER — PALONOSETRON HCL INJECTION 0.25 MG/5ML
0.2500 mg | Freq: Once | INTRAVENOUS | Status: AC
  Administered 2016-05-06: 0.25 mg via INTRAVENOUS

## 2016-05-06 MED ORDER — PALONOSETRON HCL INJECTION 0.25 MG/5ML
INTRAVENOUS | Status: AC
  Filled 2016-05-06: qty 5

## 2016-05-06 MED ORDER — SODIUM CHLORIDE 0.9 % IV SOLN
Freq: Once | INTRAVENOUS | Status: AC
  Administered 2016-05-06: 12:00:00 via INTRAVENOUS
  Filled 2016-05-06: qty 5

## 2016-05-06 MED ORDER — HEPARIN SOD (PORK) LOCK FLUSH 100 UNIT/ML IV SOLN
500.0000 [IU] | Freq: Once | INTRAVENOUS | Status: AC | PRN
  Administered 2016-05-06: 500 [IU]
  Filled 2016-05-06: qty 5

## 2016-05-06 MED ORDER — SODIUM CHLORIDE 0.9 % IV SOLN
INTRAVENOUS | Status: DC
  Administered 2016-05-06: 12:00:00 via INTRAVENOUS

## 2016-05-06 MED ORDER — BIAFINE EX EMUL
CUTANEOUS | Status: DC | PRN
  Administered 2016-05-06: 11:00:00 via TOPICAL

## 2016-05-06 MED ORDER — SODIUM CHLORIDE 0.9% FLUSH
10.0000 mL | INTRAVENOUS | Status: DC | PRN
  Administered 2016-05-06: 10 mL
  Filled 2016-05-06: qty 10

## 2016-05-06 NOTE — Progress Notes (Signed)
   Weekly Management Note:  Outpatient    ICD-9-CM ICD-10-CM   1. 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  CBCT/MVCT images/Port film x-rays were reviewed.  The chart was checked.   Todd [name] presents for his 29th Fraction of radiation to his Left Tonsil and bilateral neck. He reports pain in his right shoulder today. He rates it a 8/10 when moving, and 2/10 when still. He thinks this is a muscle pain, and it started yesterday morning. He reports thick saliva, with feelings of gagging. He is using a scopalamine patch. His saliva is green/ yellow at times. He has vomited several times, and is using zofran for this. He is instilling 4 cans of osmolite daily with about 24 ounces of water. He is not eating anything orally due to his thick saliva. He is receiving his last chemotherapy today, and will receive IV Fluids everyday this week. He reports itching to the back of his neck. His skin is dry with some mild peeling noted. He does have one open area to the left anterior lower portion of his neck. I have given him a second tube of sonafine today, and some neosporin samples.He has some mild constipation but knows to take laxatives as needed. He has a scopolamine patch on which is not managing his nausea. This nausea affects his sleep, he is unable to sleep more than a few hours at a time.    Physical Findings:  height is 5\' 10"  (1.778 m) and weight is 157 lb 12.8 oz (71.6 kg). His temperature is 98.4 F (36.9 C).   Wt Readings from Last 3 Encounters:  05/06/16 157 lb 12.8 oz (71.6 kg)  04/28/16 167 lb (75.8 kg)  04/28/16 167 lb 4.8 oz (75.9 kg)   Weight loss of 10 lbs since 04/28/16. Very thick secretions in his oral cavity. No thrush noted. Difficult to see his oropharynx due to gag reflex. Erythema and dryness over his neck. No  obvious masses in his neck.   Impression:  The patient is tolerating radiotherapy.  Plan:  Continue radiotherapy as planned. The patient has been experiencing severe nausea. He will be receiving IV fluids every day this week and will ask for antiemetics with this. I have ordered a Yankauer suction device be delivered to his home to help manage secretions/nausea.   ________________________________   Todd [name], M.D.   This document serves as a record of services personally performed by Todd Gibson, MD. It was created on her behalf by Arlyce Harman, a trained medical scribe. The creation of this record is based on the scribe's personal observations and the provider's statements to them. This document has been checked and approved by the attending provider.

## 2016-05-06 NOTE — Progress Notes (Signed)
Mr. Todd [name] presents for his 29th Fraction of radiation to his Left Tonsil and bilateral neck. He reports pain in his right shoulder today. He rates it a 8/10 when moving, and 2/10 when still. He thinks this is a muscle pain, and it started yesterday morning. He reports thick saliva, with feelings of gagging. He is using a scopalamin patch. His saliva is green/ yellow at times. He has vomited several times, and is using zofran for this. He is instilling 4 cans of osmolite daily with about 24 ounces of water. He is not eating anything orally due to his thick saliva. He is receiving his last chemotherapy today, and will receive IV Fluids everyday this week. He reports itching to the back of his neck. His skin is dry with some mild peeling noted. He does have one open area to the left anterior lower portion of his neck. I have given him a second tube of sonafine today, and some neosporin samples.He has some mild constipation but knows to take laxatives as needed.  Orthostatics: BP sitting 129/73 pulse 85. BP standing 124/78 pulse 91.   Temp 98.4 F (36.9 C)   Ht 5\' 10"  (1.778 m)   Wt 157 lb 12.8 oz (71.6 kg)   BMI 22.64 kg/m    Wt Readings from Last 3 Encounters:  05/06/16 157 lb 12.8 oz (71.6 kg)  04/28/16 167 lb (75.8 kg)  04/28/16 167 lb 4.8 oz (75.9 kg)

## 2016-05-06 NOTE — Addendum Note (Signed)
Encounter addended by: Ernst Spell, RN on: 05/06/2016 10:31 AM<BR>    Actions taken: MAR administration accepted

## 2016-05-06 NOTE — Addendum Note (Signed)
Encounter addended by: Ernst Spell, RN on: 05/06/2016 10:07 AM<BR>    Actions taken: Visit diagnoses modified, Order list changed, Diagnosis association updated

## 2016-05-06 NOTE — Patient Instructions (Addendum)
[facility] Discharge Instructions for Patients Receiving Chemotherapy  Today you received the following chemotherapy agents: Cisplatin.  To help prevent nausea and vomiting after your treatment, we encourage you to take your nausea medication as prescribed. Make take Compazine starting today, and resume Zofran in 3 days.   If you develop nausea and vomiting that is not controlled by your nausea medication, call the clinic.   BELOW ARE SYMPTOMS THAT SHOULD BE REPORTED IMMEDIATELY:  *FEVER GREATER THAN 100.5 F  *CHILLS WITH OR WITHOUT FEVER  NAUSEA AND VOMITING THAT IS NOT CONTROLLED WITH YOUR NAUSEA MEDICATION  *UNUSUAL SHORTNESS OF BREATH  *UNUSUAL BRUISING OR BLEEDING  TENDERNESS IN MOUTH AND THROAT WITH OR WITHOUT PRESENCE OF ULCERS  *URINARY PROBLEMS  *BOWEL PROBLEMS  UNUSUAL RASH Items with * indicate a potential emergency and should be followed up as soon as possible.  Feel free to call the clinic you have any questions or concerns. The clinic phone number is [phone].  Please show the [facility] at check-in to the Emergency Department and triage nurse.  Increase fluids through tube feeding tonight.  Dehydration, Adult Dehydration is when there is not enough fluid or water in your body. This happens when you lose more fluids than you take in. Dehydration can range from mild to very bad. It should be treated right away to keep it from getting very bad. Symptoms of mild dehydration may include:  Thirst.  Dry lips.  Slightly dry mouth.  Dry, warm skin.  Dizziness. Symptoms of moderate dehydration may include:  Very dry mouth.  Muscle cramps.  Dark pee (urine). Pee may be the color of tea.  Your body making less pee.  Your eyes making fewer tears.  Heartbeat that is uneven or faster than normal (palpitations).  Headache.  Light-headedness, especially when you stand up from sitting.  Fainting (syncope). Symptoms of very  bad dehydration may include:  Changes in skin, such as:  Cold and clammy skin.  Blotchy (mottled) or pale skin.  Skin that does not quickly return to normal after being lightly pinched and let go (poor skin turgor).  Changes in body fluids, such as:  Feeling very thirsty.  Your eyes making fewer tears.  Not sweating when body temperature is high, such as in hot weather.  Your body making very little pee.  Changes in vital signs, such as:  Weak pulse.  Pulse that is more than 100 beats a minute when you are sitting still.  Fast breathing.  Low blood pressure.  Other changes, such as:  Sunken eyes.  Cold hands and feet.  Confusion.  Lack of energy (lethargy).  Trouble waking up from sleep.  Short-term weight loss.  Unconsciousness. Follow these instructions at home:  If told by your doctor, drink an ORS:  Make an ORS by using instructions on the package.  Start by drinking small amounts, about  cup (120 mL) every 5-10 minutes.  Slowly drink more until you have had the amount that your doctor said to have.  Drink enough clear fluid to keep your pee clear or pale yellow. If you were told to drink an ORS, finish the ORS first, then start slowly drinking clear fluids. Drink fluids such as:  Water. Do not drink only water by itself. Doing that can make the salt (sodium) level in your body get too low (hyponatremia).  Ice chips.  Fruit juice that you have added water to (diluted).  Low-calorie sports drinks.  Avoid:  Alcohol.  Drinks that have a lot of sugar. These include high-calorie sports drinks, fruit juice that does not have water added, and soda.  Caffeine.  Foods that are greasy or have a lot of fat or sugar.  Take over-the-counter and prescription medicines only as told by your doctor.  Do not take salt tablets. Doing that can make the salt level in your body get too high (hypernatremia).  Eat foods that have minerals (electrolytes).  Examples include bananas, oranges, potatoes, tomatoes, and spinach.  Keep all follow-up visits as told by your doctor. This is important. Contact a doctor if:  You have belly (abdominal) pain that:  Gets worse.  Stays in one area (localizes).  You have a rash.  You have a stiff neck.  You get angry or annoyed more easily than normal (irritability).  You are more sleepy than normal.  You have a harder time waking up than normal.  You feel:  Weak.  Dizzy.  Very thirsty.  You have peed (urinated) only a small amount of very dark pee during 6-8 hours. Get help right away if:  You have symptoms of very bad dehydration.  You cannot drink fluids without throwing up (vomiting).  Your symptoms get worse with treatment.  You have a fever.  You have a very bad headache.  You are throwing up or having watery poop (diarrhea) and it:  Gets worse.  Does not go away.  You have blood or something green (bile) in your throw-up.  You have blood in your poop (stool). This may cause poop to look black and tarry.  You have not peed in 6-8 hours.  You pass out (faint).  Your heart rate when you are sitting still is more than 100 beats a minute.  You have trouble breathing. This information is not intended to replace advice given to you by your health care provider. Make sure you discuss any questions you have with your health care provider. Document Released: 02/22/2009 Document Revised: 11/16/2015 Document Reviewed: 06/22/2015 Elsevier Interactive Patient Education  2017 [employer].

## 2016-05-06 NOTE — Progress Notes (Signed)
Dr. Alvy Bimler called on cell phone and reported patient has not voided since Cisplatin given today, has only voided 200 ml total today since in infusion area. Okay to be discharged today without voiding and instructed patient to increase fluid tonight through tube feeding, verbalized understanding.

## 2016-05-07 ENCOUNTER — Ambulatory Visit
Admission: RE | Admit: 2016-05-07 | Discharge: 2016-05-07 | Disposition: A | Discharge status: Home / Self Care | Payer: BLUE CROSS/BLUE SHIELD | Source: Ambulatory Visit | Attending: Radiation Oncology | Admitting: Radiation Oncology

## 2016-05-07 ENCOUNTER — Ambulatory Visit (HOSPITAL_BASED_OUTPATIENT_CLINIC_OR_DEPARTMENT_OTHER): Payer: BLUE CROSS/BLUE SHIELD | Admitting: Nurse Practitioner

## 2016-05-07 ENCOUNTER — Ambulatory Visit: Payer: BLUE CROSS/BLUE SHIELD | Admitting: Nutrition

## 2016-05-07 VITALS — BP 137/82 | HR 59 | Temp 97.7°F | Resp 18 | Ht 70.0 in | Wt 162.8 lb

## 2016-05-07 DIAGNOSIS — E860 Dehydration: Secondary | ICD-10-CM

## 2016-05-07 DIAGNOSIS — R1310 Dysphagia, unspecified: Secondary | ICD-10-CM | POA: Diagnosis not present

## 2016-05-07 DIAGNOSIS — C029 Malignant neoplasm of tongue, unspecified: Secondary | ICD-10-CM | POA: Diagnosis not present

## 2016-05-07 DIAGNOSIS — I10 Essential (primary) hypertension: Secondary | ICD-10-CM | POA: Diagnosis not present

## 2016-05-07 DIAGNOSIS — Z87891 Personal history of nicotine dependence: Secondary | ICD-10-CM | POA: Diagnosis not present

## 2016-05-07 DIAGNOSIS — C099 Malignant neoplasm of tonsil, unspecified: Secondary | ICD-10-CM

## 2016-05-07 DIAGNOSIS — E039 Hypothyroidism, unspecified: Secondary | ICD-10-CM | POA: Diagnosis not present

## 2016-05-07 DIAGNOSIS — C090 Malignant neoplasm of tonsillar fossa: Secondary | ICD-10-CM | POA: Diagnosis not present

## 2016-05-07 DIAGNOSIS — Z809 Family history of malignant neoplasm, unspecified: Secondary | ICD-10-CM | POA: Diagnosis not present

## 2016-05-07 DIAGNOSIS — M1990 Unspecified osteoarthritis, unspecified site: Secondary | ICD-10-CM | POA: Diagnosis not present

## 2016-05-07 DIAGNOSIS — Z79899 Other long term (current) drug therapy: Secondary | ICD-10-CM | POA: Diagnosis not present

## 2016-05-07 DIAGNOSIS — C801 Malignant (primary) neoplasm, unspecified: Secondary | ICD-10-CM | POA: Diagnosis not present

## 2016-05-07 DIAGNOSIS — Z7982 Long term (current) use of aspirin: Secondary | ICD-10-CM | POA: Diagnosis not present

## 2016-05-07 DIAGNOSIS — Z510 Encounter for antineoplastic radiation therapy: Secondary | ICD-10-CM | POA: Diagnosis not present

## 2016-05-07 DIAGNOSIS — E785 Hyperlipidemia, unspecified: Secondary | ICD-10-CM | POA: Diagnosis not present

## 2016-05-07 MED ORDER — OSMOLITE 1.5 CAL PO LIQD: ORAL | 0 refills | Status: DC

## 2016-05-07 MED ORDER — SODIUM CHLORIDE 0.9% FLUSH
10.0000 mL | INTRAVENOUS | Status: DC | PRN
  Administered 2016-05-07: 10 mL
  Filled 2016-05-07: qty 10

## 2016-05-07 MED ORDER — SODIUM CHLORIDE 0.9 % IV SOLN
1000.0000 mL | Freq: Once | INTRAVENOUS | Status: AC
  Administered 2016-05-07: 1000 mL via INTRAVENOUS

## 2016-05-07 MED ORDER — HEPARIN SOD (PORK) LOCK FLUSH 100 UNIT/ML IV SOLN
500.0000 [IU] | Freq: Once | INTRAVENOUS | Status: AC | PRN
  Administered 2016-05-07: 500 [IU]
  Filled 2016-05-07: qty 5

## 2016-05-07 NOTE — Progress Notes (Signed)
Nutrition follow-up completed with patient receiving IV fluids for tonsil cancer. Current weight documented as 162 pounds down from 167 pounds December 18. Patient only tolerating 3-4 cans of Osmolite 1.5 bolus daily. Patient has lost 18% of his starting weight and has severe malnutrition.  Estimated nutrition needs: 2225-2425 calories, 95-115 grams protein, 2.3 L fluid.  Nutrition diagnosis: Inadequate oral intake continues, severe malnutrition, continues.  Intervention: Will begin continuous tube feedings nocturnally using Osmolite 1.5 at 60 cc an hour for 12 hours with 120 cc free water before and after continuous feedings. Patient will continue one can Osmolite 1.5 bolus feeding 3 times a day with 120 cc free water before and after. Orders were written.  [facility] notified.  Patient and wife were educated and provided a copy of new tube feeding regimen. Questions were answered.  Teach back method was used.  6 cans Osmolite 1.5 provides 2130 cal, 89 g protein, and 2286 mL free water.  Monitoring, evaluation, goals: Patient will tolerate combination of continuous and bolus tube feeding to meet greater than 90% of estimated nutrition needs.  Next visit: Friday, December 29, during IV fluids.  **Disclaimer: This note was dictated with voice recognition software. Similar sounding words can inadvertently be transcribed and this note may contain transcription errors which may not have been corrected upon publication of note.**

## 2016-05-07 NOTE — Patient Instructions (Signed)
Dehydration, Adult Dehydration is a condition in which there is not enough fluid or water in the body. This happens when you lose more fluids than you take in. Important organs, such as the kidneys, brain, and heart, cannot function without a proper amount of fluids. Any loss of fluids from the body can lead to dehydration. Dehydration can range from mild to severe. This condition should be treated right away to prevent it from becoming severe. What are the causes? This condition may be caused by:  Vomiting.  Diarrhea.  Excessive sweating, such as from heat exposure or exercise.  Not drinking enough fluid, especially:  When ill.  While doing activity that requires a lot of energy.  Excessive urination.  Fever.  Infection.  Certain medicines, such as medicines that cause the body to lose excess fluid (diuretics).  Inability to access safe drinking water.  Reduced physical ability to get adequate water and food. What increases the risk? This condition is more likely to develop in people:  Who have a poorly controlled long-term (chronic) illness, such as diabetes, heart disease, or kidney disease.  Who are age 65 or older.  Who are disabled.  Who live in a place with high altitude.  Who play endurance sports. What are the signs or symptoms? Symptoms of mild dehydration may include:   Thirst.  Dry lips.  Slightly dry mouth.  Dry, warm skin.  Dizziness. Symptoms of moderate dehydration may include:   Very dry mouth.  Muscle cramps.  Dark urine. Urine may be the color of tea.  Decreased urine production.  Decreased tear production.  Heartbeat that is irregular or faster than normal (palpitations).  Headache.  Light-headedness, especially when you stand up from a sitting position.  Fainting (syncope). Symptoms of severe dehydration may include:   Changes in skin, such as:  Cold and clammy skin.  Blotchy (mottled) or pale skin.  Skin that does  not quickly return to normal after being lightly pinched and released (poor skin turgor).  Changes in body fluids, such as:  Extreme thirst.  No tear production.  Inability to sweat when body temperature is high, such as in hot weather.  Very little urine production.  Changes in vital signs, such as:  Weak pulse.  Pulse that is more than 100 beats a minute when sitting still.  Rapid breathing.  Low blood pressure.  Other changes, such as:  Sunken eyes.  Cold hands and feet.  Confusion.  Lack of energy (lethargy).  Difficulty waking up from sleep.  Short-term weight loss.  Unconsciousness. How is this diagnosed? This condition is diagnosed based on your symptoms and a physical exam. Blood and urine tests may be done to help confirm the diagnosis. How is this treated? Treatment for this condition depends on the severity. Mild or moderate dehydration can often be treated at home. Treatment should be started right away. Do not wait until dehydration becomes severe. Severe dehydration is an emergency and it needs to be treated in a hospital. Treatment for mild dehydration may include:   Drinking more fluids.  Replacing salts and minerals in your blood (electrolytes) that you may have lost. Treatment for moderate dehydration may include:   Drinking an oral rehydration solution (ORS). This is a drink that helps you replace fluids and electrolytes (rehydrate). It can be found at pharmacies and retail stores. Treatment for severe dehydration may include:   Receiving fluids through an IV tube.  Receiving an electrolyte solution through a feeding tube that is   passed through your nose and into your stomach (nasogastric tube, or NG tube).  Correcting any abnormalities in electrolytes.  Treating the underlying cause of dehydration. Follow these instructions at home:  If directed by your health care provider, drink an ORS:  Make an ORS by following instructions on the  package.  Start by drinking small amounts, about  cup (120 mL) every 5-10 minutes.  Slowly increase how much you drink until you have taken the amount recommended by your health care provider.  Drink enough clear fluid to keep your urine clear or pale yellow. If you were told to drink an ORS, finish the ORS first, then start slowly drinking other clear fluids. Drink fluids such as:  Water. Do not drink only water. Doing that can lead to having too little salt (sodium) in the body (hyponatremia).  Ice chips.  Fruit juice that you have added water to (diluted fruit juice).  Low-calorie sports drinks.  Avoid:  Alcohol.  Drinks that contain a lot of sugar. These include high-calorie sports drinks, fruit juice that is not diluted, and soda.  Caffeine.  Foods that are greasy or contain a lot of fat or sugar.  Take over-the-counter and prescription medicines only as told by your health care provider.  Do not take sodium tablets. This can lead to having too much sodium in the body (hypernatremia).  Eat foods that contain a healthy balance of electrolytes, such as bananas, oranges, potatoes, tomatoes, and spinach.  Keep all follow-up visits as told by your health care provider. This is important. Contact a health care provider if:  You have abdominal pain that:  Gets worse.  Stays in one area (localizes).  You have a rash.  You have a stiff neck.  You are more irritable than usual.  You are sleepier or more difficult to wake up than usual.  You feel weak or dizzy.  You feel very thirsty.  You have urinated only a small amount of very dark urine over 6-8 hours. Get help right away if:  You have symptoms of severe dehydration.  You cannot drink fluids without vomiting.  Your symptoms get worse with treatment.  You have a fever.  You have a severe headache.  You have vomiting or diarrhea that:  Gets worse.  Does not go away.  You have blood or green matter  (bile) in your vomit.  You have blood in your stool. This may cause stool to look black and tarry.  You have not urinated in 6-8 hours.  You faint.  Your heart rate while sitting still is over 100 beats a minute.  You have trouble breathing. This information is not intended to replace advice given to you by your health care provider. Make sure you discuss any questions you have with your health care provider. Document Released: 04/28/2005 Document Revised: 11/23/2015 Document Reviewed: 06/22/2015 Elsevier Interactive Patient Education  2017 Elsevier Inc.  

## 2016-05-08 ENCOUNTER — Encounter: Payer: Self-pay | Admitting: *Deleted

## 2016-05-08 ENCOUNTER — Ambulatory Visit (HOSPITAL_BASED_OUTPATIENT_CLINIC_OR_DEPARTMENT_OTHER): Payer: BLUE CROSS/BLUE SHIELD | Admitting: Hematology and Oncology

## 2016-05-08 ENCOUNTER — Ambulatory Visit (HOSPITAL_BASED_OUTPATIENT_CLINIC_OR_DEPARTMENT_OTHER): Payer: BLUE CROSS/BLUE SHIELD

## 2016-05-08 ENCOUNTER — Encounter: Payer: Self-pay | Admitting: Hematology and Oncology

## 2016-05-08 ENCOUNTER — Ambulatory Visit
Admission: RE | Admit: 2016-05-08 | Discharge: 2016-05-08 | Disposition: A | Discharge status: Home / Self Care | Payer: BLUE CROSS/BLUE SHIELD | Source: Ambulatory Visit | Attending: Radiation Oncology | Admitting: Radiation Oncology

## 2016-05-08 VITALS — BP 141/81 | HR 58 | Temp 98.8°F | Resp 16

## 2016-05-08 DIAGNOSIS — E785 Hyperlipidemia, unspecified: Secondary | ICD-10-CM | POA: Diagnosis not present

## 2016-05-08 DIAGNOSIS — R634 Abnormal weight loss: Secondary | ICD-10-CM

## 2016-05-08 DIAGNOSIS — C099 Malignant neoplasm of tonsil, unspecified: Secondary | ICD-10-CM

## 2016-05-08 DIAGNOSIS — D702 Other drug-induced agranulocytosis: Secondary | ICD-10-CM | POA: Diagnosis not present

## 2016-05-08 DIAGNOSIS — Z7982 Long term (current) use of aspirin: Secondary | ICD-10-CM | POA: Diagnosis not present

## 2016-05-08 DIAGNOSIS — T451X5A Adverse effect of antineoplastic and immunosuppressive drugs, initial encounter: Secondary | ICD-10-CM

## 2016-05-08 DIAGNOSIS — M1990 Unspecified osteoarthritis, unspecified site: Secondary | ICD-10-CM | POA: Diagnosis not present

## 2016-05-08 DIAGNOSIS — D6481 Anemia due to antineoplastic chemotherapy: Secondary | ICD-10-CM

## 2016-05-08 DIAGNOSIS — M1991 Primary osteoarthritis, unspecified site: Secondary | ICD-10-CM | POA: Diagnosis not present

## 2016-05-08 DIAGNOSIS — Z510 Encounter for antineoplastic radiation therapy: Secondary | ICD-10-CM | POA: Diagnosis not present

## 2016-05-08 DIAGNOSIS — R1310 Dysphagia, unspecified: Secondary | ICD-10-CM | POA: Diagnosis not present

## 2016-05-08 DIAGNOSIS — E860 Dehydration: Secondary | ICD-10-CM | POA: Diagnosis not present

## 2016-05-08 DIAGNOSIS — Z87891 Personal history of nicotine dependence: Secondary | ICD-10-CM | POA: Diagnosis not present

## 2016-05-08 DIAGNOSIS — I10 Essential (primary) hypertension: Secondary | ICD-10-CM | POA: Diagnosis not present

## 2016-05-08 DIAGNOSIS — C090 Malignant neoplasm of tonsillar fossa: Secondary | ICD-10-CM | POA: Diagnosis not present

## 2016-05-08 DIAGNOSIS — R05 Cough: Secondary | ICD-10-CM

## 2016-05-08 DIAGNOSIS — Z79899 Other long term (current) drug therapy: Secondary | ICD-10-CM | POA: Diagnosis not present

## 2016-05-08 DIAGNOSIS — R059 Cough, unspecified: Secondary | ICD-10-CM

## 2016-05-08 DIAGNOSIS — Z431 Encounter for attention to gastrostomy: Secondary | ICD-10-CM | POA: Diagnosis not present

## 2016-05-08 DIAGNOSIS — E039 Hypothyroidism, unspecified: Secondary | ICD-10-CM | POA: Diagnosis not present

## 2016-05-08 DIAGNOSIS — Z809 Family history of malignant neoplasm, unspecified: Secondary | ICD-10-CM | POA: Diagnosis not present

## 2016-05-08 MED ORDER — HEPARIN SOD (PORK) LOCK FLUSH 100 UNIT/ML IV SOLN
500.0000 [IU] | Freq: Once | INTRAVENOUS | Status: DC | PRN
  Filled 2016-05-08: qty 5

## 2016-05-08 MED ORDER — SODIUM CHLORIDE 0.9 % IV SOLN
Freq: Once | INTRAVENOUS | Status: AC
  Administered 2016-05-08: 10:00:00 via INTRAVENOUS

## 2016-05-08 MED ORDER — DEXAMETHASONE SODIUM PHOSPHATE 10 MG/ML IJ SOLN
10.0000 mg | Freq: Once | INTRAMUSCULAR | Status: AC
  Administered 2016-05-08: 10 mg via INTRAVENOUS

## 2016-05-08 MED ORDER — SODIUM CHLORIDE 0.9% FLUSH
10.0000 mL | Freq: Once | INTRAVENOUS | Status: AC
  Administered 2016-05-08: 10 mL
  Filled 2016-05-08: qty 10

## 2016-05-08 MED ORDER — DEXAMETHASONE SODIUM PHOSPHATE 10 MG/ML IJ SOLN
INTRAMUSCULAR | Status: AC
  Filled 2016-05-08: qty 1

## 2016-05-08 MED ORDER — SODIUM CHLORIDE 0.9% FLUSH
10.0000 mL | INTRAVENOUS | Status: DC | PRN
  Administered 2016-05-08: 10 mL
  Filled 2016-05-08: qty 10

## 2016-05-08 MED ORDER — HEPARIN SOD (PORK) LOCK FLUSH 100 UNIT/ML IV SOLN
500.0000 [IU] | Freq: Once | INTRAVENOUS | Status: AC
  Administered 2016-05-08: 500 [IU]
  Filled 2016-05-08: qty 5

## 2016-05-08 MED ORDER — ALTEPLASE 2 MG IJ SOLR
2.0000 mg | Freq: Once | INTRAMUSCULAR | Status: DC | PRN
  Filled 2016-05-08: qty 2

## 2016-05-08 NOTE — Assessment & Plan Note (Signed)
He continues to have progressive weight loss. He is not able to tolerate more than 4 cans of nutritional supplement per day due to early satiety and significant mucus production I recommend reduce water flushes through the feeding tube and come in daily for IV fluid support along with scopolamine patch to dry mucus production and he agrees I want him potential implication of delayed healing and possibility of not able to receive further chemotherapy due to significant pancytopenia with progressive weight loss He will try to increase feeding/ intake as tolerated He is started on continuous feeding which I think would help him with weight gain I recommend the patient to try to increase oral intake especially liquid by mouth if possible

## 2016-05-08 NOTE — Assessment & Plan Note (Signed)
He has completed all chemotherapy He is experiencing worsening side effects from treatment. I will continue to see him on a weekly basis for supportive care

## 2016-05-08 NOTE — Assessment & Plan Note (Signed)
This is likely due to recent treatment. The patient denies recent history of bleeding such as epistaxis, hematuria or hematochezia. He is asymptomatic from the anemia. I will observe for now.    

## 2016-05-08 NOTE — Patient Instructions (Signed)
Dehydration, Adult Dehydration is when there is not enough fluid or water in your body. This happens when you lose more fluids than you take in. Dehydration can range from mild to very bad. It should be treated right away to keep it from getting very bad. Symptoms of mild dehydration may include:   Thirst.  Dry lips.  Slightly dry mouth.  Dry, warm skin.  Dizziness. Symptoms of moderate dehydration may include:   Very dry mouth.  Muscle cramps.  Dark pee (urine). Pee may be the color of tea.  Your body making less pee.  Your eyes making fewer tears.  Heartbeat that is uneven or faster than normal (palpitations).  Headache.  Light-headedness, especially when you stand up from sitting.  Fainting (syncope). Symptoms of very bad dehydration may include:   Changes in skin, such as:  Cold and clammy skin.  Blotchy (mottled) or pale skin.  Skin that does not quickly return to normal after being lightly pinched and let go (poor skin turgor).  Changes in body fluids, such as:  Feeling very thirsty.  Your eyes making fewer tears.  Not sweating when body temperature is high, such as in hot weather.  Your body making very little pee.  Changes in vital signs, such as:  Weak pulse.  Pulse that is more than 100 beats a minute when you are sitting still.  Fast breathing.  Low blood pressure.  Other changes, such as:  Sunken eyes.  Cold hands and feet.  Confusion.  Lack of energy (lethargy).  Trouble waking up from sleep.  Short-term weight loss.  Unconsciousness. Follow these instructions at home:  If told by your doctor, drink an ORS:  Make an ORS by using instructions on the package.  Start by drinking small amounts, about  cup (120 mL) every 5-10 minutes.  Slowly drink more until you have had the amount that your doctor said to have.  Drink enough clear fluid to keep your pee clear or pale yellow. If you were told to drink an ORS, finish the  ORS first, then start slowly drinking clear fluids. Drink fluids such as:  Water. Do not drink only water by itself. Doing that can make the salt (sodium) level in your body get too low (hyponatremia).  Ice chips.  Fruit juice that you have added water to (diluted).  Low-calorie sports drinks.  Avoid:  Alcohol.  Drinks that have a lot of sugar. These include high-calorie sports drinks, fruit juice that does not have water added, and soda.  Caffeine.  Foods that are greasy or have a lot of fat or sugar.  Take over-the-counter and prescription medicines only as told by your doctor.  Do not take salt tablets. Doing that can make the salt level in your body get too high (hypernatremia).  Eat foods that have minerals (electrolytes). Examples include bananas, oranges, potatoes, tomatoes, and spinach.  Keep all follow-up visits as told by your doctor. This is important. Contact a doctor if:  You have belly (abdominal) pain that:  Gets worse.  Stays in one area (localizes).  You have a rash.  You have a stiff neck.  You get angry or annoyed more easily than normal (irritability).  You are more sleepy than normal.  You have a harder time waking up than normal.  You feel:  Weak.  Dizzy.  Very thirsty.  You have peed (urinated) only a small amount of very dark pee during 6-8 hours. Get help right away if:  You   have symptoms of very bad dehydration.  You cannot drink fluids without throwing up (vomiting).  Your symptoms get worse with treatment.  You have a fever.  You have a very bad headache.  You are throwing up or having watery poop (diarrhea) and it:  Gets worse.  Does not go away.  You have blood or something green (bile) in your throw-up.  You have blood in your poop (stool). This may cause poop to look black and tarry.  You have not peed in 6-8 hours.  You pass out (faint).  Your heart rate when you are sitting still is more than 100 beats a  minute.  You have trouble breathing. This information is not intended to replace advice given to you by your health care provider. Make sure you discuss any questions you have with your health care provider. Document Released: 02/22/2009 Document Revised: 11/16/2015 Document Reviewed: 06/22/2015 Elsevier Interactive Patient Education  2017 Elsevier Inc.  

## 2016-05-08 NOTE — Assessment & Plan Note (Signed)
This is likely due to recent treatment. The patient denies recent history of fevers, cough, chills, diarrhea or dysuria. He is asymptomatic from the leukopenia. I will observe for now.  

## 2016-05-08 NOTE — Progress Notes (Signed)
Oncology Nurse Navigator Documentation  Received from Mr. Schoenfeld's wife 'Part 3 -Doctor's Information' for his Initial Claim For [employer].  Completed sections as able, directed to Dr. Isidore Moos for completion and signature which were completed.    Gayleen Orem, RN, BSN, [place] [facility] Nurse [place] [facility] [place] [phone]

## 2016-05-08 NOTE — Assessment & Plan Note (Signed)
His cough is triggered by excessive oropharyngeal secretions which is unknown side effects from treatment. He had completed antibiotic therapy. He is using suction device to clear the secretion.

## 2016-05-08 NOTE — Progress Notes (Signed)
Oncology Nurse Navigator Documentation  To provide support, encouragement and care continuity, met with Todd [name] and his wife during during Established Patient appt with Dr. Alvy Bimler. He reported:  Noncompliance with HEP as instructed by Garald Balding, SLP.  He voiced understanding of importance of exercises to prevent dysphagia, indicated he will reinstate practice.  BM yesterday after 3 days.  He was encouraged to take daily dose of Miralax to avoid constipation, voiced understanding.  Denied need to take analgesics.  Has been using Scopolamine for 4 days now, finding relief from excessive secretions.  Using PEG exclusively for hydration and nutrition.  Began using feeding pump last HS with goal of 3 cans/night; bolus feeding 3 cans during day, including water.  Suction machine delivered yesterday to help with secretion mgt. I provided Mrs. Soltis Disability support information. They understand I can be contacted with needs/concerns.  Gayleen Orem, RN, BSN, [place] [facility] Nurse [facility] [place] [phone]

## 2016-05-08 NOTE — Progress Notes (Signed)
[place] OFFICE PROGRESS NOTE  Patient Care Team: Haywood Pao, MD as PCP - General (Internal Medicine) Leota Sauers, RN as Oncology Nurse Navigator Eppie Gibson, MD as Attending Physician (Radiation Oncology) Heath Lark, MD as Consulting Physician (Hematology and Oncology) Karie Mainland, RD as Dietitian (Nutrition)  SUMMARY OF ONCOLOGIC HISTORY:   Tonsil cancer ([place])   02/07/2016 Imaging    Ct neck showed abnormal soft tissue within the left pharynx, extending from the left tonsillar pillar inferiorly to the level of the hyoid and filling the left vallecula. The appearance is most consistent with a neoplastic process. 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 Histologic sampling and direct visualization should be considered. Confluent adenopathy within the left cervical chain with areas of central hypoattenuation suggesting degrees of necrosis. This is most suggestive of metastatic spread. The areas of necrosis within the adenopathy combined with the above-described pharyngeal soft tissue mass are together suggestive of squamous cell carcinoma.      02/14/2016 Procedure    He has FNA of left neck LN       02/14/2016 Pathology Results    [id number] cytology confirmed squamous cell carcinoma      02/26/2016 PET scan    Hypermetabolic left tonsillar in pharyngeal mass with hypermetabolic left sided station IA, station III, and station Ib adenopathy and hypermetabolic station IIa adenopathy on the right. Borderline hypermetabolic small right station IIb lymph node. Several tiny lung nodules are observed, these are not hypermetabolic but are also below the threshold level for accurate characterization. Dedicated CT chest may be warranted to act as a baseline.      03/20/2016 Procedure    Placement of a right internal jugular approach power injectable Port-A-Cath. Fluoroscopic insertion of a 20-French pull-through gastrostomy tube.       03/24/2016 - 05/06/2016 Chemotherapy    He received high dose cisplatin; dose 2 and 3 with dose reduction due to side-effects      03/24/2016 -  Radiation Therapy    He received concurrent radiation       INTERVAL HISTORY: Please see below for problem oriented charting. He is seen as part of his weekly supportive care visit. He continues to have progressive weight loss. He is recently started on infusion feeding pump. The patient started to use suction device for secretion and has not eaten or drink anything by mouth for over a week. He denies recent hemoptysis. Denies worsening pain. He is mildly constipated, resolved with laxatives. Denies nausea  REVIEW OF SYSTEMS:   Constitutional: Denies fevers, chills  Eyes: Denies blurriness of vision Respiratory: Denies cough, dyspnea or wheezes Cardiovascular: Denies palpitation, chest discomfort or lower extremity swelling Skin: Denies abnormal skin rashes Lymphatics: Denies new lymphadenopathy or easy bruising Neurological:Denies numbness, tingling or new weaknesses Behavioral/Psych: Mood is stable, no new changes  All other systems were reviewed with the patient and are negative.  I have reviewed the past medical history, past surgical history, social history and family history with the patient and they are unchanged from previous note.  ALLERGIES:  is allergic to phenergan [promethazine hcl].  MEDICATIONS:  Current Outpatient Prescriptions  Medication Sig Dispense Refill  . acetaminophen (TYLENOL) 650 MG CR tablet Take 1,300 mg by mouth.    . fenofibrate (TRICOR) 145 MG tablet     . GLUCOSAMINE SULFATE PO Take by mouth.    . lactulose (CHRONULAC) 10 GM/15ML solution Place 15 mLs (10 g total) into feeding tube 3 (three) times daily.  240 mL 0  . levothyroxine (SYNTHROID, LEVOTHROID) 50 MCG tablet     . lidocaine (XYLOCAINE) 2 % solution Patient: Mix 1part 2% viscous lidocaine, 1part H20. Swish/swallow 45mL of this mixture, 29min  before meals and at bedtime, up to QID 200 mL 3  . lidocaine-prilocaine (EMLA) cream Apply to affected area once 30 g 3  . Nutritional Supplements (FEEDING SUPPLEMENT, OSMOLITE 1.5 CAL,) LIQD Give 1 can Osmolite 1.5 Via PEG TID with 120 cc free water bolus at 0900, 1200, 1500. Begin osmolite 1.5 at 6 pm at 60 cc/hr and run for 12 hours continuously. Flush with 120 cc free water before and after continuous feeding. Send pump and supplies for continuous feeding. 1422 mL 0  . ondansetron (ZOFRAN) 8 MG tablet Take 1 tablet (8 mg total) by mouth 2 (two) times daily as needed. Start on the third day after chemotherapy. 30 tablet 1  . polyethylene glycol (MIRALAX) packet Take 17 g by mouth daily. 14 each 0  . prochlorperazine (COMPAZINE) 10 MG tablet Take 1 tablet (10 mg total) by mouth every 6 (six) hours as needed (Nausea or vomiting). 30 tablet 1  . scopolamine (TRANSDERM-SCOP) 1 MG/3DAYS Place 1 patch (1.5 mg total) onto the skin every 3 (three) days. 10 patch 12  . sodium fluoride (FLUORISHIELD) 1.1 % GEL dental gel Instill one drop of gel per tooth space of fluoride tray. Place over teeth for 5 minutes. Remove. Spit out excess. Repeat nightly. 120 mL prn  . fentaNYL (DURAGESIC - DOSED MCG/HR) 25 MCG/HR patch Place 1 patch (25 mcg total) onto the skin every 3 (three) days. (Patient not taking: Reported on 05/08/2016) 5 patch 0  . HYDROcodone-acetaminophen (NORCO) 10-325 MG tablet TAKE 1 TABLET BY MOUTH EVERY 4-6 HOURS AS NEEDED FOR PAIN  0  . morphine (ROXANOL) 20 MG/ML concentrated solution Take 0.5 mLs (10 mg total) by mouth every 2 (two) hours as needed for severe pain. (Patient not taking: Reported on 05/08/2016) 120 mL 0  . Multiple Vitamin (MULTIVITAMIN) capsule Take by mouth.     No current facility-administered medications for this visit.     PHYSICAL EXAMINATION: ECOG PERFORMANCE STATUS: 1 - Symptomatic but completely ambulatory  Vitals:   05/08/16 0847  BP: 121/75  Pulse: (!) 57  Resp:  17  Temp: 97.8 F (36.6 C)   Filed Weights   05/08/16 0847  Weight: 164 lb 4.8 oz (74.5 kg)    GENERAL:alert, no distress and comfortable SKIN: Noted dry skin and skin rash around his neck EYES: normal, Conjunctiva are pink and non-injected, sclera clear OROPHARYNX: Noted mucositis. No thrush NECK: supple, thyroid normal size, non-tender, without nodularity LYMPH:  no palpable lymphadenopathy in the cervical, axillary or inguinal LUNGS: clear to auscultation and percussion with normal breathing effort HEART: regular rate & rhythm and no murmurs and no lower extremity edema ABDOMEN:abdomen soft, non-tender and normal bowel sounds. Feeding tube site looks okay Musculoskeletal:no cyanosis of digits and no clubbing  NEURO: alert & oriented x 3 with fluent speech, no focal motor/sensory deficits  LABORATORY DATA:  I have reviewed the data as listed    Component Value Date/Time   NA 140 05/02/2016 0836   K 4.2 05/02/2016 0836   CL 105 03/20/2016 1200   CO2 29 05/02/2016 0836   GLUCOSE 131 05/02/2016 0836   BUN 22.2 05/02/2016 0836   CREATININE 1.1 05/02/2016 0836   CALCIUM 10.4 05/02/2016 0836   PROT 7.1 05/02/2016 0836   ALBUMIN 3.5 05/02/2016 0836  AST 13 05/02/2016 0836   ALT 13 05/02/2016 0836   ALKPHOS 52 05/02/2016 0836   BILITOT 0.38 05/02/2016 0836   GFRNONAA >60 03/20/2016 1200   GFRAA >60 03/20/2016 1200    No results found for: SPEP, UPEP  Lab Results  Component Value Date   WBC 2.8 (L) 05/02/2016   NEUTROABS 2.1 05/02/2016   HGB 10.9 (L) 05/02/2016   HCT 32.8 (L) 05/02/2016   MCV 88.6 05/02/2016   PLT 447 (H) 05/02/2016      Chemistry      Component Value Date/Time   NA 140 05/02/2016 0836   K 4.2 05/02/2016 0836   CL 105 03/20/2016 1200   CO2 29 05/02/2016 0836   BUN 22.2 05/02/2016 0836   CREATININE 1.1 05/02/2016 0836      Component Value Date/Time   CALCIUM 10.4 05/02/2016 0836   ALKPHOS 52 05/02/2016 0836   AST 13 05/02/2016 0836   ALT  13 05/02/2016 0836   BILITOT 0.38 05/02/2016 0836      ASSESSMENT & PLAN:  Tonsil cancer ([place]) He has completed all chemotherapy He is experiencing worsening side effects from treatment. I will continue to see him on a weekly basis for supportive care  Anemia due to antineoplastic chemotherapy This is likely due to recent treatment. The patient denies recent history of bleeding such as epistaxis, hematuria or hematochezia. He is asymptomatic from the anemia. I will observe for now.    Drug-induced neutropenia ([place]) This is likely due to recent treatment. The patient denies recent history of fevers, cough, chills, diarrhea or dysuria. He is asymptomatic from the leukopenia. I will observe for now.   Cough His cough is triggered by excessive oropharyngeal secretions which is unknown side effects from treatment. He had completed antibiotic therapy. He is using suction device to clear the secretion.  Weight loss He continues to have progressive weight loss. He is not able to tolerate more than 4 cans of nutritional supplement per day due to early satiety and significant mucus production I recommend reduce water flushes through the feeding tube and come in daily for IV fluid support along with scopolamine patch to dry mucus production and he agrees I want him potential implication of delayed healing and possibility of not able to receive further chemotherapy due to significant pancytopenia with progressive weight loss He will try to increase feeding/ intake as tolerated He is started on continuous feeding which I think would help him with weight gain I recommend the patient to try to increase oral intake especially liquid by mouth if possible   No orders of the defined types were placed in this encounter.  All questions were answered. The patient knows to call the clinic with any problems, questions or concerns. No barriers to learning was detected. I spent 20 minutes counseling the  patient face to face. The total time spent in the appointment was 25 minutes and more than 50% was on counseling and review of test results     Heath Lark, MD 05/08/2016 3:48 PM

## 2016-05-09 ENCOUNTER — Ambulatory Visit (HOSPITAL_BASED_OUTPATIENT_CLINIC_OR_DEPARTMENT_OTHER): Payer: BLUE CROSS/BLUE SHIELD

## 2016-05-09 ENCOUNTER — Ambulatory Visit: Payer: BLUE CROSS/BLUE SHIELD | Admitting: Nutrition

## 2016-05-09 ENCOUNTER — Ambulatory Visit
Admission: RE | Admit: 2016-05-09 | Discharge: 2016-05-09 | Disposition: A | Discharge status: Home / Self Care | Payer: BLUE CROSS/BLUE SHIELD | Source: Ambulatory Visit | Attending: Radiation Oncology | Admitting: Radiation Oncology

## 2016-05-09 VITALS — BP 138/82 | HR 63 | Temp 98.9°F | Resp 18

## 2016-05-09 DIAGNOSIS — E785 Hyperlipidemia, unspecified: Secondary | ICD-10-CM | POA: Diagnosis not present

## 2016-05-09 DIAGNOSIS — Z79899 Other long term (current) drug therapy: Secondary | ICD-10-CM | POA: Diagnosis not present

## 2016-05-09 DIAGNOSIS — E860 Dehydration: Secondary | ICD-10-CM | POA: Diagnosis not present

## 2016-05-09 DIAGNOSIS — R1310 Dysphagia, unspecified: Secondary | ICD-10-CM | POA: Diagnosis not present

## 2016-05-09 DIAGNOSIS — Z809 Family history of malignant neoplasm, unspecified: Secondary | ICD-10-CM | POA: Diagnosis not present

## 2016-05-09 DIAGNOSIS — Z510 Encounter for antineoplastic radiation therapy: Secondary | ICD-10-CM | POA: Diagnosis not present

## 2016-05-09 DIAGNOSIS — C099 Malignant neoplasm of tonsil, unspecified: Secondary | ICD-10-CM | POA: Diagnosis not present

## 2016-05-09 DIAGNOSIS — E039 Hypothyroidism, unspecified: Secondary | ICD-10-CM | POA: Diagnosis not present

## 2016-05-09 DIAGNOSIS — Z87891 Personal history of nicotine dependence: Secondary | ICD-10-CM | POA: Diagnosis not present

## 2016-05-09 DIAGNOSIS — C090 Malignant neoplasm of tonsillar fossa: Secondary | ICD-10-CM | POA: Diagnosis not present

## 2016-05-09 DIAGNOSIS — Z7982 Long term (current) use of aspirin: Secondary | ICD-10-CM | POA: Diagnosis not present

## 2016-05-09 DIAGNOSIS — I10 Essential (primary) hypertension: Secondary | ICD-10-CM | POA: Diagnosis not present

## 2016-05-09 DIAGNOSIS — M1990 Unspecified osteoarthritis, unspecified site: Secondary | ICD-10-CM | POA: Diagnosis not present

## 2016-05-09 MED ORDER — ONDANSETRON HCL 40 MG/20ML IJ SOLN: Freq: Once | INTRAMUSCULAR | Status: DC

## 2016-05-09 MED ORDER — SODIUM CHLORIDE 0.9 % IV SOLN
Freq: Once | INTRAVENOUS | Status: AC
  Administered 2016-05-09: 09:00:00 via INTRAVENOUS

## 2016-05-09 MED ORDER — HEPARIN SOD (PORK) LOCK FLUSH 100 UNIT/ML IV SOLN
500.0000 [IU] | Freq: Once | INTRAVENOUS | Status: AC | PRN
  Administered 2016-05-09: 500 [IU]
  Filled 2016-05-09: qty 5

## 2016-05-09 MED ORDER — DEXAMETHASONE SODIUM PHOSPHATE 10 MG/ML IJ SOLN
10.0000 mg | Freq: Once | INTRAMUSCULAR | Status: AC
  Administered 2016-05-09: 10 mg via INTRAVENOUS

## 2016-05-09 MED ORDER — DEXAMETHASONE SODIUM PHOSPHATE 10 MG/ML IJ SOLN
INTRAMUSCULAR | Status: AC
  Filled 2016-05-09: qty 1

## 2016-05-09 MED ORDER — ONDANSETRON HCL 4 MG/2ML IJ SOLN
INTRAMUSCULAR | Status: AC
  Filled 2016-05-09: qty 4

## 2016-05-09 MED ORDER — ONDANSETRON HCL 4 MG/2ML IJ SOLN
8.0000 mg | Freq: Once | INTRAMUSCULAR | Status: AC
  Administered 2016-05-09: 8 mg via INTRAVENOUS

## 2016-05-09 MED ORDER — SODIUM CHLORIDE 0.9% FLUSH
10.0000 mL | INTRAVENOUS | Status: DC | PRN
  Administered 2016-05-09: 10 mL
  Filled 2016-05-09: qty 10

## 2016-05-09 NOTE — Progress Notes (Signed)
Patient ambulates well at discharge with no signs of dizziness noted.  Post vital signs stable.

## 2016-05-09 NOTE — Patient Instructions (Signed)
Dehydration, Adult Dehydration is a condition in which there is not enough fluid or water in the body. This happens when you lose more fluids than you take in. Important organs, such as the kidneys, brain, and heart, cannot function without a proper amount of fluids. Any loss of fluids from the body can lead to dehydration. Dehydration can range from mild to severe. This condition should be treated right away to prevent it from becoming severe. What are the causes? This condition may be caused by:  Vomiting.  Diarrhea.  Excessive sweating, such as from heat exposure or exercise.  Not drinking enough fluid, especially:  When ill.  While doing activity that requires a lot of energy.  Excessive urination.  Fever.  Infection.  Certain medicines, such as medicines that cause the body to lose excess fluid (diuretics).  Inability to access safe drinking water.  Reduced physical ability to get adequate water and food. What increases the risk? This condition is more likely to develop in people:  Who have a poorly controlled long-term (chronic) illness, such as diabetes, heart disease, or kidney disease.  Who are age 65 or older.  Who are disabled.  Who live in a place with high altitude.  Who play endurance sports. What are the signs or symptoms? Symptoms of mild dehydration may include:   Thirst.  Dry lips.  Slightly dry mouth.  Dry, warm skin.  Dizziness. Symptoms of moderate dehydration may include:   Very dry mouth.  Muscle cramps.  Dark urine. Urine may be the color of tea.  Decreased urine production.  Decreased tear production.  Heartbeat that is irregular or faster than normal (palpitations).  Headache.  Light-headedness, especially when you stand up from a sitting position.  Fainting (syncope). Symptoms of severe dehydration may include:   Changes in skin, such as:  Cold and clammy skin.  Blotchy (mottled) or pale skin.  Skin that does  not quickly return to normal after being lightly pinched and released (poor skin turgor).  Changes in body fluids, such as:  Extreme thirst.  No tear production.  Inability to sweat when body temperature is high, such as in hot weather.  Very little urine production.  Changes in vital signs, such as:  Weak pulse.  Pulse that is more than 100 beats a minute when sitting still.  Rapid breathing.  Low blood pressure.  Other changes, such as:  Sunken eyes.  Cold hands and feet.  Confusion.  Lack of energy (lethargy).  Difficulty waking up from sleep.  Short-term weight loss.  Unconsciousness. How is this diagnosed? This condition is diagnosed based on your symptoms and a physical exam. Blood and urine tests may be done to help confirm the diagnosis. How is this treated? Treatment for this condition depends on the severity. Mild or moderate dehydration can often be treated at home. Treatment should be started right away. Do not wait until dehydration becomes severe. Severe dehydration is an emergency and it needs to be treated in a hospital. Treatment for mild dehydration may include:   Drinking more fluids.  Replacing salts and minerals in your blood (electrolytes) that you may have lost. Treatment for moderate dehydration may include:   Drinking an oral rehydration solution (ORS). This is a drink that helps you replace fluids and electrolytes (rehydrate). It can be found at pharmacies and retail stores. Treatment for severe dehydration may include:   Receiving fluids through an IV tube.  Receiving an electrolyte solution through a feeding tube that is   passed through your nose and into your stomach (nasogastric tube, or NG tube).  Correcting any abnormalities in electrolytes.  Treating the underlying cause of dehydration. Follow these instructions at home:  If directed by your health care provider, drink an ORS:  Make an ORS by following instructions on the  package.  Start by drinking small amounts, about  cup (120 mL) every 5-10 minutes.  Slowly increase how much you drink until you have taken the amount recommended by your health care provider.  Drink enough clear fluid to keep your urine clear or pale yellow. If you were told to drink an ORS, finish the ORS first, then start slowly drinking other clear fluids. Drink fluids such as:  Water. Do not drink only water. Doing that can lead to having too little salt (sodium) in the body (hyponatremia).  Ice chips.  Fruit juice that you have added water to (diluted fruit juice).  Low-calorie sports drinks.  Avoid:  Alcohol.  Drinks that contain a lot of sugar. These include high-calorie sports drinks, fruit juice that is not diluted, and soda.  Caffeine.  Foods that are greasy or contain a lot of fat or sugar.  Take over-the-counter and prescription medicines only as told by your health care provider.  Do not take sodium tablets. This can lead to having too much sodium in the body (hypernatremia).  Eat foods that contain a healthy balance of electrolytes, such as bananas, oranges, potatoes, tomatoes, and spinach.  Keep all follow-up visits as told by your health care provider. This is important. Contact a health care provider if:  You have abdominal pain that:  Gets worse.  Stays in one area (localizes).  You have a rash.  You have a stiff neck.  You are more irritable than usual.  You are sleepier or more difficult to wake up than usual.  You feel weak or dizzy.  You feel very thirsty.  You have urinated only a small amount of very dark urine over 6-8 hours. Get help right away if:  You have symptoms of severe dehydration.  You cannot drink fluids without vomiting.  Your symptoms get worse with treatment.  You have a fever.  You have a severe headache.  You have vomiting or diarrhea that:  Gets worse.  Does not go away.  You have blood or green matter  (bile) in your vomit.  You have blood in your stool. This may cause stool to look black and tarry.  You have not urinated in 6-8 hours.  You faint.  Your heart rate while sitting still is over 100 beats a minute.  You have trouble breathing. This information is not intended to replace advice given to you by your health care provider. Make sure you discuss any questions you have with your health care provider. Document Released: 04/28/2005 Document Revised: 11/23/2015 Document Reviewed: 06/22/2015 Elsevier Interactive Patient Education  2017 Elsevier Inc.  

## 2016-05-09 NOTE — Progress Notes (Signed)
Nutrition follow-up completed with patient and wife, during IV fluids for tonsil cancer. Weight improved documented as 164.3 pounds December 28. Patient reports he is tolerating 3 cans Osmolite 1.5 at 60 cc an hour continuous tube feeding overnight. Patient is tolerating Osmolite 1.5 2 cans bolus throughout the day and is working up to the third bolus feeding. Patient denies problems with tube feeding tolerance. He is sucking on ice chips and has plans to increase to soft solids when able.    Estimated nutrition needs: 2225-2425 calories, 95-115 grams protein, 2.3 L fluid.  Nutrition diagnosis: Inadequate oral intake continues. Severe malnutrition continues.  Intervention:  Educated patient to increase Osmolite 1.5-80 cc an hour for 12 hours continuous feeding overnight. In addition, patient will give one can Osmolite 1.5 twice a day with 120 cc free water before and after Questions were answered.  Teach back method used.  6 cans Osmolite 1.5 provides 2130 cal, 89 g protein, 2260 mL free water.  Monitoring, evaluation, goals:  Patient will tolerate a combination of continuous and bolus tube feeding to meet greater than 90% of estimated nutrition needs.  Next visit: Wednesday, January 3, during infusion.  **Disclaimer: This note was dictated with voice recognition software. Similar sounding words can inadvertently be transcribed and this note may contain transcription errors which may not have been corrected upon publication of note.**

## 2016-05-10 ENCOUNTER — Ambulatory Visit (HOSPITAL_BASED_OUTPATIENT_CLINIC_OR_DEPARTMENT_OTHER): Payer: BLUE CROSS/BLUE SHIELD

## 2016-05-10 VITALS — BP 131/81 | HR 55 | Temp 98.1°F | Resp 18

## 2016-05-10 DIAGNOSIS — C099 Malignant neoplasm of tonsil, unspecified: Secondary | ICD-10-CM

## 2016-05-10 MED ORDER — SODIUM CHLORIDE 0.9% FLUSH
10.0000 mL | Freq: Once | INTRAVENOUS | Status: DC
  Filled 2016-05-10: qty 10

## 2016-05-10 MED ORDER — HEPARIN SOD (PORK) LOCK FLUSH 100 UNIT/ML IV SOLN
500.0000 [IU] | Freq: Once | INTRAVENOUS | Status: AC
  Administered 2016-05-10: 500 [IU]
  Filled 2016-05-10: qty 5

## 2016-05-10 MED ORDER — SODIUM CHLORIDE 0.9 % IV SOLN
Freq: Once | INTRAVENOUS | Status: AC
  Administered 2016-05-10: 08:00:00 via INTRAVENOUS

## 2016-05-10 MED ORDER — ONDANSETRON HCL 4 MG/2ML IJ SOLN
INTRAMUSCULAR | Status: AC
  Filled 2016-05-10: qty 4

## 2016-05-10 NOTE — Patient Instructions (Signed)
Dehydration, Adult Dehydration is a condition in which there is not enough fluid or water in the body. This happens when you lose more fluids than you take in. Important organs, such as the kidneys, brain, and heart, cannot function without a proper amount of fluids. Any loss of fluids from the body can lead to dehydration. Dehydration can range from mild to severe. This condition should be treated right away to prevent it from becoming severe. What are the causes? This condition may be caused by:  Vomiting.  Diarrhea.  Excessive sweating, such as from heat exposure or exercise.  Not drinking enough fluid, especially:  When ill.  While doing activity that requires a lot of energy.  Excessive urination.  Fever.  Infection.  Certain medicines, such as medicines that cause the body to lose excess fluid (diuretics).  Inability to access safe drinking water.  Reduced physical ability to get adequate water and food. What increases the risk? This condition is more likely to develop in people:  Who have a poorly controlled long-term (chronic) illness, such as diabetes, heart disease, or kidney disease.  Who are age 65 or older.  Who are disabled.  Who live in a place with high altitude.  Who play endurance sports. What are the signs or symptoms? Symptoms of mild dehydration may include:   Thirst.  Dry lips.  Slightly dry mouth.  Dry, warm skin.  Dizziness. Symptoms of moderate dehydration may include:   Very dry mouth.  Muscle cramps.  Dark urine. Urine may be the color of tea.  Decreased urine production.  Decreased tear production.  Heartbeat that is irregular or faster than normal (palpitations).  Headache.  Light-headedness, especially when you stand up from a sitting position.  Fainting (syncope). Symptoms of severe dehydration may include:   Changes in skin, such as:  Cold and clammy skin.  Blotchy (mottled) or pale skin.  Skin that does  not quickly return to normal after being lightly pinched and released (poor skin turgor).  Changes in body fluids, such as:  Extreme thirst.  No tear production.  Inability to sweat when body temperature is high, such as in hot weather.  Very little urine production.  Changes in vital signs, such as:  Weak pulse.  Pulse that is more than 100 beats a minute when sitting still.  Rapid breathing.  Low blood pressure.  Other changes, such as:  Sunken eyes.  Cold hands and feet.  Confusion.  Lack of energy (lethargy).  Difficulty waking up from sleep.  Short-term weight loss.  Unconsciousness. How is this diagnosed? This condition is diagnosed based on your symptoms and a physical exam. Blood and urine tests may be done to help confirm the diagnosis. How is this treated? Treatment for this condition depends on the severity. Mild or moderate dehydration can often be treated at home. Treatment should be started right away. Do not wait until dehydration becomes severe. Severe dehydration is an emergency and it needs to be treated in a hospital. Treatment for mild dehydration may include:   Drinking more fluids.  Replacing salts and minerals in your blood (electrolytes) that you may have lost. Treatment for moderate dehydration may include:   Drinking an oral rehydration solution (ORS). This is a drink that helps you replace fluids and electrolytes (rehydrate). It can be found at pharmacies and retail stores. Treatment for severe dehydration may include:   Receiving fluids through an IV tube.  Receiving an electrolyte solution through a feeding tube that is   passed through your nose and into your stomach (nasogastric tube, or NG tube).  Correcting any abnormalities in electrolytes.  Treating the underlying cause of dehydration. Follow these instructions at home:  If directed by your health care provider, drink an ORS:  Make an ORS by following instructions on the  package.  Start by drinking small amounts, about  cup (120 mL) every 5-10 minutes.  Slowly increase how much you drink until you have taken the amount recommended by your health care provider.  Drink enough clear fluid to keep your urine clear or pale yellow. If you were told to drink an ORS, finish the ORS first, then start slowly drinking other clear fluids. Drink fluids such as:  Water. Do not drink only water. Doing that can lead to having too little salt (sodium) in the body (hyponatremia).  Ice chips.  Fruit juice that you have added water to (diluted fruit juice).  Low-calorie sports drinks.  Avoid:  Alcohol.  Drinks that contain a lot of sugar. These include high-calorie sports drinks, fruit juice that is not diluted, and soda.  Caffeine.  Foods that are greasy or contain a lot of fat or sugar.  Take over-the-counter and prescription medicines only as told by your health care provider.  Do not take sodium tablets. This can lead to having too much sodium in the body (hypernatremia).  Eat foods that contain a healthy balance of electrolytes, such as bananas, oranges, potatoes, tomatoes, and spinach.  Keep all follow-up visits as told by your health care provider. This is important. Contact a health care provider if:  You have abdominal pain that:  Gets worse.  Stays in one area (localizes).  You have a rash.  You have a stiff neck.  You are more irritable than usual.  You are sleepier or more difficult to wake up than usual.  You feel weak or dizzy.  You feel very thirsty.  You have urinated only a small amount of very dark urine over 6-8 hours. Get help right away if:  You have symptoms of severe dehydration.  You cannot drink fluids without vomiting.  Your symptoms get worse with treatment.  You have a fever.  You have a severe headache.  You have vomiting or diarrhea that:  Gets worse.  Does not go away.  You have blood or green matter  (bile) in your vomit.  You have blood in your stool. This may cause stool to look black and tarry.  You have not urinated in 6-8 hours.  You faint.  Your heart rate while sitting still is over 100 beats a minute.  You have trouble breathing. This information is not intended to replace advice given to you by your health care provider. Make sure you discuss any questions you have with your health care provider. Document Released: 04/28/2005 Document Revised: 11/23/2015 Document Reviewed: 06/22/2015 Elsevier Interactive Patient Education  2017 Elsevier Inc.  

## 2016-05-11 ENCOUNTER — Emergency Department (HOSPITAL_COMMUNITY): Payer: BLUE CROSS/BLUE SHIELD

## 2016-05-11 ENCOUNTER — Encounter (HOSPITAL_COMMUNITY): Payer: Self-pay | Admitting: Emergency Medicine

## 2016-05-11 ENCOUNTER — Emergency Department (HOSPITAL_COMMUNITY)
Admission: EM | Admit: 2016-05-11 | Discharge: 2016-05-11 | Disposition: A | Discharge status: Home / Self Care | Payer: BLUE CROSS/BLUE SHIELD | Attending: Emergency Medicine | Admitting: Emergency Medicine

## 2016-05-11 DIAGNOSIS — K5900 Constipation, unspecified: Secondary | ICD-10-CM | POA: Insufficient documentation

## 2016-05-11 DIAGNOSIS — E039 Hypothyroidism, unspecified: Secondary | ICD-10-CM | POA: Insufficient documentation

## 2016-05-11 DIAGNOSIS — R194 Change in bowel habit: Secondary | ICD-10-CM | POA: Diagnosis present

## 2016-05-11 DIAGNOSIS — I10 Essential (primary) hypertension: Secondary | ICD-10-CM | POA: Diagnosis not present

## 2016-05-11 DIAGNOSIS — Z87891 Personal history of nicotine dependence: Secondary | ICD-10-CM | POA: Diagnosis not present

## 2016-05-11 DIAGNOSIS — Z79899 Other long term (current) drug therapy: Secondary | ICD-10-CM | POA: Insufficient documentation

## 2016-05-11 DIAGNOSIS — D7389 Other diseases of spleen: Secondary | ICD-10-CM | POA: Insufficient documentation

## 2016-05-11 DIAGNOSIS — C099 Malignant neoplasm of tonsil, unspecified: Secondary | ICD-10-CM | POA: Insufficient documentation

## 2016-05-11 DIAGNOSIS — R1110 Vomiting, unspecified: Secondary | ICD-10-CM | POA: Diagnosis not present

## 2016-05-11 DIAGNOSIS — D739 Disease of spleen, unspecified: Secondary | ICD-10-CM

## 2016-05-11 LAB — CBC WITH DIFFERENTIAL/PLATELET
Basophils Absolute: 0 10*3/uL (ref 0.0–0.1)
Basophils Relative: 0 %
EOS ABS: 0.1 10*3/uL (ref 0.0–0.7)
EOS PCT: 1 %
HCT: 29.4 % — ABNORMAL LOW (ref 39.0–52.0)
HEMOGLOBIN: 10.3 g/dL — AB (ref 13.0–17.0)
LYMPHS ABS: 0.3 10*3/uL — AB (ref 0.7–4.0)
LYMPHS PCT: 8 %
MCH: 29.5 pg (ref 26.0–34.0)
MCHC: 35 g/dL (ref 30.0–36.0)
MCV: 84.2 fL (ref 78.0–100.0)
MONOS PCT: 15 %
Monocytes Absolute: 0.5 10*3/uL (ref 0.1–1.0)
NEUTROS PCT: 76 %
Neutro Abs: 2.6 10*3/uL (ref 1.7–7.7)
Platelets: 256 10*3/uL (ref 150–400)
RBC: 3.49 MIL/uL — ABNORMAL LOW (ref 4.22–5.81)
RDW: 13.3 % (ref 11.5–15.5)
WBC: 3.5 10*3/uL — ABNORMAL LOW (ref 4.0–10.5)

## 2016-05-11 LAB — URINALYSIS, ROUTINE W REFLEX MICROSCOPIC
Bilirubin Urine: NEGATIVE
Glucose, UA: NEGATIVE mg/dL
Hgb urine dipstick: NEGATIVE
Ketones, ur: NEGATIVE mg/dL
Leukocytes, UA: NEGATIVE
Nitrite: NEGATIVE
Protein, ur: NEGATIVE mg/dL
Specific Gravity, Urine: 1.016 (ref 1.005–1.030)
pH: 5 (ref 5.0–8.0)

## 2016-05-11 LAB — COMPREHENSIVE METABOLIC PANEL
ALK PHOS: 55 U/L (ref 38–126)
ALT: 67 U/L — ABNORMAL HIGH (ref 17–63)
ANION GAP: 8 (ref 5–15)
AST: 56 U/L — ABNORMAL HIGH (ref 15–41)
Albumin: 3.6 g/dL (ref 3.5–5.0)
BILIRUBIN TOTAL: 0.8 mg/dL (ref 0.3–1.2)
BUN: 30 mg/dL — ABNORMAL HIGH (ref 6–20)
CALCIUM: 8.9 mg/dL (ref 8.9–10.3)
CO2: 28 mmol/L (ref 22–32)
CREATININE: 1.13 mg/dL (ref 0.61–1.24)
Chloride: 96 mmol/L — ABNORMAL LOW (ref 101–111)
Glucose, Bld: 87 mg/dL (ref 65–99)
Potassium: 3.3 mmol/L — ABNORMAL LOW (ref 3.5–5.1)
SODIUM: 132 mmol/L — AB (ref 135–145)
TOTAL PROTEIN: 6 g/dL — AB (ref 6.5–8.1)

## 2016-05-11 LAB — CBC
HCT: 12.3 % — ABNORMAL LOW (ref 39.0–52.0)
Hemoglobin: 4.2 g/dL — CL (ref 13.0–17.0)
MCH: 29.6 pg (ref 26.0–34.0)
MCHC: 34.1 g/dL (ref 30.0–36.0)
MCV: 86.6 fL (ref 78.0–100.0)
Platelets: 105 10*3/uL — ABNORMAL LOW (ref 150–400)
RBC: 1.42 MIL/uL — ABNORMAL LOW (ref 4.22–5.81)
RDW: 13.1 % (ref 11.5–15.5)
WBC: 1.4 10*3/uL — CL (ref 4.0–10.5)

## 2016-05-11 LAB — LIPASE, BLOOD: LIPASE: 20 U/L (ref 11–51)

## 2016-05-11 LAB — MAGNESIUM: MAGNESIUM: 1.8 mg/dL (ref 1.7–2.4)

## 2016-05-11 LAB — I-STAT CG4 LACTIC ACID, ED: Lactic Acid, Venous: <0.3 mmol/L — ABNORMAL LOW (ref 0.5–1.9)

## 2016-05-11 MED ORDER — HEPARIN SOD (PORK) LOCK FLUSH 100 UNIT/ML IV SOLN
500.0000 [IU] | Freq: Once | INTRAVENOUS | Status: AC
  Administered 2016-05-11: 500 [IU]
  Filled 2016-05-11: qty 5

## 2016-05-11 MED ORDER — IOPAMIDOL (ISOVUE-300) INJECTION 61%
100.0000 mL | Freq: Once | INTRAVENOUS | Status: AC | PRN
  Administered 2016-05-11: 100 mL via INTRAVENOUS

## 2016-05-11 MED ORDER — MILK AND MOLASSES ENEMA
1.0000 | Freq: Once | RECTAL | Status: DC
  Filled 2016-05-11: qty 250

## 2016-05-11 MED ORDER — ONDANSETRON HCL 4 MG/2ML IJ SOLN: 4.0000 mg | Freq: Once | INTRAMUSCULAR | Status: DC | PRN

## 2016-05-11 MED ORDER — IOPAMIDOL (ISOVUE-300) INJECTION 61%
INTRAVENOUS | Status: AC
  Filled 2016-05-11: qty 100

## 2016-05-11 NOTE — ED Triage Notes (Signed)
Pt c/o obstipation, last BM Wednesday, indigestion.  Emesis onset yesterday, unable to tolerate PO fluids. Cancer center instructed pt to come here. Spitting up yellow/green mucus.   Hx Tonsil cancer. Radiation on Friday. Chemo on 05/06/16.

## 2016-05-11 NOTE — ED Provider Notes (Signed)
[facility] DEPT Provider Note   CSN: [id number] Arrival date & time: 05/11/16  1402     History   Chief Complaint Chief Complaint  Patient presents with  . Constipation  . Emesis    HPI Todd [name] is a 55 y.o. male.  The history is provided by the patient and medical records.     55 year old male with history of arthritis, hyperlipidemia, hypertension, hypothyroidism, tonsillar cancer diagnosed in September 2017 status post chemotherapy, presenting to the ED for change in bowel habits.  Patient reports he has not had a BM in 4 days which he states is highly unusual for him as he has daily BM's usually.  States he has a feeding tube and has been encouraged to try as much oral intake as he can tolerate, however has not been able to drink anything for the past several days.  States it feels like it gets stuck in his throat and he vomits it back up.  Has been tolerating his g-tube feeds well.  States he has been coughing as well which is somewhat chronic, however sputum is usually white and now it is green colored.  No chest pain or SOB.  States no true abdominal pain, some mild discomfort.  No fever, chills, sweats.  States he has been feeling generally weak for the past few days as well.  No blood in the stools, epistaxis, or other blood loss.  Past Medical History:  Diagnosis Date  . Arthritis   . Cancer ([place])    Tonsil Cancer  . Hyperlipidemia   . Hypertension   . Hypothyroidism     Patient Active Problem List   Diagnosis Date Noted  . Frequent epistaxis 04/28/2016  . Cough 04/21/2016  . Other constipation 04/21/2016  . Drug-induced neutropenia ([place]) 04/14/2016  . Anemia due to antineoplastic chemotherapy 04/14/2016  . Jaw pain 04/08/2016  . Chemotherapy-induced nausea 03/31/2016  . Bilateral tinnitus 03/31/2016  . Essential hypertension 03/18/2016  . Hemoptysis 02/26/2016  . Weight loss 02/26/2016  . Carcinoma of tonsillar fossa ([place]) 02/26/2016  . Tonsil  cancer ([place]) 02/21/2016    Past Surgical History:  Procedure Laterality Date  . IR GENERIC HISTORICAL  03/20/2016   IR US GUIDE VASC ACCESS RIGHT 03/20/2016 Sandi Mariscal, MD [facility]  . IR GENERIC HISTORICAL  03/20/2016   IR FLUORO GUIDE PORT INSERTION RIGHT 03/20/2016 Sandi Mariscal, MD [facility]  . IR GENERIC HISTORICAL  03/20/2016   IR GASTROSTOMY TUBE MOD SED 03/20/2016 Sandi Mariscal, MD [facility]       Home Medications    Prior to Admission medications   Medication Sig Start Date End Date Taking? Authorizing Provider  fenofibrate (TRICOR) 145 MG tablet Take 145 mg by mouth daily.  12/03/15  Yes Historical Provider, MD  lactulose (CHRONULAC) 10 GM/15ML solution Place 15 mLs (10 g total) into feeding tube 3 (three) times daily. 04/21/16  Yes Heath Lark, MD  levothyroxine (SYNTHROID, LEVOTHROID) 50 MCG tablet Take 50 mcg by mouth daily before breakfast.  11/21/15  Yes Historical Provider, MD  ondansetron (ZOFRAN) 8 MG tablet Take 1 tablet (8 mg total) by mouth 2 (two) times daily as needed. Start on the third day after chemotherapy. 03/17/16  Yes Heath Lark, MD  polyethylene glycol (MIRALAX) packet Take 17 g by mouth daily. 04/21/16  Yes Heath Lark, MD  prochlorperazine (COMPAZINE) 10 MG tablet Take 1 tablet (10 mg total) by mouth every 6 (six) hours as needed (Nausea or vomiting). 03/17/16  Yes Ni  Alvy Bimler, MD  scopolamine (TRANSDERM-SCOP) 1 MG/3DAYS Place 1 patch (1.5 mg total) onto the skin every 3 (three) days. 04/28/16  Yes Heath Lark, MD  fentaNYL (DURAGESIC - DOSED MCG/HR) 25 MCG/HR patch Place 1 patch (25 mcg total) onto the skin every 3 (three) days. Patient not taking: Reported on 05/11/2016 03/17/16   Heath Lark, MD  lidocaine (XYLOCAINE) 2 % solution Patient: Mix 1part 2% viscous lidocaine, 1part H20. Swish/swallow 54mL of this mixture, 78min before meals and at bedtime, up to QID Patient not taking: Reported on 05/11/2016 04/07/16   Eppie Gibson, MD  lidocaine-prilocaine (EMLA)  cream Apply to affected area once 03/17/16   Heath Lark, MD  morphine (ROXANOL) 20 MG/ML concentrated solution Take 0.5 mLs (10 mg total) by mouth every 2 (two) hours as needed for severe pain. Patient not taking: Reported on 05/11/2016 03/17/16   Heath Lark, MD  Nutritional Supplements (FEEDING SUPPLEMENT, OSMOLITE 1.5 CAL,) LIQD Give 1 can Osmolite 1.5 Via PEG TID with 120 cc free water bolus at 0900, 1200, 1500. Begin osmolite 1.5 at 6 pm at 60 cc/hr and run for 12 hours continuously. Flush with 120 cc free water before and after continuous feeding. Send pump and supplies for continuous feeding. 05/07/16   Heath Lark, MD  sodium fluoride (FLUORISHIELD) 1.1 % GEL dental gel Instill one drop of gel per tooth space of fluoride tray. Place over teeth for 5 minutes. Remove. Spit out excess. Repeat nightly. 02/28/16   Lenn Cal, DDS    Family History Family History  Problem Relation Age of Onset  . Cancer Maternal Grandmother     unknown ca  . Dementia Father   . Diabetes Father     Social History Social History  Substance Use Topics  . Smoking status: Former Research scientist (life sciences)  . Smokeless tobacco: Never Used  . Alcohol use No     Allergies   Phenergan [promethazine hcl]   Review of Systems Review of Systems  Respiratory: Positive for cough.   Gastrointestinal: Positive for constipation and vomiting.  Neurological: Positive for weakness (generalized).  All other systems reviewed and are negative.    Physical Exam Updated Vital Signs BP 127/87   Pulse 61   Temp 98 F (36.7 C) (Oral)   Resp 16   SpO2 100%   Physical Exam  Constitutional: He is oriented to person, place, and time. He appears well-developed and well-nourished.  HENT:  Head: Normocephalic and atraumatic.  Mouth/Throat: Oropharynx is clear and moist.  Necrotic appears areas of the tonsils, left > right; no new appearing swelling; intermittently spitting out mucous during exam, no stridor, raspy phonation which  is baseline per wife  Eyes: Conjunctivae and EOM are normal. Pupils are equal, round, and reactive to light.  Neck: Normal range of motion.  Cardiovascular: Normal rate, regular rhythm and normal heart sounds.   Pulmonary/Chest: Effort normal and breath sounds normal. No respiratory distress. He has no wheezes.  Port right chest wall, has been accessed, no signs of infection  Abdominal: Soft. Bowel sounds are normal.  g-tube in place LUQ, no signs of infection  Musculoskeletal: Normal range of motion.  Neurological: He is alert and oriented to person, place, and time.  Skin: Skin is warm and dry.  Psychiatric: He has a normal mood and affect.  Nursing note and vitals reviewed.    ED Treatments / Results  Labs (all labs ordered are listed, but only abnormal results are displayed) Labs Reviewed  CBC - Abnormal; Notable for  the following:       Result Value   WBC 1.4 (*)    RBC 1.42 (*)    Hemoglobin 4.2 (*)    HCT 12.3 (*)    Platelets 105 (*)    All other components within normal limits  I-STAT CG4 LACTIC ACID, ED - Abnormal; Notable for the following:    Lactic Acid, Venous <0.30 (*)    All other components within normal limits  LIPASE, BLOOD  COMPREHENSIVE METABOLIC PANEL  URINALYSIS, ROUTINE W REFLEX MICROSCOPIC    EKG  EKG Interpretation None       Radiology Dg Chest 2 View  Result Date: 05/11/2016 CLINICAL DATA:  No bowel movement in 4 days.  Vomiting. EXAM: CHEST  2 VIEW COMPARISON:  None. FINDINGS: Right Port-A-Cath is in place with tip in the SVC. Heart and mediastinal contours are within normal limits. No focal opacities or effusions. No acute bony abnormality. IMPRESSION: No active cardiopulmonary disease. Electronically Signed   By: Rolm Baptise M.D.   On: 05/11/2016 16:07   Ct Abdomen Pelvis W Contrast  Result Date: 05/11/2016 CLINICAL DATA:  Patient with constipation and indigestion. EXAM: CT ABDOMEN AND PELVIS WITH CONTRAST TECHNIQUE: Multidetector CT  imaging of the abdomen and pelvis was performed using the standard protocol following bolus administration of intravenous contrast. CONTRAST:  167mL ISOVUE-300 IOPAMIDOL (ISOVUE-300) INJECTION 61% COMPARISON:  PET-CT 02/26/2016. FINDINGS: Lower chest: Normal heart size. No consolidative pulmonary opacities. No pleural effusion. Minimal atelectasis within the lung bases. Hepatobiliary: Liver is normal in size and contour. 5 mm too small to characterize low-attenuation lesion right hepatic lobe (image 27; series 2). Gallbladder is unremarkable. No intrahepatic or extrahepatic biliary ductal dilatation. Pancreas: Unremarkable Spleen: Indeterminate 10 mm enhancing lesion within the spleen (image 17; series 2). Adrenals/Urinary Tract: The adrenal glands are normal. Kidneys enhance symmetrically with contrast. No hydronephrosis. Urinary bladder is decompressed. Stomach/Bowel: Sigmoid colonic diverticulosis. No CT evidence for acute diverticulitis. Percutaneous gastrostomy tube present and appears appropriately located. Normal morphology of the stomach. No evidence for bowel obstruction. No free fluid or free intraperitoneal air. Vascular/Lymphatic: Normal caliber abdominal aorta. No retroperitoneal lymphadenopathy. Reproductive: Prostate is enlarged. Central dystrophic calcifications. Other: No abdominal wall hernia or abnormality. No abdominopelvic ascites. Musculoskeletal: Lower thoracic and lumbar spine degenerative changes. No aggressive or acute appearing osseous lesions. IMPRESSION: No acute process within the abdomen or pelvis. Indeterminate 10 mm enhancing lesion within the spleen. Recommend attention on follow-up. Electronically Signed   By: Lovey Newcomer M.D.   On: 05/11/2016 17:49    Procedures Procedures (including critical care time)  Medications Ordered in ED Medications  ondansetron (ZOFRAN) injection 4 mg (not administered)  iopamidol (ISOVUE-300) 61 % injection (not administered)  milk and  molasses enema (not administered)  iopamidol (ISOVUE-300) 61 % injection 100 mL (100 mLs Intravenous Contrast Given 05/11/16 1726)     Initial Impression / Assessment and Plan / ED Course  I have reviewed the triage vital signs and the nursing notes.  Pertinent labs & imaging results that were available during my care of the patient were reviewed by me and considered in my medical decision making (see chart for details).  Clinical Course    55 y.o. M here with constipation.  He does have difficulty with PO oral intake which is chronic given location of his cancer (tonsillar).  He has had recent cough with green sputum.  He is afebrile, non-toxic in appearance.  He does have g-tube in place and port right chest  wall-- both without signs of infection.  Abdomen is soft and overall benign.  He does have necrotic appearing tissue of the tonsils, left worse than right. His voice is raspy which is baseline per wife. He is not having any stridor. He recently completed chemotherapy.  Will plan for labs and CXR as well as CT abdomen and pelvis given his hx.    3:46 PM Patient initial CBC with new pancytopenia.  Notified by lab that all results on CMP are grossly abnormal.  They request recollection of all labs including CBC.  New orders placed for lab work as well as imaging studies.  Repeat lab work largely unchanged from prior.  CXR clear. CT scan with new enhancing lesion of the spleen, will need follow-up for this, however no acute obstructive process.  Patient was given enema here and able to have a few small bowel movements.  He is feeling better and would like to go home.  Feel this is appropriate. He has previously scheduled follow-up with his oncologist in 3 days.  Have strongly recommended to discuss this ED visit with Dr. Alvy Bimler who has been handing his care as he will need close monitoring of lesion found on spleen today.  Discussed plan with patient and wife, they both acknowledged understanding  and agreed with plan of care.  Return precautions given for new or worsening symptoms.  Final Clinical Impressions(s) / ED Diagnoses   Final diagnoses:  Constipation, unspecified constipation type  Tonsillar cancer [facility])  Splenic lesion    New Prescriptions New Prescriptions   No medications on file     Kathryne Hitch 05/11/16 2037    Drenda Freeze, MD 05/12/16 [phone]

## 2016-05-11 NOTE — ED Notes (Signed)
Pt made aware of need for urine sample. Urinal and call light at bedside.

## 2016-05-11 NOTE — ED Notes (Signed)
Pt on bedside commode.

## 2016-05-11 NOTE — Discharge Instructions (Signed)
As we discussed today there is a lesion seen on your spleen which was not present on prior scans.  We are not sure of the significance of this today. Follow-up with Dr. Alvy Bimler on Thursday as scheduled.  Return to the ED for new or worsening symptoms.

## 2016-05-11 NOTE — ED Notes (Signed)
Pt given urinal and made aware of need for urine specimen 

## 2016-05-12 DIAGNOSIS — C029 Malignant neoplasm of tongue, unspecified: Secondary | ICD-10-CM | POA: Diagnosis not present

## 2016-05-12 DIAGNOSIS — C099 Malignant neoplasm of tonsil, unspecified: Secondary | ICD-10-CM | POA: Diagnosis not present

## 2016-05-12 DIAGNOSIS — C801 Malignant (primary) neoplasm, unspecified: Secondary | ICD-10-CM | POA: Diagnosis not present

## 2016-05-13 ENCOUNTER — Encounter: Payer: Self-pay | Admitting: Radiation Oncology

## 2016-05-13 ENCOUNTER — Ambulatory Visit
Admission: RE | Admit: 2016-05-13 | Discharge: 2016-05-13 | Disposition: A | Discharge status: Home / Self Care | Payer: BLUE CROSS/BLUE SHIELD | Source: Ambulatory Visit | Attending: Radiation Oncology | Admitting: Radiation Oncology

## 2016-05-13 ENCOUNTER — Ambulatory Visit (HOSPITAL_BASED_OUTPATIENT_CLINIC_OR_DEPARTMENT_OTHER): Payer: BLUE CROSS/BLUE SHIELD

## 2016-05-13 VITALS — Temp 98.2°F | Ht 70.0 in | Wt 158.6 lb

## 2016-05-13 VITALS — BP 124/72 | HR 64 | Resp 18

## 2016-05-13 DIAGNOSIS — R1310 Dysphagia, unspecified: Secondary | ICD-10-CM | POA: Diagnosis not present

## 2016-05-13 DIAGNOSIS — E785 Hyperlipidemia, unspecified: Secondary | ICD-10-CM | POA: Diagnosis not present

## 2016-05-13 DIAGNOSIS — Z79899 Other long term (current) drug therapy: Secondary | ICD-10-CM | POA: Diagnosis not present

## 2016-05-13 DIAGNOSIS — Z87891 Personal history of nicotine dependence: Secondary | ICD-10-CM | POA: Diagnosis not present

## 2016-05-13 DIAGNOSIS — M1990 Unspecified osteoarthritis, unspecified site: Secondary | ICD-10-CM | POA: Diagnosis not present

## 2016-05-13 DIAGNOSIS — C090 Malignant neoplasm of tonsillar fossa: Secondary | ICD-10-CM

## 2016-05-13 DIAGNOSIS — Z7982 Long term (current) use of aspirin: Secondary | ICD-10-CM | POA: Diagnosis not present

## 2016-05-13 DIAGNOSIS — C099 Malignant neoplasm of tonsil, unspecified: Secondary | ICD-10-CM

## 2016-05-13 DIAGNOSIS — E860 Dehydration: Secondary | ICD-10-CM

## 2016-05-13 DIAGNOSIS — Z510 Encounter for antineoplastic radiation therapy: Secondary | ICD-10-CM | POA: Diagnosis not present

## 2016-05-13 DIAGNOSIS — E039 Hypothyroidism, unspecified: Secondary | ICD-10-CM | POA: Diagnosis not present

## 2016-05-13 DIAGNOSIS — Z809 Family history of malignant neoplasm, unspecified: Secondary | ICD-10-CM | POA: Diagnosis not present

## 2016-05-13 DIAGNOSIS — I10 Essential (primary) hypertension: Secondary | ICD-10-CM | POA: Diagnosis not present

## 2016-05-13 MED ORDER — ONDANSETRON HCL 4 MG/2ML IJ SOLN
8.0000 mg | Freq: Once | INTRAMUSCULAR | Status: AC
  Administered 2016-05-13: 8 mg via INTRAVENOUS

## 2016-05-13 MED ORDER — SODIUM CHLORIDE 0.9 % IV SOLN
Freq: Once | INTRAVENOUS | Status: AC
  Administered 2016-05-13: 10:00:00 via INTRAVENOUS

## 2016-05-13 MED ORDER — SODIUM CHLORIDE 0.9 % IV SOLN: Freq: Once | INTRAVENOUS | Status: DC

## 2016-05-13 MED ORDER — ONDANSETRON HCL 4 MG/2ML IJ SOLN
INTRAMUSCULAR | Status: AC
  Filled 2016-05-13: qty 4

## 2016-05-13 MED ORDER — SODIUM CHLORIDE 0.9% FLUSH
10.0000 mL | INTRAVENOUS | Status: DC | PRN
  Administered 2016-05-13: 10 mL
  Filled 2016-05-13: qty 10

## 2016-05-13 MED ORDER — HEPARIN SOD (PORK) LOCK FLUSH 100 UNIT/ML IV SOLN
500.0000 [IU] | Freq: Once | INTRAVENOUS | Status: AC | PRN
  Administered 2016-05-13: 500 [IU]
  Filled 2016-05-13: qty 5

## 2016-05-13 NOTE — Patient Instructions (Signed)
Dehydration, Adult Dehydration is a condition in which there is not enough fluid or water in the body. This happens when you lose more fluids than you take in. Important organs, such as the kidneys, brain, and heart, cannot function without a proper amount of fluids. Any loss of fluids from the body can lead to dehydration. Dehydration can range from mild to severe. This condition should be treated right away to prevent it from becoming severe. What are the causes? This condition may be caused by:  Vomiting.  Diarrhea.  Excessive sweating, such as from heat exposure or exercise.  Not drinking enough fluid, especially:  When ill.  While doing activity that requires a lot of energy.  Excessive urination.  Fever.  Infection.  Certain medicines, such as medicines that cause the body to lose excess fluid (diuretics).  Inability to access safe drinking water.  Reduced physical ability to get adequate water and food. What increases the risk? This condition is more likely to develop in people:  Who have a poorly controlled long-term (chronic) illness, such as diabetes, heart disease, or kidney disease.  Who are age 65 or older.  Who are disabled.  Who live in a place with high altitude.  Who play endurance sports. What are the signs or symptoms? Symptoms of mild dehydration may include:   Thirst.  Dry lips.  Slightly dry mouth.  Dry, warm skin.  Dizziness. Symptoms of moderate dehydration may include:   Very dry mouth.  Muscle cramps.  Dark urine. Urine may be the color of tea.  Decreased urine production.  Decreased tear production.  Heartbeat that is irregular or faster than normal (palpitations).  Headache.  Light-headedness, especially when you stand up from a sitting position.  Fainting (syncope). Symptoms of severe dehydration may include:   Changes in skin, such as:  Cold and clammy skin.  Blotchy (mottled) or pale skin.  Skin that does  not quickly return to normal after being lightly pinched and released (poor skin turgor).  Changes in body fluids, such as:  Extreme thirst.  No tear production.  Inability to sweat when body temperature is high, such as in hot weather.  Very little urine production.  Changes in vital signs, such as:  Weak pulse.  Pulse that is more than 100 beats a minute when sitting still.  Rapid breathing.  Low blood pressure.  Other changes, such as:  Sunken eyes.  Cold hands and feet.  Confusion.  Lack of energy (lethargy).  Difficulty waking up from sleep.  Short-term weight loss.  Unconsciousness. How is this diagnosed? This condition is diagnosed based on your symptoms and a physical exam. Blood and urine tests may be done to help confirm the diagnosis. How is this treated? Treatment for this condition depends on the severity. Mild or moderate dehydration can often be treated at home. Treatment should be started right away. Do not wait until dehydration becomes severe. Severe dehydration is an emergency and it needs to be treated in a hospital. Treatment for mild dehydration may include:   Drinking more fluids.  Replacing salts and minerals in your blood (electrolytes) that you may have lost. Treatment for moderate dehydration may include:   Drinking an oral rehydration solution (ORS). This is a drink that helps you replace fluids and electrolytes (rehydrate). It can be found at pharmacies and retail stores. Treatment for severe dehydration may include:   Receiving fluids through an IV tube.  Receiving an electrolyte solution through a feeding tube that is   passed through your nose and into your stomach (nasogastric tube, or NG tube).  Correcting any abnormalities in electrolytes.  Treating the underlying cause of dehydration. Follow these instructions at home:  If directed by your health care provider, drink an ORS:  Make an ORS by following instructions on the  package.  Start by drinking small amounts, about  cup (120 mL) every 5-10 minutes.  Slowly increase how much you drink until you have taken the amount recommended by your health care provider.  Drink enough clear fluid to keep your urine clear or pale yellow. If you were told to drink an ORS, finish the ORS first, then start slowly drinking other clear fluids. Drink fluids such as:  Water. Do not drink only water. Doing that can lead to having too little salt (sodium) in the body (hyponatremia).  Ice chips.  Fruit juice that you have added water to (diluted fruit juice).  Low-calorie sports drinks.  Avoid:  Alcohol.  Drinks that contain a lot of sugar. These include high-calorie sports drinks, fruit juice that is not diluted, and soda.  Caffeine.  Foods that are greasy or contain a lot of fat or sugar.  Take over-the-counter and prescription medicines only as told by your health care provider.  Do not take sodium tablets. This can lead to having too much sodium in the body (hypernatremia).  Eat foods that contain a healthy balance of electrolytes, such as bananas, oranges, potatoes, tomatoes, and spinach.  Keep all follow-up visits as told by your health care provider. This is important. Contact a health care provider if:  You have abdominal pain that:  Gets worse.  Stays in one area (localizes).  You have a rash.  You have a stiff neck.  You are more irritable than usual.  You are sleepier or more difficult to wake up than usual.  You feel weak or dizzy.  You feel very thirsty.  You have urinated only a small amount of very dark urine over 6-8 hours. Get help right away if:  You have symptoms of severe dehydration.  You cannot drink fluids without vomiting.  Your symptoms get worse with treatment.  You have a fever.  You have a severe headache.  You have vomiting or diarrhea that:  Gets worse.  Does not go away.  You have blood or green matter  (bile) in your vomit.  You have blood in your stool. This may cause stool to look black and tarry.  You have not urinated in 6-8 hours.  You faint.  Your heart rate while sitting still is over 100 beats a minute.  You have trouble breathing. This information is not intended to replace advice given to you by your health care provider. Make sure you discuss any questions you have with your health care provider. Document Released: 04/28/2005 Document Revised: 11/23/2015 Document Reviewed: 06/22/2015 Elsevier Interactive Patient Education  2017 Elsevier Inc.  

## 2016-05-13 NOTE — Progress Notes (Signed)
  Radiation Oncology         [phone] ________________________________  Name: Todd [name] MRN: [id number]  Date: 05/13/2016  DOB: Oct 31, 1960  Weekly Radiation Therapy Management    ICD-9-CM ICD-10-CM   1. 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 . . . . . . . The patient presents for routine under treatment assessment.                                   Todd [name] presents for his 33rd fraction of radiation to his Left Tonsil and bilateral neck. He denies pain. He does have thick saliva present. He reports going to the ED on 05/11/16 for constipation and received an enema with relief. He believes the constipation may be due to his chemotherapy. He is currently taking lactulose and miralax to help with constipation. He is receiving IV fluids daily in Medical Oncology. He completed his chemotherapy last week. He is not taking anything orally except ice chips. He is receiving continuous feedings at night and two cans during the day through his feeding tube averaging out to about 6 cans total. His neck has dry peeling noted to the anterior and posterior areas. He is using sonafine twice daily and will switch to a vitamin E cream when his tube is completed. He does report yellowish/greenish eye drainage since removing his scopalamine patch due to his severe constipation. Denies being around anyone with conjunctivitis. CT of the abd/pelvis on 05/11/16 noted a 1.0 cm enhancing lesion in the spleen.                                  Set-up films were reviewed.                                 The chart was checked. Physical Findings. . .  height is 5\' 10"  (1.778 m) and weight is 158 lb 9.6 oz (71.9 kg). His temperature is 98.2 F (36.8 C).   Weight loss noted. Lungs are clear to auscultation bilaterally. Heart has regular rate and rhythm. Oropharynx shows mucositis, but no hemorraghic mucositis. Thickened saliva. PEG tube looks  healthy with no sign of infection. Eyes mild erythema, no obvious signs of conjunctivitis. Hyperpigmentation changes of the neck, dry desquamation along the left side of the neck. No palpable cervical or supraclavicular adenopathy. Impression . . . . . . . The patient is tolerating radiation. Plan . . . . . . . . . . . . Continue treatment as planned. The patient is scheduled to follow up with rad onc on 05/30/16. ________________________________   Blair Promise, PhD, MD  This document serves as a record of services personally performed by Gery Pray, MD. It was created on his behalf by Darcus Austin, a trained medical scribe. The creation of this record is based on the scribe's personal observations and the provider's statements to them. This document has been checked and approved by the attending provider.

## 2016-05-13 NOTE — Progress Notes (Signed)
Todd [name] presents for his 33rd fraction of radiation to his Left Tonsil and bilateral neck. He denies pain. He does have thick saliva present. He reports going to the ED on Sunday for constipation and received an enema with relief. He believes the constipation may be due to his chemotherapy. He is currently taking lactulose and miralax to help with constipation. He is receiving IV fluids daily in Medical Oncology. He completed his chemotherapy last week. He is not taking anything orally except ice chips. He is receiving continuous feedings at night and two cans during the day through his feeding tube to average out to about 6 cans total. His neck has dry peeling noted to the anterior and posterior areas. He is using sonafine twice daily, and will switch to a vitamin E cream when his tube is completed. He does report yellowish/greenish eye drainage since removing his scopalamine patch due to his severe constipation.   Temp 98.2 F (36.8 C)   Ht 5\' 10"  (1.778 m)   Wt 158 lb 9.6 oz (71.9 kg)   BMI 22.76 kg/m    Ortostatics: BP sitting 111/69 pulse 71, BP standing 111/76, pulse 78  Wt Readings from Last 3 Encounters:  05/13/16 158 lb 9.6 oz (71.9 kg)  05/08/16 164 lb 4.8 oz (74.5 kg)  05/07/16 162 lb 12.8 oz (73.8 kg)

## 2016-05-14 ENCOUNTER — Ambulatory Visit
Admission: RE | Admit: 2016-05-14 | Discharge: 2016-05-14 | Disposition: A | Discharge status: Home / Self Care | Payer: BLUE CROSS/BLUE SHIELD | Source: Ambulatory Visit | Attending: Radiation Oncology | Admitting: Radiation Oncology

## 2016-05-14 ENCOUNTER — Encounter: Payer: Self-pay | Admitting: Nurse Practitioner

## 2016-05-14 ENCOUNTER — Ambulatory Visit: Payer: BLUE CROSS/BLUE SHIELD | Admitting: Nutrition

## 2016-05-14 ENCOUNTER — Ambulatory Visit (HOSPITAL_BASED_OUTPATIENT_CLINIC_OR_DEPARTMENT_OTHER): Payer: BLUE CROSS/BLUE SHIELD | Admitting: Nurse Practitioner

## 2016-05-14 ENCOUNTER — Ambulatory Visit: Payer: BLUE CROSS/BLUE SHIELD

## 2016-05-14 VITALS — BP 132/70 | HR 63 | Temp 98.5°F | Resp 18

## 2016-05-14 DIAGNOSIS — Z510 Encounter for antineoplastic radiation therapy: Secondary | ICD-10-CM | POA: Diagnosis not present

## 2016-05-14 DIAGNOSIS — Z87891 Personal history of nicotine dependence: Secondary | ICD-10-CM | POA: Diagnosis not present

## 2016-05-14 DIAGNOSIS — E039 Hypothyroidism, unspecified: Secondary | ICD-10-CM | POA: Diagnosis not present

## 2016-05-14 DIAGNOSIS — I10 Essential (primary) hypertension: Secondary | ICD-10-CM | POA: Diagnosis not present

## 2016-05-14 DIAGNOSIS — C099 Malignant neoplasm of tonsil, unspecified: Secondary | ICD-10-CM | POA: Diagnosis not present

## 2016-05-14 DIAGNOSIS — M1990 Unspecified osteoarthritis, unspecified site: Secondary | ICD-10-CM | POA: Diagnosis not present

## 2016-05-14 DIAGNOSIS — M1991 Primary osteoarthritis, unspecified site: Secondary | ICD-10-CM | POA: Diagnosis not present

## 2016-05-14 DIAGNOSIS — R1310 Dysphagia, unspecified: Secondary | ICD-10-CM | POA: Diagnosis not present

## 2016-05-14 DIAGNOSIS — Z79899 Other long term (current) drug therapy: Secondary | ICD-10-CM | POA: Diagnosis not present

## 2016-05-14 DIAGNOSIS — E785 Hyperlipidemia, unspecified: Secondary | ICD-10-CM | POA: Diagnosis not present

## 2016-05-14 DIAGNOSIS — C090 Malignant neoplasm of tonsillar fossa: Secondary | ICD-10-CM | POA: Diagnosis not present

## 2016-05-14 DIAGNOSIS — E860 Dehydration: Secondary | ICD-10-CM | POA: Diagnosis not present

## 2016-05-14 DIAGNOSIS — Z431 Encounter for attention to gastrostomy: Secondary | ICD-10-CM | POA: Diagnosis not present

## 2016-05-14 DIAGNOSIS — Z809 Family history of malignant neoplasm, unspecified: Secondary | ICD-10-CM | POA: Diagnosis not present

## 2016-05-14 DIAGNOSIS — Z7982 Long term (current) use of aspirin: Secondary | ICD-10-CM | POA: Diagnosis not present

## 2016-05-14 MED ORDER — SODIUM CHLORIDE 0.9 % IV SOLN
Freq: Once | INTRAVENOUS | Status: AC
  Administered 2016-05-14: 10:00:00 via INTRAVENOUS

## 2016-05-14 MED ORDER — ONDANSETRON HCL 4 MG/2ML IJ SOLN
INTRAMUSCULAR | Status: AC
  Filled 2016-05-14: qty 4

## 2016-05-14 MED ORDER — ONDANSETRON HCL 4 MG/2ML IJ SOLN
8.0000 mg | Freq: Once | INTRAMUSCULAR | Status: AC
  Administered 2016-05-14: 8 mg via INTRAVENOUS

## 2016-05-14 MED ORDER — SODIUM CHLORIDE 0.9 % IV SOLN: Freq: Once | INTRAVENOUS | Status: DC

## 2016-05-14 MED ORDER — SODIUM CHLORIDE 0.9% FLUSH
10.0000 mL | INTRAVENOUS | Status: DC | PRN
  Administered 2016-05-14: 10 mL
  Filled 2016-05-14: qty 10

## 2016-05-14 MED ORDER — HEPARIN SOD (PORK) LOCK FLUSH 100 UNIT/ML IV SOLN
500.0000 [IU] | Freq: Once | INTRAVENOUS | Status: AC | PRN
  Administered 2016-05-14: 500 [IU]
  Filled 2016-05-14: qty 5

## 2016-05-14 NOTE — Progress Notes (Signed)
Nutrition follow-up completed with patient receiving IV fluids for tonsil cancer. Weight decreased and documented as 158 pounds down from 164.3 pounds December 28. Patient reports he is only utilizing 5 cans of Osmolite 1.5 daily. (goal is 6 cans) He reports he has nausea daily and is taking Compazine, and Zofran. Last bowel movement was on Sunday. RN providing additional instructions on medications for bowel regimen.  Estimated nutrition needs: 2225-2425 calories, 95-115 grams protein, 2.3 L fluid.  Nutrition diagnosis:  Inadequate oral intake and severe malnutrition.  Continue.  Intervention: Educated patient to increase Osmolite 1.5 to goal rate of 6 cans a day. Patient encouraged to run continuous tube feedings at 80 cc an hour for 12 hours and give one can bolus Osmolite 1.5 twice a day with 120 cc free water before and after Teach back method was used. Patient is agreeable to increasing tube feeding.  Monitoring, evaluation, goals:  Patient will tolerate continuous and bolus feedings to minimize further weight loss.  Next visit: To be scheduled as needed.  **Disclaimer: This note was dictated with voice recognition software. Similar sounding words can inadvertently be transcribed and this note may contain transcription errors which may not have been corrected upon publication of note.**

## 2016-05-14 NOTE — Progress Notes (Signed)
Ernestene Kiel, Dietician in to see pt.  Reviewed meds related to constipation and pt given written instructions. Pt voiced understanding  RN visit only.

## 2016-05-14 NOTE — Patient Instructions (Signed)
Dehydration, Adult Dehydration is a condition in which there is not enough fluid or water in the body. This happens when you lose more fluids than you take in. Important organs, such as the kidneys, brain, and heart, cannot function without a proper amount of fluids. Any loss of fluids from the body can lead to dehydration. Dehydration can range from mild to severe. This condition should be treated right away to prevent it from becoming severe. What are the causes? This condition may be caused by:  Vomiting.  Diarrhea.  Excessive sweating, such as from heat exposure or exercise.  Not drinking enough fluid, especially:  When ill.  While doing activity that requires a lot of energy.  Excessive urination.  Fever.  Infection.  Certain medicines, such as medicines that cause the body to lose excess fluid (diuretics).  Inability to access safe drinking water.  Reduced physical ability to get adequate water and food. What increases the risk? This condition is more likely to develop in people:  Who have a poorly controlled long-term (chronic) illness, such as diabetes, heart disease, or kidney disease.  Who are age 65 or older.  Who are disabled.  Who live in a place with high altitude.  Who play endurance sports. What are the signs or symptoms? Symptoms of mild dehydration may include:   Thirst.  Dry lips.  Slightly dry mouth.  Dry, warm skin.  Dizziness. Symptoms of moderate dehydration may include:   Very dry mouth.  Muscle cramps.  Dark urine. Urine may be the color of tea.  Decreased urine production.  Decreased tear production.  Heartbeat that is irregular or faster than normal (palpitations).  Headache.  Light-headedness, especially when you stand up from a sitting position.  Fainting (syncope). Symptoms of severe dehydration may include:   Changes in skin, such as:  Cold and clammy skin.  Blotchy (mottled) or pale skin.  Skin that does  not quickly return to normal after being lightly pinched and released (poor skin turgor).  Changes in body fluids, such as:  Extreme thirst.  No tear production.  Inability to sweat when body temperature is high, such as in hot weather.  Very little urine production.  Changes in vital signs, such as:  Weak pulse.  Pulse that is more than 100 beats a minute when sitting still.  Rapid breathing.  Low blood pressure.  Other changes, such as:  Sunken eyes.  Cold hands and feet.  Confusion.  Lack of energy (lethargy).  Difficulty waking up from sleep.  Short-term weight loss.  Unconsciousness. How is this diagnosed? This condition is diagnosed based on your symptoms and a physical exam. Blood and urine tests may be done to help confirm the diagnosis. How is this treated? Treatment for this condition depends on the severity. Mild or moderate dehydration can often be treated at home. Treatment should be started right away. Do not wait until dehydration becomes severe. Severe dehydration is an emergency and it needs to be treated in a hospital. Treatment for mild dehydration may include:   Drinking more fluids.  Replacing salts and minerals in your blood (electrolytes) that you may have lost. Treatment for moderate dehydration may include:   Drinking an oral rehydration solution (ORS). This is a drink that helps you replace fluids and electrolytes (rehydrate). It can be found at pharmacies and retail stores. Treatment for severe dehydration may include:   Receiving fluids through an IV tube.  Receiving an electrolyte solution through a feeding tube that is   passed through your nose and into your stomach (nasogastric tube, or NG tube).  Correcting any abnormalities in electrolytes.  Treating the underlying cause of dehydration. Follow these instructions at home:  If directed by your health care provider, drink an ORS:  Make an ORS by following instructions on the  package.  Start by drinking small amounts, about  cup (120 mL) every 5-10 minutes.  Slowly increase how much you drink until you have taken the amount recommended by your health care provider.  Drink enough clear fluid to keep your urine clear or pale yellow. If you were told to drink an ORS, finish the ORS first, then start slowly drinking other clear fluids. Drink fluids such as:  Water. Do not drink only water. Doing that can lead to having too little salt (sodium) in the body (hyponatremia).  Ice chips.  Fruit juice that you have added water to (diluted fruit juice).  Low-calorie sports drinks.  Avoid:  Alcohol.  Drinks that contain a lot of sugar. These include high-calorie sports drinks, fruit juice that is not diluted, and soda.  Caffeine.  Foods that are greasy or contain a lot of fat or sugar.  Take over-the-counter and prescription medicines only as told by your health care provider.  Do not take sodium tablets. This can lead to having too much sodium in the body (hypernatremia).  Eat foods that contain a healthy balance of electrolytes, such as bananas, oranges, potatoes, tomatoes, and spinach.  Keep all follow-up visits as told by your health care provider. This is important. Contact a health care provider if:  You have abdominal pain that:  Gets worse.  Stays in one area (localizes).  You have a rash.  You have a stiff neck.  You are more irritable than usual.  You are sleepier or more difficult to wake up than usual.  You feel weak or dizzy.  You feel very thirsty.  You have urinated only a small amount of very dark urine over 6-8 hours. Get help right away if:  You have symptoms of severe dehydration.  You cannot drink fluids without vomiting.  Your symptoms get worse with treatment.  You have a fever.  You have a severe headache.  You have vomiting or diarrhea that:  Gets worse.  Does not go away.  You have blood or green matter  (bile) in your vomit.  You have blood in your stool. This may cause stool to look black and tarry.  You have not urinated in 6-8 hours.  You faint.  Your heart rate while sitting still is over 100 beats a minute.  You have trouble breathing. This information is not intended to replace advice given to you by your health care provider. Make sure you discuss any questions you have with your health care provider. Document Released: 04/28/2005 Document Revised: 11/23/2015 Document Reviewed: 06/22/2015 Elsevier Interactive Patient Education  2017 Elsevier Inc.  

## 2016-05-15 ENCOUNTER — Ambulatory Visit
Admission: RE | Admit: 2016-05-15 | Discharge: 2016-05-15 | Disposition: A | Discharge status: Home / Self Care | Payer: BLUE CROSS/BLUE SHIELD | Source: Ambulatory Visit | Attending: Radiation Oncology | Admitting: Radiation Oncology

## 2016-05-15 ENCOUNTER — Ambulatory Visit: Payer: BLUE CROSS/BLUE SHIELD

## 2016-05-15 ENCOUNTER — Telehealth: Payer: Self-pay | Admitting: *Deleted

## 2016-05-15 ENCOUNTER — Encounter: Payer: Self-pay | Admitting: *Deleted

## 2016-05-15 ENCOUNTER — Telehealth: Payer: Self-pay | Admitting: Hematology and Oncology

## 2016-05-15 ENCOUNTER — Ambulatory Visit (HOSPITAL_BASED_OUTPATIENT_CLINIC_OR_DEPARTMENT_OTHER): Payer: BLUE CROSS/BLUE SHIELD | Admitting: Hematology and Oncology

## 2016-05-15 ENCOUNTER — Ambulatory Visit (HOSPITAL_BASED_OUTPATIENT_CLINIC_OR_DEPARTMENT_OTHER): Payer: BLUE CROSS/BLUE SHIELD

## 2016-05-15 DIAGNOSIS — C099 Malignant neoplasm of tonsil, unspecified: Secondary | ICD-10-CM

## 2016-05-15 DIAGNOSIS — D702 Other drug-induced agranulocytosis: Secondary | ICD-10-CM | POA: Diagnosis not present

## 2016-05-15 DIAGNOSIS — D739 Disease of spleen, unspecified: Secondary | ICD-10-CM

## 2016-05-15 DIAGNOSIS — H5789 Other specified disorders of eye and adnexa: Secondary | ICD-10-CM

## 2016-05-15 DIAGNOSIS — R634 Abnormal weight loss: Secondary | ICD-10-CM

## 2016-05-15 DIAGNOSIS — M1990 Unspecified osteoarthritis, unspecified site: Secondary | ICD-10-CM | POA: Diagnosis not present

## 2016-05-15 DIAGNOSIS — D7389 Other diseases of spleen: Secondary | ICD-10-CM

## 2016-05-15 DIAGNOSIS — R140 Abdominal distension (gaseous): Secondary | ICD-10-CM

## 2016-05-15 DIAGNOSIS — E785 Hyperlipidemia, unspecified: Secondary | ICD-10-CM | POA: Diagnosis not present

## 2016-05-15 DIAGNOSIS — E039 Hypothyroidism, unspecified: Secondary | ICD-10-CM | POA: Diagnosis not present

## 2016-05-15 DIAGNOSIS — Z7982 Long term (current) use of aspirin: Secondary | ICD-10-CM | POA: Diagnosis not present

## 2016-05-15 DIAGNOSIS — H578 Other specified disorders of eye and adnexa: Secondary | ICD-10-CM

## 2016-05-15 DIAGNOSIS — Z79899 Other long term (current) drug therapy: Secondary | ICD-10-CM | POA: Diagnosis not present

## 2016-05-15 DIAGNOSIS — C090 Malignant neoplasm of tonsillar fossa: Secondary | ICD-10-CM | POA: Diagnosis not present

## 2016-05-15 DIAGNOSIS — Z510 Encounter for antineoplastic radiation therapy: Secondary | ICD-10-CM | POA: Diagnosis not present

## 2016-05-15 DIAGNOSIS — Z87891 Personal history of nicotine dependence: Secondary | ICD-10-CM | POA: Diagnosis not present

## 2016-05-15 DIAGNOSIS — I10 Essential (primary) hypertension: Secondary | ICD-10-CM | POA: Diagnosis not present

## 2016-05-15 DIAGNOSIS — R1310 Dysphagia, unspecified: Secondary | ICD-10-CM | POA: Diagnosis not present

## 2016-05-15 DIAGNOSIS — Z809 Family history of malignant neoplasm, unspecified: Secondary | ICD-10-CM | POA: Diagnosis not present

## 2016-05-15 MED ORDER — ONDANSETRON HCL 4 MG/2ML IJ SOLN
8.0000 mg | Freq: Once | INTRAMUSCULAR | Status: AC
  Administered 2016-05-15: 8 mg via INTRAVENOUS

## 2016-05-15 MED ORDER — SODIUM CHLORIDE 0.9 % IV SOLN
Freq: Once | INTRAVENOUS | Status: AC
  Administered 2016-05-15: 10:00:00 via INTRAVENOUS

## 2016-05-15 MED ORDER — ONDANSETRON HCL 4 MG/2ML IJ SOLN
INTRAMUSCULAR | Status: AC
  Filled 2016-05-15: qty 4

## 2016-05-15 MED ORDER — HEPARIN SOD (PORK) LOCK FLUSH 100 UNIT/ML IV SOLN
500.0000 [IU] | Freq: Once | INTRAVENOUS | Status: AC | PRN
  Administered 2016-05-15: 500 [IU]
  Filled 2016-05-15: qty 5

## 2016-05-15 MED ORDER — SODIUM CHLORIDE 0.9 % IV SOLN: Freq: Once | INTRAVENOUS | Status: DC

## 2016-05-15 MED ORDER — METOCLOPRAMIDE HCL 5 MG/5ML PO SOLN: 10.0000 mg | Freq: Three times a day (TID) | ORAL | 11 refills | Status: DC

## 2016-05-15 MED ORDER — SODIUM CHLORIDE 0.9% FLUSH
10.0000 mL | INTRAVENOUS | Status: DC | PRN
  Administered 2016-05-15: 10 mL
  Filled 2016-05-15: qty 10

## 2016-05-15 MED FILL — METOCLOPRAMIDE 5 MG/5 ML SY: 5 | 6 days supply | Qty: 240 | Fill #0

## 2016-05-15 NOTE — Patient Instructions (Signed)
Dehydration, Adult Dehydration is a condition in which there is not enough fluid or water in the body. This happens when you lose more fluids than you take in. Important organs, such as the kidneys, brain, and heart, cannot function without a proper amount of fluids. Any loss of fluids from the body can lead to dehydration. Dehydration can range from mild to severe. This condition should be treated right away to prevent it from becoming severe. What are the causes? This condition may be caused by:  Vomiting.  Diarrhea.  Excessive sweating, such as from heat exposure or exercise.  Not drinking enough fluid, especially:  When ill.  While doing activity that requires a lot of energy.  Excessive urination.  Fever.  Infection.  Certain medicines, such as medicines that cause the body to lose excess fluid (diuretics).  Inability to access safe drinking water.  Reduced physical ability to get adequate water and food. What increases the risk? This condition is more likely to develop in people:  Who have a poorly controlled long-term (chronic) illness, such as diabetes, heart disease, or kidney disease.  Who are age 65 or older.  Who are disabled.  Who live in a place with high altitude.  Who play endurance sports. What are the signs or symptoms? Symptoms of mild dehydration may include:   Thirst.  Dry lips.  Slightly dry mouth.  Dry, warm skin.  Dizziness. Symptoms of moderate dehydration may include:   Very dry mouth.  Muscle cramps.  Dark urine. Urine may be the color of tea.  Decreased urine production.  Decreased tear production.  Heartbeat that is irregular or faster than normal (palpitations).  Headache.  Light-headedness, especially when you stand up from a sitting position.  Fainting (syncope). Symptoms of severe dehydration may include:   Changes in skin, such as:  Cold and clammy skin.  Blotchy (mottled) or pale skin.  Skin that does  not quickly return to normal after being lightly pinched and released (poor skin turgor).  Changes in body fluids, such as:  Extreme thirst.  No tear production.  Inability to sweat when body temperature is high, such as in hot weather.  Very little urine production.  Changes in vital signs, such as:  Weak pulse.  Pulse that is more than 100 beats a minute when sitting still.  Rapid breathing.  Low blood pressure.  Other changes, such as:  Sunken eyes.  Cold hands and feet.  Confusion.  Lack of energy (lethargy).  Difficulty waking up from sleep.  Short-term weight loss.  Unconsciousness. How is this diagnosed? This condition is diagnosed based on your symptoms and a physical exam. Blood and urine tests may be done to help confirm the diagnosis. How is this treated? Treatment for this condition depends on the severity. Mild or moderate dehydration can often be treated at home. Treatment should be started right away. Do not wait until dehydration becomes severe. Severe dehydration is an emergency and it needs to be treated in a hospital. Treatment for mild dehydration may include:   Drinking more fluids.  Replacing salts and minerals in your blood (electrolytes) that you may have lost. Treatment for moderate dehydration may include:   Drinking an oral rehydration solution (ORS). This is a drink that helps you replace fluids and electrolytes (rehydrate). It can be found at pharmacies and retail stores. Treatment for severe dehydration may include:   Receiving fluids through an IV tube.  Receiving an electrolyte solution through a feeding tube that is   passed through your nose and into your stomach (nasogastric tube, or NG tube).  Correcting any abnormalities in electrolytes.  Treating the underlying cause of dehydration. Follow these instructions at home:  If directed by your health care provider, drink an ORS:  Make an ORS by following instructions on the  package.  Start by drinking small amounts, about  cup (120 mL) every 5-10 minutes.  Slowly increase how much you drink until you have taken the amount recommended by your health care provider.  Drink enough clear fluid to keep your urine clear or pale yellow. If you were told to drink an ORS, finish the ORS first, then start slowly drinking other clear fluids. Drink fluids such as:  Water. Do not drink only water. Doing that can lead to having too little salt (sodium) in the body (hyponatremia).  Ice chips.  Fruit juice that you have added water to (diluted fruit juice).  Low-calorie sports drinks.  Avoid:  Alcohol.  Drinks that contain a lot of sugar. These include high-calorie sports drinks, fruit juice that is not diluted, and soda.  Caffeine.  Foods that are greasy or contain a lot of fat or sugar.  Take over-the-counter and prescription medicines only as told by your health care provider.  Do not take sodium tablets. This can lead to having too much sodium in the body (hypernatremia).  Eat foods that contain a healthy balance of electrolytes, such as bananas, oranges, potatoes, tomatoes, and spinach.  Keep all follow-up visits as told by your health care provider. This is important. Contact a health care provider if:  You have abdominal pain that:  Gets worse.  Stays in one area (localizes).  You have a rash.  You have a stiff neck.  You are more irritable than usual.  You are sleepier or more difficult to wake up than usual.  You feel weak or dizzy.  You feel very thirsty.  You have urinated only a small amount of very dark urine over 6-8 hours. Get help right away if:  You have symptoms of severe dehydration.  You cannot drink fluids without vomiting.  Your symptoms get worse with treatment.  You have a fever.  You have a severe headache.  You have vomiting or diarrhea that:  Gets worse.  Does not go away.  You have blood or green matter  (bile) in your vomit.  You have blood in your stool. This may cause stool to look black and tarry.  You have not urinated in 6-8 hours.  You faint.  Your heart rate while sitting still is over 100 beats a minute.  You have trouble breathing. This information is not intended to replace advice given to you by your health care provider. Make sure you discuss any questions you have with your health care provider. Document Released: 04/28/2005 Document Revised: 11/23/2015 Document Reviewed: 06/22/2015 Elsevier Interactive Patient Education  2017 Elsevier Inc.  

## 2016-05-15 NOTE — Progress Notes (Signed)
Oncology Nurse Navigator Documentation  Met with Todd [name] during final RT to offer support and to celebrate end of radiation treatment.  He was accompanied by his wife. I provided wife with a Certificate of Recognition for her supportive care. I provided post-RT guidance:  Importance of keeping follow-up appts with Nutrition and SLP.  Importance of protecting treatment area from sun.  Continuation of Sonafine application 2-3 times daily. I explained that my role as navigator will continue for several more months and that I will be calling and/or joining him during follow-up visits.   I encouraged them to call me with needs/concerns.   Patient and wife verbalized understanding of information provided.  Gayleen Orem, RN, BSN, [place] at [place] [phone]

## 2016-05-15 NOTE — Telephone Encounter (Signed)
Appointments scheduled per 1/4 LOS. Patient given AVS report and calendars with future scheduled appointments. °

## 2016-05-15 NOTE — Telephone Encounter (Signed)
Oncology Nurse Navigator Documentation  Received VM from patient wife requesting refill for lactulose.  Per Dr. Alvy Bimler, called order to [place], spoke with [facility], provided verbal order for lactulose ([place]) 10 gm/56ml solution as previously issued but with 6 refills: dispense 240 mL, instructions 'Place 15 mLs (10 g total) into feeding tube 3 times daily.  Later called wife to inform Rx had been called in.  Gayleen Orem, RN, BSN, [place] at [place] [phone]

## 2016-05-16 ENCOUNTER — Encounter: Payer: Self-pay | Admitting: Hematology and Oncology

## 2016-05-16 ENCOUNTER — Ambulatory Visit: Payer: BLUE CROSS/BLUE SHIELD

## 2016-05-16 ENCOUNTER — Other Ambulatory Visit: Payer: Self-pay | Admitting: Medical Oncology

## 2016-05-16 ENCOUNTER — Ambulatory Visit (HOSPITAL_BASED_OUTPATIENT_CLINIC_OR_DEPARTMENT_OTHER): Payer: BLUE CROSS/BLUE SHIELD

## 2016-05-16 VITALS — BP 146/86 | HR 62 | Temp 98.7°F | Resp 18

## 2016-05-16 DIAGNOSIS — E860 Dehydration: Secondary | ICD-10-CM

## 2016-05-16 DIAGNOSIS — C099 Malignant neoplasm of tonsil, unspecified: Secondary | ICD-10-CM

## 2016-05-16 DIAGNOSIS — D7389 Other diseases of spleen: Secondary | ICD-10-CM | POA: Insufficient documentation

## 2016-05-16 DIAGNOSIS — R140 Abdominal distension (gaseous): Secondary | ICD-10-CM | POA: Insufficient documentation

## 2016-05-16 DIAGNOSIS — D739 Disease of spleen, unspecified: Secondary | ICD-10-CM

## 2016-05-16 DIAGNOSIS — H5789 Other specified disorders of eye and adnexa: Secondary | ICD-10-CM | POA: Insufficient documentation

## 2016-05-16 MED ORDER — SODIUM CHLORIDE 0.9% FLUSH
10.0000 mL | INTRAVENOUS | Status: DC | PRN
  Administered 2016-05-16: 10 mL
  Filled 2016-05-16: qty 10

## 2016-05-16 MED ORDER — ONDANSETRON HCL 4 MG/2ML IJ SOLN
INTRAMUSCULAR | Status: AC
  Filled 2016-05-16: qty 4

## 2016-05-16 MED ORDER — HEPARIN SOD (PORK) LOCK FLUSH 100 UNIT/ML IV SOLN
250.0000 [IU] | Freq: Once | INTRAVENOUS | Status: AC | PRN
  Administered 2016-05-16: 500 [IU]
  Filled 2016-05-16: qty 5

## 2016-05-16 MED ORDER — SODIUM CHLORIDE 0.9 % IV SOLN
Freq: Once | INTRAVENOUS | Status: AC
  Administered 2016-05-16: 08:00:00 via INTRAVENOUS

## 2016-05-16 MED ORDER — ONDANSETRON HCL 4 MG/2ML IJ SOLN
8.0000 mg | Freq: Once | INTRAMUSCULAR | Status: AC
  Administered 2016-05-16: 8 mg via INTRAVENOUS

## 2016-05-16 MED ORDER — ONDANSETRON HCL 40 MG/20ML IJ SOLN: Freq: Once | INTRAMUSCULAR | Status: DC

## 2016-05-16 MED ORDER — SODIUM CHLORIDE 0.9 % IV SOLN: Freq: Once | INTRAVENOUS | Status: DC

## 2016-05-16 NOTE — Assessment & Plan Note (Signed)
He has completed all chemotherapy and radiation therapy He is experiencing worsening side effects from treatment. I will continue to see him on a regular basis for supportive care

## 2016-05-16 NOTE — Patient Instructions (Signed)
Dehydration, Adult Dehydration is a condition in which there is not enough fluid or water in the body. This happens when you lose more fluids than you take in. Important organs, such as the kidneys, brain, and heart, cannot function without a proper amount of fluids. Any loss of fluids from the body can lead to dehydration. Dehydration can range from mild to severe. This condition should be treated right away to prevent it from becoming severe. What are the causes? This condition may be caused by:  Vomiting.  Diarrhea.  Excessive sweating, such as from heat exposure or exercise.  Not drinking enough fluid, especially:  When ill.  While doing activity that requires a lot of energy.  Excessive urination.  Fever.  Infection.  Certain medicines, such as medicines that cause the body to lose excess fluid (diuretics).  Inability to access safe drinking water.  Reduced physical ability to get adequate water and food. What increases the risk? This condition is more likely to develop in people:  Who have a poorly controlled long-term (chronic) illness, such as diabetes, heart disease, or kidney disease.  Who are age 65 or older.  Who are disabled.  Who live in a place with high altitude.  Who play endurance sports. What are the signs or symptoms? Symptoms of mild dehydration may include:   Thirst.  Dry lips.  Slightly dry mouth.  Dry, warm skin.  Dizziness. Symptoms of moderate dehydration may include:   Very dry mouth.  Muscle cramps.  Dark urine. Urine may be the color of tea.  Decreased urine production.  Decreased tear production.  Heartbeat that is irregular or faster than normal (palpitations).  Headache.  Light-headedness, especially when you stand up from a sitting position.  Fainting (syncope). Symptoms of severe dehydration may include:   Changes in skin, such as:  Cold and clammy skin.  Blotchy (mottled) or pale skin.  Skin that does  not quickly return to normal after being lightly pinched and released (poor skin turgor).  Changes in body fluids, such as:  Extreme thirst.  No tear production.  Inability to sweat when body temperature is high, such as in hot weather.  Very little urine production.  Changes in vital signs, such as:  Weak pulse.  Pulse that is more than 100 beats a minute when sitting still.  Rapid breathing.  Low blood pressure.  Other changes, such as:  Sunken eyes.  Cold hands and feet.  Confusion.  Lack of energy (lethargy).  Difficulty waking up from sleep.  Short-term weight loss.  Unconsciousness. How is this diagnosed? This condition is diagnosed based on your symptoms and a physical exam. Blood and urine tests may be done to help confirm the diagnosis. How is this treated? Treatment for this condition depends on the severity. Mild or moderate dehydration can often be treated at home. Treatment should be started right away. Do not wait until dehydration becomes severe. Severe dehydration is an emergency and it needs to be treated in a hospital. Treatment for mild dehydration may include:   Drinking more fluids.  Replacing salts and minerals in your blood (electrolytes) that you may have lost. Treatment for moderate dehydration may include:   Drinking an oral rehydration solution (ORS). This is a drink that helps you replace fluids and electrolytes (rehydrate). It can be found at pharmacies and retail stores. Treatment for severe dehydration may include:   Receiving fluids through an IV tube.  Receiving an electrolyte solution through a feeding tube that is   passed through your nose and into your stomach (nasogastric tube, or NG tube).  Correcting any abnormalities in electrolytes.  Treating the underlying cause of dehydration. Follow these instructions at home:  If directed by your health care provider, drink an ORS:  Make an ORS by following instructions on the  package.  Start by drinking small amounts, about  cup (120 mL) every 5-10 minutes.  Slowly increase how much you drink until you have taken the amount recommended by your health care provider.  Drink enough clear fluid to keep your urine clear or pale yellow. If you were told to drink an ORS, finish the ORS first, then start slowly drinking other clear fluids. Drink fluids such as:  Water. Do not drink only water. Doing that can lead to having too little salt (sodium) in the body (hyponatremia).  Ice chips.  Fruit juice that you have added water to (diluted fruit juice).  Low-calorie sports drinks.  Avoid:  Alcohol.  Drinks that contain a lot of sugar. These include high-calorie sports drinks, fruit juice that is not diluted, and soda.  Caffeine.  Foods that are greasy or contain a lot of fat or sugar.  Take over-the-counter and prescription medicines only as told by your health care provider.  Do not take sodium tablets. This can lead to having too much sodium in the body (hypernatremia).  Eat foods that contain a healthy balance of electrolytes, such as bananas, oranges, potatoes, tomatoes, and spinach.  Keep all follow-up visits as told by your health care provider. This is important. Contact a health care provider if:  You have abdominal pain that:  Gets worse.  Stays in one area (localizes).  You have a rash.  You have a stiff neck.  You are more irritable than usual.  You are sleepier or more difficult to wake up than usual.  You feel weak or dizzy.  You feel very thirsty.  You have urinated only a small amount of very dark urine over 6-8 hours. Get help right away if:  You have symptoms of severe dehydration.  You cannot drink fluids without vomiting.  Your symptoms get worse with treatment.  You have a fever.  You have a severe headache.  You have vomiting or diarrhea that:  Gets worse.  Does not go away.  You have blood or green matter  (bile) in your vomit.  You have blood in your stool. This may cause stool to look black and tarry.  You have not urinated in 6-8 hours.  You faint.  Your heart rate while sitting still is over 100 beats a minute.  You have trouble breathing. This information is not intended to replace advice given to you by your health care provider. Make sure you discuss any questions you have with your health care provider. Document Released: 04/28/2005 Document Revised: 11/23/2015 Document Reviewed: 06/22/2015 Elsevier Interactive Patient Education  2017 Elsevier Inc.  

## 2016-05-16 NOTE — Assessment & Plan Note (Signed)
Likely viral in nature His vision is preserved I do not recommend topical antibiotic ointment

## 2016-05-16 NOTE — Progress Notes (Signed)
[place] OFFICE PROGRESS NOTE  Patient Care Team: Haywood Pao, MD as PCP - General (Internal Medicine) Leota Sauers, RN as Oncology Nurse Navigator Eppie Gibson, MD as Attending Physician (Radiation Oncology) Heath Lark, MD as Consulting Physician (Hematology and Oncology) Karie Mainland, RD as Dietitian (Nutrition)  SUMMARY OF ONCOLOGIC HISTORY:   Tonsil cancer ([place])   02/07/2016 Imaging    Ct neck showed abnormal soft tissue within the left pharynx, extending from the left tonsillar pillar inferiorly to the level of the hyoid and filling the left vallecula. The appearance is most consistent with a neoplastic process. 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 Histologic sampling and direct visualization should be considered. Confluent adenopathy within the left cervical chain with areas of central hypoattenuation suggesting degrees of necrosis. This is most suggestive of metastatic spread. The areas of necrosis within the adenopathy combined with the above-described pharyngeal soft tissue mass are together suggestive of squamous cell carcinoma.      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 Borderline hypermetabolic small right station IIb lymph node. Several tiny lung nodules are observed, these are not hypermetabolic but are also below the threshold level for accurate characterization. Dedicated CT chest may be warranted to act as a baseline.      03/20/2016 Procedure    Placement of a right internal jugular approach power injectable Port-A-Cath. Fluoroscopic insertion of a 20-French pull-through gastrostomy tube.       03/24/2016 - 05/06/2016 Chemotherapy    He received high dose cisplatin; dose 2 and 3 with dose reduction due to side-effects      03/24/2016 - 05/15/2016 Radiation Therapy    He received concurrent radiation       INTERVAL HISTORY: Please see below for problem oriented charting. He was recently seen in the ER for severe constipation It has since resolved He denies significant throat pain His skin is sensitive to touch He his able to advance tube feed to 6 cans per day; he has lost weight He has not tried oral intake He is disturbed by mucous secretions He has post-prandial fullness He is concerned about recent CT abnormalities He has bilateral redness in eyes with crusting; no changes in vision  REVIEW OF SYSTEMS:   Constitutional: Denies fevers, chills  Eyes: Denies blurriness of vision Ears, nose, mouth, throat, and face: Denies mucositis or sore throat Respiratory: Denies cough, dyspnea or wheezes Cardiovascular: Denies palpitation, chest discomfort or lower extremity swelling Skin: Denies abnormal skin rashes Lymphatics: Denies new lymphadenopathy or easy bruising Neurological:Denies numbness, tingling or new weaknesses Behavioral/Psych: Mood is stable, no new changes  All other systems were reviewed with the patient and are negative.  I have reviewed the past medical history, past surgical history, social history and family history with the patient and they are unchanged from previous note.  ALLERGIES:  is allergic to phenergan [promethazine hcl].  MEDICATIONS:  Current Outpatient Prescriptions  Medication Sig Dispense Refill  . fenofibrate (TRICOR) 145 MG tablet Take 145 mg by mouth daily.     [name] lactulose (CHRONULAC) 10 GM/15ML solution Place 15 mLs (10 g total) into feeding tube  3 (three) times daily. 240 mL 0  . levothyroxine (SYNTHROID, LEVOTHROID) 50 MCG tablet Take 50 mcg by mouth daily before breakfast.     . lidocaine (XYLOCAINE) 2 % solution Patient: Mix  1part 2% viscous lidocaine, 1part H20. Swish/swallow 27mL of this mixture, 9min before meals and at bedtime, up to QID (Patient not taking: Reported on 05/13/2016) 200 mL 3  . lidocaine-prilocaine (EMLA) cream Apply to affected area once 30 g 3  . metoCLOPramide (REGLAN) 5 MG/5ML solution Take 10 mLs (10 mg total) by mouth 4 (four) times daily -  before meals and at bedtime. 240 mL 11  . Nutritional Supplements (FEEDING SUPPLEMENT, OSMOLITE 1.5 CAL,) LIQD Give 1 can Osmolite 1.5 Via PEG TID with 120 cc free water bolus at 0900, 1200, 1500. Begin osmolite 1.5 at 6 pm at 60 cc/hr and run for 12 hours continuously. Flush with 120 cc free water before and after continuous feeding. Send pump and supplies for continuous feeding. 1422 mL 0  . ondansetron (ZOFRAN) 8 MG tablet Take 1 tablet (8 mg total) by mouth 2 (two) times daily as needed. Start on the third day after chemotherapy. 30 tablet 1  . polyethylene glycol (MIRALAX) packet Take 17 g by mouth daily. 14 each 0  . prochlorperazine (COMPAZINE) 10 MG tablet Take 1 tablet (10 mg total) by mouth every 6 (six) hours as needed (Nausea or vomiting). 30 tablet 1  . sodium fluoride (FLUORISHIELD) 1.1 % GEL dental gel Instill one drop of gel per tooth space of fluoride tray. Place over teeth for 5 minutes. Remove. Spit out excess. Repeat nightly. 120 mL prn   No current facility-administered medications for this visit.     PHYSICAL EXAMINATION: ECOG PERFORMANCE STATUS: 1 - Symptomatic but completely ambulatory  Vitals:   05/15/16 0916  BP: 130/75  Pulse: 69  Resp: 18  Temp: 98.6 F (37 C)   Filed Weights   05/15/16 0916  Weight: 161 lb 9.6 oz (73.3 kg)    GENERAL:alert, no distress and comfortable. He looks thinner SKIN: skin color is abnormal with radiation dermatitis, texture, turgor are normal, no rashes or significant lesions EYES: normal, Conjunctiva are pink and non-injected, sclera clear OROPHARYNX:no exudate, no erythema and lips, buccal  mucosa, and tongue normal  NECK: supple, thyroid normal size, non-tender, without nodularity LYMPH:  no palpable lymphadenopathy in the cervical, axillary or inguinal LUNGS: clear to auscultation and percussion with normal breathing effort HEART: regular rate & rhythm and no murmurs and no lower extremity edema ABDOMEN:abdomen soft, non-tender and normal bowel sounds. Feeding tube looks ok Musculoskeletal:no cyanosis of digits and no clubbing  NEURO: alert & oriented x 3 with fluent speech, no focal motor/sensory deficits  LABORATORY DATA:  I have reviewed the data as listed    Component Value Date/Time   NA 132 (L) 05/11/2016 1608   NA 140 05/02/2016 0836   K 3.3 (L) 05/11/2016 1608   K 4.2 05/02/2016 0836   CL 96 (L) 05/11/2016 1608   CO2 28 05/11/2016 1608   CO2 29 05/02/2016 0836   GLUCOSE 87 05/11/2016 1608   GLUCOSE 131 05/02/2016 0836   BUN 30 (H) 05/11/2016 1608   BUN 22.2 05/02/2016 0836   CREATININE 1.13 05/11/2016 1608   CREATININE 1.1 05/02/2016 0836   CALCIUM 8.9 05/11/2016 1608   CALCIUM 10.4 05/02/2016 0836   PROT 6.0 (L) 05/11/2016 1608   PROT 7.1 05/02/2016 0836   ALBUMIN 3.6 05/11/2016 1608   ALBUMIN 3.5 05/02/2016  0836   AST 56 (H) 05/11/2016 1608   AST 13 05/02/2016 0836   ALT 67 (H) 05/11/2016 1608   ALT 13 05/02/2016 0836   ALKPHOS 55 05/11/2016 1608   ALKPHOS 52 05/02/2016 0836   BILITOT 0.8 05/11/2016 1608   BILITOT 0.38 05/02/2016 0836   GFRNONAA >60 05/11/2016 1608   GFRAA >60 05/11/2016 1608    No results found for: SPEP, UPEP  Lab Results  Component Value Date   WBC 3.5 (L) 05/11/2016   NEUTROABS 2.6 05/11/2016   HGB 10.3 (L) 05/11/2016   HCT 29.4 (L) 05/11/2016   MCV 84.2 05/11/2016   PLT 256 05/11/2016      Chemistry      Component Value Date/Time   NA 132 (L) 05/11/2016 1608   NA 140 05/02/2016 0836   K 3.3 (L) 05/11/2016 1608   K 4.2 05/02/2016 0836   CL 96 (L) 05/11/2016 1608   CO2 28 05/11/2016 1608   CO2 29 05/02/2016  0836   BUN 30 (H) 05/11/2016 1608   BUN 22.2 05/02/2016 0836   CREATININE 1.13 05/11/2016 1608   CREATININE 1.1 05/02/2016 0836      Component Value Date/Time   CALCIUM 8.9 05/11/2016 1608   CALCIUM 10.4 05/02/2016 0836   ALKPHOS 55 05/11/2016 1608   ALKPHOS 52 05/02/2016 0836   AST 56 (H) 05/11/2016 1608   AST 13 05/02/2016 0836   ALT 67 (H) 05/11/2016 1608   ALT 13 05/02/2016 0836   BILITOT 0.8 05/11/2016 1608   BILITOT 0.38 05/02/2016 0836       RADIOGRAPHIC STUDIES:I reviewed recent CT and PET imaging with him and his wife I have personally reviewed the radiological images as listed and agreed with the findings in the report. Dg Chest 2 View  Result Date: 05/11/2016 CLINICAL DATA:  No bowel movement in 4 days.  Vomiting. EXAM: CHEST  2 VIEW COMPARISON:  None. FINDINGS: Right Port-A-Cath is in place with tip in the SVC. Heart and mediastinal contours are within normal limits. No focal opacities or effusions. No acute bony abnormality. IMPRESSION: No active cardiopulmonary disease. Electronically Signed   By: Rolm Baptise M.D.   On: 05/11/2016 16:07   Ct Abdomen Pelvis W Contrast  Result Date: 05/11/2016 CLINICAL DATA:  Patient with constipation and indigestion. EXAM: CT ABDOMEN AND PELVIS WITH CONTRAST TECHNIQUE: Multidetector CT imaging of the abdomen and pelvis was performed using the standard protocol following bolus administration of intravenous contrast. CONTRAST:  170mL ISOVUE-300 IOPAMIDOL (ISOVUE-300) INJECTION 61% COMPARISON:  PET-CT 02/26/2016. FINDINGS: Lower chest: Normal heart size. No consolidative pulmonary opacities. No pleural effusion. Minimal atelectasis within the lung bases. Hepatobiliary: Liver is normal in size and contour. 5 mm too small to characterize low-attenuation lesion right hepatic lobe (image 27; series 2). Gallbladder is unremarkable. No intrahepatic or extrahepatic biliary ductal dilatation. Pancreas: Unremarkable Spleen: Indeterminate 10 mm  enhancing lesion within the spleen (image 17; series 2). Adrenals/Urinary Tract: The adrenal glands are normal. Kidneys enhance symmetrically with contrast. No hydronephrosis. Urinary bladder is decompressed. Stomach/Bowel: Sigmoid colonic diverticulosis. No CT evidence for acute diverticulitis. Percutaneous gastrostomy tube present and appears appropriately located. Normal morphology of the stomach. No evidence for bowel obstruction. No free fluid or free intraperitoneal air. Vascular/Lymphatic: Normal caliber abdominal aorta. No retroperitoneal lymphadenopathy. Reproductive: Prostate is enlarged. Central dystrophic calcifications. Other: No abdominal wall hernia or abnormality. No abdominopelvic ascites. Musculoskeletal: Lower thoracic and lumbar spine degenerative changes. No aggressive or acute appearing osseous lesions. IMPRESSION: No acute  process within the abdomen or pelvis. Indeterminate 10 mm enhancing lesion within the spleen. Recommend attention on follow-up. Electronically Signed   By: Lovey Newcomer M.D.   On: 05/11/2016 17:49    ASSESSMENT & PLAN:  Tonsil cancer [facility]) He has completed all chemotherapy and radiation therapy He is experiencing worsening side effects from treatment. I will continue to see him on a regular basis for supportive care  Weight loss He continues to have progressive weight loss. He is not able to tolerate more than 6 cans of nutritional supplement per day due to early satiety and significant mucus production I recommend reduce water flushes through the feeding tube and come in daily for IV fluid support along with scopolamine patch to dry mucus production and he agrees I want him potential implication of delayed healing and possibility of not able to receive further chemotherapy due to significant pancytopenia with progressive weight loss He will try to increase feeding/ intake as tolerated He is started on continuous feeding which I think would help him with weight  gain I recommend the patient to try to increase oral intake especially liquid by mouth if possible  Drug-induced neutropenia ([place]) This is likely due to recent treatment. The patient denies recent history of fevers, cough, chills, diarrhea or dysuria. He is asymptomatic from the leukopenia. I will observe for now.   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  All questions were answered. The patient knows to call the clinic with any problems, questions or concerns. No barriers to learning was detected. I spent 25 minutes counseling the patient face to face. The total time spent in the appointment was 40 minutes and more than 50% was on counseling and review of test results     Heath Lark, MD 05/16/2016 9:09 PM

## 2016-05-16 NOTE — Assessment & Plan Note (Signed)
He continues to have progressive weight loss. He is not able to tolerate more than 6 cans of nutritional supplement per day due to early satiety and significant mucus production I recommend reduce water flushes through the feeding tube and come in daily for IV fluid support along with scopolamine patch to dry mucus production and he agrees I want him potential implication of delayed healing and possibility of not able to receive further chemotherapy due to significant pancytopenia with progressive weight loss He will try to increase feeding/ intake as tolerated He is started on continuous feeding which I think would help him with weight gain I recommend the patient to try to increase oral intake especially liquid by mouth if possible

## 2016-05-16 NOTE — Assessment & Plan Note (Signed)
This is likely due to recent treatment. The patient denies recent history of fevers, cough, chills, diarrhea or dysuria. He is asymptomatic from the leukopenia. I will observe for now.  

## 2016-05-16 NOTE — Assessment & Plan Note (Signed)
He has early satiety and difficulties tolerating nutritional feeding I recommend a trial of reglan 4 x per day

## 2016-05-16 NOTE — Assessment & Plan Note (Signed)
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

## 2016-05-17 ENCOUNTER — Ambulatory Visit (HOSPITAL_BASED_OUTPATIENT_CLINIC_OR_DEPARTMENT_OTHER): Payer: BLUE CROSS/BLUE SHIELD

## 2016-05-17 VITALS — BP 140/82 | HR 65 | Temp 98.4°F | Resp 18

## 2016-05-17 DIAGNOSIS — C099 Malignant neoplasm of tonsil, unspecified: Secondary | ICD-10-CM

## 2016-05-17 MED ORDER — SODIUM CHLORIDE 0.9% FLUSH
10.0000 mL | INTRAVENOUS | Status: DC | PRN
  Administered 2016-05-17: 10 mL
  Filled 2016-05-17: qty 10

## 2016-05-17 MED ORDER — HEPARIN SOD (PORK) LOCK FLUSH 100 UNIT/ML IV SOLN
500.0000 [IU] | Freq: Once | INTRAVENOUS | Status: AC | PRN
  Administered 2016-05-17: 500 [IU]
  Filled 2016-05-17: qty 5

## 2016-05-17 MED ORDER — SODIUM CHLORIDE 0.9 % IV SOLN
Freq: Once | INTRAVENOUS | Status: AC
  Administered 2016-05-17: 08:00:00 via INTRAVENOUS

## 2016-05-17 MED ORDER — ONDANSETRON HCL 4 MG/2ML IJ SOLN
INTRAMUSCULAR | Status: AC
  Filled 2016-05-17: qty 4

## 2016-05-17 NOTE — Patient Instructions (Signed)
Dehydration, Adult Dehydration is a condition in which there is not enough fluid or water in the body. This happens when you lose more fluids than you take in. Important organs, such as the kidneys, brain, and heart, cannot function without a proper amount of fluids. Any loss of fluids from the body can lead to dehydration. Dehydration can range from mild to severe. This condition should be treated right away to prevent it from becoming severe. What are the causes? This condition may be caused by:  Vomiting.  Diarrhea.  Excessive sweating, such as from heat exposure or exercise.  Not drinking enough fluid, especially:  When ill.  While doing activity that requires a lot of energy.  Excessive urination.  Fever.  Infection.  Certain medicines, such as medicines that cause the body to lose excess fluid (diuretics).  Inability to access safe drinking water.  Reduced physical ability to get adequate water and food. What increases the risk? This condition is more likely to develop in people:  Who have a poorly controlled long-term (chronic) illness, such as diabetes, heart disease, or kidney disease.  Who are age 65 or older.  Who are disabled.  Who live in a place with high altitude.  Who play endurance sports. What are the signs or symptoms? Symptoms of mild dehydration may include:   Thirst.  Dry lips.  Slightly dry mouth.  Dry, warm skin.  Dizziness. Symptoms of moderate dehydration may include:   Very dry mouth.  Muscle cramps.  Dark urine. Urine may be the color of tea.  Decreased urine production.  Decreased tear production.  Heartbeat that is irregular or faster than normal (palpitations).  Headache.  Light-headedness, especially when you stand up from a sitting position.  Fainting (syncope). Symptoms of severe dehydration may include:   Changes in skin, such as:  Cold and clammy skin.  Blotchy (mottled) or pale skin.  Skin that does  not quickly return to normal after being lightly pinched and released (poor skin turgor).  Changes in body fluids, such as:  Extreme thirst.  No tear production.  Inability to sweat when body temperature is high, such as in hot weather.  Very little urine production.  Changes in vital signs, such as:  Weak pulse.  Pulse that is more than 100 beats a minute when sitting still.  Rapid breathing.  Low blood pressure.  Other changes, such as:  Sunken eyes.  Cold hands and feet.  Confusion.  Lack of energy (lethargy).  Difficulty waking up from sleep.  Short-term weight loss.  Unconsciousness. How is this diagnosed? This condition is diagnosed based on your symptoms and a physical exam. Blood and urine tests may be done to help confirm the diagnosis. How is this treated? Treatment for this condition depends on the severity. Mild or moderate dehydration can often be treated at home. Treatment should be started right away. Do not wait until dehydration becomes severe. Severe dehydration is an emergency and it needs to be treated in a hospital. Treatment for mild dehydration may include:   Drinking more fluids.  Replacing salts and minerals in your blood (electrolytes) that you may have lost. Treatment for moderate dehydration may include:   Drinking an oral rehydration solution (ORS). This is a drink that helps you replace fluids and electrolytes (rehydrate). It can be found at pharmacies and retail stores. Treatment for severe dehydration may include:   Receiving fluids through an IV tube.  Receiving an electrolyte solution through a feeding tube that is   passed through your nose and into your stomach (nasogastric tube, or NG tube).  Correcting any abnormalities in electrolytes.  Treating the underlying cause of dehydration. Follow these instructions at home:  If directed by your health care provider, drink an ORS:  Make an ORS by following instructions on the  package.  Start by drinking small amounts, about  cup (120 mL) every 5-10 minutes.  Slowly increase how much you drink until you have taken the amount recommended by your health care provider.  Drink enough clear fluid to keep your urine clear or pale yellow. If you were told to drink an ORS, finish the ORS first, then start slowly drinking other clear fluids. Drink fluids such as:  Water. Do not drink only water. Doing that can lead to having too little salt (sodium) in the body (hyponatremia).  Ice chips.  Fruit juice that you have added water to (diluted fruit juice).  Low-calorie sports drinks.  Avoid:  Alcohol.  Drinks that contain a lot of sugar. These include high-calorie sports drinks, fruit juice that is not diluted, and soda.  Caffeine.  Foods that are greasy or contain a lot of fat or sugar.  Take over-the-counter and prescription medicines only as told by your health care provider.  Do not take sodium tablets. This can lead to having too much sodium in the body (hypernatremia).  Eat foods that contain a healthy balance of electrolytes, such as bananas, oranges, potatoes, tomatoes, and spinach.  Keep all follow-up visits as told by your health care provider. This is important. Contact a health care provider if:  You have abdominal pain that:  Gets worse.  Stays in one area (localizes).  You have a rash.  You have a stiff neck.  You are more irritable than usual.  You are sleepier or more difficult to wake up than usual.  You feel weak or dizzy.  You feel very thirsty.  You have urinated only a small amount of very dark urine over 6-8 hours. Get help right away if:  You have symptoms of severe dehydration.  You cannot drink fluids without vomiting.  Your symptoms get worse with treatment.  You have a fever.  You have a severe headache.  You have vomiting or diarrhea that:  Gets worse.  Does not go away.  You have blood or green matter  (bile) in your vomit.  You have blood in your stool. This may cause stool to look black and tarry.  You have not urinated in 6-8 hours.  You faint.  Your heart rate while sitting still is over 100 beats a minute.  You have trouble breathing. This information is not intended to replace advice given to you by your health care provider. Make sure you discuss any questions you have with your health care provider. Document Released: 04/28/2005 Document Revised: 11/23/2015 Document Reviewed: 06/22/2015 Elsevier Interactive Patient Education  2017 Elsevier Inc.  

## 2016-05-21 ENCOUNTER — Telehealth: Payer: Self-pay | Admitting: *Deleted

## 2016-05-21 ENCOUNTER — Encounter: Payer: Self-pay | Admitting: Hematology and Oncology

## 2016-05-21 NOTE — Progress Notes (Signed)
Faxed ACS application today.

## 2016-05-21 NOTE — Telephone Encounter (Signed)
Oncology Nurse Navigator Documentation  Called Mr. Biggar to check on his well-being since finishing RT last week Thursday.  LVMM asking him to return my call.   Gayleen Orem, RN, BSN, [place] Oncology Nurse [place] at [place] [phone]

## 2016-05-22 ENCOUNTER — Encounter: Payer: Self-pay | Admitting: *Deleted

## 2016-05-22 DIAGNOSIS — C090 Malignant neoplasm of tonsillar fossa: Secondary | ICD-10-CM | POA: Diagnosis not present

## 2016-05-22 DIAGNOSIS — C099 Malignant neoplasm of tonsil, unspecified: Secondary | ICD-10-CM | POA: Diagnosis not present

## 2016-05-22 DIAGNOSIS — I10 Essential (primary) hypertension: Secondary | ICD-10-CM | POA: Diagnosis not present

## 2016-05-22 DIAGNOSIS — Z431 Encounter for attention to gastrostomy: Secondary | ICD-10-CM | POA: Diagnosis not present

## 2016-05-22 DIAGNOSIS — C029 Malignant neoplasm of tongue, unspecified: Secondary | ICD-10-CM | POA: Diagnosis not present

## 2016-05-22 DIAGNOSIS — C801 Malignant (primary) neoplasm, unspecified: Secondary | ICD-10-CM | POA: Diagnosis not present

## 2016-05-22 DIAGNOSIS — M1991 Primary osteoarthritis, unspecified site: Secondary | ICD-10-CM | POA: Diagnosis not present

## 2016-05-22 DIAGNOSIS — E785 Hyperlipidemia, unspecified: Secondary | ICD-10-CM | POA: Diagnosis not present

## 2016-05-23 ENCOUNTER — Telehealth: Payer: Self-pay | Admitting: *Deleted

## 2016-05-23 DIAGNOSIS — C801 Malignant (primary) neoplasm, unspecified: Secondary | ICD-10-CM | POA: Diagnosis not present

## 2016-05-23 DIAGNOSIS — C029 Malignant neoplasm of tongue, unspecified: Secondary | ICD-10-CM | POA: Diagnosis not present

## 2016-05-23 DIAGNOSIS — C099 Malignant neoplasm of tonsil, unspecified: Secondary | ICD-10-CM | POA: Diagnosis not present

## 2016-05-23 NOTE — Telephone Encounter (Signed)
Oncology Nurse Navigator Documentation  Received call from patient wife to provide update on his well-being.  She reported:  Not experiencing any pain, not taking analgesics.  No N&V.  Daily, formed BMs.  Energy level minimal, taking naps.  Using PEG exclusively for nutrition, instilling 6 cans daily, including feeding pump at HS.  He has lost a couple of pounds this past week.  I encouraged additional 2 cans bolus throughout daytime bolus pending his next appt with Nutrition and further guidance from [facility].  Instilling ca 32 oz water daily via PEG.   She voiced understanding of Monday's appts. Note routed to Dr. Alvy Bimler, [place], [place].  Gayleen Orem, RN, BSN, [place] [facility] Oncology Nurse [place] at [place] [phone]

## 2016-05-26 ENCOUNTER — Ambulatory Visit: Payer: BLUE CROSS/BLUE SHIELD

## 2016-05-26 ENCOUNTER — Telehealth: Payer: Self-pay | Admitting: Hematology and Oncology

## 2016-05-26 ENCOUNTER — Ambulatory Visit (HOSPITAL_BASED_OUTPATIENT_CLINIC_OR_DEPARTMENT_OTHER): Payer: BLUE CROSS/BLUE SHIELD

## 2016-05-26 ENCOUNTER — Other Ambulatory Visit (HOSPITAL_BASED_OUTPATIENT_CLINIC_OR_DEPARTMENT_OTHER): Payer: BLUE CROSS/BLUE SHIELD

## 2016-05-26 ENCOUNTER — Ambulatory Visit: Payer: BLUE CROSS/BLUE SHIELD | Admitting: Nutrition

## 2016-05-26 ENCOUNTER — Ambulatory Visit (HOSPITAL_BASED_OUTPATIENT_CLINIC_OR_DEPARTMENT_OTHER): Payer: BLUE CROSS/BLUE SHIELD | Admitting: Hematology and Oncology

## 2016-05-26 ENCOUNTER — Encounter: Payer: Self-pay | Admitting: *Deleted

## 2016-05-26 ENCOUNTER — Encounter: Payer: Self-pay | Admitting: Hematology and Oncology

## 2016-05-26 DIAGNOSIS — C099 Malignant neoplasm of tonsil, unspecified: Secondary | ICD-10-CM

## 2016-05-26 DIAGNOSIS — D539 Nutritional anemia, unspecified: Secondary | ICD-10-CM | POA: Insufficient documentation

## 2016-05-26 DIAGNOSIS — D61818 Other pancytopenia: Secondary | ICD-10-CM

## 2016-05-26 DIAGNOSIS — E440 Moderate protein-calorie malnutrition: Secondary | ICD-10-CM | POA: Diagnosis not present

## 2016-05-26 DIAGNOSIS — R634 Abnormal weight loss: Secondary | ICD-10-CM

## 2016-05-26 LAB — COMPREHENSIVE METABOLIC PANEL
ALT: 24 U/L (ref 0–55)
ANION GAP: 9 meq/L (ref 3–11)
AST: 16 U/L (ref 5–34)
Albumin: 2.9 g/dL — ABNORMAL LOW (ref 3.5–5.0)
Alkaline Phosphatase: 54 U/L (ref 40–150)
BUN: 31.4 mg/dL — ABNORMAL HIGH (ref 7.0–26.0)
CHLORIDE: 101 meq/L (ref 98–109)
CO2: 30 meq/L — AB (ref 22–29)
Calcium: 9.9 mg/dL (ref 8.4–10.4)
Creatinine: 1.2 mg/dL (ref 0.7–1.3)
EGFR: 68 mL/min/{1.73_m2} — AB (ref >90)
Glucose: 149 mg/dl — ABNORMAL HIGH (ref 70–140)
Potassium: 4.1 mEq/L (ref 3.5–5.1)
SODIUM: 140 meq/L (ref 136–145)
Total Bilirubin: 0.26 mg/dL (ref 0.20–1.20)
Total Protein: 6.5 g/dL (ref 6.4–8.3)

## 2016-05-26 LAB — CBC WITH DIFFERENTIAL/PLATELET
BASO%: 0.7 % (ref 0.0–2.0)
BASOS ABS: 0 10*3/uL (ref 0.0–0.1)
EOS ABS: 0 10*3/uL (ref 0.0–0.5)
EOS%: 1.6 % (ref 0.0–7.0)
HEMATOCRIT: 27.6 % — AB (ref 38.4–49.9)
HEMOGLOBIN: 9.4 g/dL — AB (ref 13.0–17.1)
LYMPH#: 0.2 10*3/uL — AB (ref 0.9–3.3)
LYMPH%: 9.7 % — ABNORMAL LOW (ref 14.0–49.0)
MCH: 29.4 pg (ref 27.2–33.4)
MCHC: 33.9 g/dL (ref 32.0–36.0)
MCV: 86.5 fL (ref 79.3–98.0)
MONO#: 0.5 10*3/uL (ref 0.1–0.9)
MONO%: 23.5 % — ABNORMAL HIGH (ref 0.0–14.0)
NEUT#: 1.5 10*3/uL (ref 1.5–6.5)
NEUT%: 64.5 % (ref 39.0–75.0)
PLATELETS: 433 10*3/uL — AB (ref 140–400)
RBC: 3.2 10*6/uL — ABNORMAL LOW (ref 4.20–5.82)
RDW: 14.4 % (ref 11.0–14.6)
WBC: 2.3 10*3/uL — ABNORMAL LOW (ref 4.0–10.3)

## 2016-05-26 LAB — MAGNESIUM: MAGNESIUM: 2 mg/dL (ref 1.5–2.5)

## 2016-05-26 MED ORDER — SODIUM CHLORIDE 0.9% FLUSH
10.0000 mL | INTRAVENOUS | Status: DC | PRN
  Administered 2016-05-26: 10 mL
  Filled 2016-05-26: qty 10

## 2016-05-26 MED ORDER — SODIUM CHLORIDE 0.9% FLUSH
10.0000 mL | Freq: Once | INTRAVENOUS | Status: AC
  Administered 2016-05-26: 10 mL
  Filled 2016-05-26: qty 10

## 2016-05-26 MED ORDER — HEPARIN SOD (PORK) LOCK FLUSH 100 UNIT/ML IV SOLN
250.0000 [IU] | Freq: Once | INTRAVENOUS | Status: DC | PRN
  Filled 2016-05-26: qty 5

## 2016-05-26 MED ORDER — SODIUM CHLORIDE 0.9 % IV SOLN: Freq: Once | INTRAVENOUS | Status: DC

## 2016-05-26 MED ORDER — ONDANSETRON HCL 4 MG/2ML IJ SOLN
8.0000 mg | Freq: Once | INTRAMUSCULAR | Status: AC
  Administered 2016-05-26: 8 mg via INTRAVENOUS

## 2016-05-26 MED ORDER — ONDANSETRON HCL 4 MG/2ML IJ SOLN
INTRAMUSCULAR | Status: AC
  Filled 2016-05-26: qty 4

## 2016-05-26 MED ORDER — SODIUM CHLORIDE 0.9 % IV SOLN
1000.0000 mL | Freq: Once | INTRAVENOUS | Status: AC
  Administered 2016-05-26: 1000 mL via INTRAVENOUS

## 2016-05-26 MED ORDER — SODIUM CHLORIDE 0.9% FLUSH
10.0000 mL | Freq: Once | INTRAVENOUS | Status: DC
  Filled 2016-05-26: qty 10

## 2016-05-26 MED ORDER — HEPARIN SOD (PORK) LOCK FLUSH 100 UNIT/ML IV SOLN
500.0000 [IU] | Freq: Once | INTRAVENOUS | Status: AC | PRN
Start: 2016-05-26 — End: 2016-05-26
  Administered 2016-05-26: 500 [IU]
  Filled 2016-05-26: qty 5

## 2016-05-26 MED ORDER — ALTEPLASE 2 MG IJ SOLR
2.0000 mg | Freq: Once | INTRAMUSCULAR | Status: DC | PRN
  Filled 2016-05-26: qty 2

## 2016-05-26 NOTE — Telephone Encounter (Signed)
Gave patient avs report and appointments for January and February  °

## 2016-05-26 NOTE — Progress Notes (Signed)
[place] OFFICE PROGRESS NOTE  Patient Care Team: Haywood Pao, MD as PCP - General (Internal Medicine) Leota Sauers, RN as Oncology Nurse Navigator Eppie Gibson, MD as Attending Physician (Radiation Oncology) Heath Lark, MD as Consulting Physician (Hematology and Oncology) Karie Mainland, RD as Dietitian (Nutrition)  SUMMARY OF ONCOLOGIC HISTORY:   Tonsil cancer ([place])   02/07/2016 Imaging    Ct neck showed abnormal soft tissue within the left pharynx, extending from the left tonsillar pillar inferiorly to the level of the hyoid and filling the left vallecula. The appearance is most consistent with a neoplastic process. 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 Histologic sampling and direct visualization should be considered. Confluent adenopathy within the left cervical chain with areas of central hypoattenuation suggesting degrees of necrosis. This is most suggestive of metastatic spread. The areas of necrosis within the adenopathy combined with the above-described pharyngeal soft tissue mass are together suggestive of squamous cell carcinoma.      02/14/2016 Procedure    He has FNA of left neck LN       02/14/2016 Pathology Results    [id number] cytology confirmed squamous cell carcinoma      02/26/2016 PET scan    Hypermetabolic left tonsillar in pharyngeal mass with hypermetabolic left sided station IA, station III, and station Ib adenopathy and hypermetabolic station IIa adenopathy on the right. Borderline hypermetabolic small right station IIb lymph node. Several tiny lung nodules are observed, these are not hypermetabolic but are also below the threshold level for accurate characterization. Dedicated CT chest may be warranted to act as a baseline.      03/20/2016 Procedure    Placement of a right internal jugular approach power injectable Port-A-Cath. Fluoroscopic insertion of a 20-French pull-through gastrostomy tube.        03/24/2016 - 05/06/2016 Chemotherapy    He received high dose cisplatin; dose 2 and 3 with dose reduction due to side-effects      03/24/2016 - 05/15/2016 Radiation Therapy    He received concurrent radiation       INTERVAL HISTORY: Please see below for problem oriented charting. He returns today with his wife The patient continues to have significant mucus production causing gagging and difficulties with swallowing He said he has been spitting up every 15 minutes He had difficulty swallowing even liquids as it causes him to gag and spit out later He has 0 intake by mouth He is 100% dependent on his feeding tube He continues to have progressive weight loss He denies pain, rated his throat pain at 2 out of 10 He has not started using Reglan He denies constipation  REVIEW OF SYSTEMS:   Constitutional: Denies fevers, chills or abnormal weight loss Eyes: Denies blurriness of vision Respiratory: Denies cough, dyspnea or wheezes Cardiovascular: Denies palpitation, chest discomfort or lower extremity swelling Skin: Denies abnormal skin rashes Lymphatics: Denies new lymphadenopathy or easy bruising Neurological:Denies numbness, tingling or new weaknesses Behavioral/Psych: Mood is stable, no new changes  All other systems were reviewed with the patient and are negative.  I have reviewed the past medical history, past surgical history, social history and family history with the patient and they are unchanged from previous note.  ALLERGIES:  is allergic to phenergan [promethazine hcl].  MEDICATIONS:  Current Outpatient Prescriptions  Medication Sig Dispense Refill  . fenofibrate (TRICOR) 145 MG tablet Take 145 mg by mouth daily.     [name] lactulose (CHRONULAC) 10 GM/15ML solution Place 15 mLs (10  g total) into feeding tube 3 (three) times daily. 240 mL 0  . levothyroxine (SYNTHROID, LEVOTHROID) 50 MCG tablet Take 50 mcg by mouth daily before breakfast.     . lidocaine (XYLOCAINE) 2 %  solution Patient: Mix 1part 2% viscous lidocaine, 1part H20. Swish/swallow 51mL of this mixture, 29min before meals and at bedtime, up to QID 200 mL 3  . lidocaine-prilocaine (EMLA) cream Apply to affected area once 30 g 3  . metoCLOPramide (REGLAN) 5 MG/5ML solution Take 10 mLs (10 mg total) by mouth 4 (four) times daily -  before meals and at bedtime. 240 mL 11  . Nutritional Supplements (FEEDING SUPPLEMENT, OSMOLITE 1.5 CAL,) LIQD Give 1 can Osmolite 1.5 Via PEG TID with 120 cc free water bolus at 0900, 1200, 1500. Begin osmolite 1.5 at 6 pm at 60 cc/hr and run for 12 hours continuously. Flush with 120 cc free water before and after continuous feeding. Send pump and supplies for continuous feeding. 1422 mL 0  . ondansetron (ZOFRAN) 8 MG tablet Take 1 tablet (8 mg total) by mouth 2 (two) times daily as needed. Start on the third day after chemotherapy. 30 tablet 1  . polyethylene glycol (MIRALAX) packet Take 17 g by mouth daily. 14 each 0  . prochlorperazine (COMPAZINE) 10 MG tablet Take 1 tablet (10 mg total) by mouth every 6 (six) hours as needed (Nausea or vomiting). 30 tablet 1  . sodium fluoride (FLUORISHIELD) 1.1 % GEL dental gel Instill one drop of gel per tooth space of fluoride tray. Place over teeth for 5 minutes. Remove. Spit out excess. Repeat nightly. 120 mL prn   No current facility-administered medications for this visit.     PHYSICAL EXAMINATION: ECOG PERFORMANCE STATUS: 1 - Symptomatic but completely ambulatory  Vitals:   05/26/16 1212  BP: (!) 141/77  Pulse: 75  Resp: 18  Temp: 97.9 F (36.6 C)   Filed Weights   05/26/16 1212  Weight: 157 lb 4.8 oz (71.4 kg)    GENERAL:alert, no distress and comfortable SKIN: skin color, texture, turgor are normal, no rashes or significant lesions EYES: normal, Conjunctiva are pink and non-injected, sclera clear OROPHARYNX:no exudate, no erythema and lips, buccal mucosa, and tongue normal  NECK: supple, thyroid normal size,  non-tender, without nodularity LYMPH:  no palpable lymphadenopathy in the cervical, axillary or inguinal LUNGS: clear to auscultation and percussion with normal breathing effort HEART: regular rate & rhythm and no murmurs and no lower extremity edema ABDOMEN:abdomen soft, non-tender and normal bowel sounds. Feeding tube site looks okay Musculoskeletal:no cyanosis of digits and no clubbing  NEURO: alert & oriented x 3 with fluent speech, no focal motor/sensory deficits  LABORATORY DATA:  I have reviewed the data as listed    Component Value Date/Time   NA 140 05/26/2016 1129   K 4.1 05/26/2016 1129   CL 96 (L) 05/11/2016 1608   CO2 30 (H) 05/26/2016 1129   GLUCOSE 149 (H) 05/26/2016 1129   BUN 31.4 (H) 05/26/2016 1129   CREATININE 1.2 05/26/2016 1129   CALCIUM 9.9 05/26/2016 1129   PROT 6.5 05/26/2016 1129   ALBUMIN 2.9 (L) 05/26/2016 1129   AST 16 05/26/2016 1129   ALT 24 05/26/2016 1129   ALKPHOS 54 05/26/2016 1129   BILITOT 0.26 05/26/2016 1129   GFRNONAA >60 05/11/2016 1608   GFRAA >60 05/11/2016 1608    No results found for: SPEP, UPEP  Lab Results  Component Value Date   WBC 2.3 (L) 05/26/2016  NEUTROABS 1.5 05/26/2016   HGB 9.4 (L) 05/26/2016   HCT 27.6 (L) 05/26/2016   MCV 86.5 05/26/2016   PLT 433 (H) 05/26/2016      Chemistry      Component Value Date/Time   NA 140 05/26/2016 1129   K 4.1 05/26/2016 1129   CL 96 (L) 05/11/2016 1608   CO2 30 (H) 05/26/2016 1129   BUN 31.4 (H) 05/26/2016 1129   CREATININE 1.2 05/26/2016 1129      Component Value Date/Time   CALCIUM 9.9 05/26/2016 1129   ALKPHOS 54 05/26/2016 1129   AST 16 05/26/2016 1129   ALT 24 05/26/2016 1129   BILITOT 0.26 05/26/2016 1129       RADIOGRAPHIC STUDIES: I have personally reviewed the radiological images as listed and agreed with the findings in the report. Dg Chest 2 View  Result Date: 05/11/2016 CLINICAL DATA:  No bowel movement in 4 days.  Vomiting. EXAM: CHEST  2 VIEW  COMPARISON:  None. FINDINGS: Right Port-A-Cath is in place with tip in the SVC. Heart and mediastinal contours are within normal limits. No focal opacities or effusions. No acute bony abnormality. IMPRESSION: No active cardiopulmonary disease. Electronically Signed   By: Rolm Baptise M.D.   On: 05/11/2016 16:07   Ct Abdomen Pelvis W Contrast  Result Date: 05/11/2016 CLINICAL DATA:  Patient with constipation and indigestion. EXAM: CT ABDOMEN AND PELVIS WITH CONTRAST TECHNIQUE: Multidetector CT imaging of the abdomen and pelvis was performed using the standard protocol following bolus administration of intravenous contrast. CONTRAST:  114mL ISOVUE-300 IOPAMIDOL (ISOVUE-300) INJECTION 61% COMPARISON:  PET-CT 02/26/2016. FINDINGS: Lower chest: Normal heart size. No consolidative pulmonary opacities. No pleural effusion. Minimal atelectasis within the lung bases. Hepatobiliary: Liver is normal in size and contour. 5 mm too small to characterize low-attenuation lesion right hepatic lobe (image 27; series 2). Gallbladder is unremarkable. No intrahepatic or extrahepatic biliary ductal dilatation. Pancreas: Unremarkable Spleen: Indeterminate 10 mm enhancing lesion within the spleen (image 17; series 2). Adrenals/Urinary Tract: The adrenal glands are normal. Kidneys enhance symmetrically with contrast. No hydronephrosis. Urinary bladder is decompressed. Stomach/Bowel: Sigmoid colonic diverticulosis. No CT evidence for acute diverticulitis. Percutaneous gastrostomy tube present and appears appropriately located. Normal morphology of the stomach. No evidence for bowel obstruction. No free fluid or free intraperitoneal air. Vascular/Lymphatic: Normal caliber abdominal aorta. No retroperitoneal lymphadenopathy. Reproductive: Prostate is enlarged. Central dystrophic calcifications. Other: No abdominal wall hernia or abnormality. No abdominopelvic ascites. Musculoskeletal: Lower thoracic and lumbar spine degenerative changes.  No aggressive or acute appearing osseous lesions. IMPRESSION: No acute process within the abdomen or pelvis. Indeterminate 10 mm enhancing lesion within the spleen. Recommend attention on follow-up. Electronically Signed   By: Lovey Newcomer M.D.   On: 05/11/2016 17:49    ASSESSMENT & PLAN:  Tonsil cancer [facility]) He has completed all chemotherapy and radiation therapy He is experiencing worsening side effects from treatment. He is not improving and is still losing weight I plan to see him back in 2 weeks but recommend we resume IV fluid support 3 times a week  Pancytopenia, acquired ([place]) The patient had persistent pancytopenia He is not improving He is malnourished. I will check serum vitamin B-12 in his next visit For now, he is not symptomatic and does not require blood transfusion for anemia or antibiotics for mild leukopenia  Protein-calorie malnutrition, moderate ([facility]) He has progressive weight loss every week despite following up on recommendation from dietitian He is not able to swallow anything by mouth  and anything he tries to swallow, he ended up spitting it out I recommend close follow-up with dietitian weekly I plan to support him with IV fluids in hope that that would reduce his oral liquid requirements I recommend repeat evaluation with speech and language therapist to make sure he is not developing any form of oropharyngeal dysphagia the prohibits him from swallowing   No orders of the defined types were placed in this encounter.  All questions were answered. The patient knows to call the clinic with any problems, questions or concerns. No barriers to learning was detected. I spent 20 minutes counseling the patient face to face. The total time spent in the appointment was 30 minutes and more than 50% was on counseling and review of test results     Heath Lark, MD 05/26/2016 12:58 PM

## 2016-05-26 NOTE — Patient Instructions (Signed)

## 2016-05-26 NOTE — Progress Notes (Signed)
Oncology Nurse Navigator Documentation  Met with patient during Established Patient appt with Dr. Alvy Bimler.  He was accompanied by his wife. He reported:  Minimal throat pain, no fevers.  Copious amounts of mucous, thickened saliva.  Causes nausea, occasional emesis.  Using feeding pump during HS, instilling 4 cans @ 80cc/hr.  Administering 2 cans by bolus during day. He reported today with a 4.3 lb weight loss during the past 2 weeks.  I arranged for Dory Peru, RD, to visit patient while receiving IVF after this appt. He has not seen SLP since 03/04/16.  I will arrange appt. He agreed to TIW IVF for the next 3 weeks, Tues, Thurs, Sat. They know to contact me with needs/concerns.  Gayleen Orem, RN, BSN, [place] Oncology Nurse [place] [facility] [place] [phone]

## 2016-05-26 NOTE — Assessment & Plan Note (Signed)
He has progressive weight loss every week despite following up on recommendation from dietitian He is not able to swallow anything by mouth and anything he tries to swallow, he ended up spitting it out I recommend close follow-up with dietitian weekly I plan to support him with IV fluids in hope that that would reduce his oral liquid requirements I recommend repeat evaluation with speech and language therapist to make sure he is not developing any form of oropharyngeal dysphagia the prohibits him from swallowing

## 2016-05-26 NOTE — Assessment & Plan Note (Signed)
He has completed all chemotherapy and radiation therapy He is experiencing worsening side effects from treatment. He is not improving and is still losing weight I plan to see him back in 2 weeks but recommend we resume IV fluid support 3 times a week

## 2016-05-26 NOTE — Progress Notes (Signed)
Nutrition follow-up completed with patient during IV fluids for tonsil cancer. Weight decreased and documented as 157 pounds January 15, down from 158 pounds on January 3. Patient is down 7 pounds since December 28. Patient is tolerating 6 cans of Osmolite 1.5 using a combination of continuous and bolus tube feeding. He has only been tolerating this for 4 days. Patient was constipated.  However, this is now resolved after magnesium citrate. He has not been using Reglan as prescribed. Patient scheduled for IV fluids.  Nutrition diagnosis: Inadequate oral intake and severe malnutrition both continue.  Estimated nutrition needs: 2225-2425 calories, 95-115 grams protein, 2.3 L fluid.  Intervention: Educated patient to increase Osmolite 1.5 to 100 cc an hour for 12 hours and continue bolus Osmolite 1.5 twice a day with 120 cc free water before and after feedings This will provide 2485 cal, 104.3 g protein, 1987 mL free water, which is 100% of estimated nutrition needs. Patient agrees to try to increase tube feeding to promote weight gain. Patient agrees to follow-up with me this week. Recommended patient continue with bowel regimen. Encouraged patient to take Reglan as prescribed for increased gastric motility. Encouraged patient to work to begin swallowing water and eventually liquids as tolerated. Questions were answered.  Teach back method used.  Monitoring, evaluation, goals:  Patient will tolerate 7 cans of Osmolite 1.5 to provide 100% estimated nutrition needs. Patient will maintain or gain weight.  Next visit: Tuesday, January 23, during IV fluids.  Patient will follow-up by phone as needed.  **Disclaimer: This note was dictated with voice recognition software. Similar sounding words can inadvertently be transcribed and this note may contain transcription errors which may not have been corrected upon publication of note.**

## 2016-05-26 NOTE — Assessment & Plan Note (Signed)
The patient had persistent pancytopenia He is not improving He is malnourished. I will check serum vitamin B-12 in his next visit For now, he is not symptomatic and does not require blood transfusion for anemia or antibiotics for mild leukopenia

## 2016-05-26 NOTE — Patient Instructions (Signed)
Dehydration, Adult Dehydration is a condition in which there is not enough fluid or water in the body. This happens when you lose more fluids than you take in. Important organs, such as the kidneys, brain, and heart, cannot function without a proper amount of fluids. Any loss of fluids from the body can lead to dehydration. Dehydration can range from mild to severe. This condition should be treated right away to prevent it from becoming severe. What are the causes? This condition may be caused by:  Vomiting.  Diarrhea.  Excessive sweating, such as from heat exposure or exercise.  Not drinking enough fluid, especially:  When ill.  While doing activity that requires a lot of energy.  Excessive urination.  Fever.  Infection.  Certain medicines, such as medicines that cause the body to lose excess fluid (diuretics).  Inability to access safe drinking water.  Reduced physical ability to get adequate water and food. What increases the risk? This condition is more likely to develop in people:  Who have a poorly controlled long-term (chronic) illness, such as diabetes, heart disease, or kidney disease.  Who are age 65 or older.  Who are disabled.  Who live in a place with high altitude.  Who play endurance sports. What are the signs or symptoms? Symptoms of mild dehydration may include:   Thirst.  Dry lips.  Slightly dry mouth.  Dry, warm skin.  Dizziness. Symptoms of moderate dehydration may include:   Very dry mouth.  Muscle cramps.  Dark urine. Urine may be the color of tea.  Decreased urine production.  Decreased tear production.  Heartbeat that is irregular or faster than normal (palpitations).  Headache.  Light-headedness, especially when you stand up from a sitting position.  Fainting (syncope). Symptoms of severe dehydration may include:   Changes in skin, such as:  Cold and clammy skin.  Blotchy (mottled) or pale skin.  Skin that does  not quickly return to normal after being lightly pinched and released (poor skin turgor).  Changes in body fluids, such as:  Extreme thirst.  No tear production.  Inability to sweat when body temperature is high, such as in hot weather.  Very little urine production.  Changes in vital signs, such as:  Weak pulse.  Pulse that is more than 100 beats a minute when sitting still.  Rapid breathing.  Low blood pressure.  Other changes, such as:  Sunken eyes.  Cold hands and feet.  Confusion.  Lack of energy (lethargy).  Difficulty waking up from sleep.  Short-term weight loss.  Unconsciousness. How is this diagnosed? This condition is diagnosed based on your symptoms and a physical exam. Blood and urine tests may be done to help confirm the diagnosis. How is this treated? Treatment for this condition depends on the severity. Mild or moderate dehydration can often be treated at home. Treatment should be started right away. Do not wait until dehydration becomes severe. Severe dehydration is an emergency and it needs to be treated in a hospital. Treatment for mild dehydration may include:   Drinking more fluids.  Replacing salts and minerals in your blood (electrolytes) that you may have lost. Treatment for moderate dehydration may include:   Drinking an oral rehydration solution (ORS). This is a drink that helps you replace fluids and electrolytes (rehydrate). It can be found at pharmacies and retail stores. Treatment for severe dehydration may include:   Receiving fluids through an IV tube.  Receiving an electrolyte solution through a feeding tube that is   passed through your nose and into your stomach (nasogastric tube, or NG tube).  Correcting any abnormalities in electrolytes.  Treating the underlying cause of dehydration. Follow these instructions at home:  If directed by your health care provider, drink an ORS:  Make an ORS by following instructions on the  package.  Start by drinking small amounts, about  cup (120 mL) every 5-10 minutes.  Slowly increase how much you drink until you have taken the amount recommended by your health care provider.  Drink enough clear fluid to keep your urine clear or pale yellow. If you were told to drink an ORS, finish the ORS first, then start slowly drinking other clear fluids. Drink fluids such as:  Water. Do not drink only water. Doing that can lead to having too little salt (sodium) in the body (hyponatremia).  Ice chips.  Fruit juice that you have added water to (diluted fruit juice).  Low-calorie sports drinks.  Avoid:  Alcohol.  Drinks that contain a lot of sugar. These include high-calorie sports drinks, fruit juice that is not diluted, and soda.  Caffeine.  Foods that are greasy or contain a lot of fat or sugar.  Take over-the-counter and prescription medicines only as told by your health care provider.  Do not take sodium tablets. This can lead to having too much sodium in the body (hypernatremia).  Eat foods that contain a healthy balance of electrolytes, such as bananas, oranges, potatoes, tomatoes, and spinach.  Keep all follow-up visits as told by your health care provider. This is important. Contact a health care provider if:  You have abdominal pain that:  Gets worse.  Stays in one area (localizes).  You have a rash.  You have a stiff neck.  You are more irritable than usual.  You are sleepier or more difficult to wake up than usual.  You feel weak or dizzy.  You feel very thirsty.  You have urinated only a small amount of very dark urine over 6-8 hours. Get help right away if:  You have symptoms of severe dehydration.  You cannot drink fluids without vomiting.  Your symptoms get worse with treatment.  You have a fever.  You have a severe headache.  You have vomiting or diarrhea that:  Gets worse.  Does not go away.  You have blood or green matter  (bile) in your vomit.  You have blood in your stool. This may cause stool to look black and tarry.  You have not urinated in 6-8 hours.  You faint.  Your heart rate while sitting still is over 100 beats a minute.  You have trouble breathing. This information is not intended to replace advice given to you by your health care provider. Make sure you discuss any questions you have with your health care provider. Document Released: 04/28/2005 Document Revised: 11/23/2015 Document Reviewed: 06/22/2015 Elsevier Interactive Patient Education  2017 Elsevier Inc.  

## 2016-05-27 ENCOUNTER — Encounter: Payer: Self-pay | Admitting: Radiation Oncology

## 2016-05-27 NOTE — Progress Notes (Signed)
Paperwork ([place]) received 1/16, given to nurse

## 2016-05-29 ENCOUNTER — Telehealth: Payer: Self-pay | Admitting: *Deleted

## 2016-05-29 ENCOUNTER — Ambulatory Visit: Payer: Self-pay | Admitting: Nurse Practitioner

## 2016-05-29 NOTE — Progress Notes (Signed)
Todd [name] is here for follow up to radiation completed 05/15/16 to his Left Tonsil and Bilateral Neck.   Pain issues, if any: No, he denies Using a feeding tube?: Yes, he is instilling 7 cans daily of Osmolite. This is received through continuous feeds at night, and several boluses during the day. He increased this to 7 cans on Monday 05/26/16 because of weight loss and advice from Dr. Alvy Bimler and Dory Peru RD. Weight changes, if any: He reports his scale at home has remained at 155.0 lbs this week.  Wt Readings from Last 3 Encounters:  05/30/16 155 lb 3.2 oz (70.4 kg)  05/26/16 157 lb 4.8 oz (71.4 kg)  05/15/16 161 lb 9.6 oz (73.3 kg)   Swallowing issues, if any: Yes. He is only swallowing ice chips at this time. He is swallowing his saliva. He will be seen in the Head and Neck clinic this Tuesday by Glendell Docker with Speech Therapy.  Smoking or chewing tobacco? No Using fluoride trays daily?  Last ENT visit was on:  Other notable issues, if any: He reports improvement with his thick saliva over the past week. He reports a decreased energy level, but also has difficulty sleeping at night. He is receiving IV Fluids 3 days a week for 2 more weeks per Dr. Alvy Bimler.   BP 117/77   Pulse 75   Temp 98.4 F (36.9 C)   Ht 5\' 10"  (1.778 m)   Wt 155 lb 3.2 oz (70.4 kg)   SpO2 100% Comment: room air  BMI 22.27 kg/m

## 2016-05-29 NOTE — Telephone Encounter (Signed)
Oncology Nurse Navigator Documentation  Confirmed with Mr. Fineran wife his attendance at next Tuesday morning's H&N [place], provide an 0800 arrival to Radiation Waiting following lobby registration.  Gayleen Orem, RN, BSN, [place] Oncology Nurse [place] [facility] [place] [phone]

## 2016-05-29 NOTE — Telephone Encounter (Signed)
TC Tpatient's home. Spoke with pt's wife cindy.. She states they will not be here today for IV fluids d/t weather-cannot get out of their driveway.  He does have appt with Dr. Lanell Persons tomorrow and is asking if he can get IV fluids tomorrow since he will not get them today. Advised pt that we can give him fluids tomorrow after his appt with Dr. Lanell Persons.    Cancelled today's IVF appt and rescheduled for tomorrow.

## 2016-05-30 ENCOUNTER — Encounter: Payer: Self-pay | Admitting: Radiation Oncology

## 2016-05-30 ENCOUNTER — Ambulatory Visit
Admission: RE | Admit: 2016-05-30 | Discharge: 2016-05-30 | Disposition: A | Discharge status: Home / Self Care | Payer: BLUE CROSS/BLUE SHIELD | Source: Ambulatory Visit | Attending: Radiation Oncology | Admitting: Radiation Oncology

## 2016-05-30 ENCOUNTER — Ambulatory Visit (HOSPITAL_BASED_OUTPATIENT_CLINIC_OR_DEPARTMENT_OTHER): Payer: BLUE CROSS/BLUE SHIELD | Admitting: Nurse Practitioner

## 2016-05-30 ENCOUNTER — Telehealth: Payer: Self-pay | Admitting: *Deleted

## 2016-05-30 VITALS — BP 117/77 | HR 75 | Temp 98.4°F | Ht 70.0 in | Wt 155.2 lb

## 2016-05-30 VITALS — BP 122/80 | HR 72 | Temp 97.8°F | Resp 18 | Ht 70.0 in | Wt 157.4 lb

## 2016-05-30 DIAGNOSIS — C090 Malignant neoplasm of tonsillar fossa: Secondary | ICD-10-CM | POA: Insufficient documentation

## 2016-05-30 DIAGNOSIS — E860 Dehydration: Secondary | ICD-10-CM

## 2016-05-30 DIAGNOSIS — C099 Malignant neoplasm of tonsil, unspecified: Secondary | ICD-10-CM

## 2016-05-30 DIAGNOSIS — Z5189 Encounter for other specified aftercare: Secondary | ICD-10-CM | POA: Insufficient documentation

## 2016-05-30 DIAGNOSIS — Z923 Personal history of irradiation: Secondary | ICD-10-CM | POA: Diagnosis not present

## 2016-05-30 DIAGNOSIS — R634 Abnormal weight loss: Secondary | ICD-10-CM

## 2016-05-30 HISTORY — DX: Personal history of irradiation: Z92.3

## 2016-05-30 MED ORDER — SODIUM CHLORIDE 0.9 % IV SOLN
1000.0000 mL | Freq: Once | INTRAVENOUS | Status: AC
  Administered 2016-05-30: 1000 mL via INTRAVENOUS

## 2016-05-30 MED ORDER — HEPARIN SOD (PORK) LOCK FLUSH 100 UNIT/ML IV SOLN
500.0000 [IU] | Freq: Once | INTRAVENOUS | Status: AC | PRN
  Administered 2016-05-30: 500 [IU]
  Filled 2016-05-30: qty 5

## 2016-05-30 MED ORDER — SODIUM CHLORIDE 0.9% FLUSH
10.0000 mL | INTRAVENOUS | Status: DC | PRN
  Administered 2016-05-30: 10 mL
  Filled 2016-05-30: qty 10

## 2016-05-30 NOTE — Telephone Encounter (Signed)
xxxx 

## 2016-05-30 NOTE — Patient Instructions (Signed)
Dehydration, Adult Dehydration is a condition in which there is not enough fluid or water in the body. This happens when you lose more fluids than you take in. Important organs, such as the kidneys, brain, and heart, cannot function without a proper amount of fluids. Any loss of fluids from the body can lead to dehydration. Dehydration can range from mild to severe. This condition should be treated right away to prevent it from becoming severe. What are the causes? This condition may be caused by:  Vomiting.  Diarrhea.  Excessive sweating, such as from heat exposure or exercise.  Not drinking enough fluid, especially:  When ill.  While doing activity that requires a lot of energy.  Excessive urination.  Fever.  Infection.  Certain medicines, such as medicines that cause the body to lose excess fluid (diuretics).  Inability to access safe drinking water.  Reduced physical ability to get adequate water and food. What increases the risk? This condition is more likely to develop in people:  Who have a poorly controlled long-term (chronic) illness, such as diabetes, heart disease, or kidney disease.  Who are age 65 or older.  Who are disabled.  Who live in a place with high altitude.  Who play endurance sports. What are the signs or symptoms? Symptoms of mild dehydration may include:   Thirst.  Dry lips.  Slightly dry mouth.  Dry, warm skin.  Dizziness. Symptoms of moderate dehydration may include:   Very dry mouth.  Muscle cramps.  Dark urine. Urine may be the color of tea.  Decreased urine production.  Decreased tear production.  Heartbeat that is irregular or faster than normal (palpitations).  Headache.  Light-headedness, especially when you stand up from a sitting position.  Fainting (syncope). Symptoms of severe dehydration may include:   Changes in skin, such as:  Cold and clammy skin.  Blotchy (mottled) or pale skin.  Skin that does  not quickly return to normal after being lightly pinched and released (poor skin turgor).  Changes in body fluids, such as:  Extreme thirst.  No tear production.  Inability to sweat when body temperature is high, such as in hot weather.  Very little urine production.  Changes in vital signs, such as:  Weak pulse.  Pulse that is more than 100 beats a minute when sitting still.  Rapid breathing.  Low blood pressure.  Other changes, such as:  Sunken eyes.  Cold hands and feet.  Confusion.  Lack of energy (lethargy).  Difficulty waking up from sleep.  Short-term weight loss.  Unconsciousness. How is this diagnosed? This condition is diagnosed based on your symptoms and a physical exam. Blood and urine tests may be done to help confirm the diagnosis. How is this treated? Treatment for this condition depends on the severity. Mild or moderate dehydration can often be treated at home. Treatment should be started right away. Do not wait until dehydration becomes severe. Severe dehydration is an emergency and it needs to be treated in a hospital. Treatment for mild dehydration may include:   Drinking more fluids.  Replacing salts and minerals in your blood (electrolytes) that you may have lost. Treatment for moderate dehydration may include:   Drinking an oral rehydration solution (ORS). This is a drink that helps you replace fluids and electrolytes (rehydrate). It can be found at pharmacies and retail stores. Treatment for severe dehydration may include:   Receiving fluids through an IV tube.  Receiving an electrolyte solution through a feeding tube that is   passed through your nose and into your stomach (nasogastric tube, or NG tube).  Correcting any abnormalities in electrolytes.  Treating the underlying cause of dehydration. Follow these instructions at home:  If directed by your health care provider, drink an ORS:  Make an ORS by following instructions on the  package.  Start by drinking small amounts, about  cup (120 mL) every 5-10 minutes.  Slowly increase how much you drink until you have taken the amount recommended by your health care provider.  Drink enough clear fluid to keep your urine clear or pale yellow. If you were told to drink an ORS, finish the ORS first, then start slowly drinking other clear fluids. Drink fluids such as:  Water. Do not drink only water. Doing that can lead to having too little salt (sodium) in the body (hyponatremia).  Ice chips.  Fruit juice that you have added water to (diluted fruit juice).  Low-calorie sports drinks.  Avoid:  Alcohol.  Drinks that contain a lot of sugar. These include high-calorie sports drinks, fruit juice that is not diluted, and soda.  Caffeine.  Foods that are greasy or contain a lot of fat or sugar.  Take over-the-counter and prescription medicines only as told by your health care provider.  Do not take sodium tablets. This can lead to having too much sodium in the body (hypernatremia).  Eat foods that contain a healthy balance of electrolytes, such as bananas, oranges, potatoes, tomatoes, and spinach.  Keep all follow-up visits as told by your health care provider. This is important. Contact a health care provider if:  You have abdominal pain that:  Gets worse.  Stays in one area (localizes).  You have a rash.  You have a stiff neck.  You are more irritable than usual.  You are sleepier or more difficult to wake up than usual.  You feel weak or dizzy.  You feel very thirsty.  You have urinated only a small amount of very dark urine over 6-8 hours. Get help right away if:  You have symptoms of severe dehydration.  You cannot drink fluids without vomiting.  Your symptoms get worse with treatment.  You have a fever.  You have a severe headache.  You have vomiting or diarrhea that:  Gets worse.  Does not go away.  You have blood or green matter  (bile) in your vomit.  You have blood in your stool. This may cause stool to look black and tarry.  You have not urinated in 6-8 hours.  You faint.  Your heart rate while sitting still is over 100 beats a minute.  You have trouble breathing. This information is not intended to replace advice given to you by your health care provider. Make sure you discuss any questions you have with your health care provider. Document Released: 04/28/2005 Document Revised: 11/23/2015 Document Reviewed: 06/22/2015 Elsevier Interactive Patient Education  2017 Elsevier Inc.  

## 2016-05-30 NOTE — Progress Notes (Signed)
Radiation Oncology         [phone] ________________________________  Name: Todd [name] MRN: [id number]  Date: 05/30/2016  DOB: August 31, 1960  Follow-Up Visit Note  CC: Haywood Pao, MD  Fredricka Bonine, *  Diagnosis and Prior Radiotherapy:       ICD-9-CM ICD-10-CM   1. Carcinoma of tonsillar fossa ([place]) 146.1 C09.0     CHIEF COMPLAINT:  Here for follow-up and surveillance of Tonsil cancer 03/24/16 - 05/15/16 : Tonsillar Fossa treated to 70 Gy  Narrative:  The patient returns today for routine follow-up of radiation completed to his Left Tonsil and Bilateral Neck on 05/15/16.  On review of systems, the patient denies pain. He reports he is continuing to instill 7 cans of Osmolite through his feeding tube daily. This is received through continuous feeds at night and several boluses during the day. He recently increased this intake because of weight loss and advice form Dr. Alvy Bimler. The patient reports stable weight at this time based on readings taken form a home scale. The patient reports he is only swallowing ice chips at this time. He is able to swallow his saliva. He denies chewing or smoking tobacco. The patient reports a decreased energy level, with difficulties sleeping at night. He is currently receiving IV fluids 3 days a week, and will continue this for the next 2 weeks per Dr. Alvy Bimler. The patient will be seen in the Head and Neck clinic 06/03/16 for Speech Therapy.   ALLERGIES:  is allergic to phenergan [promethazine hcl].  Meds: Current Outpatient Prescriptions  Medication Sig Dispense Refill  . fenofibrate (TRICOR) 145 MG tablet Take 145 mg by mouth daily.     [name] levothyroxine (SYNTHROID, LEVOTHROID) 50 MCG tablet Take 50 mcg by mouth daily before breakfast.     . lidocaine-prilocaine (EMLA) cream Apply to affected area once 30 g 3  . metoCLOPramide (REGLAN) 5 MG/5ML solution Take 10 mLs (10 mg total) by mouth 4 (four) times daily -  before meals and at bedtime.  240 mL 11  . Nutritional Supplements (FEEDING SUPPLEMENT, OSMOLITE 1.5 CAL,) LIQD Give 1 can Osmolite 1.5 Via PEG TID with 120 cc free water bolus at 0900, 1200, 1500. Begin osmolite 1.5 at 6 pm at 60 cc/hr and run for 12 hours continuously. Flush with 120 cc free water before and after continuous feeding. Send pump and supplies for continuous feeding. 1422 mL 0  . lactulose (CHRONULAC) 10 GM/15ML solution Place 15 mLs (10 g total) into feeding tube 3 (three) times daily. (Patient not taking: Reported on 05/30/2016) 240 mL 0  . lidocaine (XYLOCAINE) 2 % solution Patient: Mix 1part 2% viscous lidocaine, 1part H20. Swish/swallow 32mL of this mixture, 60min before meals and at bedtime, up to QID (Patient not taking: Reported on 05/30/2016) 200 mL 3  . ondansetron (ZOFRAN) 8 MG tablet Take 1 tablet (8 mg total) by mouth 2 (two) times daily as needed. Start on the third day after chemotherapy. (Patient not taking: Reported on 05/30/2016) 30 tablet 1  . polyethylene glycol (MIRALAX) packet Take 17 g by mouth daily. (Patient not taking: Reported on 05/30/2016) 14 each 0  . prochlorperazine (COMPAZINE) 10 MG tablet Take 1 tablet (10 mg total) by mouth every 6 (six) hours as needed (Nausea or vomiting). (Patient not taking: Reported on 05/30/2016) 30 tablet 1  . sodium fluoride (FLUORISHIELD) 1.1 % GEL dental gel Instill one drop of gel per tooth space of fluoride tray. Place over teeth for 5 minutes. Remove.  Spit out excess. Repeat nightly. (Patient not taking: Reported on 05/30/2016) 120 mL prn   No current facility-administered medications for this encounter.     Physical Findings: The patient is in no acute distress. Patient is alert and oriented. Wt Readings from Last 3 Encounters:  05/30/16 155 lb 3.2 oz (70.4 kg)  05/26/16 157 lb 4.8 oz (71.4 kg)  05/15/16 161 lb 9.6 oz (73.3 kg)    height is 5\' 10"  (1.778 m) and weight is 155 lb 3.2 oz (70.4 kg). His temperature is 98.4 F (36.9 C). His blood pressure  is 117/77 and his pulse is 75. His oxygen saturation is 100%.  General: Alert and oriented, in no acute distress. HEENT: Oropharynx is notable for resolving confluent mucositis over the soft palate with no evidence of thrush. Copious saliva noted. Neck: Neck is flat with no palpable masses in the cervical or supraclavicular regions. Skin: Skin in treatment fields shows satisfactory healing, with some residual dryness. Lymphatics: see Neck Exam   Lab Findings: Lab Results  Component Value Date   WBC 2.3 (L) 05/26/2016   HGB 9.4 (L) 05/26/2016   HCT 27.6 (L) 05/26/2016   MCV 86.5 05/26/2016   PLT 433 (H) 05/26/2016    No results found for: TSH  Radiographic Findings: Dg Chest 2 View  Result Date: 05/11/2016 CLINICAL DATA:  No bowel movement in 4 days.  Vomiting. EXAM: CHEST  2 VIEW COMPARISON:  None. FINDINGS: Right Port-A-Cath is in place with tip in the SVC. Heart and mediastinal contours are within normal limits. No focal opacities or effusions. No acute bony abnormality. IMPRESSION: No active cardiopulmonary disease. Electronically Signed   By: Rolm Baptise M.D.   On: 05/11/2016 16:07   Ct Abdomen Pelvis W Contrast  Result Date: 05/11/2016 CLINICAL DATA:  Patient with constipation and indigestion. EXAM: CT ABDOMEN AND PELVIS WITH CONTRAST TECHNIQUE: Multidetector CT imaging of the abdomen and pelvis was performed using the standard protocol following bolus administration of intravenous contrast. CONTRAST:  120mL ISOVUE-300 IOPAMIDOL (ISOVUE-300) INJECTION 61% COMPARISON:  PET-CT 02/26/2016. FINDINGS: Lower chest: Normal heart size. No consolidative pulmonary opacities. No pleural effusion. Minimal atelectasis within the lung bases. Hepatobiliary: Liver is normal in size and contour. 5 mm too small to characterize low-attenuation lesion right hepatic lobe (image 27; series 2). Gallbladder is unremarkable. No intrahepatic or extrahepatic biliary ductal dilatation. Pancreas: Unremarkable  Spleen: Indeterminate 10 mm enhancing lesion within the spleen (image 17; series 2). Adrenals/Urinary Tract: The adrenal glands are normal. Kidneys enhance symmetrically with contrast. No hydronephrosis. Urinary bladder is decompressed. Stomach/Bowel: Sigmoid colonic diverticulosis. No CT evidence for acute diverticulitis. Percutaneous gastrostomy tube present and appears appropriately located. Normal morphology of the stomach. No evidence for bowel obstruction. No free fluid or free intraperitoneal air. Vascular/Lymphatic: Normal caliber abdominal aorta. No retroperitoneal lymphadenopathy. Reproductive: Prostate is enlarged. Central dystrophic calcifications. Other: No abdominal wall hernia or abnormality. No abdominopelvic ascites. Musculoskeletal: Lower thoracic and lumbar spine degenerative changes. No aggressive or acute appearing osseous lesions. IMPRESSION: No acute process within the abdomen or pelvis. Indeterminate 10 mm enhancing lesion within the spleen. Recommend attention on follow-up. Electronically Signed   By: Lovey Newcomer M.D.   On: 05/11/2016 17:49    Impression/Plan:    1) Head and Neck Cancer Status: NED  2) Nutritional Status: continue following advice of nutritionist, weight loss noted, but intake is improved. PEG tube: Yes  3) Risk Factors: The patient has been educated about risk factors including tobacco abuse; they understand  that avoidance of tobacco is important to prevent recurrences as well as other cancers.  4) Swallowing: Some difficulty at this time.  SLP following next week.  5) Dental: Encouraged to continue regular followup with dentistry, and dental hygiene including fluoride rinses.   6) Thyroid function: No results found for: TSH Check  TSH in next visit.  7) The patient will continue to follow with Dr. Alvy Bimler. He will follow-up with at PET, TSH, and with me in 3 months. The patient was encouraged to call with any issues or questions before  then.  _____________________________________   Eppie Gibson, MD  This document serves as a record of services personally performed by Eppie Gibson, MD. It was created on her behalf by Maryla Morrow, a trained medical scribe. The creation of this record is based on the scribe's personal observations and the provider's statements to them. This document has been checked and approved by the attending provider.

## 2016-05-30 NOTE — Progress Notes (Signed)
  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 He denied pain throughout treatment. He reported thick saliva developing towards the end of treatment, and took all nutrition through his PEG. Additionally, a Yankauer suction device was delivered to the patient's home to help manage secretions/nausea. The patient received regular IV fluids from medical oncology throughout treatment.  Plan: The patient has completed radiation treatment. The patient will return to radiation oncology clinic for routine followup in one half month. I advised the patient to call or return sooner if any questions or concerns arise that are related to recovery or treatment.  -----------------------------------  Eppie Gibson, MD  This document serves as a record of services personally performed by Eppie Gibson, MD. It was created on her behalf by Maryla Morrow, a trained medical scribe. The creation of this record is based on the scribe's personal observations and the provider's statements to them. This document has been checked and approved by the attending provider.

## 2016-05-31 ENCOUNTER — Ambulatory Visit (HOSPITAL_BASED_OUTPATIENT_CLINIC_OR_DEPARTMENT_OTHER): Payer: BLUE CROSS/BLUE SHIELD

## 2016-05-31 VITALS — BP 120/75 | HR 66 | Temp 98.6°F | Resp 16

## 2016-05-31 DIAGNOSIS — C099 Malignant neoplasm of tonsil, unspecified: Secondary | ICD-10-CM | POA: Diagnosis not present

## 2016-05-31 MED ORDER — HEPARIN SOD (PORK) LOCK FLUSH 100 UNIT/ML IV SOLN
500.0000 [IU] | Freq: Once | INTRAVENOUS | Status: AC | PRN
  Administered 2016-05-31: 500 [IU]
  Filled 2016-05-31: qty 5

## 2016-05-31 MED ORDER — SODIUM CHLORIDE 0.9 % IV SOLN
Freq: Once | INTRAVENOUS | Status: AC
Start: 2016-05-31 — End: 2016-05-31
  Administered 2016-05-31: 09:00:00 via INTRAVENOUS

## 2016-05-31 MED ORDER — SODIUM CHLORIDE 0.9% FLUSH
10.0000 mL | INTRAVENOUS | Status: DC | PRN
Start: 2016-05-31 — End: 2016-05-31
  Administered 2016-05-31: 10 mL
  Filled 2016-05-31: qty 10

## 2016-05-31 NOTE — Patient Instructions (Signed)
Dehydration, Adult Dehydration is when there is not enough fluid or water in your body. This happens when you lose more fluids than you take in. Dehydration can range from mild to very bad. It should be treated right away to keep it from getting very bad. Symptoms of mild dehydration may include:   Thirst.  Dry lips.  Slightly dry mouth.  Dry, warm skin.  Dizziness. Symptoms of moderate dehydration may include:   Very dry mouth.  Muscle cramps.  Dark pee (urine). Pee may be the color of tea.  Your body making less pee.  Your eyes making fewer tears.  Heartbeat that is uneven or faster than normal (palpitations).  Headache.  Light-headedness, especially when you stand up from sitting.  Fainting (syncope). Symptoms of very bad dehydration may include:   Changes in skin, such as:  Cold and clammy skin.  Blotchy (mottled) or pale skin.  Skin that does not quickly return to normal after being lightly pinched and let go (poor skin turgor).  Changes in body fluids, such as:  Feeling very thirsty.  Your eyes making fewer tears.  Not sweating when body temperature is high, such as in hot weather.  Your body making very little pee.  Changes in vital signs, such as:  Weak pulse.  Pulse that is more than 100 beats a minute when you are sitting still.  Fast breathing.  Low blood pressure.  Other changes, such as:  Sunken eyes.  Cold hands and feet.  Confusion.  Lack of energy (lethargy).  Trouble waking up from sleep.  Short-term weight loss.  Unconsciousness. Follow these instructions at home:  If told by your doctor, drink an ORS:  Make an ORS by using instructions on the package.  Start by drinking small amounts, about  cup (120 mL) every 5-10 minutes.  Slowly drink more until you have had the amount that your doctor said to have.  Drink enough clear fluid to keep your pee clear or pale yellow. If you were told to drink an ORS, finish the  ORS first, then start slowly drinking clear fluids. Drink fluids such as:  Water. Do not drink only water by itself. Doing that can make the salt (sodium) level in your body get too low (hyponatremia).  Ice chips.  Fruit juice that you have added water to (diluted).  Low-calorie sports drinks.  Avoid:  Alcohol.  Drinks that have a lot of sugar. These include high-calorie sports drinks, fruit juice that does not have water added, and soda.  Caffeine.  Foods that are greasy or have a lot of fat or sugar.  Take over-the-counter and prescription medicines only as told by your doctor.  Do not take salt tablets. Doing that can make the salt level in your body get too high (hypernatremia).  Eat foods that have minerals (electrolytes). Examples include bananas, oranges, potatoes, tomatoes, and spinach.  Keep all follow-up visits as told by your doctor. This is important. Contact a doctor if:  You have belly (abdominal) pain that:  Gets worse.  Stays in one area (localizes).  You have a rash.  You have a stiff neck.  You get angry or annoyed more easily than normal (irritability).  You are more sleepy than normal.  You have a harder time waking up than normal.  You feel:  Weak.  Dizzy.  Very thirsty.  You have peed (urinated) only a small amount of very dark pee during 6-8 hours. Get help right away if:  You   have symptoms of very bad dehydration.  You cannot drink fluids without throwing up (vomiting).  Your symptoms get worse with treatment.  You have a fever.  You have a very bad headache.  You are throwing up or having watery poop (diarrhea) and it:  Gets worse.  Does not go away.  You have blood or something green (bile) in your throw-up.  You have blood in your poop (stool). This may cause poop to look black and tarry.  You have not peed in 6-8 hours.  You pass out (faint).  Your heart rate when you are sitting still is more than 100 beats a  minute.  You have trouble breathing. This information is not intended to replace advice given to you by your health care provider. Make sure you discuss any questions you have with your health care provider. Document Released: 02/22/2009 Document Revised: 11/16/2015 Document Reviewed: 06/22/2015 Elsevier Interactive Patient Education  2017 Elsevier Inc.  

## 2016-06-02 ENCOUNTER — Encounter: Payer: Self-pay | Admitting: Nurse Practitioner

## 2016-06-02 NOTE — Progress Notes (Signed)
RN visit only. 

## 2016-06-03 ENCOUNTER — Ambulatory Visit (HOSPITAL_BASED_OUTPATIENT_CLINIC_OR_DEPARTMENT_OTHER): Payer: BLUE CROSS/BLUE SHIELD | Admitting: Nurse Practitioner

## 2016-06-03 ENCOUNTER — Ambulatory Visit
Admission: RE | Admit: 2016-06-03 | Discharge: 2016-06-03 | Disposition: A | Discharge status: Home / Self Care | Payer: BLUE CROSS/BLUE SHIELD | Source: Ambulatory Visit | Attending: Radiation Oncology | Admitting: Radiation Oncology

## 2016-06-03 ENCOUNTER — Ambulatory Visit: Payer: BLUE CROSS/BLUE SHIELD | Attending: Hematology and Oncology

## 2016-06-03 ENCOUNTER — Encounter: Payer: Self-pay | Admitting: *Deleted

## 2016-06-03 ENCOUNTER — Encounter: Payer: Self-pay | Admitting: Nurse Practitioner

## 2016-06-03 ENCOUNTER — Ambulatory Visit: Payer: BLUE CROSS/BLUE SHIELD | Admitting: Nutrition

## 2016-06-03 VITALS — BP 133/75 | HR 75 | Temp 98.5°F | Ht 70.0 in | Wt 156.4 lb

## 2016-06-03 VITALS — BP 125/72 | HR 65 | Temp 98.0°F | Resp 17 | Ht 70.0 in | Wt 157.9 lb

## 2016-06-03 DIAGNOSIS — C099 Malignant neoplasm of tonsil, unspecified: Secondary | ICD-10-CM

## 2016-06-03 DIAGNOSIS — E860 Dehydration: Secondary | ICD-10-CM | POA: Diagnosis not present

## 2016-06-03 DIAGNOSIS — R1310 Dysphagia, unspecified: Secondary | ICD-10-CM | POA: Diagnosis not present

## 2016-06-03 DIAGNOSIS — C090 Malignant neoplasm of tonsillar fossa: Secondary | ICD-10-CM

## 2016-06-03 MED ORDER — ONDANSETRON HCL 4 MG/2ML IJ SOLN
8.0000 mg | Freq: Once | INTRAMUSCULAR | Status: AC
  Administered 2016-06-03: 8 mg via INTRAVENOUS

## 2016-06-03 MED ORDER — SODIUM CHLORIDE 0.9 % IV SOLN
Freq: Once | INTRAVENOUS | Status: AC
  Administered 2016-06-03: 10:00:00 via INTRAVENOUS

## 2016-06-03 MED ORDER — ONDANSETRON HCL 4 MG/2ML IJ SOLN
INTRAMUSCULAR | Status: AC
  Filled 2016-06-03: qty 4

## 2016-06-03 MED ORDER — SODIUM CHLORIDE 0.9% FLUSH
10.0000 mL | INTRAVENOUS | Status: DC | PRN
  Administered 2016-06-03: 10 mL
  Filled 2016-06-03: qty 10

## 2016-06-03 MED ORDER — SODIUM CHLORIDE 0.9 % IV SOLN: Freq: Once | INTRAVENOUS | Status: DC

## 2016-06-03 MED ORDER — HEPARIN SOD (PORK) LOCK FLUSH 100 UNIT/ML IV SOLN
500.0000 [IU] | Freq: Once | INTRAVENOUS | Status: AC | PRN
  Administered 2016-06-03: 500 [IU]
  Filled 2016-06-03: qty 5

## 2016-06-03 MED FILL — METOCLOPRAMIDE 5 MG/5 ML SY: 5 | 6 days supply | Qty: 240 | Fill #1

## 2016-06-03 NOTE — Progress Notes (Signed)
Brief nutrition follow up completed in Head and neck clinic. Patient is tolerating increase in TF to 7 cans daily to provide 2485 cal, 104.3 gm protein, and 1987 mL free water. Patient states he is feeling very full after continuous TF but denies intolerance. No N/V or constipation. Weight documented as 156 pounds. Patient is receiving IVF.  Nutrition Diagnosis: Inadequate oral intake and severe malnutrition both continue  Intervention: Continue increase TF to provide additional calories and protein. Questions answered and teach back method used.  Monitoring, Evaluation, Goals: Patient will tolerate TF to meet greater than 90% estimated nutrition needs for healing and weight maintenance  Next Visit: Tuesday, Jan 30, during IVF.

## 2016-06-03 NOTE — Progress Notes (Signed)
RN visit only. 

## 2016-06-03 NOTE — Patient Instructions (Signed)
Use ice chips throughout the day. When you get tired, do a few more then stop and come back to it 60-90 minutes later.  Increased fluids may thin your mucous.  Complete ALL the exercises at LEAST twice a day - three times if you can.  You may notice some difference in your swallowing (stronger) in about 4 weeks, and a greater chance of this as you approach 8 weeks of full completion of exercises.

## 2016-06-03 NOTE — Therapy (Signed)
[facility] [address] [place], [place], [address] Phone: [phone]   Fax:  [phone]  Speech Language Pathology Treatment  Patient Details  Name: Todd [name] MRN: [id number] Date of Birth: Jul 04, 1960 Referring Provider: Eppie Gibson MD  Encounter Date: 06/03/2016      End of Session - 06/03/16 1116    Visit Number 2   Number of Visits 6   Date for SLP Re-Evaluation 10/08/16   SLP Start Time [id number]   SLP Stop Time  0915   SLP Time Calculation (min) 43 min   Activity Tolerance Patient limited by lethargy      Past Medical History:  Diagnosis Date  . Arthritis   . Cancer ([place])    Tonsil Cancer  . History of radiation therapy 03/24/2016- 05/15/2016   Left Tonsil and Bilateral Neck  . Hyperlipidemia   . Hypertension   . Hypothyroidism     Past Surgical History:  Procedure Laterality Date  . IR GENERIC HISTORICAL  03/20/2016   IR US GUIDE VASC ACCESS RIGHT 03/20/2016 Sandi Mariscal, MD [facility]INTER[facility]  . IR GENERIC HISTORICAL  03/20/2016   IR FLUORO GUIDE PORT INSERTION RIGHT 03/20/2016 Sandi Mariscal, MD [facility]  . IR GENERIC HISTORICAL  03/20/2016   IR GASTROSTOMY TUBE MOD SED 03/20/2016 Sandi Mariscal, MD [facility]    There were no vitals filed for this visit.      Subjective Assessment - 06/03/16 0833    Subjective "ITs not saliva, it's thick yellow greenish - - mucous... when that's cleared out things can get down."               ADULT SLP TREATMENT - 06/03/16 0835      General Information   Behavior/Cognition Alert;Cooperative;Pleasant mood     Treatment Provided   Treatment provided Dysphagia     Dysphagia Treatment   Temperature Spikes Noted No   Respiratory Status Room air   Oral Cavity - Dentition Adequate natural dentition   Treatment Methods Skilled observation;Therapeutic exercise;Patient/caregiver education   Patient observed directly with PO's Yes   Type of PO's observed Thin  liquids   Liquids provided via Cup   Oral Phase Signs & Symptoms --  none noted   Pharyngeal Phase Signs & Symptoms Audible swallow  cough/regurgitation after 4th consecutive sip H2O   Other treatment/comments Pt has completed effortful swallow every day, other exercises piecemeal during the week. Pt with cough and regurgitation of H2O after fourth consecutive sip H2O. States that "it's random" how many sips pt can take before cough, regurgitation, and/or gagging occur after he clears mucous from pharyngeal cavity. Primary nutrition/hydration via PEG. SLP reiterated to pt need to complete ALL exercises, at least TWICE per day, and educated pt on how to incr difficulty of exercises - as with any muscle strengthening program. SLP suggested to pt to use ice chips multiple times throughout the day to incr number of swallow reps in order to strengthen swallow musculature. Told pt to likely expect at least 4 weeks of HEP at incr'd frequency/repts/variety of exercises prior to initial improvement in mucous clearing via swallowing (instead of expectorating), and less frequent coughing with water sips. Pt voiced understanding of this.     Pain Assessment   Pain Assessment No/denies pain     Assessment / Recommendations / Plan   Plan Continue with current plan of care     Dysphagia Recommendations   Diet recommendations Thin liquid;Other(comment)  ice chips  Liquids provided via Cup   Medication Administration Via alternative means   Compensations Effortful swallow     Progression Toward Goals   Progression toward goals Not progressing toward goals (comment)  due to decr'd compliance with HEP          SLP Education - 06/03/16 1115    Education provided Yes   Education Details HEP, rationale for HEP, need to comply with HEP recommended frequency/reps/variety of exercises   Person(s) Educated Patient   Methods Explanation;Demonstration;Verbal cues;Handout   Comprehension Verbalized  understanding;Returned demonstration;Verbal cues required          SLP Short Term Goals - 06/03/16 1124      SLP SHORT TERM GOAL #1   Title pt will complete HEP with rare min A   Time 2   Period --  visits   Status On-going     SLP SHORT TERM GOAL #2   Title pt will demo knowledge of 3 overt s/s aspiration PNA with modified indpendnece   Time 2   Period --  visits   Status On-going     SLP SHORT TERM GOAL #3   Title pt will tell SLP how a food journal can assist in return to more normal PO diet   Time 2   Period --  visits   Status On-going          SLP Long Term Goals - 06/03/16 [place] #1   Title pt will complete HEP with modified indpendence over 2 therapy visits   Time 4   Period --  visits   Status On-going     SLP LONG TERM GOAL #2   Title pt will tell SLP when to switch frequency of HEP from 2-3/day to x2/week   Time 4   Period --  visits   Status On-going          Plan - 06/03/16 1117    Clinical Impression Statement Pt did not return scheduler's request for pt to call and schedule f/u ST on 04-23-16. Pt does not report overt s/s aspiration PNA, nor any observed today. Pt seen today in multi-d clinic. SLP suspects some 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 This weakness makes it challenging to clear mucous from pharyngeal cavity which then may further complicate swallow function/pharyngeal clearance of bolus. SLP re-educated pt on rationale for HEP as well as need for pt to comply with SLP-reommended variety, frequency, and number reps for HEP, in order to experience improved swallow function. See "other treatment/comments" above for more details. Pt would cont to benefit from skilled ST on goals noted in this summary to improve swallow function and safety and mitigate danger of aspiration/aspiration PNA.   Speech Therapy Frequency --  once approx every 4 weeks   Duration --  4 visits    Treatment/Interventions Aspiration precaution training;Pharyngeal strengthening exercises;Compensatory techniques;SLP instruction and feedback;Patient/family education;Trials of upgraded texture/liquids  any and/or all may be used in ST sessions   Potential to Achieve Goals Good      Patient will benefit from skilled therapeutic intervention in order to improve the following deficits and impairments:   Dysphagia, unspecified type    Problem List Patient Active Problem List   Diagnosis Date Noted  . Pancytopenia, acquired ([place]) 05/26/2016  . Protein-calorie malnutrition, moderate ([place]) 05/26/2016  . Deficiency anemia 05/26/2016  . Postprandial abdominal bloating 05/16/2016  . Redness of both eyes 05/16/2016  .  Splenic lesion 05/16/2016  . Frequent epistaxis 04/28/2016  . Cough 04/21/2016  . Other constipation 04/21/2016  . Drug-induced neutropenia ([place]) 04/14/2016  . Anemia due to antineoplastic chemotherapy 04/14/2016  . Jaw pain 04/08/2016  . Chemotherapy-induced nausea 03/31/2016  . Bilateral tinnitus 03/31/2016  . Essential hypertension 03/18/2016  . Hemoptysis 02/26/2016  . Weight loss 02/26/2016  . Carcinoma of tonsillar fossa ([place]) 02/26/2016  . Tonsil cancer ([place]) 02/21/2016    [facility] ,[place], [place]  06/03/2016, 11:25 AM  [facility] [address] [place], [place], [address] Phone: [phone]   Fax:  [phone]   Name: Todd [name] MRN: [id number] Date of Birth: 1960-05-15

## 2016-06-03 NOTE — Patient Instructions (Signed)
Dehydration, Adult Dehydration is a condition in which there is not enough fluid or water in the body. This happens when you lose more fluids than you take in. Important organs, such as the kidneys, brain, and heart, cannot function without a proper amount of fluids. Any loss of fluids from the body can lead to dehydration. Dehydration can range from mild to severe. This condition should be treated right away to prevent it from becoming severe. What are the causes? This condition may be caused by:  Vomiting.  Diarrhea.  Excessive sweating, such as from heat exposure or exercise.  Not drinking enough fluid, especially:  When ill.  While doing activity that requires a lot of energy.  Excessive urination.  Fever.  Infection.  Certain medicines, such as medicines that cause the body to lose excess fluid (diuretics).  Inability to access safe drinking water.  Reduced physical ability to get adequate water and food. What increases the risk? This condition is more likely to develop in people:  Who have a poorly controlled long-term (chronic) illness, such as diabetes, heart disease, or kidney disease.  Who are age 65 or older.  Who are disabled.  Who live in a place with high altitude.  Who play endurance sports. What are the signs or symptoms? Symptoms of mild dehydration may include:   Thirst.  Dry lips.  Slightly dry mouth.  Dry, warm skin.  Dizziness. Symptoms of moderate dehydration may include:   Very dry mouth.  Muscle cramps.  Dark urine. Urine may be the color of tea.  Decreased urine production.  Decreased tear production.  Heartbeat that is irregular or faster than normal (palpitations).  Headache.  Light-headedness, especially when you stand up from a sitting position.  Fainting (syncope). Symptoms of severe dehydration may include:   Changes in skin, such as:  Cold and clammy skin.  Blotchy (mottled) or pale skin.  Skin that does  not quickly return to normal after being lightly pinched and released (poor skin turgor).  Changes in body fluids, such as:  Extreme thirst.  No tear production.  Inability to sweat when body temperature is high, such as in hot weather.  Very little urine production.  Changes in vital signs, such as:  Weak pulse.  Pulse that is more than 100 beats a minute when sitting still.  Rapid breathing.  Low blood pressure.  Other changes, such as:  Sunken eyes.  Cold hands and feet.  Confusion.  Lack of energy (lethargy).  Difficulty waking up from sleep.  Short-term weight loss.  Unconsciousness. How is this diagnosed? This condition is diagnosed based on your symptoms and a physical exam. Blood and urine tests may be done to help confirm the diagnosis. How is this treated? Treatment for this condition depends on the severity. Mild or moderate dehydration can often be treated at home. Treatment should be started right away. Do not wait until dehydration becomes severe. Severe dehydration is an emergency and it needs to be treated in a hospital. Treatment for mild dehydration may include:   Drinking more fluids.  Replacing salts and minerals in your blood (electrolytes) that you may have lost. Treatment for moderate dehydration may include:   Drinking an oral rehydration solution (ORS). This is a drink that helps you replace fluids and electrolytes (rehydrate). It can be found at pharmacies and retail stores. Treatment for severe dehydration may include:   Receiving fluids through an IV tube.  Receiving an electrolyte solution through a feeding tube that is   passed through your nose and into your stomach (nasogastric tube, or NG tube).  Correcting any abnormalities in electrolytes.  Treating the underlying cause of dehydration. Follow these instructions at home:  If directed by your health care provider, drink an ORS:  Make an ORS by following instructions on the  package.  Start by drinking small amounts, about  cup (120 mL) every 5-10 minutes.  Slowly increase how much you drink until you have taken the amount recommended by your health care provider.  Drink enough clear fluid to keep your urine clear or pale yellow. If you were told to drink an ORS, finish the ORS first, then start slowly drinking other clear fluids. Drink fluids such as:  Water. Do not drink only water. Doing that can lead to having too little salt (sodium) in the body (hyponatremia).  Ice chips.  Fruit juice that you have added water to (diluted fruit juice).  Low-calorie sports drinks.  Avoid:  Alcohol.  Drinks that contain a lot of sugar. These include high-calorie sports drinks, fruit juice that is not diluted, and soda.  Caffeine.  Foods that are greasy or contain a lot of fat or sugar.  Take over-the-counter and prescription medicines only as told by your health care provider.  Do not take sodium tablets. This can lead to having too much sodium in the body (hypernatremia).  Eat foods that contain a healthy balance of electrolytes, such as bananas, oranges, potatoes, tomatoes, and spinach.  Keep all follow-up visits as told by your health care provider. This is important. Contact a health care provider if:  You have abdominal pain that:  Gets worse.  Stays in one area (localizes).  You have a rash.  You have a stiff neck.  You are more irritable than usual.  You are sleepier or more difficult to wake up than usual.  You feel weak or dizzy.  You feel very thirsty.  You have urinated only a small amount of very dark urine over 6-8 hours. Get help right away if:  You have symptoms of severe dehydration.  You cannot drink fluids without vomiting.  Your symptoms get worse with treatment.  You have a fever.  You have a severe headache.  You have vomiting or diarrhea that:  Gets worse.  Does not go away.  You have blood or green matter  (bile) in your vomit.  You have blood in your stool. This may cause stool to look black and tarry.  You have not urinated in 6-8 hours.  You faint.  Your heart rate while sitting still is over 100 beats a minute.  You have trouble breathing. This information is not intended to replace advice given to you by your health care provider. Make sure you discuss any questions you have with your health care provider. Document Released: 04/28/2005 Document Revised: 11/23/2015 Document Reviewed: 06/22/2015 Elsevier Interactive Patient Education  2017 Elsevier Inc.  

## 2016-06-04 NOTE — Progress Notes (Signed)
Oncology Nurse Navigator Documentation  Met with Mr. Sansom during [facility] [place].  He was unaccompanied.    I provided verbal and written overview of MDC, the clinicians who will be seeing him.  He was seen by SLP after which he went to [facility] for IVF and where he was seen by Nutrition. He understands I can be contacted with needs/concerns.  Gayleen Orem, RN, BSN, [place] at [place] [phone]

## 2016-06-05 ENCOUNTER — Encounter: Payer: Self-pay | Admitting: Hematology and Oncology

## 2016-06-05 ENCOUNTER — Ambulatory Visit (HOSPITAL_BASED_OUTPATIENT_CLINIC_OR_DEPARTMENT_OTHER): Payer: BLUE CROSS/BLUE SHIELD

## 2016-06-05 ENCOUNTER — Encounter: Payer: Self-pay | Admitting: Nutrition

## 2016-06-05 VITALS — BP 130/84 | HR 68 | Temp 98.0°F | Resp 18

## 2016-06-05 DIAGNOSIS — C099 Malignant neoplasm of tonsil, unspecified: Secondary | ICD-10-CM | POA: Diagnosis not present

## 2016-06-05 DIAGNOSIS — E860 Dehydration: Secondary | ICD-10-CM

## 2016-06-05 MED ORDER — SODIUM CHLORIDE 0.9 % IV SOLN
1000.0000 mL | Freq: Once | INTRAVENOUS | Status: AC
  Administered 2016-06-05: 1000 mL via INTRAVENOUS

## 2016-06-05 MED ORDER — HEPARIN SOD (PORK) LOCK FLUSH 100 UNIT/ML IV SOLN
500.0000 [IU] | Freq: Once | INTRAVENOUS | Status: AC | PRN
  Administered 2016-06-05: 500 [IU]
  Filled 2016-06-05: qty 5

## 2016-06-05 MED ORDER — SODIUM CHLORIDE 0.9% FLUSH
10.0000 mL | INTRAVENOUS | Status: DC | PRN
  Administered 2016-06-05: 10 mL
  Filled 2016-06-05: qty 10

## 2016-06-05 NOTE — Progress Notes (Unsigned)
Faxed disability paperwork to [employer] attn: Janalee Dane at fax number [phone]

## 2016-06-05 NOTE — Patient Instructions (Signed)
Dehydration, Adult Dehydration is a condition in which there is not enough fluid or water in the body. This happens when you lose more fluids than you take in. Important organs, such as the kidneys, brain, and heart, cannot function without a proper amount of fluids. Any loss of fluids from the body can lead to dehydration. Dehydration can range from mild to severe. This condition should be treated right away to prevent it from becoming severe. What are the causes? This condition may be caused by:  Vomiting.  Diarrhea.  Excessive sweating, such as from heat exposure or exercise.  Not drinking enough fluid, especially:  When ill.  While doing activity that requires a lot of energy.  Excessive urination.  Fever.  Infection.  Certain medicines, such as medicines that cause the body to lose excess fluid (diuretics).  Inability to access safe drinking water.  Reduced physical ability to get adequate water and food. What increases the risk? This condition is more likely to develop in people:  Who have a poorly controlled long-term (chronic) illness, such as diabetes, heart disease, or kidney disease.  Who are age 65 or older.  Who are disabled.  Who live in a place with high altitude.  Who play endurance sports. What are the signs or symptoms? Symptoms of mild dehydration may include:   Thirst.  Dry lips.  Slightly dry mouth.  Dry, warm skin.  Dizziness. Symptoms of moderate dehydration may include:   Very dry mouth.  Muscle cramps.  Dark urine. Urine may be the color of tea.  Decreased urine production.  Decreased tear production.  Heartbeat that is irregular or faster than normal (palpitations).  Headache.  Light-headedness, especially when you stand up from a sitting position.  Fainting (syncope). Symptoms of severe dehydration may include:   Changes in skin, such as:  Cold and clammy skin.  Blotchy (mottled) or pale skin.  Skin that does  not quickly return to normal after being lightly pinched and released (poor skin turgor).  Changes in body fluids, such as:  Extreme thirst.  No tear production.  Inability to sweat when body temperature is high, such as in hot weather.  Very little urine production.  Changes in vital signs, such as:  Weak pulse.  Pulse that is more than 100 beats a minute when sitting still.  Rapid breathing.  Low blood pressure.  Other changes, such as:  Sunken eyes.  Cold hands and feet.  Confusion.  Lack of energy (lethargy).  Difficulty waking up from sleep.  Short-term weight loss.  Unconsciousness. How is this diagnosed? This condition is diagnosed based on your symptoms and a physical exam. Blood and urine tests may be done to help confirm the diagnosis. How is this treated? Treatment for this condition depends on the severity. Mild or moderate dehydration can often be treated at home. Treatment should be started right away. Do not wait until dehydration becomes severe. Severe dehydration is an emergency and it needs to be treated in a hospital. Treatment for mild dehydration may include:   Drinking more fluids.  Replacing salts and minerals in your blood (electrolytes) that you may have lost. Treatment for moderate dehydration may include:   Drinking an oral rehydration solution (ORS). This is a drink that helps you replace fluids and electrolytes (rehydrate). It can be found at pharmacies and retail stores. Treatment for severe dehydration may include:   Receiving fluids through an IV tube.  Receiving an electrolyte solution through a feeding tube that is   passed through your nose and into your stomach (nasogastric tube, or NG tube).  Correcting any abnormalities in electrolytes.  Treating the underlying cause of dehydration. Follow these instructions at home:  If directed by your health care provider, drink an ORS:  Make an ORS by following instructions on the  package.  Start by drinking small amounts, about  cup (120 mL) every 5-10 minutes.  Slowly increase how much you drink until you have taken the amount recommended by your health care provider.  Drink enough clear fluid to keep your urine clear or pale yellow. If you were told to drink an ORS, finish the ORS first, then start slowly drinking other clear fluids. Drink fluids such as:  Water. Do not drink only water. Doing that can lead to having too little salt (sodium) in the body (hyponatremia).  Ice chips.  Fruit juice that you have added water to (diluted fruit juice).  Low-calorie sports drinks.  Avoid:  Alcohol.  Drinks that contain a lot of sugar. These include high-calorie sports drinks, fruit juice that is not diluted, and soda.  Caffeine.  Foods that are greasy or contain a lot of fat or sugar.  Take over-the-counter and prescription medicines only as told by your health care provider.  Do not take sodium tablets. This can lead to having too much sodium in the body (hypernatremia).  Eat foods that contain a healthy balance of electrolytes, such as bananas, oranges, potatoes, tomatoes, and spinach.  Keep all follow-up visits as told by your health care provider. This is important. Contact a health care provider if:  You have abdominal pain that:  Gets worse.  Stays in one area (localizes).  You have a rash.  You have a stiff neck.  You are more irritable than usual.  You are sleepier or more difficult to wake up than usual.  You feel weak or dizzy.  You feel very thirsty.  You have urinated only a small amount of very dark urine over 6-8 hours. Get help right away if:  You have symptoms of severe dehydration.  You cannot drink fluids without vomiting.  Your symptoms get worse with treatment.  You have a fever.  You have a severe headache.  You have vomiting or diarrhea that:  Gets worse.  Does not go away.  You have blood or green matter  (bile) in your vomit.  You have blood in your stool. This may cause stool to look black and tarry.  You have not urinated in 6-8 hours.  You faint.  Your heart rate while sitting still is over 100 beats a minute.  You have trouble breathing. This information is not intended to replace advice given to you by your health care provider. Make sure you discuss any questions you have with your health care provider. Document Released: 04/28/2005 Document Revised: 11/23/2015 Document Reviewed: 06/22/2015 Elsevier Interactive Patient Education  2017 Elsevier Inc.  

## 2016-06-07 ENCOUNTER — Ambulatory Visit (HOSPITAL_BASED_OUTPATIENT_CLINIC_OR_DEPARTMENT_OTHER): Payer: BLUE CROSS/BLUE SHIELD

## 2016-06-07 VITALS — BP 129/84 | HR 65 | Temp 98.4°F | Resp 18

## 2016-06-07 DIAGNOSIS — C801 Malignant (primary) neoplasm, unspecified: Secondary | ICD-10-CM | POA: Diagnosis not present

## 2016-06-07 DIAGNOSIS — C099 Malignant neoplasm of tonsil, unspecified: Secondary | ICD-10-CM | POA: Diagnosis not present

## 2016-06-07 DIAGNOSIS — C029 Malignant neoplasm of tongue, unspecified: Secondary | ICD-10-CM | POA: Diagnosis not present

## 2016-06-07 MED ORDER — ONDANSETRON HCL 40 MG/20ML IJ SOLN
Freq: Once | INTRAMUSCULAR | Status: AC
  Administered 2016-06-07: 08:00:00 via INTRAVENOUS

## 2016-06-07 MED ORDER — SODIUM CHLORIDE 0.9 % IV SOLN
Freq: Once | INTRAVENOUS | Status: AC
  Administered 2016-06-07: 08:00:00 via INTRAVENOUS

## 2016-06-07 MED ORDER — SODIUM CHLORIDE 0.9% FLUSH
10.0000 mL | Freq: Once | INTRAVENOUS | Status: AC
  Administered 2016-06-07: 10 mL
  Filled 2016-06-07: qty 10

## 2016-06-07 MED ORDER — HEPARIN SOD (PORK) LOCK FLUSH 100 UNIT/ML IV SOLN
250.0000 [IU] | Freq: Once | INTRAVENOUS | Status: AC
Start: 2016-06-07 — End: 2016-06-07
  Administered 2016-06-07: 10:00:00
  Filled 2016-06-07: qty 5

## 2016-06-07 MED ORDER — ONDANSETRON HCL 4 MG/2ML IJ SOLN
INTRAMUSCULAR | Status: AC
  Filled 2016-06-07: qty 4

## 2016-06-07 NOTE — Patient Instructions (Signed)
Dehydration, Adult Dehydration is a condition in which there is not enough fluid or water in the body. This happens when you lose more fluids than you take in. Important organs, such as the kidneys, brain, and heart, cannot function without a proper amount of fluids. Any loss of fluids from the body can lead to dehydration. Dehydration can range from mild to severe. This condition should be treated right away to prevent it from becoming severe. What are the causes? This condition may be caused by:  Vomiting.  Diarrhea.  Excessive sweating, such as from heat exposure or exercise.  Not drinking enough fluid, especially:  When ill.  While doing activity that requires a lot of energy.  Excessive urination.  Fever.  Infection.  Certain medicines, such as medicines that cause the body to lose excess fluid (diuretics).  Inability to access safe drinking water.  Reduced physical ability to get adequate water and food. What increases the risk? This condition is more likely to develop in people:  Who have a poorly controlled long-term (chronic) illness, such as diabetes, heart disease, or kidney disease.  Who are age 65 or older.  Who are disabled.  Who live in a place with high altitude.  Who play endurance sports. What are the signs or symptoms? Symptoms of mild dehydration may include:   Thirst.  Dry lips.  Slightly dry mouth.  Dry, warm skin.  Dizziness. Symptoms of moderate dehydration may include:   Very dry mouth.  Muscle cramps.  Dark urine. Urine may be the color of tea.  Decreased urine production.  Decreased tear production.  Heartbeat that is irregular or faster than normal (palpitations).  Headache.  Light-headedness, especially when you stand up from a sitting position.  Fainting (syncope). Symptoms of severe dehydration may include:   Changes in skin, such as:  Cold and clammy skin.  Blotchy (mottled) or pale skin.  Skin that does  not quickly return to normal after being lightly pinched and released (poor skin turgor).  Changes in body fluids, such as:  Extreme thirst.  No tear production.  Inability to sweat when body temperature is high, such as in hot weather.  Very little urine production.  Changes in vital signs, such as:  Weak pulse.  Pulse that is more than 100 beats a minute when sitting still.  Rapid breathing.  Low blood pressure.  Other changes, such as:  Sunken eyes.  Cold hands and feet.  Confusion.  Lack of energy (lethargy).  Difficulty waking up from sleep.  Short-term weight loss.  Unconsciousness. How is this diagnosed? This condition is diagnosed based on your symptoms and a physical exam. Blood and urine tests may be done to help confirm the diagnosis. How is this treated? Treatment for this condition depends on the severity. Mild or moderate dehydration can often be treated at home. Treatment should be started right away. Do not wait until dehydration becomes severe. Severe dehydration is an emergency and it needs to be treated in a hospital. Treatment for mild dehydration may include:   Drinking more fluids.  Replacing salts and minerals in your blood (electrolytes) that you may have lost. Treatment for moderate dehydration may include:   Drinking an oral rehydration solution (ORS). This is a drink that helps you replace fluids and electrolytes (rehydrate). It can be found at pharmacies and retail stores. Treatment for severe dehydration may include:   Receiving fluids through an IV tube.  Receiving an electrolyte solution through a feeding tube that is   passed through your nose and into your stomach (nasogastric tube, or NG tube).  Correcting any abnormalities in electrolytes.  Treating the underlying cause of dehydration. Follow these instructions at home:  If directed by your health care provider, drink an ORS:  Make an ORS by following instructions on the  package.  Start by drinking small amounts, about  cup (120 mL) every 5-10 minutes.  Slowly increase how much you drink until you have taken the amount recommended by your health care provider.  Drink enough clear fluid to keep your urine clear or pale yellow. If you were told to drink an ORS, finish the ORS first, then start slowly drinking other clear fluids. Drink fluids such as:  Water. Do not drink only water. Doing that can lead to having too little salt (sodium) in the body (hyponatremia).  Ice chips.  Fruit juice that you have added water to (diluted fruit juice).  Low-calorie sports drinks.  Avoid:  Alcohol.  Drinks that contain a lot of sugar. These include high-calorie sports drinks, fruit juice that is not diluted, and soda.  Caffeine.  Foods that are greasy or contain a lot of fat or sugar.  Take over-the-counter and prescription medicines only as told by your health care provider.  Do not take sodium tablets. This can lead to having too much sodium in the body (hypernatremia).  Eat foods that contain a healthy balance of electrolytes, such as bananas, oranges, potatoes, tomatoes, and spinach.  Keep all follow-up visits as told by your health care provider. This is important. Contact a health care provider if:  You have abdominal pain that:  Gets worse.  Stays in one area (localizes).  You have a rash.  You have a stiff neck.  You are more irritable than usual.  You are sleepier or more difficult to wake up than usual.  You feel weak or dizzy.  You feel very thirsty.  You have urinated only a small amount of very dark urine over 6-8 hours. Get help right away if:  You have symptoms of severe dehydration.  You cannot drink fluids without vomiting.  Your symptoms get worse with treatment.  You have a fever.  You have a severe headache.  You have vomiting or diarrhea that:  Gets worse.  Does not go away.  You have blood or green matter  (bile) in your vomit.  You have blood in your stool. This may cause stool to look black and tarry.  You have not urinated in 6-8 hours.  You faint.  Your heart rate while sitting still is over 100 beats a minute.  You have trouble breathing. This information is not intended to replace advice given to you by your health care provider. Make sure you discuss any questions you have with your health care provider. Document Released: 04/28/2005 Document Revised: 11/23/2015 Document Reviewed: 06/22/2015 Elsevier Interactive Patient Education  2017 Elsevier Inc.  

## 2016-06-09 MED FILL — METOCLOPRAMIDE 5 MG/5 ML SY: 5 | 6 days supply | Qty: 240 | Fill #2

## 2016-06-10 ENCOUNTER — Ambulatory Visit (HOSPITAL_BASED_OUTPATIENT_CLINIC_OR_DEPARTMENT_OTHER): Payer: BLUE CROSS/BLUE SHIELD

## 2016-06-10 ENCOUNTER — Ambulatory Visit: Payer: BLUE CROSS/BLUE SHIELD

## 2016-06-10 ENCOUNTER — Other Ambulatory Visit (HOSPITAL_BASED_OUTPATIENT_CLINIC_OR_DEPARTMENT_OTHER): Payer: BLUE CROSS/BLUE SHIELD

## 2016-06-10 VITALS — BP 138/80 | HR 65 | Temp 98.4°F | Resp 18

## 2016-06-10 DIAGNOSIS — C099 Malignant neoplasm of tonsil, unspecified: Secondary | ICD-10-CM

## 2016-06-10 DIAGNOSIS — E860 Dehydration: Secondary | ICD-10-CM | POA: Diagnosis not present

## 2016-06-10 DIAGNOSIS — D539 Nutritional anemia, unspecified: Secondary | ICD-10-CM

## 2016-06-10 LAB — CBC WITH DIFFERENTIAL/PLATELET
BASO%: 0.6 % (ref 0.0–2.0)
BASOS ABS: 0 10*3/uL (ref 0.0–0.1)
EOS%: 0.3 % (ref 0.0–7.0)
Eosinophils Absolute: 0 10*3/uL (ref 0.0–0.5)
HCT: 29.5 % — ABNORMAL LOW (ref 38.4–49.9)
HEMOGLOBIN: 9.9 g/dL — AB (ref 13.0–17.1)
LYMPH%: 9.5 % — AB (ref 14.0–49.0)
MCH: 29.5 pg (ref 27.2–33.4)
MCHC: 33.7 g/dL (ref 32.0–36.0)
MCV: 87.4 fL (ref 79.3–98.0)
MONO#: 0.8 10*3/uL (ref 0.1–0.9)
MONO%: 18.4 % — ABNORMAL HIGH (ref 0.0–14.0)
NEUT#: 3 10*3/uL (ref 1.5–6.5)
NEUT%: 71.2 % (ref 39.0–75.0)
Platelets: 380 10*3/uL (ref 140–400)
RBC: 3.37 10*6/uL — AB (ref 4.20–5.82)
RDW: 16.2 % — AB (ref 11.0–14.6)
WBC: 4.2 10*3/uL (ref 4.0–10.3)
lymph#: 0.4 10*3/uL — ABNORMAL LOW (ref 0.9–3.3)

## 2016-06-10 LAB — COMPREHENSIVE METABOLIC PANEL
ALT: 16 U/L (ref 0–55)
ANION GAP: 9 meq/L (ref 3–11)
AST: 13 U/L (ref 5–34)
Albumin: 3.6 g/dL (ref 3.5–5.0)
Alkaline Phosphatase: 49 U/L (ref 40–150)
BUN: 25.1 mg/dL (ref 7.0–26.0)
CALCIUM: 10 mg/dL (ref 8.4–10.4)
CO2: 30 meq/L — AB (ref 22–29)
CREATININE: 0.9 mg/dL (ref 0.7–1.3)
Chloride: 102 mEq/L (ref 98–109)
EGFR: >90 mL/min/{1.73_m2} (ref >90)
Glucose: 80 mg/dl (ref 70–140)
Potassium: 4.2 mEq/L (ref 3.5–5.1)
Sodium: 141 mEq/L (ref 136–145)
Total Bilirubin: 0.32 mg/dL (ref 0.20–1.20)
Total Protein: 6.8 g/dL (ref 6.4–8.3)

## 2016-06-10 LAB — MAGNESIUM: Magnesium: 2 mg/dl (ref 1.5–2.5)

## 2016-06-10 MED ORDER — SODIUM CHLORIDE 0.9% FLUSH
10.0000 mL | INTRAVENOUS | Status: DC | PRN
  Administered 2016-06-10: 10 mL
  Filled 2016-06-10: qty 10

## 2016-06-10 MED ORDER — SODIUM CHLORIDE 0.9 % IV SOLN
Freq: Once | INTRAVENOUS | Status: AC
  Administered 2016-06-10: 13:00:00 via INTRAVENOUS

## 2016-06-10 MED ORDER — HEPARIN SOD (PORK) LOCK FLUSH 100 UNIT/ML IV SOLN
500.0000 [IU] | Freq: Once | INTRAVENOUS | Status: AC | PRN
  Administered 2016-06-10: 500 [IU]
  Filled 2016-06-10: qty 5

## 2016-06-10 NOTE — Progress Notes (Signed)
Nutrition Follow-up:  Nutrition follow-up completed during infusion.   Patient reports he is tolerating 7 cans of osmolite 1.5.  Reports that he using continuous pump at night set at 156ml/hr for 12 hours and gives additional 2 cans of osmolite 1.5 during the day.  Reports nausea and vomiting last week but thinks it is related to zofran. Reports he is not taking zofran today. Reports that he is drinking about 2 16 oz bottle of water per day and flushing tube with water as prescribed.  Patient planning on receiving IV fluids on Saturday.  Reports normal bowel movement at least 1 or 2 per day.   Medications: reviewed  Labs: reglan and compazine ordered  Anthropometrics:   No weight taken today but patient reports at home weighed 160 pounds this am.  Weight on last visit was 156 pound on 06/03/16   Estimated Energy Needs  2225-2425 calories, 95-115 grams protein, 2.3 L fluid  NUTRITION DIAGNOSIS: Inadequate oral intake continues   MALNUTRITION DIAGNOSIS: Severe malnutrition continues   INTERVENTION:   Continue with current tube feeding regimen to provide adequate calories and protein.  No questions at this time.      MONITORING, EVALUATION, GOAL: Patient will tolerate tube feeding to meet greater than 90% of estimated nutrition needs for healing and weight maintenance.     NEXT VISIT: as needed  Aaliyah Gavel B. Zenia Resides, [place], [place] (pager)

## 2016-06-10 NOTE — Patient Instructions (Signed)
Dehydration, Adult Dehydration is a condition in which there is not enough fluid or water in the body. This happens when you lose more fluids than you take in. Important organs, such as the kidneys, brain, and heart, cannot function without a proper amount of fluids. Any loss of fluids from the body can lead to dehydration. Dehydration can range from mild to severe. This condition should be treated right away to prevent it from becoming severe. What are the causes? This condition may be caused by:  Vomiting.  Diarrhea.  Excessive sweating, such as from heat exposure or exercise.  Not drinking enough fluid, especially:  When ill.  While doing activity that requires a lot of energy.  Excessive urination.  Fever.  Infection.  Certain medicines, such as medicines that cause the body to lose excess fluid (diuretics).  Inability to access safe drinking water.  Reduced physical ability to get adequate water and food. What increases the risk? This condition is more likely to develop in people:  Who have a poorly controlled long-term (chronic) illness, such as diabetes, heart disease, or kidney disease.  Who are age 65 or older.  Who are disabled.  Who live in a place with high altitude.  Who play endurance sports. What are the signs or symptoms? Symptoms of mild dehydration may include:   Thirst.  Dry lips.  Slightly dry mouth.  Dry, warm skin.  Dizziness. Symptoms of moderate dehydration may include:   Very dry mouth.  Muscle cramps.  Dark urine. Urine may be the color of tea.  Decreased urine production.  Decreased tear production.  Heartbeat that is irregular or faster than normal (palpitations).  Headache.  Light-headedness, especially when you stand up from a sitting position.  Fainting (syncope). Symptoms of severe dehydration may include:   Changes in skin, such as:  Cold and clammy skin.  Blotchy (mottled) or pale skin.  Skin that does  not quickly return to normal after being lightly pinched and released (poor skin turgor).  Changes in body fluids, such as:  Extreme thirst.  No tear production.  Inability to sweat when body temperature is high, such as in hot weather.  Very little urine production.  Changes in vital signs, such as:  Weak pulse.  Pulse that is more than 100 beats a minute when sitting still.  Rapid breathing.  Low blood pressure.  Other changes, such as:  Sunken eyes.  Cold hands and feet.  Confusion.  Lack of energy (lethargy).  Difficulty waking up from sleep.  Short-term weight loss.  Unconsciousness. How is this diagnosed? This condition is diagnosed based on your symptoms and a physical exam. Blood and urine tests may be done to help confirm the diagnosis. How is this treated? Treatment for this condition depends on the severity. Mild or moderate dehydration can often be treated at home. Treatment should be started right away. Do not wait until dehydration becomes severe. Severe dehydration is an emergency and it needs to be treated in a hospital. Treatment for mild dehydration may include:   Drinking more fluids.  Replacing salts and minerals in your blood (electrolytes) that you may have lost. Treatment for moderate dehydration may include:   Drinking an oral rehydration solution (ORS). This is a drink that helps you replace fluids and electrolytes (rehydrate). It can be found at pharmacies and retail stores. Treatment for severe dehydration may include:   Receiving fluids through an IV tube.  Receiving an electrolyte solution through a feeding tube that is   passed through your nose and into your stomach (nasogastric tube, or NG tube).  Correcting any abnormalities in electrolytes.  Treating the underlying cause of dehydration. Follow these instructions at home:  If directed by your health care provider, drink an ORS:  Make an ORS by following instructions on the  package.  Start by drinking small amounts, about  cup (120 mL) every 5-10 minutes.  Slowly increase how much you drink until you have taken the amount recommended by your health care provider.  Drink enough clear fluid to keep your urine clear or pale yellow. If you were told to drink an ORS, finish the ORS first, then start slowly drinking other clear fluids. Drink fluids such as:  Water. Do not drink only water. Doing that can lead to having too little salt (sodium) in the body (hyponatremia).  Ice chips.  Fruit juice that you have added water to (diluted fruit juice).  Low-calorie sports drinks.  Avoid:  Alcohol.  Drinks that contain a lot of sugar. These include high-calorie sports drinks, fruit juice that is not diluted, and soda.  Caffeine.  Foods that are greasy or contain a lot of fat or sugar.  Take over-the-counter and prescription medicines only as told by your health care provider.  Do not take sodium tablets. This can lead to having too much sodium in the body (hypernatremia).  Eat foods that contain a healthy balance of electrolytes, such as bananas, oranges, potatoes, tomatoes, and spinach.  Keep all follow-up visits as told by your health care provider. This is important. Contact a health care provider if:  You have abdominal pain that:  Gets worse.  Stays in one area (localizes).  You have a rash.  You have a stiff neck.  You are more irritable than usual.  You are sleepier or more difficult to wake up than usual.  You feel weak or dizzy.  You feel very thirsty.  You have urinated only a small amount of very dark urine over 6-8 hours. Get help right away if:  You have symptoms of severe dehydration.  You cannot drink fluids without vomiting.  Your symptoms get worse with treatment.  You have a fever.  You have a severe headache.  You have vomiting or diarrhea that:  Gets worse.  Does not go away.  You have blood or green matter  (bile) in your vomit.  You have blood in your stool. This may cause stool to look black and tarry.  You have not urinated in 6-8 hours.  You faint.  Your heart rate while sitting still is over 100 beats a minute.  You have trouble breathing. This information is not intended to replace advice given to you by your health care provider. Make sure you discuss any questions you have with your health care provider. Document Released: 04/28/2005 Document Revised: 11/23/2015 Document Reviewed: 06/22/2015 Elsevier Interactive Patient Education  2017 Elsevier Inc.  

## 2016-06-11 ENCOUNTER — Ambulatory Visit (HOSPITAL_COMMUNITY): Payer: Medicaid - Dental | Admitting: Dentistry

## 2016-06-11 ENCOUNTER — Encounter (HOSPITAL_COMMUNITY): Payer: Self-pay | Admitting: Dentistry

## 2016-06-11 VITALS — BP 144/82 | HR 71 | Temp 98.1°F | Wt 160.0 lb

## 2016-06-11 DIAGNOSIS — R682 Dry mouth, unspecified: Secondary | ICD-10-CM

## 2016-06-11 DIAGNOSIS — K117 Disturbances of salivary secretion: Secondary | ICD-10-CM | POA: Diagnosis not present

## 2016-06-11 DIAGNOSIS — Z923 Personal history of irradiation: Secondary | ICD-10-CM

## 2016-06-11 DIAGNOSIS — R432 Parageusia: Secondary | ICD-10-CM | POA: Diagnosis not present

## 2016-06-11 DIAGNOSIS — R1310 Dysphagia, unspecified: Secondary | ICD-10-CM

## 2016-06-11 DIAGNOSIS — R634 Abnormal weight loss: Secondary | ICD-10-CM | POA: Diagnosis not present

## 2016-06-11 DIAGNOSIS — Z9221 Personal history of antineoplastic chemotherapy: Secondary | ICD-10-CM

## 2016-06-11 DIAGNOSIS — C099 Malignant neoplasm of tonsil, unspecified: Secondary | ICD-10-CM

## 2016-06-11 LAB — VITAMIN B12

## 2016-06-11 NOTE — Progress Notes (Signed)
06/11/2016  Patient Name:   Todd [name] Date of Birth:   [birth year] Medical Record Number: [id number]  BP (!) 144/82 (BP Location: Left Arm)   Pulse 71   Temp 98.1 F (36.7 C) (Oral)   Wt 160 lb (72.6 kg)   BMI 22.96 kg/m   [name] presents for oral examination after chemoradiation therapy. Patient has completed all radiation treatments from 03/24/16 thru 05/15/16. Patient had 3 chemotherapy treatments.  REVIEW OF CHIEF COMPLAINTS:  DRY MOUTH: Yes HARD TO SWALLOW: Yes, at times.  HURT TO SWALLOW: Yes, at times. TASTE CHANGES: Taste is returning slowly. SORES IN MOUTH: None TRISMUS: No problems with trismus WEIGHT: 160 lbs. The patient had a 40 pound weight loss during treatment. Patient is still using feeding tube. HOME OH REGIMEN:  BRUSHING: Twice a day FLOSSING: No. Patient encouraged to floss daily. RINSING: Patient is using Biotene rinses.  FLUORIDE: The patient is starting to use fluoride at night again. TRISMUS EXERCISES:  Maximum interincisal opening: 40 mm   DENTAL EXAM:  Oral Hygiene:(PLAQUE): Good oral hygiene LOCATION OF MUCOSITIS: None noted DESCRIPTION OF SALIVA: Decreased saliva. Foamy and ropey saliva.  ANY EXPOSED BONE: None noted OTHER WATCHED AREAS: Previous extraction sites. DX: Xerostomia, Dysgeusia, Dysphagia, Odynophagia and Weight Loss  RECOMMENDATIONS: 1. Brush after meals and at bedtime.  Use fluoride at bedtime. 2. Use trismus exercises as directed. 3. Use Biotene Rinse or salt water/baking soda rinses. 4. Multiple sips of water as needed. 5.  Patient is to find a new primary dentist. Patient will then need to sign a release of information to forward records to the new dentist. Patient should be seen in 2-3 months for an initial examination radiographs and evaluation for other dental treatment needs at that time.    Lenn Cal, DDS

## 2016-06-11 NOTE — Patient Instructions (Signed)
RECOMMENDATIONS: 1. Brush after meals and at bedtime.  Use fluoride at bedtime. 2. Use trismus exercises as directed. 3. Use Biotene Rinse or salt water/baking soda rinses. 4. Multiple sips of water as needed. 5.  Patient is to find a new primary dentist. Patient will then need to sign a release of information to forward records to the new dentist. Patient should be seen in 2-3 months for an initial examination radiographs and evaluation for other dental treatment needs at that time.    Lenn Cal, DDS

## 2016-06-12 ENCOUNTER — Ambulatory Visit (HOSPITAL_BASED_OUTPATIENT_CLINIC_OR_DEPARTMENT_OTHER): Payer: BLUE CROSS/BLUE SHIELD | Admitting: Nurse Practitioner

## 2016-06-12 ENCOUNTER — Telehealth: Payer: Self-pay | Admitting: Hematology and Oncology

## 2016-06-12 ENCOUNTER — Ambulatory Visit (HOSPITAL_BASED_OUTPATIENT_CLINIC_OR_DEPARTMENT_OTHER): Payer: BLUE CROSS/BLUE SHIELD | Admitting: Hematology and Oncology

## 2016-06-12 DIAGNOSIS — T451X5A Adverse effect of antineoplastic and immunosuppressive drugs, initial encounter: Secondary | ICD-10-CM

## 2016-06-12 DIAGNOSIS — D6481 Anemia due to antineoplastic chemotherapy: Secondary | ICD-10-CM

## 2016-06-12 DIAGNOSIS — R1310 Dysphagia, unspecified: Secondary | ICD-10-CM

## 2016-06-12 DIAGNOSIS — C099 Malignant neoplasm of tonsil, unspecified: Secondary | ICD-10-CM

## 2016-06-12 DIAGNOSIS — C029 Malignant neoplasm of tongue, unspecified: Secondary | ICD-10-CM | POA: Diagnosis not present

## 2016-06-12 MED ORDER — SODIUM CHLORIDE 0.9% FLUSH
10.0000 mL | INTRAVENOUS | Status: DC | PRN
Start: 2016-06-12 — End: 2016-06-12
  Administered 2016-06-12: 10 mL
  Filled 2016-06-12: qty 10

## 2016-06-12 MED ORDER — SODIUM CHLORIDE 0.9 % IV SOLN
Freq: Once | INTRAVENOUS | Status: AC
  Administered 2016-06-12: 12:00:00 via INTRAVENOUS

## 2016-06-12 MED ORDER — HEPARIN SOD (PORK) LOCK FLUSH 100 UNIT/ML IV SOLN
500.0000 [IU] | Freq: Once | INTRAVENOUS | Status: AC | PRN
  Administered 2016-06-12: 500 [IU]
  Filled 2016-06-12: qty 5

## 2016-06-12 NOTE — Patient Instructions (Signed)
Dehydration, Adult Dehydration is a condition in which there is not enough fluid or water in the body. This happens when you lose more fluids than you take in. Important organs, such as the kidneys, brain, and heart, cannot function without a proper amount of fluids. Any loss of fluids from the body can lead to dehydration. Dehydration can range from mild to severe. This condition should be treated right away to prevent it from becoming severe. What are the causes? This condition may be caused by:  Vomiting.  Diarrhea.  Excessive sweating, such as from heat exposure or exercise.  Not drinking enough fluid, especially:  When ill.  While doing activity that requires a lot of energy.  Excessive urination.  Fever.  Infection.  Certain medicines, such as medicines that cause the body to lose excess fluid (diuretics).  Inability to access safe drinking water.  Reduced physical ability to get adequate water and food. What increases the risk? This condition is more likely to develop in people:  Who have a poorly controlled long-term (chronic) illness, such as diabetes, heart disease, or kidney disease.  Who are age 65 or older.  Who are disabled.  Who live in a place with high altitude.  Who play endurance sports. What are the signs or symptoms? Symptoms of mild dehydration may include:   Thirst.  Dry lips.  Slightly dry mouth.  Dry, warm skin.  Dizziness. Symptoms of moderate dehydration may include:   Very dry mouth.  Muscle cramps.  Dark urine. Urine may be the color of tea.  Decreased urine production.  Decreased tear production.  Heartbeat that is irregular or faster than normal (palpitations).  Headache.  Light-headedness, especially when you stand up from a sitting position.  Fainting (syncope). Symptoms of severe dehydration may include:   Changes in skin, such as:  Cold and clammy skin.  Blotchy (mottled) or pale skin.  Skin that does  not quickly return to normal after being lightly pinched and released (poor skin turgor).  Changes in body fluids, such as:  Extreme thirst.  No tear production.  Inability to sweat when body temperature is high, such as in hot weather.  Very little urine production.  Changes in vital signs, such as:  Weak pulse.  Pulse that is more than 100 beats a minute when sitting still.  Rapid breathing.  Low blood pressure.  Other changes, such as:  Sunken eyes.  Cold hands and feet.  Confusion.  Lack of energy (lethargy).  Difficulty waking up from sleep.  Short-term weight loss.  Unconsciousness. How is this diagnosed? This condition is diagnosed based on your symptoms and a physical exam. Blood and urine tests may be done to help confirm the diagnosis. How is this treated? Treatment for this condition depends on the severity. Mild or moderate dehydration can often be treated at home. Treatment should be started right away. Do not wait until dehydration becomes severe. Severe dehydration is an emergency and it needs to be treated in a hospital. Treatment for mild dehydration may include:   Drinking more fluids.  Replacing salts and minerals in your blood (electrolytes) that you may have lost. Treatment for moderate dehydration may include:   Drinking an oral rehydration solution (ORS). This is a drink that helps you replace fluids and electrolytes (rehydrate). It can be found at pharmacies and retail stores. Treatment for severe dehydration may include:   Receiving fluids through an IV tube.  Receiving an electrolyte solution through a feeding tube that is   passed through your nose and into your stomach (nasogastric tube, or NG tube).  Correcting any abnormalities in electrolytes.  Treating the underlying cause of dehydration. Follow these instructions at home:  If directed by your health care provider, drink an ORS:  Make an ORS by following instructions on the  package.  Start by drinking small amounts, about  cup (120 mL) every 5-10 minutes.  Slowly increase how much you drink until you have taken the amount recommended by your health care provider.  Drink enough clear fluid to keep your urine clear or pale yellow. If you were told to drink an ORS, finish the ORS first, then start slowly drinking other clear fluids. Drink fluids such as:  Water. Do not drink only water. Doing that can lead to having too little salt (sodium) in the body (hyponatremia).  Ice chips.  Fruit juice that you have added water to (diluted fruit juice).  Low-calorie sports drinks.  Avoid:  Alcohol.  Drinks that contain a lot of sugar. These include high-calorie sports drinks, fruit juice that is not diluted, and soda.  Caffeine.  Foods that are greasy or contain a lot of fat or sugar.  Take over-the-counter and prescription medicines only as told by your health care provider.  Do not take sodium tablets. This can lead to having too much sodium in the body (hypernatremia).  Eat foods that contain a healthy balance of electrolytes, such as bananas, oranges, potatoes, tomatoes, and spinach.  Keep all follow-up visits as told by your health care provider. This is important. Contact a health care provider if:  You have abdominal pain that:  Gets worse.  Stays in one area (localizes).  You have a rash.  You have a stiff neck.  You are more irritable than usual.  You are sleepier or more difficult to wake up than usual.  You feel weak or dizzy.  You feel very thirsty.  You have urinated only a small amount of very dark urine over 6-8 hours. Get help right away if:  You have symptoms of severe dehydration.  You cannot drink fluids without vomiting.  Your symptoms get worse with treatment.  You have a fever.  You have a severe headache.  You have vomiting or diarrhea that:  Gets worse.  Does not go away.  You have blood or green matter  (bile) in your vomit.  You have blood in your stool. This may cause stool to look black and tarry.  You have not urinated in 6-8 hours.  You faint.  Your heart rate while sitting still is over 100 beats a minute.  You have trouble breathing. This information is not intended to replace advice given to you by your health care provider. Make sure you discuss any questions you have with your health care provider. Document Released: 04/28/2005 Document Revised: 11/23/2015 Document Reviewed: 06/22/2015 Elsevier Interactive Patient Education  2017 Elsevier Inc.  

## 2016-06-12 NOTE — Telephone Encounter (Signed)
Gave patient avs report and appointments for February.  °

## 2016-06-13 ENCOUNTER — Encounter: Payer: Self-pay | Admitting: Radiation Oncology

## 2016-06-13 ENCOUNTER — Encounter: Payer: Self-pay | Admitting: Hematology and Oncology

## 2016-06-13 DIAGNOSIS — R1314 Dysphagia, pharyngoesophageal phase: Secondary | ICD-10-CM | POA: Insufficient documentation

## 2016-06-13 DIAGNOSIS — R1310 Dysphagia, unspecified: Secondary | ICD-10-CM | POA: Insufficient documentation

## 2016-06-13 NOTE — Progress Notes (Signed)
Paperwork ([email or web address]) faxed to [facility] @ [phone], conf received, copy mailed to patient

## 2016-06-13 NOTE — Assessment & Plan Note (Signed)
He complained of persistent dysphagia. It is mildly improving. He would appreciate speech and language therapist to teach him how to do swallow exercise accurately.

## 2016-06-13 NOTE — Assessment & Plan Note (Signed)
This is likely anemia of chronic disease. The patient denies recent history of bleeding such as epistaxis, hematuria or hematochezia. He is asymptomatic from the anemia. We will observe for now.  He does not require transfusion now.   

## 2016-06-13 NOTE — Progress Notes (Signed)
[facility] OFFICE PROGRESS NOTE  Patient Care Team: Haywood Pao, MD as PCP - General (Internal Medicine) Leota Sauers, RN as Oncology Nurse Navigator Eppie Gibson, MD as Attending Physician (Radiation Oncology) Heath Lark, MD as Consulting Physician (Hematology and Oncology) Karie Mainland, RD as Dietitian (Nutrition)  SUMMARY OF ONCOLOGIC HISTORY:   Tonsil cancer ([place])   02/07/2016 Imaging    Ct neck showed abnormal soft tissue within the left pharynx, extending from the left tonsillar pillar inferiorly to the level of the hyoid and filling the left vallecula. The appearance is most consistent with a neoplastic process. 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 Histologic sampling and direct visualization should be considered. Confluent adenopathy within the left cervical chain with areas of central hypoattenuation suggesting degrees of necrosis. This is most suggestive of metastatic spread. The areas of necrosis within the adenopathy combined with the above-described pharyngeal soft tissue mass are together suggestive of squamous cell carcinoma.      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 Borderline hypermetabolic small right station IIb lymph node. Several tiny lung nodules are observed, these are not hypermetabolic but are also below the threshold level for accurate characterization. Dedicated CT chest may be warranted to act as a baseline.      03/20/2016 Procedure    Placement of a right internal jugular approach power injectable Port-A-Cath. Fluoroscopic insertion of a 20-French pull-through gastrostomy tube.        03/24/2016 - 05/06/2016 Chemotherapy    He received high dose cisplatin; dose 2 and 3 with dose reduction due to side-effects      03/24/2016 - 05/15/2016 Radiation Therapy    He received concurrent radiation       INTERVAL HISTORY: Please see below for problem oriented charting. He returns for follow-up. He is doing very well He is encouraged that he is not in pain. He felt that his swallowing is improving but he forgot the exercise that was taught by the speech and language therapist. He denies recent choking. He has persistent dry mouth and altered taste sensation. The mucous secretion is less. He denies recent coughing. He is motivated to try to eat by mouth in an effort to wean himself off the feeding tube He felt that IV fluids is helping him by reducing the amount of liquid intake. He has gained weight  REVIEW OF SYSTEMS:   Constitutional: Denies fevers, chills or abnormal weight loss Eyes: Denies blurriness of vision Ears, nose, mouth, throat, and face: Denies mucositis or sore throat Respiratory: Denies cough, dyspnea or wheezes Cardiovascular: Denies palpitation, chest discomfort or lower extremity swelling Gastrointestinal:  Denies nausea, heartburn or change in bowel habits Skin: Denies abnormal skin rashes Lymphatics: Denies new lymphadenopathy or easy bruising Neurological:Denies numbness, tingling or new weaknesses Behavioral/Psych: Mood is stable, no new changes  All other systems were reviewed with the patient and are negative.  I have reviewed the past medical history, past surgical history, social history and family history with the patient and they are unchanged from previous note.  ALLERGIES:  is allergic to phenergan [promethazine hcl].  MEDICATIONS:  Current Outpatient Prescriptions  Medication Sig Dispense Refill  . fenofibrate (TRICOR) 145 MG tablet Take 145 mg by mouth daily.     [name] levothyroxine (SYNTHROID, LEVOTHROID) 50 MCG tablet Take 50 mcg by  mouth daily before breakfast.     . metoCLOPramide (REGLAN) 5 MG/5ML solution Take 10 mLs (10 mg total) by mouth 4 (four) times daily -  before meals and at bedtime. 240 mL 11  . Nutritional Supplements (FEEDING SUPPLEMENT, OSMOLITE 1.5 CAL,) LIQD Give 1 can Osmolite 1.5 Via PEG TID with 120 cc free water bolus at 0900, 1200, 1500. Begin osmolite 1.5 at 6 pm at 60 cc/hr and run for 12 hours continuously. Flush with 120 cc free water before and after continuous feeding. Send pump and supplies for continuous feeding. 1422 mL 0  . lactulose (CHRONULAC) 10 GM/15ML solution Place 15 mLs (10 g total) into feeding tube 3 (three) times daily. (Patient not taking: Reported on 05/30/2016) 240 mL 0  . lidocaine (XYLOCAINE) 2 % solution Patient: Mix 1part 2% viscous lidocaine, 1part H20. Swish/swallow 54mL of this mixture, 35min before meals and at bedtime, up to QID (Patient not taking: Reported on 05/30/2016) 200 mL 3  . lidocaine-prilocaine (EMLA) cream Apply to affected area once (Patient not taking: Reported on 06/12/2016) 30 g 3  . ondansetron (ZOFRAN) 8 MG tablet Take 1 tablet (8 mg total) by mouth 2 (two) times daily as needed. Start on the third day after chemotherapy. (Patient not taking: Reported on 05/30/2016) 30 tablet 1  . polyethylene glycol (MIRALAX) packet Take 17 g by mouth daily. (Patient not taking: Reported on 05/30/2016) 14 each 0  . prochlorperazine (COMPAZINE) 10 MG tablet Take 1 tablet (10 mg total) by mouth every 6 (six) hours as needed (Nausea or vomiting). (Patient not taking: Reported on 05/30/2016) 30 tablet 1  . sodium fluoride (FLUORISHIELD) 1.1 % GEL dental gel Instill one drop of gel per tooth space of fluoride tray. Place over teeth for 5 minutes. Remove. Spit out excess. Repeat nightly. (Patient not taking: Reported on 05/30/2016) 120 mL prn   No current facility-administered medications for this visit.     PHYSICAL EXAMINATION: ECOG PERFORMANCE STATUS: 0 - Asymptomatic  Vitals:    06/12/16 1118  BP: 128/77  Pulse: 72  Resp: 18  Temp: 98.2 F (36.8 C)   Filed Weights   06/12/16 1118  Weight: 162 lb 6.4 oz (73.7 kg)    GENERAL:alert, no distress and comfortable SKIN: skin color, texture, turgor are normal, no rashes or significant lesions EYES: normal, Conjunctiva are pink and non-injected, sclera clear OROPHARYNX:no exudate, no erythema and lips, buccal mucosa, and tongue normal  NECK: supple, thyroid normal size, non-tender, without nodularity LYMPH:  no palpable lymphadenopathy in the cervical, axillary or inguinal LUNGS: clear to auscultation and percussion with normal breathing effort HEART: regular rate & rhythm and no murmurs and no lower extremity edema ABDOMEN:abdomen soft, non-tender and normal bowel sounds. Feeding tube site looks okay Musculoskeletal:no cyanosis of digits and no clubbing  NEURO: alert & oriented x 3 with fluent speech, no focal motor/sensory deficits  LABORATORY DATA:  I have reviewed the data as listed    Component Value Date/Time   NA 141 06/10/2016 1149   K 4.2 06/10/2016 1149   CL 96 (L) 05/11/2016 1608   CO2 30 (H) 06/10/2016 1149   GLUCOSE 80 06/10/2016 1149   BUN 25.1 06/10/2016 1149   CREATININE 0.9 06/10/2016 1149   CALCIUM 10.0 06/10/2016 1149   PROT 6.8 06/10/2016 1149  ALBUMIN 3.6 06/10/2016 1149   AST 13 06/10/2016 1149   ALT 16 06/10/2016 1149   ALKPHOS 49 06/10/2016 1149   BILITOT 0.32 06/10/2016 1149   GFRNONAA >60 05/11/2016 1608   GFRAA >60 05/11/2016 1608    No results found for: SPEP, UPEP  Lab Results  Component Value Date   WBC 4.2 06/10/2016   NEUTROABS 3.0 06/10/2016   HGB 9.9 (L) 06/10/2016   HCT 29.5 (L) 06/10/2016   MCV 87.4 06/10/2016   PLT 380 06/10/2016      Chemistry      Component Value Date/Time   NA 141 06/10/2016 1149   K 4.2 06/10/2016 1149   CL 96 (L) 05/11/2016 1608   CO2 30 (H) 06/10/2016 1149   BUN 25.1 06/10/2016 1149   CREATININE 0.9 06/10/2016 1149       Component Value Date/Time   CALCIUM 10.0 06/10/2016 1149   ALKPHOS 49 06/10/2016 1149   AST 13 06/10/2016 1149   ALT 16 06/10/2016 1149   BILITOT 0.32 06/10/2016 1149      ASSESSMENT & PLAN:  Tonsil cancer ([place]) He has completed all chemotherapy and radiation therapy He is recovering well from side effects of treatment. He felt that IV fluids had to help him. He is gaining weight. I recommend continue IV fluids for now  Dysphagia He complained of persistent dysphagia. It is mildly improving. He would appreciate speech and language therapist to teach him how to do swallow exercise accurately.  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 The patient denies recent history of bleeding such as epistaxis, hematuria or hematochezia. He is asymptomatic from the anemia. We will observe for now.  He does not require transfusion now.     No orders of the defined types were placed in this encounter.  All questions were answered. The patient knows to call the clinic with any problems, questions or concerns. No barriers to learning was detected. I spent 15 minutes counseling the patient face to face. The total time spent in the appointment was 20 minutes and more than 50% was on counseling and review of test results     Heath Lark, MD 06/13/2016 5:50 PM

## 2016-06-13 NOTE — Assessment & Plan Note (Signed)
He has completed all chemotherapy and radiation therapy He is recovering well from side effects of treatment. He felt that IV fluids had to help him. He is gaining weight. I recommend continue IV fluids for now

## 2016-06-14 ENCOUNTER — Ambulatory Visit (HOSPITAL_BASED_OUTPATIENT_CLINIC_OR_DEPARTMENT_OTHER): Payer: BLUE CROSS/BLUE SHIELD

## 2016-06-14 VITALS — BP 142/86 | HR 65 | Temp 98.4°F | Resp 18

## 2016-06-14 DIAGNOSIS — C099 Malignant neoplasm of tonsil, unspecified: Secondary | ICD-10-CM

## 2016-06-14 MED ORDER — SODIUM CHLORIDE 0.9% FLUSH
10.0000 mL | INTRAVENOUS | Status: DC | PRN
  Administered 2016-06-14: 10 mL
  Filled 2016-06-14: qty 10

## 2016-06-14 MED ORDER — HEPARIN SOD (PORK) LOCK FLUSH 100 UNIT/ML IV SOLN
500.0000 [IU] | Freq: Once | INTRAVENOUS | Status: AC | PRN
  Administered 2016-06-14: 500 [IU]
  Filled 2016-06-14: qty 5

## 2016-06-14 MED ORDER — SODIUM CHLORIDE 0.9 % IV SOLN
Freq: Once | INTRAVENOUS | Status: AC
  Administered 2016-06-14: 08:00:00 via INTRAVENOUS

## 2016-06-16 ENCOUNTER — Ambulatory Visit: Payer: BLUE CROSS/BLUE SHIELD | Admitting: Nutrition

## 2016-06-16 DIAGNOSIS — C090 Malignant neoplasm of tonsillar fossa: Secondary | ICD-10-CM

## 2016-06-16 MED ORDER — OSMOLITE 1.5 CAL PO LIQD: ORAL | 0 refills | Status: DC

## 2016-06-16 NOTE — Progress Notes (Signed)
New TF orders written and [facility] notified. Osmolite 1.5 increased to 7 cans daily. Patient denies tolerance problems.

## 2016-06-17 ENCOUNTER — Ambulatory Visit (HOSPITAL_BASED_OUTPATIENT_CLINIC_OR_DEPARTMENT_OTHER): Payer: BLUE CROSS/BLUE SHIELD | Admitting: Nurse Practitioner

## 2016-06-17 ENCOUNTER — Encounter: Payer: Self-pay | Admitting: Nurse Practitioner

## 2016-06-17 VITALS — BP 137/83 | HR 70 | Temp 98.2°F | Resp 20 | Ht 70.0 in | Wt 162.7 lb

## 2016-06-17 DIAGNOSIS — C099 Malignant neoplasm of tonsil, unspecified: Secondary | ICD-10-CM | POA: Diagnosis not present

## 2016-06-17 DIAGNOSIS — E860 Dehydration: Secondary | ICD-10-CM

## 2016-06-17 MED ORDER — SODIUM CHLORIDE 0.9 % IV SOLN
Freq: Once | INTRAVENOUS | Status: AC
  Administered 2016-06-17: 09:00:00 via INTRAVENOUS

## 2016-06-17 MED ORDER — SODIUM CHLORIDE 0.9% FLUSH
10.0000 mL | INTRAVENOUS | Status: DC | PRN
  Administered 2016-06-17: 10 mL
  Filled 2016-06-17: qty 10

## 2016-06-17 MED ORDER — HEPARIN SOD (PORK) LOCK FLUSH 100 UNIT/ML IV SOLN
500.0000 [IU] | Freq: Once | INTRAVENOUS | Status: AC | PRN
  Administered 2016-06-17: 500 [IU]
  Filled 2016-06-17: qty 5

## 2016-06-17 NOTE — Progress Notes (Signed)
RN visit only. 

## 2016-06-17 NOTE — Patient Instructions (Signed)
Dehydration, Adult Dehydration is a condition in which there is not enough fluid or water in the body. This happens when you lose more fluids than you take in. Important organs, such as the kidneys, brain, and heart, cannot function without a proper amount of fluids. Any loss of fluids from the body can lead to dehydration. Dehydration can range from mild to severe. This condition should be treated right away to prevent it from becoming severe. What are the causes? This condition may be caused by:  Vomiting.  Diarrhea.  Excessive sweating, such as from heat exposure or exercise.  Not drinking enough fluid, especially:  When ill.  While doing activity that requires a lot of energy.  Excessive urination.  Fever.  Infection.  Certain medicines, such as medicines that cause the body to lose excess fluid (diuretics).  Inability to access safe drinking water.  Reduced physical ability to get adequate water and food. What increases the risk? This condition is more likely to develop in people:  Who have a poorly controlled long-term (chronic) illness, such as diabetes, heart disease, or kidney disease.  Who are age 65 or older.  Who are disabled.  Who live in a place with high altitude.  Who play endurance sports. What are the signs or symptoms? Symptoms of mild dehydration may include:   Thirst.  Dry lips.  Slightly dry mouth.  Dry, warm skin.  Dizziness. Symptoms of moderate dehydration may include:   Very dry mouth.  Muscle cramps.  Dark urine. Urine may be the color of tea.  Decreased urine production.  Decreased tear production.  Heartbeat that is irregular or faster than normal (palpitations).  Headache.  Light-headedness, especially when you stand up from a sitting position.  Fainting (syncope). Symptoms of severe dehydration may include:   Changes in skin, such as:  Cold and clammy skin.  Blotchy (mottled) or pale skin.  Skin that does  not quickly return to normal after being lightly pinched and released (poor skin turgor).  Changes in body fluids, such as:  Extreme thirst.  No tear production.  Inability to sweat when body temperature is high, such as in hot weather.  Very little urine production.  Changes in vital signs, such as:  Weak pulse.  Pulse that is more than 100 beats a minute when sitting still.  Rapid breathing.  Low blood pressure.  Other changes, such as:  Sunken eyes.  Cold hands and feet.  Confusion.  Lack of energy (lethargy).  Difficulty waking up from sleep.  Short-term weight loss.  Unconsciousness. How is this diagnosed? This condition is diagnosed based on your symptoms and a physical exam. Blood and urine tests may be done to help confirm the diagnosis. How is this treated? Treatment for this condition depends on the severity. Mild or moderate dehydration can often be treated at home. Treatment should be started right away. Do not wait until dehydration becomes severe. Severe dehydration is an emergency and it needs to be treated in a hospital. Treatment for mild dehydration may include:   Drinking more fluids.  Replacing salts and minerals in your blood (electrolytes) that you may have lost. Treatment for moderate dehydration may include:   Drinking an oral rehydration solution (ORS). This is a drink that helps you replace fluids and electrolytes (rehydrate). It can be found at pharmacies and retail stores. Treatment for severe dehydration may include:   Receiving fluids through an IV tube.  Receiving an electrolyte solution through a feeding tube that is   passed through your nose and into your stomach (nasogastric tube, or NG tube).  Correcting any abnormalities in electrolytes.  Treating the underlying cause of dehydration. Follow these instructions at home:  If directed by your health care provider, drink an ORS:  Make an ORS by following instructions on the  package.  Start by drinking small amounts, about  cup (120 mL) every 5-10 minutes.  Slowly increase how much you drink until you have taken the amount recommended by your health care provider.  Drink enough clear fluid to keep your urine clear or pale yellow. If you were told to drink an ORS, finish the ORS first, then start slowly drinking other clear fluids. Drink fluids such as:  Water. Do not drink only water. Doing that can lead to having too little salt (sodium) in the body (hyponatremia).  Ice chips.  Fruit juice that you have added water to (diluted fruit juice).  Low-calorie sports drinks.  Avoid:  Alcohol.  Drinks that contain a lot of sugar. These include high-calorie sports drinks, fruit juice that is not diluted, and soda.  Caffeine.  Foods that are greasy or contain a lot of fat or sugar.  Take over-the-counter and prescription medicines only as told by your health care provider.  Do not take sodium tablets. This can lead to having too much sodium in the body (hypernatremia).  Eat foods that contain a healthy balance of electrolytes, such as bananas, oranges, potatoes, tomatoes, and spinach.  Keep all follow-up visits as told by your health care provider. This is important. Contact a health care provider if:  You have abdominal pain that:  Gets worse.  Stays in one area (localizes).  You have a rash.  You have a stiff neck.  You are more irritable than usual.  You are sleepier or more difficult to wake up than usual.  You feel weak or dizzy.  You feel very thirsty.  You have urinated only a small amount of very dark urine over 6-8 hours. Get help right away if:  You have symptoms of severe dehydration.  You cannot drink fluids without vomiting.  Your symptoms get worse with treatment.  You have a fever.  You have a severe headache.  You have vomiting or diarrhea that:  Gets worse.  Does not go away.  You have blood or green matter  (bile) in your vomit.  You have blood in your stool. This may cause stool to look black and tarry.  You have not urinated in 6-8 hours.  You faint.  Your heart rate while sitting still is over 100 beats a minute.  You have trouble breathing. This information is not intended to replace advice given to you by your health care provider. Make sure you discuss any questions you have with your health care provider. Document Released: 04/28/2005 Document Revised: 11/23/2015 Document Reviewed: 06/22/2015 Elsevier Interactive Patient Education  2017 Elsevier Inc.  

## 2016-06-19 ENCOUNTER — Ambulatory Visit (HOSPITAL_BASED_OUTPATIENT_CLINIC_OR_DEPARTMENT_OTHER): Payer: BLUE CROSS/BLUE SHIELD

## 2016-06-19 VITALS — BP 141/90 | HR 66 | Temp 98.3°F | Resp 18

## 2016-06-19 DIAGNOSIS — E860 Dehydration: Secondary | ICD-10-CM | POA: Diagnosis not present

## 2016-06-19 DIAGNOSIS — C099 Malignant neoplasm of tonsil, unspecified: Secondary | ICD-10-CM

## 2016-06-19 MED ORDER — SODIUM CHLORIDE 0.9 % IV SOLN
Freq: Once | INTRAVENOUS | Status: AC
  Administered 2016-06-19: 14:00:00 via INTRAVENOUS

## 2016-06-19 MED FILL — METOCLOPRAMIDE 5 MG/5 ML SY: 5 | 6 days supply | Qty: 240 | Fill #3

## 2016-06-19 NOTE — Patient Instructions (Signed)
Dehydration, Adult Dehydration is a condition in which there is not enough fluid or water in the body. This happens when you lose more fluids than you take in. Important organs, such as the kidneys, brain, and heart, cannot function without a proper amount of fluids. Any loss of fluids from the body can lead to dehydration. Dehydration can range from mild to severe. This condition should be treated right away to prevent it from becoming severe. What are the causes? This condition may be caused by:  Vomiting.  Diarrhea.  Excessive sweating, such as from heat exposure or exercise.  Not drinking enough fluid, especially:  When ill.  While doing activity that requires a lot of energy.  Excessive urination.  Fever.  Infection.  Certain medicines, such as medicines that cause the body to lose excess fluid (diuretics).  Inability to access safe drinking water.  Reduced physical ability to get adequate water and food. What increases the risk? This condition is more likely to develop in people:  Who have a poorly controlled long-term (chronic) illness, such as diabetes, heart disease, or kidney disease.  Who are age 65 or older.  Who are disabled.  Who live in a place with high altitude.  Who play endurance sports. What are the signs or symptoms? Symptoms of mild dehydration may include:   Thirst.  Dry lips.  Slightly dry mouth.  Dry, warm skin.  Dizziness. Symptoms of moderate dehydration may include:   Very dry mouth.  Muscle cramps.  Dark urine. Urine may be the color of tea.  Decreased urine production.  Decreased tear production.  Heartbeat that is irregular or faster than normal (palpitations).  Headache.  Light-headedness, especially when you stand up from a sitting position.  Fainting (syncope). Symptoms of severe dehydration may include:   Changes in skin, such as:  Cold and clammy skin.  Blotchy (mottled) or pale skin.  Skin that does  not quickly return to normal after being lightly pinched and released (poor skin turgor).  Changes in body fluids, such as:  Extreme thirst.  No tear production.  Inability to sweat when body temperature is high, such as in hot weather.  Very little urine production.  Changes in vital signs, such as:  Weak pulse.  Pulse that is more than 100 beats a minute when sitting still.  Rapid breathing.  Low blood pressure.  Other changes, such as:  Sunken eyes.  Cold hands and feet.  Confusion.  Lack of energy (lethargy).  Difficulty waking up from sleep.  Short-term weight loss.  Unconsciousness. How is this diagnosed? This condition is diagnosed based on your symptoms and a physical exam. Blood and urine tests may be done to help confirm the diagnosis. How is this treated? Treatment for this condition depends on the severity. Mild or moderate dehydration can often be treated at home. Treatment should be started right away. Do not wait until dehydration becomes severe. Severe dehydration is an emergency and it needs to be treated in a hospital. Treatment for mild dehydration may include:   Drinking more fluids.  Replacing salts and minerals in your blood (electrolytes) that you may have lost. Treatment for moderate dehydration may include:   Drinking an oral rehydration solution (ORS). This is a drink that helps you replace fluids and electrolytes (rehydrate). It can be found at pharmacies and retail stores. Treatment for severe dehydration may include:   Receiving fluids through an IV tube.  Receiving an electrolyte solution through a feeding tube that is   passed through your nose and into your stomach (nasogastric tube, or NG tube).  Correcting any abnormalities in electrolytes.  Treating the underlying cause of dehydration. Follow these instructions at home:  If directed by your health care provider, drink an ORS:  Make an ORS by following instructions on the  package.  Start by drinking small amounts, about  cup (120 mL) every 5-10 minutes.  Slowly increase how much you drink until you have taken the amount recommended by your health care provider.  Drink enough clear fluid to keep your urine clear or pale yellow. If you were told to drink an ORS, finish the ORS first, then start slowly drinking other clear fluids. Drink fluids such as:  Water. Do not drink only water. Doing that can lead to having too little salt (sodium) in the body (hyponatremia).  Ice chips.  Fruit juice that you have added water to (diluted fruit juice).  Low-calorie sports drinks.  Avoid:  Alcohol.  Drinks that contain a lot of sugar. These include high-calorie sports drinks, fruit juice that is not diluted, and soda.  Caffeine.  Foods that are greasy or contain a lot of fat or sugar.  Take over-the-counter and prescription medicines only as told by your health care provider.  Do not take sodium tablets. This can lead to having too much sodium in the body (hypernatremia).  Eat foods that contain a healthy balance of electrolytes, such as bananas, oranges, potatoes, tomatoes, and spinach.  Keep all follow-up visits as told by your health care provider. This is important. Contact a health care provider if:  You have abdominal pain that:  Gets worse.  Stays in one area (localizes).  You have a rash.  You have a stiff neck.  You are more irritable than usual.  You are sleepier or more difficult to wake up than usual.  You feel weak or dizzy.  You feel very thirsty.  You have urinated only a small amount of very dark urine over 6-8 hours. Get help right away if:  You have symptoms of severe dehydration.  You cannot drink fluids without vomiting.  Your symptoms get worse with treatment.  You have a fever.  You have a severe headache.  You have vomiting or diarrhea that:  Gets worse.  Does not go away.  You have blood or green matter  (bile) in your vomit.  You have blood in your stool. This may cause stool to look black and tarry.  You have not urinated in 6-8 hours.  You faint.  Your heart rate while sitting still is over 100 beats a minute.  You have trouble breathing. This information is not intended to replace advice given to you by your health care provider. Make sure you discuss any questions you have with your health care provider. Document Released: 04/28/2005 Document Revised: 11/23/2015 Document Reviewed: 06/22/2015 Elsevier Interactive Patient Education  2017 Elsevier Inc.  

## 2016-06-21 ENCOUNTER — Telehealth: Payer: Self-pay | Admitting: *Deleted

## 2016-06-21 ENCOUNTER — Ambulatory Visit (HOSPITAL_BASED_OUTPATIENT_CLINIC_OR_DEPARTMENT_OTHER): Payer: BLUE CROSS/BLUE SHIELD

## 2016-06-21 VITALS — BP 138/80 | HR 68 | Temp 98.6°F | Resp 18

## 2016-06-21 DIAGNOSIS — C099 Malignant neoplasm of tonsil, unspecified: Secondary | ICD-10-CM | POA: Diagnosis not present

## 2016-06-21 MED ORDER — SODIUM CHLORIDE 0.9 % IV SOLN
Freq: Once | INTRAVENOUS | Status: AC
  Administered 2016-06-21: 08:00:00 via INTRAVENOUS

## 2016-06-21 MED ORDER — SODIUM CHLORIDE 0.9% FLUSH
10.0000 mL | Freq: Once | INTRAVENOUS | Status: AC
  Administered 2016-06-21: 10 mL
  Filled 2016-06-21: qty 10

## 2016-06-21 MED ORDER — HEPARIN SOD (PORK) LOCK FLUSH 100 UNIT/ML IV SOLN
500.0000 [IU] | Freq: Once | INTRAVENOUS | Status: AC
  Administered 2016-06-21: 500 [IU]
  Filled 2016-06-21: qty 5

## 2016-06-21 NOTE — Patient Instructions (Signed)
Dehydration, Adult Dehydration is when there is not enough fluid or water in your body. This happens when you lose more fluids than you take in. Dehydration can range from mild to very bad. It should be treated right away to keep it from getting very bad. Symptoms of mild dehydration may include:   Thirst.  Dry lips.  Slightly dry mouth.  Dry, warm skin.  Dizziness. Symptoms of moderate dehydration may include:   Very dry mouth.  Muscle cramps.  Dark pee (urine). Pee may be the color of tea.  Your body making less pee.  Your eyes making fewer tears.  Heartbeat that is uneven or faster than normal (palpitations).  Headache.  Light-headedness, especially when you stand up from sitting.  Fainting (syncope). Symptoms of very bad dehydration may include:   Changes in skin, such as:  Cold and clammy skin.  Blotchy (mottled) or pale skin.  Skin that does not quickly return to normal after being lightly pinched and let go (poor skin turgor).  Changes in body fluids, such as:  Feeling very thirsty.  Your eyes making fewer tears.  Not sweating when body temperature is high, such as in hot weather.  Your body making very little pee.  Changes in vital signs, such as:  Weak pulse.  Pulse that is more than 100 beats a minute when you are sitting still.  Fast breathing.  Low blood pressure.  Other changes, such as:  Sunken eyes.  Cold hands and feet.  Confusion.  Lack of energy (lethargy).  Trouble waking up from sleep.  Short-term weight loss.  Unconsciousness. Follow these instructions at home:  If told by your doctor, drink an ORS:  Make an ORS by using instructions on the package.  Start by drinking small amounts, about  cup (120 mL) every 5-10 minutes.  Slowly drink more until you have had the amount that your doctor said to have.  Drink enough clear fluid to keep your pee clear or pale yellow. If you were told to drink an ORS, finish the  ORS first, then start slowly drinking clear fluids. Drink fluids such as:  Water. Do not drink only water by itself. Doing that can make the salt (sodium) level in your body get too low (hyponatremia).  Ice chips.  Fruit juice that you have added water to (diluted).  Low-calorie sports drinks.  Avoid:  Alcohol.  Drinks that have a lot of sugar. These include high-calorie sports drinks, fruit juice that does not have water added, and soda.  Caffeine.  Foods that are greasy or have a lot of fat or sugar.  Take over-the-counter and prescription medicines only as told by your doctor.  Do not take salt tablets. Doing that can make the salt level in your body get too high (hypernatremia).  Eat foods that have minerals (electrolytes). Examples include bananas, oranges, potatoes, tomatoes, and spinach.  Keep all follow-up visits as told by your doctor. This is important. Contact a doctor if:  You have belly (abdominal) pain that:  Gets worse.  Stays in one area (localizes).  You have a rash.  You have a stiff neck.  You get angry or annoyed more easily than normal (irritability).  You are more sleepy than normal.  You have a harder time waking up than normal.  You feel:  Weak.  Dizzy.  Very thirsty.  You have peed (urinated) only a small amount of very dark pee during 6-8 hours. Get help right away if:  You   have symptoms of very bad dehydration.  You cannot drink fluids without throwing up (vomiting).  Your symptoms get worse with treatment.  You have a fever.  You have a very bad headache.  You are throwing up or having watery poop (diarrhea) and it:  Gets worse.  Does not go away.  You have blood or something green (bile) in your throw-up.  You have blood in your poop (stool). This may cause poop to look black and tarry.  You have not peed in 6-8 hours.  You pass out (faint).  Your heart rate when you are sitting still is more than 100 beats a  minute.  You have trouble breathing. This information is not intended to replace advice given to you by your health care provider. Make sure you discuss any questions you have with your health care provider. Document Released: 02/22/2009 Document Revised: 11/16/2015 Document Reviewed: 06/22/2015 Elsevier Interactive Patient Education  2017 Elsevier Inc.  

## 2016-06-21 NOTE — Telephone Encounter (Signed)
Patient here for IV fluids requesting refill on his EMLA cream. Pt was advised he should have 2 available at the pharmacy. He will check in with them on Monday and have them contact us if they need an updated script.

## 2016-06-23 ENCOUNTER — Telehealth: Payer: Self-pay | Admitting: *Deleted

## 2016-06-23 NOTE — Telephone Encounter (Signed)
Oncology Nurse Navigator Documentation  Received call from [facility], patient's wife. She stated Todd [name] has just a few days worth of Osmolite 1.5 supplement, needs another order faxed to [facility] to reflect increased usage per his most recent visit with Raford Pitcher, RD.   I referred to Barb's most recent 2/5 note which states [facility] had been notified. I informed Jenny Reichmann I would forward request to [facility].  Gayleen Orem, RN, BSN, [place] [facility] Oncology Nurse [place] [facility] [place] [phone]

## 2016-06-24 ENCOUNTER — Encounter: Payer: Self-pay | Admitting: *Deleted

## 2016-06-24 ENCOUNTER — Ambulatory Visit (HOSPITAL_BASED_OUTPATIENT_CLINIC_OR_DEPARTMENT_OTHER): Payer: BLUE CROSS/BLUE SHIELD

## 2016-06-24 ENCOUNTER — Telehealth: Payer: Self-pay | Admitting: *Deleted

## 2016-06-24 VITALS — BP 132/82 | HR 71 | Temp 98.4°F | Resp 18

## 2016-06-24 DIAGNOSIS — E860 Dehydration: Secondary | ICD-10-CM

## 2016-06-24 DIAGNOSIS — C099 Malignant neoplasm of tonsil, unspecified: Secondary | ICD-10-CM | POA: Diagnosis not present

## 2016-06-24 MED ORDER — SODIUM CHLORIDE 0.9% FLUSH
10.0000 mL | INTRAVENOUS | Status: DC | PRN
  Administered 2016-06-24: 10 mL
  Filled 2016-06-24: qty 10

## 2016-06-24 MED ORDER — HEPARIN SOD (PORK) LOCK FLUSH 100 UNIT/ML IV SOLN
250.0000 [IU] | Freq: Once | INTRAVENOUS | Status: AC | PRN
  Administered 2016-06-24: 500 [IU]
  Filled 2016-06-24: qty 5

## 2016-06-24 MED ORDER — SODIUM CHLORIDE 0.9 % IV SOLN
Freq: Once | INTRAVENOUS | Status: AC
  Administered 2016-06-24: 13:00:00 via INTRAVENOUS

## 2016-06-24 NOTE — Telephone Encounter (Signed)
Per patient request I have moved appt from 2/22 to 2/21.

## 2016-06-24 NOTE — Patient Instructions (Signed)
Dehydration, Adult Dehydration is a condition in which there is not enough fluid or water in the body. This happens when you lose more fluids than you take in. Important organs, such as the kidneys, brain, and heart, cannot function without a proper amount of fluids. Any loss of fluids from the body can lead to dehydration. Dehydration can range from mild to severe. This condition should be treated right away to prevent it from becoming severe. What are the causes? This condition may be caused by:  Vomiting.  Diarrhea.  Excessive sweating, such as from heat exposure or exercise.  Not drinking enough fluid, especially:  When ill.  While doing activity that requires a lot of energy.  Excessive urination.  Fever.  Infection.  Certain medicines, such as medicines that cause the body to lose excess fluid (diuretics).  Inability to access safe drinking water.  Reduced physical ability to get adequate water and food. What increases the risk? This condition is more likely to develop in people:  Who have a poorly controlled long-term (chronic) illness, such as diabetes, heart disease, or kidney disease.  Who are age 65 or older.  Who are disabled.  Who live in a place with high altitude.  Who play endurance sports. What are the signs or symptoms? Symptoms of mild dehydration may include:   Thirst.  Dry lips.  Slightly dry mouth.  Dry, warm skin.  Dizziness. Symptoms of moderate dehydration may include:   Very dry mouth.  Muscle cramps.  Dark urine. Urine may be the color of tea.  Decreased urine production.  Decreased tear production.  Heartbeat that is irregular or faster than normal (palpitations).  Headache.  Light-headedness, especially when you stand up from a sitting position.  Fainting (syncope). Symptoms of severe dehydration may include:   Changes in skin, such as:  Cold and clammy skin.  Blotchy (mottled) or pale skin.  Skin that does  not quickly return to normal after being lightly pinched and released (poor skin turgor).  Changes in body fluids, such as:  Extreme thirst.  No tear production.  Inability to sweat when body temperature is high, such as in hot weather.  Very little urine production.  Changes in vital signs, such as:  Weak pulse.  Pulse that is more than 100 beats a minute when sitting still.  Rapid breathing.  Low blood pressure.  Other changes, such as:  Sunken eyes.  Cold hands and feet.  Confusion.  Lack of energy (lethargy).  Difficulty waking up from sleep.  Short-term weight loss.  Unconsciousness. How is this diagnosed? This condition is diagnosed based on your symptoms and a physical exam. Blood and urine tests may be done to help confirm the diagnosis. How is this treated? Treatment for this condition depends on the severity. Mild or moderate dehydration can often be treated at home. Treatment should be started right away. Do not wait until dehydration becomes severe. Severe dehydration is an emergency and it needs to be treated in a hospital. Treatment for mild dehydration may include:   Drinking more fluids.  Replacing salts and minerals in your blood (electrolytes) that you may have lost. Treatment for moderate dehydration may include:   Drinking an oral rehydration solution (ORS). This is a drink that helps you replace fluids and electrolytes (rehydrate). It can be found at pharmacies and retail stores. Treatment for severe dehydration may include:   Receiving fluids through an IV tube.  Receiving an electrolyte solution through a feeding tube that is   passed through your nose and into your stomach (nasogastric tube, or NG tube).  Correcting any abnormalities in electrolytes.  Treating the underlying cause of dehydration. Follow these instructions at home:  If directed by your health care provider, drink an ORS:  Make an ORS by following instructions on the  package.  Start by drinking small amounts, about  cup (120 mL) every 5-10 minutes.  Slowly increase how much you drink until you have taken the amount recommended by your health care provider.  Drink enough clear fluid to keep your urine clear or pale yellow. If you were told to drink an ORS, finish the ORS first, then start slowly drinking other clear fluids. Drink fluids such as:  Water. Do not drink only water. Doing that can lead to having too little salt (sodium) in the body (hyponatremia).  Ice chips.  Fruit juice that you have added water to (diluted fruit juice).  Low-calorie sports drinks.  Avoid:  Alcohol.  Drinks that contain a lot of sugar. These include high-calorie sports drinks, fruit juice that is not diluted, and soda.  Caffeine.  Foods that are greasy or contain a lot of fat or sugar.  Take over-the-counter and prescription medicines only as told by your health care provider.  Do not take sodium tablets. This can lead to having too much sodium in the body (hypernatremia).  Eat foods that contain a healthy balance of electrolytes, such as bananas, oranges, potatoes, tomatoes, and spinach.  Keep all follow-up visits as told by your health care provider. This is important. Contact a health care provider if:  You have abdominal pain that:  Gets worse.  Stays in one area (localizes).  You have a rash.  You have a stiff neck.  You are more irritable than usual.  You are sleepier or more difficult to wake up than usual.  You feel weak or dizzy.  You feel very thirsty.  You have urinated only a small amount of very dark urine over 6-8 hours. Get help right away if:  You have symptoms of severe dehydration.  You cannot drink fluids without vomiting.  Your symptoms get worse with treatment.  You have a fever.  You have a severe headache.  You have vomiting or diarrhea that:  Gets worse.  Does not go away.  You have blood or green matter  (bile) in your vomit.  You have blood in your stool. This may cause stool to look black and tarry.  You have not urinated in 6-8 hours.  You faint.  Your heart rate while sitting still is over 100 beats a minute.  You have trouble breathing. This information is not intended to replace advice given to you by your health care provider. Make sure you discuss any questions you have with your health care provider. Document Released: 04/28/2005 Document Revised: 11/23/2015 Document Reviewed: 06/22/2015 Elsevier Interactive Patient Education  2017 Elsevier Inc.  

## 2016-06-26 ENCOUNTER — Ambulatory Visit (HOSPITAL_BASED_OUTPATIENT_CLINIC_OR_DEPARTMENT_OTHER): Payer: BLUE CROSS/BLUE SHIELD

## 2016-06-26 VITALS — BP 129/74 | HR 65 | Temp 97.9°F | Resp 18

## 2016-06-26 DIAGNOSIS — C099 Malignant neoplasm of tonsil, unspecified: Secondary | ICD-10-CM

## 2016-06-26 DIAGNOSIS — E860 Dehydration: Secondary | ICD-10-CM | POA: Diagnosis not present

## 2016-06-26 MED ORDER — HEPARIN SOD (PORK) LOCK FLUSH 100 UNIT/ML IV SOLN
250.0000 [IU] | Freq: Once | INTRAVENOUS | Status: AC | PRN
  Administered 2016-06-26: 500 [IU]
  Filled 2016-06-26: qty 5

## 2016-06-26 MED ORDER — SODIUM CHLORIDE 0.9% FLUSH
10.0000 mL | INTRAVENOUS | Status: DC | PRN
  Administered 2016-06-26: 10 mL
  Filled 2016-06-26: qty 10

## 2016-06-26 MED ORDER — SODIUM CHLORIDE 0.9 % IV SOLN
Freq: Once | INTRAVENOUS | Status: AC
  Administered 2016-06-26: 12:00:00 via INTRAVENOUS

## 2016-06-26 NOTE — Patient Instructions (Signed)
Dehydration, Adult Dehydration is a condition in which there is not enough fluid or water in the body. This happens when you lose more fluids than you take in. Important organs, such as the kidneys, brain, and heart, cannot function without a proper amount of fluids. Any loss of fluids from the body can lead to dehydration. Dehydration can range from mild to severe. This condition should be treated right away to prevent it from becoming severe. What are the causes? This condition may be caused by:  Vomiting.  Diarrhea.  Excessive sweating, such as from heat exposure or exercise.  Not drinking enough fluid, especially:  When ill.  While doing activity that requires a lot of energy.  Excessive urination.  Fever.  Infection.  Certain medicines, such as medicines that cause the body to lose excess fluid (diuretics).  Inability to access safe drinking water.  Reduced physical ability to get adequate water and food. What increases the risk? This condition is more likely to develop in people:  Who have a poorly controlled long-term (chronic) illness, such as diabetes, heart disease, or kidney disease.  Who are age 65 or older.  Who are disabled.  Who live in a place with high altitude.  Who play endurance sports. What are the signs or symptoms? Symptoms of mild dehydration may include:   Thirst.  Dry lips.  Slightly dry mouth.  Dry, warm skin.  Dizziness. Symptoms of moderate dehydration may include:   Very dry mouth.  Muscle cramps.  Dark urine. Urine may be the color of tea.  Decreased urine production.  Decreased tear production.  Heartbeat that is irregular or faster than normal (palpitations).  Headache.  Light-headedness, especially when you stand up from a sitting position.  Fainting (syncope). Symptoms of severe dehydration may include:   Changes in skin, such as:  Cold and clammy skin.  Blotchy (mottled) or pale skin.  Skin that does  not quickly return to normal after being lightly pinched and released (poor skin turgor).  Changes in body fluids, such as:  Extreme thirst.  No tear production.  Inability to sweat when body temperature is high, such as in hot weather.  Very little urine production.  Changes in vital signs, such as:  Weak pulse.  Pulse that is more than 100 beats a minute when sitting still.  Rapid breathing.  Low blood pressure.  Other changes, such as:  Sunken eyes.  Cold hands and feet.  Confusion.  Lack of energy (lethargy).  Difficulty waking up from sleep.  Short-term weight loss.  Unconsciousness. How is this diagnosed? This condition is diagnosed based on your symptoms and a physical exam. Blood and urine tests may be done to help confirm the diagnosis. How is this treated? Treatment for this condition depends on the severity. Mild or moderate dehydration can often be treated at home. Treatment should be started right away. Do not wait until dehydration becomes severe. Severe dehydration is an emergency and it needs to be treated in a hospital. Treatment for mild dehydration may include:   Drinking more fluids.  Replacing salts and minerals in your blood (electrolytes) that you may have lost. Treatment for moderate dehydration may include:   Drinking an oral rehydration solution (ORS). This is a drink that helps you replace fluids and electrolytes (rehydrate). It can be found at pharmacies and retail stores. Treatment for severe dehydration may include:   Receiving fluids through an IV tube.  Receiving an electrolyte solution through a feeding tube that is   passed through your nose and into your stomach (nasogastric tube, or NG tube).  Correcting any abnormalities in electrolytes.  Treating the underlying cause of dehydration. Follow these instructions at home:  If directed by your health care provider, drink an ORS:  Make an ORS by following instructions on the  package.  Start by drinking small amounts, about  cup (120 mL) every 5-10 minutes.  Slowly increase how much you drink until you have taken the amount recommended by your health care provider.  Drink enough clear fluid to keep your urine clear or pale yellow. If you were told to drink an ORS, finish the ORS first, then start slowly drinking other clear fluids. Drink fluids such as:  Water. Do not drink only water. Doing that can lead to having too little salt (sodium) in the body (hyponatremia).  Ice chips.  Fruit juice that you have added water to (diluted fruit juice).  Low-calorie sports drinks.  Avoid:  Alcohol.  Drinks that contain a lot of sugar. These include high-calorie sports drinks, fruit juice that is not diluted, and soda.  Caffeine.  Foods that are greasy or contain a lot of fat or sugar.  Take over-the-counter and prescription medicines only as told by your health care provider.  Do not take sodium tablets. This can lead to having too much sodium in the body (hypernatremia).  Eat foods that contain a healthy balance of electrolytes, such as bananas, oranges, potatoes, tomatoes, and spinach.  Keep all follow-up visits as told by your health care provider. This is important. Contact a health care provider if:  You have abdominal pain that:  Gets worse.  Stays in one area (localizes).  You have a rash.  You have a stiff neck.  You are more irritable than usual.  You are sleepier or more difficult to wake up than usual.  You feel weak or dizzy.  You feel very thirsty.  You have urinated only a small amount of very dark urine over 6-8 hours. Get help right away if:  You have symptoms of severe dehydration.  You cannot drink fluids without vomiting.  Your symptoms get worse with treatment.  You have a fever.  You have a severe headache.  You have vomiting or diarrhea that:  Gets worse.  Does not go away.  You have blood or green matter  (bile) in your vomit.  You have blood in your stool. This may cause stool to look black and tarry.  You have not urinated in 6-8 hours.  You faint.  Your heart rate while sitting still is over 100 beats a minute.  You have trouble breathing. This information is not intended to replace advice given to you by your health care provider. Make sure you discuss any questions you have with your health care provider. Document Released: 04/28/2005 Document Revised: 11/23/2015 Document Reviewed: 06/22/2015 Elsevier Interactive Patient Education  2017 Elsevier Inc.  

## 2016-06-28 ENCOUNTER — Ambulatory Visit (HOSPITAL_BASED_OUTPATIENT_CLINIC_OR_DEPARTMENT_OTHER): Payer: BLUE CROSS/BLUE SHIELD

## 2016-06-28 VITALS — BP 147/89 | HR 77 | Temp 98.7°F | Resp 18

## 2016-06-28 DIAGNOSIS — C099 Malignant neoplasm of tonsil, unspecified: Secondary | ICD-10-CM

## 2016-06-28 MED ORDER — HEPARIN SOD (PORK) LOCK FLUSH 100 UNIT/ML IV SOLN
250.0000 [IU] | Freq: Once | INTRAVENOUS | Status: DC | PRN
  Filled 2016-06-28: qty 5

## 2016-06-28 MED ORDER — SODIUM CHLORIDE 0.9 % IV SOLN
Freq: Once | INTRAVENOUS | Status: AC
  Administered 2016-06-28: 09:00:00 via INTRAVENOUS

## 2016-06-28 MED ORDER — SODIUM CHLORIDE 0.9% FLUSH
10.0000 mL | INTRAVENOUS | Status: DC | PRN
  Administered 2016-06-28: 10 mL
  Filled 2016-06-28: qty 10

## 2016-06-28 MED ORDER — HEPARIN SOD (PORK) LOCK FLUSH 100 UNIT/ML IV SOLN
500.0000 [IU] | Freq: Once | INTRAVENOUS | Status: AC
  Administered 2016-06-28: 500 [IU] via INTRAVENOUS
  Filled 2016-06-28: qty 5

## 2016-06-28 NOTE — Patient Instructions (Signed)
Dehydration, Adult Dehydration is a condition in which there is not enough fluid or water in the body. This happens when you lose more fluids than you take in. Important organs, such as the kidneys, brain, and heart, cannot function without a proper amount of fluids. Any loss of fluids from the body can lead to dehydration. Dehydration can range from mild to severe. This condition should be treated right away to prevent it from becoming severe. What are the causes? This condition may be caused by:  Vomiting.  Diarrhea.  Excessive sweating, such as from heat exposure or exercise.  Not drinking enough fluid, especially:  When ill.  While doing activity that requires a lot of energy.  Excessive urination.  Fever.  Infection.  Certain medicines, such as medicines that cause the body to lose excess fluid (diuretics).  Inability to access safe drinking water.  Reduced physical ability to get adequate water and food. What increases the risk? This condition is more likely to develop in people:  Who have a poorly controlled long-term (chronic) illness, such as diabetes, heart disease, or kidney disease.  Who are age 65 or older.  Who are disabled.  Who live in a place with high altitude.  Who play endurance sports. What are the signs or symptoms? Symptoms of mild dehydration may include:   Thirst.  Dry lips.  Slightly dry mouth.  Dry, warm skin.  Dizziness. Symptoms of moderate dehydration may include:   Very dry mouth.  Muscle cramps.  Dark urine. Urine may be the color of tea.  Decreased urine production.  Decreased tear production.  Heartbeat that is irregular or faster than normal (palpitations).  Headache.  Light-headedness, especially when you stand up from a sitting position.  Fainting (syncope). Symptoms of severe dehydration may include:   Changes in skin, such as:  Cold and clammy skin.  Blotchy (mottled) or pale skin.  Skin that does  not quickly return to normal after being lightly pinched and released (poor skin turgor).  Changes in body fluids, such as:  Extreme thirst.  No tear production.  Inability to sweat when body temperature is high, such as in hot weather.  Very little urine production.  Changes in vital signs, such as:  Weak pulse.  Pulse that is more than 100 beats a minute when sitting still.  Rapid breathing.  Low blood pressure.  Other changes, such as:  Sunken eyes.  Cold hands and feet.  Confusion.  Lack of energy (lethargy).  Difficulty waking up from sleep.  Short-term weight loss.  Unconsciousness. How is this diagnosed? This condition is diagnosed based on your symptoms and a physical exam. Blood and urine tests may be done to help confirm the diagnosis. How is this treated? Treatment for this condition depends on the severity. Mild or moderate dehydration can often be treated at home. Treatment should be started right away. Do not wait until dehydration becomes severe. Severe dehydration is an emergency and it needs to be treated in a hospital. Treatment for mild dehydration may include:   Drinking more fluids.  Replacing salts and minerals in your blood (electrolytes) that you may have lost. Treatment for moderate dehydration may include:   Drinking an oral rehydration solution (ORS). This is a drink that helps you replace fluids and electrolytes (rehydrate). It can be found at pharmacies and retail stores. Treatment for severe dehydration may include:   Receiving fluids through an IV tube.  Receiving an electrolyte solution through a feeding tube that is   passed through your nose and into your stomach (nasogastric tube, or NG tube).  Correcting any abnormalities in electrolytes.  Treating the underlying cause of dehydration. Follow these instructions at home:  If directed by your health care provider, drink an ORS:  Make an ORS by following instructions on the  package.  Start by drinking small amounts, about  cup (120 mL) every 5-10 minutes.  Slowly increase how much you drink until you have taken the amount recommended by your health care provider.  Drink enough clear fluid to keep your urine clear or pale yellow. If you were told to drink an ORS, finish the ORS first, then start slowly drinking other clear fluids. Drink fluids such as:  Water. Do not drink only water. Doing that can lead to having too little salt (sodium) in the body (hyponatremia).  Ice chips.  Fruit juice that you have added water to (diluted fruit juice).  Low-calorie sports drinks.  Avoid:  Alcohol.  Drinks that contain a lot of sugar. These include high-calorie sports drinks, fruit juice that is not diluted, and soda.  Caffeine.  Foods that are greasy or contain a lot of fat or sugar.  Take over-the-counter and prescription medicines only as told by your health care provider.  Do not take sodium tablets. This can lead to having too much sodium in the body (hypernatremia).  Eat foods that contain a healthy balance of electrolytes, such as bananas, oranges, potatoes, tomatoes, and spinach.  Keep all follow-up visits as told by your health care provider. This is important. Contact a health care provider if:  You have abdominal pain that:  Gets worse.  Stays in one area (localizes).  You have a rash.  You have a stiff neck.  You are more irritable than usual.  You are sleepier or more difficult to wake up than usual.  You feel weak or dizzy.  You feel very thirsty.  You have urinated only a small amount of very dark urine over 6-8 hours. Get help right away if:  You have symptoms of severe dehydration.  You cannot drink fluids without vomiting.  Your symptoms get worse with treatment.  You have a fever.  You have a severe headache.  You have vomiting or diarrhea that:  Gets worse.  Does not go away.  You have blood or green matter  (bile) in your vomit.  You have blood in your stool. This may cause stool to look black and tarry.  You have not urinated in 6-8 hours.  You faint.  Your heart rate while sitting still is over 100 beats a minute.  You have trouble breathing. This information is not intended to replace advice given to you by your health care provider. Make sure you discuss any questions you have with your health care provider. Document Released: 04/28/2005 Document Revised: 11/23/2015 Document Reviewed: 06/22/2015 Elsevier Interactive Patient Education  2017 Elsevier Inc.  

## 2016-06-30 MED FILL — METOCLOPRAMIDE 5 MG/5 ML SY: 5 | 6 days supply | Qty: 240 | Fill #4

## 2016-07-01 ENCOUNTER — Ambulatory Visit (HOSPITAL_BASED_OUTPATIENT_CLINIC_OR_DEPARTMENT_OTHER): Payer: BLUE CROSS/BLUE SHIELD

## 2016-07-01 VITALS — BP 118/73 | HR 67 | Temp 98.2°F | Resp 18

## 2016-07-01 DIAGNOSIS — E860 Dehydration: Secondary | ICD-10-CM | POA: Diagnosis not present

## 2016-07-01 DIAGNOSIS — C099 Malignant neoplasm of tonsil, unspecified: Secondary | ICD-10-CM | POA: Diagnosis not present

## 2016-07-01 MED ORDER — SODIUM CHLORIDE 0.9 % IV SOLN
Freq: Once | INTRAVENOUS | Status: AC
  Administered 2016-07-01: 13:00:00 via INTRAVENOUS

## 2016-07-01 MED ORDER — SODIUM CHLORIDE 0.9% FLUSH
10.0000 mL | INTRAVENOUS | Status: DC | PRN
  Administered 2016-07-01: 10 mL
  Filled 2016-07-01: qty 10

## 2016-07-01 MED ORDER — HEPARIN SOD (PORK) LOCK FLUSH 100 UNIT/ML IV SOLN
500.0000 [IU] | Freq: Once | INTRAVENOUS | Status: AC | PRN
  Administered 2016-07-01: 500 [IU]
  Filled 2016-07-01: qty 5

## 2016-07-01 NOTE — Patient Instructions (Signed)
Dehydration, Adult Dehydration is a condition in which there is not enough fluid or water in the body. This happens when you lose more fluids than you take in. Important organs, such as the kidneys, brain, and heart, cannot function without a proper amount of fluids. Any loss of fluids from the body can lead to dehydration. Dehydration can range from mild to severe. This condition should be treated right away to prevent it from becoming severe. What are the causes? This condition may be caused by:  Vomiting.  Diarrhea.  Excessive sweating, such as from heat exposure or exercise.  Not drinking enough fluid, especially:  When ill.  While doing activity that requires a lot of energy.  Excessive urination.  Fever.  Infection.  Certain medicines, such as medicines that cause the body to lose excess fluid (diuretics).  Inability to access safe drinking water.  Reduced physical ability to get adequate water and food. What increases the risk? This condition is more likely to develop in people:  Who have a poorly controlled long-term (chronic) illness, such as diabetes, heart disease, or kidney disease.  Who are age 65 or older.  Who are disabled.  Who live in a place with high altitude.  Who play endurance sports. What are the signs or symptoms? Symptoms of mild dehydration may include:   Thirst.  Dry lips.  Slightly dry mouth.  Dry, warm skin.  Dizziness. Symptoms of moderate dehydration may include:   Very dry mouth.  Muscle cramps.  Dark urine. Urine may be the color of tea.  Decreased urine production.  Decreased tear production.  Heartbeat that is irregular or faster than normal (palpitations).  Headache.  Light-headedness, especially when you stand up from a sitting position.  Fainting (syncope). Symptoms of severe dehydration may include:   Changes in skin, such as:  Cold and clammy skin.  Blotchy (mottled) or pale skin.  Skin that does  not quickly return to normal after being lightly pinched and released (poor skin turgor).  Changes in body fluids, such as:  Extreme thirst.  No tear production.  Inability to sweat when body temperature is high, such as in hot weather.  Very little urine production.  Changes in vital signs, such as:  Weak pulse.  Pulse that is more than 100 beats a minute when sitting still.  Rapid breathing.  Low blood pressure.  Other changes, such as:  Sunken eyes.  Cold hands and feet.  Confusion.  Lack of energy (lethargy).  Difficulty waking up from sleep.  Short-term weight loss.  Unconsciousness. How is this diagnosed? This condition is diagnosed based on your symptoms and a physical exam. Blood and urine tests may be done to help confirm the diagnosis. How is this treated? Treatment for this condition depends on the severity. Mild or moderate dehydration can often be treated at home. Treatment should be started right away. Do not wait until dehydration becomes severe. Severe dehydration is an emergency and it needs to be treated in a hospital. Treatment for mild dehydration may include:   Drinking more fluids.  Replacing salts and minerals in your blood (electrolytes) that you may have lost. Treatment for moderate dehydration may include:   Drinking an oral rehydration solution (ORS). This is a drink that helps you replace fluids and electrolytes (rehydrate). It can be found at pharmacies and retail stores. Treatment for severe dehydration may include:   Receiving fluids through an IV tube.  Receiving an electrolyte solution through a feeding tube that is   passed through your nose and into your stomach (nasogastric tube, or NG tube).  Correcting any abnormalities in electrolytes.  Treating the underlying cause of dehydration. Follow these instructions at home:  If directed by your health care provider, drink an ORS:  Make an ORS by following instructions on the  package.  Start by drinking small amounts, about  cup (120 mL) every 5-10 minutes.  Slowly increase how much you drink until you have taken the amount recommended by your health care provider.  Drink enough clear fluid to keep your urine clear or pale yellow. If you were told to drink an ORS, finish the ORS first, then start slowly drinking other clear fluids. Drink fluids such as:  Water. Do not drink only water. Doing that can lead to having too little salt (sodium) in the body (hyponatremia).  Ice chips.  Fruit juice that you have added water to (diluted fruit juice).  Low-calorie sports drinks.  Avoid:  Alcohol.  Drinks that contain a lot of sugar. These include high-calorie sports drinks, fruit juice that is not diluted, and soda.  Caffeine.  Foods that are greasy or contain a lot of fat or sugar.  Take over-the-counter and prescription medicines only as told by your health care provider.  Do not take sodium tablets. This can lead to having too much sodium in the body (hypernatremia).  Eat foods that contain a healthy balance of electrolytes, such as bananas, oranges, potatoes, tomatoes, and spinach.  Keep all follow-up visits as told by your health care provider. This is important. Contact a health care provider if:  You have abdominal pain that:  Gets worse.  Stays in one area (localizes).  You have a rash.  You have a stiff neck.  You are more irritable than usual.  You are sleepier or more difficult to wake up than usual.  You feel weak or dizzy.  You feel very thirsty.  You have urinated only a small amount of very dark urine over 6-8 hours. Get help right away if:  You have symptoms of severe dehydration.  You cannot drink fluids without vomiting.  Your symptoms get worse with treatment.  You have a fever.  You have a severe headache.  You have vomiting or diarrhea that:  Gets worse.  Does not go away.  You have blood or green matter  (bile) in your vomit.  You have blood in your stool. This may cause stool to look black and tarry.  You have not urinated in 6-8 hours.  You faint.  Your heart rate while sitting still is over 100 beats a minute.  You have trouble breathing. This information is not intended to replace advice given to you by your health care provider. Make sure you discuss any questions you have with your health care provider. Document Released: 04/28/2005 Document Revised: 11/23/2015 Document Reviewed: 06/22/2015 Elsevier Interactive Patient Education  2017 Elsevier Inc.  

## 2016-07-02 ENCOUNTER — Ambulatory Visit (HOSPITAL_BASED_OUTPATIENT_CLINIC_OR_DEPARTMENT_OTHER): Payer: BLUE CROSS/BLUE SHIELD

## 2016-07-02 VITALS — BP 132/85 | HR 64 | Temp 97.8°F | Resp 18

## 2016-07-02 DIAGNOSIS — E860 Dehydration: Secondary | ICD-10-CM

## 2016-07-02 DIAGNOSIS — C099 Malignant neoplasm of tonsil, unspecified: Secondary | ICD-10-CM

## 2016-07-02 MED ORDER — SODIUM CHLORIDE 0.9 % IV SOLN
Freq: Once | INTRAVENOUS | Status: AC
  Administered 2016-07-02: 13:00:00 via INTRAVENOUS

## 2016-07-02 MED ORDER — HEPARIN SOD (PORK) LOCK FLUSH 100 UNIT/ML IV SOLN
250.0000 [IU] | Freq: Once | INTRAVENOUS | Status: AC | PRN
  Administered 2016-07-02: 500 [IU]
  Filled 2016-07-02: qty 5

## 2016-07-02 MED ORDER — SODIUM CHLORIDE 0.9% FLUSH
10.0000 mL | INTRAVENOUS | Status: DC | PRN
  Administered 2016-07-02: 10 mL
  Filled 2016-07-02: qty 10

## 2016-07-02 NOTE — Patient Instructions (Signed)
Dehydration, Adult Dehydration is a condition in which there is not enough fluid or water in the body. This happens when you lose more fluids than you take in. Important organs, such as the kidneys, brain, and heart, cannot function without a proper amount of fluids. Any loss of fluids from the body can lead to dehydration. Dehydration can range from mild to severe. This condition should be treated right away to prevent it from becoming severe. What are the causes? This condition may be caused by:  Vomiting.  Diarrhea.  Excessive sweating, such as from heat exposure or exercise.  Not drinking enough fluid, especially:  When ill.  While doing activity that requires a lot of energy.  Excessive urination.  Fever.  Infection.  Certain medicines, such as medicines that cause the body to lose excess fluid (diuretics).  Inability to access safe drinking water.  Reduced physical ability to get adequate water and food. What increases the risk? This condition is more likely to develop in people:  Who have a poorly controlled long-term (chronic) illness, such as diabetes, heart disease, or kidney disease.  Who are age 65 or older.  Who are disabled.  Who live in a place with high altitude.  Who play endurance sports. What are the signs or symptoms? Symptoms of mild dehydration may include:   Thirst.  Dry lips.  Slightly dry mouth.  Dry, warm skin.  Dizziness. Symptoms of moderate dehydration may include:   Very dry mouth.  Muscle cramps.  Dark urine. Urine may be the color of tea.  Decreased urine production.  Decreased tear production.  Heartbeat that is irregular or faster than normal (palpitations).  Headache.  Light-headedness, especially when you stand up from a sitting position.  Fainting (syncope). Symptoms of severe dehydration may include:   Changes in skin, such as:  Cold and clammy skin.  Blotchy (mottled) or pale skin.  Skin that does  not quickly return to normal after being lightly pinched and released (poor skin turgor).  Changes in body fluids, such as:  Extreme thirst.  No tear production.  Inability to sweat when body temperature is high, such as in hot weather.  Very little urine production.  Changes in vital signs, such as:  Weak pulse.  Pulse that is more than 100 beats a minute when sitting still.  Rapid breathing.  Low blood pressure.  Other changes, such as:  Sunken eyes.  Cold hands and feet.  Confusion.  Lack of energy (lethargy).  Difficulty waking up from sleep.  Short-term weight loss.  Unconsciousness. How is this diagnosed? This condition is diagnosed based on your symptoms and a physical exam. Blood and urine tests may be done to help confirm the diagnosis. How is this treated? Treatment for this condition depends on the severity. Mild or moderate dehydration can often be treated at home. Treatment should be started right away. Do not wait until dehydration becomes severe. Severe dehydration is an emergency and it needs to be treated in a hospital. Treatment for mild dehydration may include:   Drinking more fluids.  Replacing salts and minerals in your blood (electrolytes) that you may have lost. Treatment for moderate dehydration may include:   Drinking an oral rehydration solution (ORS). This is a drink that helps you replace fluids and electrolytes (rehydrate). It can be found at pharmacies and retail stores. Treatment for severe dehydration may include:   Receiving fluids through an IV tube.  Receiving an electrolyte solution through a feeding tube that is   passed through your nose and into your stomach (nasogastric tube, or NG tube).  Correcting any abnormalities in electrolytes.  Treating the underlying cause of dehydration. Follow these instructions at home:  If directed by your health care provider, drink an ORS:  Make an ORS by following instructions on the  package.  Start by drinking small amounts, about  cup (120 mL) every 5-10 minutes.  Slowly increase how much you drink until you have taken the amount recommended by your health care provider.  Drink enough clear fluid to keep your urine clear or pale yellow. If you were told to drink an ORS, finish the ORS first, then start slowly drinking other clear fluids. Drink fluids such as:  Water. Do not drink only water. Doing that can lead to having too little salt (sodium) in the body (hyponatremia).  Ice chips.  Fruit juice that you have added water to (diluted fruit juice).  Low-calorie sports drinks.  Avoid:  Alcohol.  Drinks that contain a lot of sugar. These include high-calorie sports drinks, fruit juice that is not diluted, and soda.  Caffeine.  Foods that are greasy or contain a lot of fat or sugar.  Take over-the-counter and prescription medicines only as told by your health care provider.  Do not take sodium tablets. This can lead to having too much sodium in the body (hypernatremia).  Eat foods that contain a healthy balance of electrolytes, such as bananas, oranges, potatoes, tomatoes, and spinach.  Keep all follow-up visits as told by your health care provider. This is important. Contact a health care provider if:  You have abdominal pain that:  Gets worse.  Stays in one area (localizes).  You have a rash.  You have a stiff neck.  You are more irritable than usual.  You are sleepier or more difficult to wake up than usual.  You feel weak or dizzy.  You feel very thirsty.  You have urinated only a small amount of very dark urine over 6-8 hours. Get help right away if:  You have symptoms of severe dehydration.  You cannot drink fluids without vomiting.  Your symptoms get worse with treatment.  You have a fever.  You have a severe headache.  You have vomiting or diarrhea that:  Gets worse.  Does not go away.  You have blood or green matter  (bile) in your vomit.  You have blood in your stool. This may cause stool to look black and tarry.  You have not urinated in 6-8 hours.  You faint.  Your heart rate while sitting still is over 100 beats a minute.  You have trouble breathing. This information is not intended to replace advice given to you by your health care provider. Make sure you discuss any questions you have with your health care provider. Document Released: 04/28/2005 Document Revised: 11/23/2015 Document Reviewed: 06/22/2015 Elsevier Interactive Patient Education  2017 Elsevier Inc.  

## 2016-07-03 ENCOUNTER — Ambulatory Visit: Payer: Self-pay

## 2016-07-05 ENCOUNTER — Ambulatory Visit (HOSPITAL_BASED_OUTPATIENT_CLINIC_OR_DEPARTMENT_OTHER): Payer: BLUE CROSS/BLUE SHIELD

## 2016-07-05 VITALS — BP 118/80 | HR 66 | Temp 97.1°F | Resp 18

## 2016-07-05 DIAGNOSIS — C801 Malignant (primary) neoplasm, unspecified: Secondary | ICD-10-CM | POA: Diagnosis not present

## 2016-07-05 DIAGNOSIS — C099 Malignant neoplasm of tonsil, unspecified: Secondary | ICD-10-CM | POA: Diagnosis not present

## 2016-07-05 DIAGNOSIS — C029 Malignant neoplasm of tongue, unspecified: Secondary | ICD-10-CM | POA: Diagnosis not present

## 2016-07-05 MED ORDER — SODIUM CHLORIDE 0.9 % IV SOLN
Freq: Once | INTRAVENOUS | Status: AC
  Administered 2016-07-05: 09:00:00 via INTRAVENOUS

## 2016-07-05 MED ORDER — HEPARIN SOD (PORK) LOCK FLUSH 100 UNIT/ML IV SOLN
500.0000 [IU] | Freq: Once | INTRAVENOUS | Status: AC | PRN
  Administered 2016-07-05: 500 [IU]
  Filled 2016-07-05: qty 5

## 2016-07-05 MED ORDER — SODIUM CHLORIDE 0.9% FLUSH
10.0000 mL | INTRAVENOUS | Status: DC | PRN
  Administered 2016-07-05: 10 mL
  Filled 2016-07-05: qty 10

## 2016-07-05 NOTE — Patient Instructions (Signed)
Dehydration, Adult Dehydration is when there is not enough fluid or water in your body. This happens when you lose more fluids than you take in. Dehydration can range from mild to very bad. It should be treated right away to keep it from getting very bad. Symptoms of mild dehydration may include:   Thirst.  Dry lips.  Slightly dry mouth.  Dry, warm skin.  Dizziness. Symptoms of moderate dehydration may include:   Very dry mouth.  Muscle cramps.  Dark pee (urine). Pee may be the color of tea.  Your body making less pee.  Your eyes making fewer tears.  Heartbeat that is uneven or faster than normal (palpitations).  Headache.  Light-headedness, especially when you stand up from sitting.  Fainting (syncope). Symptoms of very bad dehydration may include:   Changes in skin, such as:  Cold and clammy skin.  Blotchy (mottled) or pale skin.  Skin that does not quickly return to normal after being lightly pinched and let go (poor skin turgor).  Changes in body fluids, such as:  Feeling very thirsty.  Your eyes making fewer tears.  Not sweating when body temperature is high, such as in hot weather.  Your body making very little pee.  Changes in vital signs, such as:  Weak pulse.  Pulse that is more than 100 beats a minute when you are sitting still.  Fast breathing.  Low blood pressure.  Other changes, such as:  Sunken eyes.  Cold hands and feet.  Confusion.  Lack of energy (lethargy).  Trouble waking up from sleep.  Short-term weight loss.  Unconsciousness. Follow these instructions at home:  If told by your doctor, drink an ORS:  Make an ORS by using instructions on the package.  Start by drinking small amounts, about  cup (120 mL) every 5-10 minutes.  Slowly drink more until you have had the amount that your doctor said to have.  Drink enough clear fluid to keep your pee clear or pale yellow. If you were told to drink an ORS, finish the  ORS first, then start slowly drinking clear fluids. Drink fluids such as:  Water. Do not drink only water by itself. Doing that can make the salt (sodium) level in your body get too low (hyponatremia).  Ice chips.  Fruit juice that you have added water to (diluted).  Low-calorie sports drinks.  Avoid:  Alcohol.  Drinks that have a lot of sugar. These include high-calorie sports drinks, fruit juice that does not have water added, and soda.  Caffeine.  Foods that are greasy or have a lot of fat or sugar.  Take over-the-counter and prescription medicines only as told by your doctor.  Do not take salt tablets. Doing that can make the salt level in your body get too high (hypernatremia).  Eat foods that have minerals (electrolytes). Examples include bananas, oranges, potatoes, tomatoes, and spinach.  Keep all follow-up visits as told by your doctor. This is important. Contact a doctor if:  You have belly (abdominal) pain that:  Gets worse.  Stays in one area (localizes).  You have a rash.  You have a stiff neck.  You get angry or annoyed more easily than normal (irritability).  You are more sleepy than normal.  You have a harder time waking up than normal.  You feel:  Weak.  Dizzy.  Very thirsty.  You have peed (urinated) only a small amount of very dark pee during 6-8 hours. Get help right away if:  You   have symptoms of very bad dehydration.  You cannot drink fluids without throwing up (vomiting).  Your symptoms get worse with treatment.  You have a fever.  You have a very bad headache.  You are throwing up or having watery poop (diarrhea) and it:  Gets worse.  Does not go away.  You have blood or something green (bile) in your throw-up.  You have blood in your poop (stool). This may cause poop to look black and tarry.  You have not peed in 6-8 hours.  You pass out (faint).  Your heart rate when you are sitting still is more than 100 beats a  minute.  You have trouble breathing. This information is not intended to replace advice given to you by your health care provider. Make sure you discuss any questions you have with your health care provider. Document Released: 02/22/2009 Document Revised: 11/16/2015 Document Reviewed: 06/22/2015 Elsevier Interactive Patient Education  2017 Elsevier Inc.  

## 2016-07-07 MED FILL — METOCLOPRAMIDE 5 MG/5 ML SY: 5 | 6 days supply | Qty: 240 | Fill #5

## 2016-07-08 ENCOUNTER — Ambulatory Visit: Payer: BLUE CROSS/BLUE SHIELD

## 2016-07-08 ENCOUNTER — Ambulatory Visit (HOSPITAL_BASED_OUTPATIENT_CLINIC_OR_DEPARTMENT_OTHER): Payer: BLUE CROSS/BLUE SHIELD | Admitting: Nurse Practitioner

## 2016-07-08 ENCOUNTER — Ambulatory Visit: Payer: BLUE CROSS/BLUE SHIELD | Admitting: Nutrition

## 2016-07-08 ENCOUNTER — Encounter: Payer: Self-pay | Admitting: Hematology and Oncology

## 2016-07-08 ENCOUNTER — Ambulatory Visit (HOSPITAL_BASED_OUTPATIENT_CLINIC_OR_DEPARTMENT_OTHER): Payer: BLUE CROSS/BLUE SHIELD | Admitting: Hematology and Oncology

## 2016-07-08 VITALS — BP 120/73 | Temp 98.0°F | Resp 18 | Ht 70.0 in | Wt 167.6 lb

## 2016-07-08 DIAGNOSIS — R1310 Dysphagia, unspecified: Secondary | ICD-10-CM

## 2016-07-08 DIAGNOSIS — C099 Malignant neoplasm of tonsil, unspecified: Secondary | ICD-10-CM

## 2016-07-08 DIAGNOSIS — R042 Hemoptysis: Secondary | ICD-10-CM

## 2016-07-08 DIAGNOSIS — E860 Dehydration: Secondary | ICD-10-CM | POA: Diagnosis not present

## 2016-07-08 MED ORDER — SODIUM CHLORIDE 0.9 % IV SOLN
Freq: Once | INTRAVENOUS | Status: AC
  Administered 2016-07-08: 10:00:00 via INTRAVENOUS

## 2016-07-08 MED ORDER — SODIUM CHLORIDE 0.9% FLUSH
10.0000 mL | Freq: Once | INTRAVENOUS | Status: AC
  Administered 2016-07-08: 10 mL
  Filled 2016-07-08: qty 10

## 2016-07-08 MED ORDER — SODIUM CHLORIDE 0.9% FLUSH
10.0000 mL | INTRAVENOUS | Status: DC | PRN
  Administered 2016-07-08: 10 mL
  Filled 2016-07-08: qty 10

## 2016-07-08 MED ORDER — HEPARIN SOD (PORK) LOCK FLUSH 100 UNIT/ML IV SOLN
500.0000 [IU] | Freq: Once | INTRAVENOUS | Status: AC | PRN
  Administered 2016-07-08: 500 [IU]
  Filled 2016-07-08: qty 5

## 2016-07-08 NOTE — Assessment & Plan Note (Signed)
He has occasional hemoptysis when he tries to clear his throat.  I think it could be related to friable mucosa from recent radiation.  It is only minor.  I recommend observation only for now

## 2016-07-08 NOTE — Assessment & Plan Note (Signed)
He has completed all chemotherapy and radiation therapy He is recovering well from side effects of treatment. He felt that IV fluids had to help him. He is gaining weight. I recommend continue IV fluids for now His next imaging study is not due until May 2018

## 2016-07-08 NOTE — Progress Notes (Signed)
[facility] OFFICE PROGRESS NOTE  Patient Care Team: Haywood Pao, MD as PCP - General (Internal Medicine) Leota Sauers, RN as Oncology Nurse Navigator Eppie Gibson, MD as Attending Physician (Radiation Oncology) Heath Lark, MD as Consulting Physician (Hematology and Oncology) Karie Mainland, RD as Dietitian (Nutrition)  SUMMARY OF ONCOLOGIC HISTORY:   Tonsil cancer ([place])   02/07/2016 Imaging    Ct neck showed abnormal soft tissue within the left pharynx, extending from the left tonsillar pillar inferiorly to the level of the hyoid and filling the left vallecula. The appearance is most consistent with a neoplastic process. 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 Histologic sampling and direct visualization should be considered. Confluent adenopathy within the left cervical chain with areas of central hypoattenuation suggesting degrees of necrosis. This is most suggestive of metastatic spread. The areas of necrosis within the adenopathy combined with the above-described pharyngeal soft tissue mass are together suggestive of squamous cell carcinoma.      02/14/2016 Procedure    He has FNA of left neck LN       02/14/2016 Pathology Results    [id number] cytology confirmed squamous cell carcinoma      02/26/2016 PET scan    Hypermetabolic left tonsillar in pharyngeal mass with hypermetabolic left sided station IA, station III, and station Ib adenopathy and hypermetabolic station IIa adenopathy on the right. Borderline hypermetabolic small right station IIb lymph node. Several tiny lung nodules are observed, these are not hypermetabolic but are also below the threshold level for accurate characterization. Dedicated CT chest may be warranted to act as a baseline.      03/20/2016 Procedure    Placement of a right internal jugular approach power injectable Port-A-Cath. Fluoroscopic insertion of a 20-French pull-through gastrostomy tube.       03/24/2016 - 05/06/2016 Chemotherapy    He received high dose cisplatin; dose 2 and 3 with dose reduction due to side-effects      03/24/2016 - 05/15/2016 Radiation Therapy    He received concurrent radiation       INTERVAL HISTORY: Please see below for problem oriented charting. He returns for further follow-up. He continues to struggle with dysphagia.  He is not able to watch. He is dependent on his feeding tube.  He is gaining weight.   He complained of severe dry mouth but the IV fluids is helping him.  He denies pain, nausea or changes in bowel habits  REVIEW OF SYSTEMS:   Constitutional: Denies fevers, chills or abnormal weight loss Eyes: Denies blurriness of vision Ears, nose, mouth, throat, and face: Denies mucositis or sore throat Respiratory: Denies cough, dyspnea or wheezes Cardiovascular: Denies palpitation, chest discomfort or lower extremity swelling Gastrointestinal:  Denies nausea, heartburn or change in bowel habits Skin: Denies abnormal skin rashes Lymphatics: Denies new lymphadenopathy or easy bruising Neurological:Denies numbness, tingling or new weaknesses Behavioral/Psych: Mood is stable, no new changes  All other systems were reviewed with the patient and are negative.  I have reviewed the past medical history, past surgical history, social history and family history with the patient and they are unchanged from previous note.  ALLERGIES:  is allergic to phenergan [promethazine hcl].  MEDICATIONS:  Current Outpatient Prescriptions  Medication Sig Dispense Refill  . fenofibrate (TRICOR) 145 MG tablet Take 145 mg by mouth daily.     [name] levothyroxine (SYNTHROID, LEVOTHROID) 50 MCG tablet Take 50 mcg by mouth daily before breakfast.     . lidocaine (XYLOCAINE) 2 %  solution Patient: Mix 1part 2% viscous lidocaine, 1part H20. Swish/swallow 22mL of this mixture, 59min before meals and at bedtime, up to QID 200 mL 3  . lidocaine-prilocaine (EMLA) cream Apply to  affected area once 30 g 3  . metoCLOPramide (REGLAN) 5 MG/5ML solution Take 10 mLs (10 mg total) by mouth 4 (four) times daily -  before meals and at bedtime. 240 mL 11  . Nutritional Supplements (FEEDING SUPPLEMENT, OSMOLITE 1.5 CAL,) LIQD Run Osmolite 1.5 at 100 ML/hr for 12 hours via PEG. Give one can BID daily with 120 cc free water before and after bolus feedings and continuous feeding. 1659 mL 0  . sodium fluoride (FLUORISHIELD) 1.1 % GEL dental gel Instill one drop of gel per tooth space of fluoride tray. Place over teeth for 5 minutes. Remove. Spit out excess. Repeat nightly. 120 mL prn  . lactulose (CHRONULAC) 10 GM/15ML solution Place 15 mLs (10 g total) into feeding tube 3 (three) times daily. (Patient not taking: Reported on 07/08/2016) 240 mL 0  . ondansetron (ZOFRAN) 8 MG tablet Take 1 tablet (8 mg total) by mouth 2 (two) times daily as needed. Start on the third day after chemotherapy. (Patient not taking: Reported on 05/30/2016) 30 tablet 1  . polyethylene glycol (MIRALAX) packet Take 17 g by mouth daily. (Patient not taking: Reported on 05/30/2016) 14 each 0  . prochlorperazine (COMPAZINE) 10 MG tablet Take 1 tablet (10 mg total) by mouth every 6 (six) hours as needed (Nausea or vomiting). (Patient not taking: Reported on 05/30/2016) 30 tablet 1   No current facility-administered medications for this visit.     PHYSICAL EXAMINATION: ECOG PERFORMANCE STATUS: 0 - Asymptomatic  Vitals:   07/08/16 0934  BP: 120/73  Pulse: 64  Resp: 18  Temp: 98 F (36.7 C)   Filed Weights   07/08/16 0934  Weight: 167 lb 6.4 oz (75.9 kg)    GENERAL:alert, no distress and comfortable SKIN: skin color, texture, turgor are normal, no rashes or significant lesions EYES: normal, Conjunctiva are pink and non-injected, sclera clear OROPHARYNX:no exudate, no erythema and lips, buccal mucosa, and tongue normal  NECK: supple, thyroid normal size, non-tender, without nodularity LYMPH:  no palpable  lymphadenopathy in the cervical, axillary or inguinal LUNGS: clear to auscultation and percussion with normal breathing effort HEART: regular rate & rhythm and no murmurs and no lower extremity edema ABDOMEN:abdomen soft, non-tender and normal bowel sounds Musculoskeletal:no cyanosis of digits and no clubbing  NEURO: alert & oriented x 3 with fluent speech, no focal motor/sensory deficits  LABORATORY DATA:  I have reviewed the data as listed    Component Value Date/Time   NA 141 06/10/2016 1149   K 4.2 06/10/2016 1149   CL 96 (L) 05/11/2016 1608   CO2 30 (H) 06/10/2016 1149   GLUCOSE 80 06/10/2016 1149   BUN 25.1 06/10/2016 1149   CREATININE 0.9 06/10/2016 1149   CALCIUM 10.0 06/10/2016 1149   PROT 6.8 06/10/2016 1149   ALBUMIN 3.6 06/10/2016 1149   AST 13 06/10/2016 1149   ALT 16 06/10/2016 1149   ALKPHOS 49 06/10/2016 1149   BILITOT 0.32 06/10/2016 1149   GFRNONAA >60 05/11/2016 1608   GFRAA >60 05/11/2016 1608    No results found for: SPEP, UPEP  Lab Results  Component Value Date   WBC 4.2 06/10/2016   NEUTROABS 3.0 06/10/2016   HGB 9.9 (L) 06/10/2016   HCT 29.5 (L) 06/10/2016   MCV 87.4 06/10/2016   PLT 380 06/10/2016  Chemistry      Component Value Date/Time   NA 141 06/10/2016 1149   K 4.2 06/10/2016 1149   CL 96 (L) 05/11/2016 1608   CO2 30 (H) 06/10/2016 1149   BUN 25.1 06/10/2016 1149   CREATININE 0.9 06/10/2016 1149      Component Value Date/Time   CALCIUM 10.0 06/10/2016 1149   ALKPHOS 49 06/10/2016 1149   AST 13 06/10/2016 1149   ALT 16 06/10/2016 1149   BILITOT 0.32 06/10/2016 1149       ASSESSMENT & PLAN:  Tonsil cancer ([place]) He has completed all chemotherapy and radiation therapy He is recovering well from side effects of treatment. He felt that IV fluids had to help him. He is gaining weight. I recommend continue IV fluids for now His next imaging study is not due until May 2018  Dysphagia He complained of persistent  dysphagia. It is mildly improving. He has return appointment to see speech and language therapist tomorrow for further assessment and evaluation.  Hemoptysis He has occasional hemoptysis when he tries to clear his throat.  I think it could be related to friable mucosa from recent radiation.  It is only minor.  I recommend observation only for now   No orders of the defined types were placed in this encounter.  All questions were answered. The patient knows to call the clinic with any problems, questions or concerns. No barriers to learning was detected. I spent 15 minutes counseling the patient face to face. The total time spent in the appointment was 20 minutes and more than 50% was on counseling and review of test results     Heath Lark, MD 07/08/2016 10:00 AM

## 2016-07-08 NOTE — Patient Instructions (Signed)
Dehydration, Adult Dehydration is a condition in which there is not enough fluid or water in the body. This happens when you lose more fluids than you take in. Important organs, such as the kidneys, brain, and heart, cannot function without a proper amount of fluids. Any loss of fluids from the body can lead to dehydration. Dehydration can range from mild to severe. This condition should be treated right away to prevent it from becoming severe. What are the causes? This condition may be caused by:  Vomiting.  Diarrhea.  Excessive sweating, such as from heat exposure or exercise.  Not drinking enough fluid, especially:  When ill.  While doing activity that requires a lot of energy.  Excessive urination.  Fever.  Infection.  Certain medicines, such as medicines that cause the body to lose excess fluid (diuretics).  Inability to access safe drinking water.  Reduced physical ability to get adequate water and food. What increases the risk? This condition is more likely to develop in people:  Who have a poorly controlled long-term (chronic) illness, such as diabetes, heart disease, or kidney disease.  Who are age 65 or older.  Who are disabled.  Who live in a place with high altitude.  Who play endurance sports. What are the signs or symptoms? Symptoms of mild dehydration may include:   Thirst.  Dry lips.  Slightly dry mouth.  Dry, warm skin.  Dizziness. Symptoms of moderate dehydration may include:   Very dry mouth.  Muscle cramps.  Dark urine. Urine may be the color of tea.  Decreased urine production.  Decreased tear production.  Heartbeat that is irregular or faster than normal (palpitations).  Headache.  Light-headedness, especially when you stand up from a sitting position.  Fainting (syncope). Symptoms of severe dehydration may include:   Changes in skin, such as:  Cold and clammy skin.  Blotchy (mottled) or pale skin.  Skin that does  not quickly return to normal after being lightly pinched and released (poor skin turgor).  Changes in body fluids, such as:  Extreme thirst.  No tear production.  Inability to sweat when body temperature is high, such as in hot weather.  Very little urine production.  Changes in vital signs, such as:  Weak pulse.  Pulse that is more than 100 beats a minute when sitting still.  Rapid breathing.  Low blood pressure.  Other changes, such as:  Sunken eyes.  Cold hands and feet.  Confusion.  Lack of energy (lethargy).  Difficulty waking up from sleep.  Short-term weight loss.  Unconsciousness. How is this diagnosed? This condition is diagnosed based on your symptoms and a physical exam. Blood and urine tests may be done to help confirm the diagnosis. How is this treated? Treatment for this condition depends on the severity. Mild or moderate dehydration can often be treated at home. Treatment should be started right away. Do not wait until dehydration becomes severe. Severe dehydration is an emergency and it needs to be treated in a hospital. Treatment for mild dehydration may include:   Drinking more fluids.  Replacing salts and minerals in your blood (electrolytes) that you may have lost. Treatment for moderate dehydration may include:   Drinking an oral rehydration solution (ORS). This is a drink that helps you replace fluids and electrolytes (rehydrate). It can be found at pharmacies and retail stores. Treatment for severe dehydration may include:   Receiving fluids through an IV tube.  Receiving an electrolyte solution through a feeding tube that is   passed through your nose and into your stomach (nasogastric tube, or NG tube).  Correcting any abnormalities in electrolytes.  Treating the underlying cause of dehydration. Follow these instructions at home:  If directed by your health care provider, drink an ORS:  Make an ORS by following instructions on the  package.  Start by drinking small amounts, about  cup (120 mL) every 5-10 minutes.  Slowly increase how much you drink until you have taken the amount recommended by your health care provider.  Drink enough clear fluid to keep your urine clear or pale yellow. If you were told to drink an ORS, finish the ORS first, then start slowly drinking other clear fluids. Drink fluids such as:  Water. Do not drink only water. Doing that can lead to having too little salt (sodium) in the body (hyponatremia).  Ice chips.  Fruit juice that you have added water to (diluted fruit juice).  Low-calorie sports drinks.  Avoid:  Alcohol.  Drinks that contain a lot of sugar. These include high-calorie sports drinks, fruit juice that is not diluted, and soda.  Caffeine.  Foods that are greasy or contain a lot of fat or sugar.  Take over-the-counter and prescription medicines only as told by your health care provider.  Do not take sodium tablets. This can lead to having too much sodium in the body (hypernatremia).  Eat foods that contain a healthy balance of electrolytes, such as bananas, oranges, potatoes, tomatoes, and spinach.  Keep all follow-up visits as told by your health care provider. This is important. Contact a health care provider if:  You have abdominal pain that:  Gets worse.  Stays in one area (localizes).  You have a rash.  You have a stiff neck.  You are more irritable than usual.  You are sleepier or more difficult to wake up than usual.  You feel weak or dizzy.  You feel very thirsty.  You have urinated only a small amount of very dark urine over 6-8 hours. Get help right away if:  You have symptoms of severe dehydration.  You cannot drink fluids without vomiting.  Your symptoms get worse with treatment.  You have a fever.  You have a severe headache.  You have vomiting or diarrhea that:  Gets worse.  Does not go away.  You have blood or green matter  (bile) in your vomit.  You have blood in your stool. This may cause stool to look black and tarry.  You have not urinated in 6-8 hours.  You faint.  Your heart rate while sitting still is over 100 beats a minute.  You have trouble breathing. This information is not intended to replace advice given to you by your health care provider. Make sure you discuss any questions you have with your health care provider. Document Released: 04/28/2005 Document Revised: 11/23/2015 Document Reviewed: 06/22/2015 Elsevier Interactive Patient Education  2017 Elsevier Inc.  

## 2016-07-08 NOTE — Assessment & Plan Note (Signed)
He complained of persistent dysphagia. It is mildly improving. He has return appointment to see speech and language therapist tomorrow for further assessment and evaluation.

## 2016-07-08 NOTE — Progress Notes (Signed)
Patient left before I could follow up with him. Weight continues to improve and was documented at 167.6 pounds. Patient is tolerating TF without difficulty per nursing. He has more energy and is feeling better. I will follow up with him during IVF on March 13.

## 2016-07-08 NOTE — Progress Notes (Signed)
RN visit only for IVF 

## 2016-07-09 ENCOUNTER — Ambulatory Visit: Payer: BLUE CROSS/BLUE SHIELD | Attending: Hematology and Oncology

## 2016-07-09 DIAGNOSIS — R1310 Dysphagia, unspecified: Secondary | ICD-10-CM | POA: Insufficient documentation

## 2016-07-09 NOTE — Therapy (Signed)
[facility] [address] [place], [place], [address] Phone: [phone]   Fax:  [phone]  Speech Language Pathology Treatment  Patient Details  Name: Todd [name] MRN: [id number] Date of Birth: 14-Feb-1961 Referring Provider: Eppie Gibson MD  Encounter Date: 07/09/2016      End of Session - 07/09/16 1356    Visit Number 3   Number of Visits 6   Date for SLP Re-Evaluation 09/05/16   SLP Start Time 0932   SLP Stop Time  1015   SLP Time Calculation (min) 43 min   Activity Tolerance Patient tolerated treatment well      Past Medical History:  Diagnosis Date  . Arthritis   . Cancer ([facility] [place])    Tonsil Cancer  . History of radiation therapy 03/24/2016- 05/15/2016   Left Tonsil and Bilateral Neck  . Hyperlipidemia   . Hypertension   . Hypothyroidism     Past Surgical History:  Procedure Laterality Date  . IR GENERIC HISTORICAL  03/20/2016   IR US GUIDE VASC ACCESS RIGHT 03/20/2016 Sandi Mariscal, MD [facility]  . IR GENERIC HISTORICAL  03/20/2016   IR FLUORO GUIDE PORT INSERTION RIGHT 03/20/2016 Sandi Mariscal, MD [facility]  . IR GENERIC HISTORICAL  03/20/2016   IR GASTROSTOMY TUBE MOD SED 03/20/2016 Sandi Mariscal, MD [facility]    There were no vitals filed for this visit.      Subjective Assessment - 07/09/16 0941    Subjective Pt has completed HEP approx 3 days a week, once-twice per day.   Currently in Pain? No/denies               ADULT SLP TREATMENT - 07/09/16 0959      General Information   Behavior/Cognition Alert;Cooperative;Pleasant mood     Treatment Provided   Treatment provided Dysphagia     Dysphagia Treatment   Temperature Spikes Noted No   Respiratory Status Room air   Oral Cavity - Dentition Adequate natural dentition   Treatment Methods Therapeutic exercise;Upgraded PO texture trial;Skilled observation;Patient/caregiver education   Patient observed directly with PO's Yes   Type  of PO's observed Thin liquids   Liquids provided via Cup   Oral Phase Signs & Symptoms --  none suspected   Pharyngeal Phase Signs & Symptoms Delayed throat clear  none during consecutive swallows; approx 5-6 sec after bolus   Other treatment/comments SLP assessed and targeted pt's swallowing ability by having him drink thick liquids with results as noted above. Minimal wet voice noted with first bolus. After SLP told pt to swallow with effort, voice was clear immediately following bolus of thin with multiple sips, less throat clearing afterwards, SLP postulates that pt is getting more muscle movement in order to pull UES open wider , and for longer period of time to better clear liquid from pharynx.  Education provided (see "education"). SLP suggested pt use Boost/Ensure/high calorie drink for PO, in order to consume something to substitute for bolus feed. Also suggested pt alternate bite-sip should he attempt dys I items. All swallows PO hsould be with effortful swallow.     Assessment / Recommendations / Plan   Plan Continue with current plan of care     Dysphagia Recommendations   Diet recommendations Thin liquid;Dysphagia 2 (fine chop)   Liquids provided via Cup   Medication Administration --  as tolerated   Compensations Effortful swallow;Follow solids with liquid;Small sips/bites     Progression Toward Goals   Progression toward goals  Progressing toward goals          SLP Education - 07/09/16 1351    Education provided Yes   Education Details HEP, alternate bite-sip iwht solid POs, effortful swallow 100% with POs   Person(s) Educated Patient   Methods Explanation;Demonstration;Verbal cues;Handout   Comprehension Verbalized understanding;Verbal cues required;Need further instruction;Returned demonstration          SLP Short Term Goals - 07/09/16 1358      SLP SHORT TERM GOAL #1   Title pt will complete HEP with rare min A   Time 1   Period --  visits   Status On-going      SLP SHORT TERM GOAL #2   Title pt will demo knowledge of 3 overt s/s aspiration PNA with modified indpendnece   Time 1   Period --  visits   Status On-going     SLP SHORT TERM GOAL #3   Title pt will tell SLP how a food journal can assist in return to more normal PO diet   Time 2   Period --  visits   Status On-going          SLP Long Term Goals - 07/09/16 1359      SLP LONG TERM GOAL #1   Title pt will complete HEP with modified indpendence over 2 therapy visits   Time 3   Period --  visits   Status On-going     SLP LONG TERM GOAL #2   Title pt will tell SLP when to switch frequency of HEP from 2-3/day to x2/week   Time 3   Period --  visits   Status On-going          Plan - 07/09/16 1356    Clinical Impression Statement SLP continues to suspect some 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 Pt has been mildly more compliant with HEP but only completes approx 3x/week. SLP again educated pt on rationale for HEP and again the need for pt to comply with SLP-reommended variety, frequency, and number reps for HEP, in order to experience improved swallow function. See "other treatment/comments" and "education" above for more details. Pt would cont to benefit from skilled ST on goals noted in this summary to improve swallow function and safety and mitigate danger of aspiration/aspiration PNA.   Speech Therapy Frequency --  once approx every 4 weeks   Duration --  4 visits   Treatment/Interventions Aspiration precaution training;Pharyngeal strengthening exercises;Compensatory techniques;SLP instruction and feedback;Patient/family education;Trials of upgraded texture/liquids  any and/or all may be used in ST sessions   Potential to Achieve Goals Good      Patient will benefit from skilled therapeutic intervention in order to improve the following deficits and impairments:   Dysphagia, unspecified type    Problem List Patient  Active Problem List   Diagnosis Date Noted  . Dysphagia 06/13/2016  . Pancytopenia, acquired ([place]) 05/26/2016  . Protein-calorie malnutrition, moderate ([place]) 05/26/2016  . Deficiency anemia 05/26/2016  . Postprandial abdominal bloating 05/16/2016  . Redness of both eyes 05/16/2016  . Splenic lesion 05/16/2016  . Frequent epistaxis 04/28/2016  . Cough 04/21/2016  . Other constipation 04/21/2016  . Drug-induced neutropenia ([place]) 04/14/2016  . Anemia due to antineoplastic chemotherapy 04/14/2016  . Jaw pain 04/08/2016  . Chemotherapy-induced nausea 03/31/2016  . Bilateral tinnitus 03/31/2016  . Essential hypertension 03/18/2016  . Hemoptysis 02/26/2016  . Weight loss 02/26/2016  . Carcinoma of tonsillar fossa ([place]) 02/26/2016  .  Tonsil cancer ([place]) 02/21/2016    [facility] ,MS, CCC-SLP  07/09/2016, 1:59 PM  [facility] [address] [place], [place], [address] Phone: [phone]   Fax:  [phone]   Name: Danne Hemple MRN: [id number] Date of Birth: 12/01/1960

## 2016-07-10 ENCOUNTER — Telehealth: Payer: Self-pay | Admitting: Hematology and Oncology

## 2016-07-10 DIAGNOSIS — C029 Malignant neoplasm of tongue, unspecified: Secondary | ICD-10-CM | POA: Diagnosis not present

## 2016-07-10 NOTE — Telephone Encounter (Signed)
Appointments scheduled per 2/27 LOS. Patient notified. °

## 2016-07-12 ENCOUNTER — Ambulatory Visit (HOSPITAL_BASED_OUTPATIENT_CLINIC_OR_DEPARTMENT_OTHER): Payer: BLUE CROSS/BLUE SHIELD

## 2016-07-12 VITALS — BP 137/75 | HR 65 | Temp 97.7°F | Resp 18

## 2016-07-12 DIAGNOSIS — C099 Malignant neoplasm of tonsil, unspecified: Secondary | ICD-10-CM | POA: Diagnosis not present

## 2016-07-12 MED ORDER — SODIUM CHLORIDE 0.9% FLUSH
10.0000 mL | INTRAVENOUS | Status: DC | PRN
  Administered 2016-07-12: 10 mL
  Filled 2016-07-12: qty 10

## 2016-07-12 MED ORDER — HEPARIN SOD (PORK) LOCK FLUSH 100 UNIT/ML IV SOLN
500.0000 [IU] | Freq: Once | INTRAVENOUS | Status: AC | PRN
  Administered 2016-07-12: 500 [IU]
  Filled 2016-07-12: qty 5

## 2016-07-12 MED ORDER — SODIUM CHLORIDE 0.9 % IV SOLN
Freq: Once | INTRAVENOUS | Status: AC
  Administered 2016-07-12: 10:00:00 via INTRAVENOUS

## 2016-07-14 ENCOUNTER — Telehealth: Payer: Self-pay | Admitting: *Deleted

## 2016-07-14 NOTE — Telephone Encounter (Signed)
Faxed refill of osmolite to [facility]. 7 cans per day

## 2016-07-15 ENCOUNTER — Ambulatory Visit (HOSPITAL_BASED_OUTPATIENT_CLINIC_OR_DEPARTMENT_OTHER): Payer: BLUE CROSS/BLUE SHIELD | Admitting: Nurse Practitioner

## 2016-07-15 VITALS — BP 129/75 | HR 63 | Temp 98.2°F | Resp 18 | Ht 70.0 in | Wt 165.7 lb

## 2016-07-15 DIAGNOSIS — C099 Malignant neoplasm of tonsil, unspecified: Secondary | ICD-10-CM | POA: Diagnosis not present

## 2016-07-15 DIAGNOSIS — E860 Dehydration: Secondary | ICD-10-CM

## 2016-07-15 MED ORDER — HEPARIN SOD (PORK) LOCK FLUSH 100 UNIT/ML IV SOLN
500.0000 [IU] | Freq: Once | INTRAVENOUS | Status: AC | PRN
  Administered 2016-07-15: 500 [IU]
  Filled 2016-07-15: qty 5

## 2016-07-15 MED ORDER — SODIUM CHLORIDE 0.9% FLUSH
10.0000 mL | INTRAVENOUS | Status: DC | PRN
  Administered 2016-07-15: 10 mL
  Filled 2016-07-15: qty 10

## 2016-07-15 MED ORDER — SODIUM CHLORIDE 0.9 % IV SOLN
Freq: Once | INTRAVENOUS | Status: AC
  Administered 2016-07-15: 11:00:00 via INTRAVENOUS

## 2016-07-15 NOTE — Progress Notes (Signed)
Pt continues to improve his oral intake-drinking anywhere from 4-8 0z or more of juices daily with increasing variety. States he does best with orange juice, pink grapefruit juice, G2 gatorade, apple juice.  Sweeter juices seem to be more sensitive when swallowing.  Encouraged to drink more during the course of the day. As his oral intake improves , his need for 2x a week IV fluids will decrease.  RN visit only for IVF

## 2016-07-15 NOTE — Patient Instructions (Signed)
Dehydration, Adult Dehydration is a condition in which there is not enough fluid or water in the body. This happens when you lose more fluids than you take in. Important organs, such as the kidneys, brain, and heart, cannot function without a proper amount of fluids. Any loss of fluids from the body can lead to dehydration. Dehydration can range from mild to severe. This condition should be treated right away to prevent it from becoming severe. What are the causes? This condition may be caused by:  Vomiting.  Diarrhea.  Excessive sweating, such as from heat exposure or exercise.  Not drinking enough fluid, especially:  When ill.  While doing activity that requires a lot of energy.  Excessive urination.  Fever.  Infection.  Certain medicines, such as medicines that cause the body to lose excess fluid (diuretics).  Inability to access safe drinking water.  Reduced physical ability to get adequate water and food. What increases the risk? This condition is more likely to develop in people:  Who have a poorly controlled long-term (chronic) illness, such as diabetes, heart disease, or kidney disease.  Who are age 65 or older.  Who are disabled.  Who live in a place with high altitude.  Who play endurance sports. What are the signs or symptoms? Symptoms of mild dehydration may include:   Thirst.  Dry lips.  Slightly dry mouth.  Dry, warm skin.  Dizziness. Symptoms of moderate dehydration may include:   Very dry mouth.  Muscle cramps.  Dark urine. Urine may be the color of tea.  Decreased urine production.  Decreased tear production.  Heartbeat that is irregular or faster than normal (palpitations).  Headache.  Light-headedness, especially when you stand up from a sitting position.  Fainting (syncope). Symptoms of severe dehydration may include:   Changes in skin, such as:  Cold and clammy skin.  Blotchy (mottled) or pale skin.  Skin that does  not quickly return to normal after being lightly pinched and released (poor skin turgor).  Changes in body fluids, such as:  Extreme thirst.  No tear production.  Inability to sweat when body temperature is high, such as in hot weather.  Very little urine production.  Changes in vital signs, such as:  Weak pulse.  Pulse that is more than 100 beats a minute when sitting still.  Rapid breathing.  Low blood pressure.  Other changes, such as:  Sunken eyes.  Cold hands and feet.  Confusion.  Lack of energy (lethargy).  Difficulty waking up from sleep.  Short-term weight loss.  Unconsciousness. How is this diagnosed? This condition is diagnosed based on your symptoms and a physical exam. Blood and urine tests may be done to help confirm the diagnosis. How is this treated? Treatment for this condition depends on the severity. Mild or moderate dehydration can often be treated at home. Treatment should be started right away. Do not wait until dehydration becomes severe. Severe dehydration is an emergency and it needs to be treated in a hospital. Treatment for mild dehydration may include:   Drinking more fluids.  Replacing salts and minerals in your blood (electrolytes) that you may have lost. Treatment for moderate dehydration may include:   Drinking an oral rehydration solution (ORS). This is a drink that helps you replace fluids and electrolytes (rehydrate). It can be found at pharmacies and retail stores. Treatment for severe dehydration may include:   Receiving fluids through an IV tube.  Receiving an electrolyte solution through a feeding tube that is   passed through your nose and into your stomach (nasogastric tube, or NG tube).  Correcting any abnormalities in electrolytes.  Treating the underlying cause of dehydration. Follow these instructions at home:  If directed by your health care provider, drink an ORS:  Make an ORS by following instructions on the  package.  Start by drinking small amounts, about  cup (120 mL) every 5-10 minutes.  Slowly increase how much you drink until you have taken the amount recommended by your health care provider.  Drink enough clear fluid to keep your urine clear or pale yellow. If you were told to drink an ORS, finish the ORS first, then start slowly drinking other clear fluids. Drink fluids such as:  Water. Do not drink only water. Doing that can lead to having too little salt (sodium) in the body (hyponatremia).  Ice chips.  Fruit juice that you have added water to (diluted fruit juice).  Low-calorie sports drinks.  Avoid:  Alcohol.  Drinks that contain a lot of sugar. These include high-calorie sports drinks, fruit juice that is not diluted, and soda.  Caffeine.  Foods that are greasy or contain a lot of fat or sugar.  Take over-the-counter and prescription medicines only as told by your health care provider.  Do not take sodium tablets. This can lead to having too much sodium in the body (hypernatremia).  Eat foods that contain a healthy balance of electrolytes, such as bananas, oranges, potatoes, tomatoes, and spinach.  Keep all follow-up visits as told by your health care provider. This is important. Contact a health care provider if:  You have abdominal pain that:  Gets worse.  Stays in one area (localizes).  You have a rash.  You have a stiff neck.  You are more irritable than usual.  You are sleepier or more difficult to wake up than usual.  You feel weak or dizzy.  You feel very thirsty.  You have urinated only a small amount of very dark urine over 6-8 hours. Get help right away if:  You have symptoms of severe dehydration.  You cannot drink fluids without vomiting.  Your symptoms get worse with treatment.  You have a fever.  You have a severe headache.  You have vomiting or diarrhea that:  Gets worse.  Does not go away.  You have blood or green matter  (bile) in your vomit.  You have blood in your stool. This may cause stool to look black and tarry.  You have not urinated in 6-8 hours.  You faint.  Your heart rate while sitting still is over 100 beats a minute.  You have trouble breathing. This information is not intended to replace advice given to you by your health care provider. Make sure you discuss any questions you have with your health care provider. Document Released: 04/28/2005 Document Revised: 11/23/2015 Document Reviewed: 06/22/2015 Elsevier Interactive Patient Education  2017 Elsevier Inc.  

## 2016-07-17 ENCOUNTER — Ambulatory Visit (HOSPITAL_BASED_OUTPATIENT_CLINIC_OR_DEPARTMENT_OTHER): Payer: BLUE CROSS/BLUE SHIELD

## 2016-07-17 VITALS — BP 131/72 | HR 56 | Temp 98.2°F | Resp 16

## 2016-07-17 DIAGNOSIS — C099 Malignant neoplasm of tonsil, unspecified: Secondary | ICD-10-CM

## 2016-07-17 DIAGNOSIS — E860 Dehydration: Secondary | ICD-10-CM | POA: Diagnosis not present

## 2016-07-17 MED ORDER — HEPARIN SOD (PORK) LOCK FLUSH 100 UNIT/ML IV SOLN
500.0000 [IU] | Freq: Once | INTRAVENOUS | Status: AC | PRN
  Administered 2016-07-17: 500 [IU]
  Filled 2016-07-17: qty 5

## 2016-07-17 MED ORDER — SODIUM CHLORIDE 0.9% FLUSH
10.0000 mL | INTRAVENOUS | Status: DC | PRN
  Administered 2016-07-17: 10 mL
  Filled 2016-07-17: qty 10

## 2016-07-17 MED ORDER — SODIUM CHLORIDE 0.9 % IV SOLN
1000.0000 mL | Freq: Once | INTRAVENOUS | Status: AC
Start: 2016-07-17 — End: 2016-07-17
  Administered 2016-07-17: 1000 mL via INTRAVENOUS

## 2016-07-17 MED FILL — METOCLOPRAMIDE 5 MG/5 ML SY: 5 | 6 days supply | Qty: 240 | Fill #6

## 2016-07-17 NOTE — Patient Instructions (Signed)
Dehydration, Adult Dehydration is when there is not enough fluid or water in your body. This happens when you lose more fluids than you take in. Dehydration can range from mild to very bad. It should be treated right away to keep it from getting very bad. Symptoms of mild dehydration may include:   Thirst.  Dry lips.  Slightly dry mouth.  Dry, warm skin.  Dizziness. Symptoms of moderate dehydration may include:   Very dry mouth.  Muscle cramps.  Dark pee (urine). Pee may be the color of tea.  Your body making less pee.  Your eyes making fewer tears.  Heartbeat that is uneven or faster than normal (palpitations).  Headache.  Light-headedness, especially when you stand up from sitting.  Fainting (syncope). Symptoms of very bad dehydration may include:   Changes in skin, such as:  Cold and clammy skin.  Blotchy (mottled) or pale skin.  Skin that does not quickly return to normal after being lightly pinched and let go (poor skin turgor).  Changes in body fluids, such as:  Feeling very thirsty.  Your eyes making fewer tears.  Not sweating when body temperature is high, such as in hot weather.  Your body making very little pee.  Changes in vital signs, such as:  Weak pulse.  Pulse that is more than 100 beats a minute when you are sitting still.  Fast breathing.  Low blood pressure.  Other changes, such as:  Sunken eyes.  Cold hands and feet.  Confusion.  Lack of energy (lethargy).  Trouble waking up from sleep.  Short-term weight loss.  Unconsciousness. Follow these instructions at home:  If told by your doctor, drink an ORS:  Make an ORS by using instructions on the package.  Start by drinking small amounts, about  cup (120 mL) every 5-10 minutes.  Slowly drink more until you have had the amount that your doctor said to have.  Drink enough clear fluid to keep your pee clear or pale yellow. If you were told to drink an ORS, finish the  ORS first, then start slowly drinking clear fluids. Drink fluids such as:  Water. Do not drink only water by itself. Doing that can make the salt (sodium) level in your body get too low (hyponatremia).  Ice chips.  Fruit juice that you have added water to (diluted).  Low-calorie sports drinks.  Avoid:  Alcohol.  Drinks that have a lot of sugar. These include high-calorie sports drinks, fruit juice that does not have water added, and soda.  Caffeine.  Foods that are greasy or have a lot of fat or sugar.  Take over-the-counter and prescription medicines only as told by your doctor.  Do not take salt tablets. Doing that can make the salt level in your body get too high (hypernatremia).  Eat foods that have minerals (electrolytes). Examples include bananas, oranges, potatoes, tomatoes, and spinach.  Keep all follow-up visits as told by your doctor. This is important. Contact a doctor if:  You have belly (abdominal) pain that:  Gets worse.  Stays in one area (localizes).  You have a rash.  You have a stiff neck.  You get angry or annoyed more easily than normal (irritability).  You are more sleepy than normal.  You have a harder time waking up than normal.  You feel:  Weak.  Dizzy.  Very thirsty.  You have peed (urinated) only a small amount of very dark pee during 6-8 hours. Get help right away if:  You   have symptoms of very bad dehydration.  You cannot drink fluids without throwing up (vomiting).  Your symptoms get worse with treatment.  You have a fever.  You have a very bad headache.  You are throwing up or having watery poop (diarrhea) and it:  Gets worse.  Does not go away.  You have blood or something green (bile) in your throw-up.  You have blood in your poop (stool). This may cause poop to look black and tarry.  You have not peed in 6-8 hours.  You pass out (faint).  Your heart rate when you are sitting still is more than 100 beats a  minute.  You have trouble breathing. This information is not intended to replace advice given to you by your health care provider. Make sure you discuss any questions you have with your health care provider. Document Released: 02/22/2009 Document Revised: 11/16/2015 Document Reviewed: 06/22/2015 Elsevier Interactive Patient Education  2017 Elsevier Inc.  

## 2016-07-19 ENCOUNTER — Ambulatory Visit: Payer: Self-pay

## 2016-07-19 ENCOUNTER — Ambulatory Visit (HOSPITAL_BASED_OUTPATIENT_CLINIC_OR_DEPARTMENT_OTHER): Payer: BLUE CROSS/BLUE SHIELD

## 2016-07-19 VITALS — BP 123/79 | HR 56 | Temp 97.8°F | Resp 16

## 2016-07-19 DIAGNOSIS — C099 Malignant neoplasm of tonsil, unspecified: Secondary | ICD-10-CM | POA: Diagnosis not present

## 2016-07-19 MED ORDER — HEPARIN SOD (PORK) LOCK FLUSH 100 UNIT/ML IV SOLN
500.0000 [IU] | Freq: Once | INTRAVENOUS | Status: AC | PRN
  Administered 2016-07-19: 500 [IU]
  Filled 2016-07-19: qty 5

## 2016-07-19 MED ORDER — SODIUM CHLORIDE 0.9% FLUSH
10.0000 mL | INTRAVENOUS | Status: DC | PRN
  Administered 2016-07-19: 10 mL
  Filled 2016-07-19: qty 10

## 2016-07-19 MED ORDER — SODIUM CHLORIDE 0.9 % IV SOLN
Freq: Once | INTRAVENOUS | Status: AC
  Administered 2016-07-19: 08:00:00 via INTRAVENOUS

## 2016-07-19 NOTE — Patient Instructions (Signed)
Rehydration, Adult Rehydration is the replacement of body fluids and salts and minerals (electrolytes) that are lost during dehydration. Dehydration is when there is not enough fluid or water in the body. This happens when you lose more fluids than you take in. Common causes of dehydration include:  Vomiting.  Diarrhea.  Excessive sweating, such as from heat exposure or exercise.  Taking medicines that cause the body to lose excess fluid (diuretics).  Impaired kidney function.  Not drinking enough fluid.  Certain illnesses or infections.  Certain poorly controlled long-term (chronic) illnesses, such as diabetes, heart disease, and kidney disease. Symptoms of mild dehydration may include thirst, dry lips and mouth, dry skin, and dizziness. Symptoms of severe dehydration may include increased heart rate, confusion, fainting, and not urinating. You can rehydrate by drinking certain fluids or getting fluids through an IV tube, as told by your health care provider. What are the risks? Generally, rehydration is safe. However, one problem that can happen is taking in too much fluid (overhydration). This is rare. If overhydration happens, it can cause an electrolyte imbalance, kidney failure, or a decrease in salt (sodium) levels in the body. How to rehydrate Follow instructions from your health care provider for rehydration. The kind of fluid you should drink and the amount you should drink depend on your condition.  If directed by your health care provider, drink an oral rehydration solution (ORS). This is a drink designed to treat dehydration that is found in pharmacies and retail stores.  Make an ORS by following instructions on the package.  Start by drinking small amounts, about  cup (120 mL) every 5-10 minutes.  Slowly increase how much you drink until you have taken the amount recommended by your health care provider.  Drink enough clear fluids to keep your urine clear or pale  yellow. If you were instructed to drink an ORS, finish the ORS first, then start slowly drinking other clear fluids. Drink fluids such as:  Water. Do not drink only water. Doing that can lead to having too little sodium in your body (hyponatremia).  Ice chips.  Fruit juice that you have added water to (diluted juice).  Low-calorie sports drinks.  If you are severely dehydrated, your health care provider may recommend that you receive fluids through an IV tube in the hospital.  Do not take sodium tablets. Doing that can lead to the condition of having too much sodium in your body (hypernatremia). Eating while you rehydrate Follow instructions from your health care provider about what to eat while you rehydrate. Your health care provider may recommend that you slowly begin eating regular foods in small amounts.  Eat foods that contain a healthy balance of electrolytes, such as bananas, oranges, potatoes, tomatoes, and spinach.  Avoid foods that are greasy or contain a lot of fat or sugar. In some cases, you may get nutrition through a feeding tube that is passed through your nose and into your stomach (nasogastric tube, or NG tube). This may be done if you have uncontrolled vomiting or diarrhea. Beverages to avoid Certain beverages may make dehydration worse. While you rehydrate, avoid:  Alcohol.  Caffeine.  Drinks that contain a lot of sugar. These include:  High-calorie sports drinks.  Fruit juice that is not diluted.  Soda. Check nutrition labels to see how much sugar or caffeine a beverage contains. Signs of dehydration recovery You may be recovering from dehydration if:  You are urinating more often than before you started rehydrating.  Your  urine is clear or pale yellow.  Your energy level improves.  You vomit less frequently.  You have diarrhea less frequently.  Your appetite improves or returns to normal.  You feel less dizzy or less light-headed.  Your skin  tone and color start to look more normal. Contact a health care provider if:  You continue to have symptoms of mild dehydration, such as:  Thirst.  Dry lips.  Slightly dry mouth.  Dry, warm skin.  Dizziness.  You continue to vomit or have diarrhea. Get help right away if:  You have symptoms of dehydration that get worse.  You feel:  Confused.  Weak.  Like you are going to faint.  You have not urinated in 6-8 hours.  You have very dark urine.  You have trouble breathing.  Your heart rate while sitting still is over 100 beats a minute.  You cannot drink fluids without vomiting.  You have vomiting or diarrhea that:  Gets worse.  Does not go away.  You have a fever. This information is not intended to replace advice given to you by your health care provider. Make sure you discuss any questions you have with your health care provider. Document Released: 07/21/2011 Document Revised: 11/16/2015 Document Reviewed: 06/22/2015 Elsevier Interactive Patient Education  2017 [employer].

## 2016-07-22 ENCOUNTER — Other Ambulatory Visit (HOSPITAL_BASED_OUTPATIENT_CLINIC_OR_DEPARTMENT_OTHER): Payer: BLUE CROSS/BLUE SHIELD

## 2016-07-22 ENCOUNTER — Encounter: Payer: Self-pay | Admitting: Radiation Oncology

## 2016-07-22 ENCOUNTER — Ambulatory Visit (HOSPITAL_BASED_OUTPATIENT_CLINIC_OR_DEPARTMENT_OTHER): Payer: BLUE CROSS/BLUE SHIELD

## 2016-07-22 ENCOUNTER — Ambulatory Visit: Payer: BLUE CROSS/BLUE SHIELD | Admitting: Nutrition

## 2016-07-22 VITALS — BP 123/76 | HR 72 | Temp 98.4°F | Resp 16

## 2016-07-22 DIAGNOSIS — C099 Malignant neoplasm of tonsil, unspecified: Secondary | ICD-10-CM

## 2016-07-22 DIAGNOSIS — E860 Dehydration: Secondary | ICD-10-CM | POA: Diagnosis not present

## 2016-07-22 DIAGNOSIS — C090 Malignant neoplasm of tonsillar fossa: Secondary | ICD-10-CM

## 2016-07-22 DIAGNOSIS — R634 Abnormal weight loss: Secondary | ICD-10-CM

## 2016-07-22 LAB — CBC WITH DIFFERENTIAL/PLATELET
BASO%: 0.5 % (ref 0.0–2.0)
Basophils Absolute: 0 10*3/uL (ref 0.0–0.1)
EOS%: 1.4 % (ref 0.0–7.0)
Eosinophils Absolute: 0.1 10*3/uL (ref 0.0–0.5)
HCT: 30.7 % — ABNORMAL LOW (ref 38.4–49.9)
HGB: 10.6 g/dL — ABNORMAL LOW (ref 13.0–17.1)
LYMPH%: 9.3 % — AB (ref 14.0–49.0)
MCH: 30.4 pg (ref 27.2–33.4)
MCHC: 34.5 g/dL (ref 32.0–36.0)
MCV: 88.2 fL (ref 79.3–98.0)
MONO#: 0.4 10*3/uL (ref 0.1–0.9)
MONO%: 9.5 % (ref 0.0–14.0)
NEUT#: 3.3 10*3/uL (ref 1.5–6.5)
NEUT%: 79.3 % — AB (ref 39.0–75.0)
PLATELETS: 319 10*3/uL (ref 140–400)
RBC: 3.48 10*6/uL — AB (ref 4.20–5.82)
RDW: 14.6 % (ref 11.0–14.6)
WBC: 4.2 10*3/uL (ref 4.0–10.3)
lymph#: 0.4 10*3/uL — ABNORMAL LOW (ref 0.9–3.3)

## 2016-07-22 LAB — MAGNESIUM: Magnesium: 1.9 mg/dl (ref 1.5–2.5)

## 2016-07-22 LAB — COMPREHENSIVE METABOLIC PANEL
ALBUMIN: 4 g/dL (ref 3.5–5.0)
ALK PHOS: 41 U/L (ref 40–150)
ALT: 21 U/L (ref 0–55)
AST: 17 U/L (ref 5–34)
Anion Gap: 9 mEq/L (ref 3–11)
BUN: 27.1 mg/dL — ABNORMAL HIGH (ref 7.0–26.0)
CO2: 29 mEq/L (ref 22–29)
Calcium: 10.2 mg/dL (ref 8.4–10.4)
Chloride: 101 mEq/L (ref 98–109)
Creatinine: 1.3 mg/dL (ref 0.7–1.3)
EGFR: 64 mL/min/{1.73_m2} — ABNORMAL LOW (ref >90)
GLUCOSE: 148 mg/dL — AB (ref 70–140)
POTASSIUM: 4.2 meq/L (ref 3.5–5.1)
Sodium: 139 mEq/L (ref 136–145)
Total Bilirubin: 0.3 mg/dL (ref 0.20–1.20)
Total Protein: 6.9 g/dL (ref 6.4–8.3)

## 2016-07-22 LAB — TSH: TSH: 1.59 m(IU)/L (ref 0.320–4.118)

## 2016-07-22 MED ORDER — SODIUM CHLORIDE 0.9 % IV SOLN
Freq: Once | INTRAVENOUS | Status: AC
  Administered 2016-07-22: 11:00:00 via INTRAVENOUS

## 2016-07-22 MED ORDER — HEPARIN SOD (PORK) LOCK FLUSH 100 UNIT/ML IV SOLN
500.0000 [IU] | Freq: Once | INTRAVENOUS | Status: AC | PRN
  Administered 2016-07-22: 500 [IU]
  Filled 2016-07-22: qty 5

## 2016-07-22 MED ORDER — SODIUM CHLORIDE 0.9% FLUSH
10.0000 mL | INTRAVENOUS | Status: DC | PRN
  Administered 2016-07-22: 10 mL
  Filled 2016-07-22: qty 10

## 2016-07-22 NOTE — Progress Notes (Signed)
Paperwork ([employer]) received 3/13, given to nursing

## 2016-07-22 NOTE — Progress Notes (Signed)
Nutrition follow-up completed with patient during IV fluids. Weight improved and documented as 165.7 pounds March 6 increased from 156 pounds January 23. Patient reports nausea has resolved. Continues to tolerate 7 cans Osmolite 1.5 daily via PEG. Patient is tolerating liquids such as water and juices.  Per speech therapy, patient is safe for dysphagia 2 thin liquid diet. Patient reports he is "working himself up" to trying some soft moist foods.  He also states that he will try oral nutrition supplements.   Nutrition diagnosis:  Inadequate oral intake continues. Severe Malnutrition has improved.  Intervention: Educated patient on increasing overall fluid intake. Reviewed soft moist foods patient should begin to try. Encouraged patient to keep a food journal with foods he does tolerate and foods he does not tolerate. Questions were answered.  Teach back method used.  Monitoring, evaluation, goals:  Patient will work to increase oral intake so tube feeding can be decreased.  Next visit: Thursday, March 22, during IV fluids.  **Disclaimer: This note was dictated with voice recognition software. Similar sounding words can inadvertently be transcribed and this note may contain transcription errors which may not have been corrected upon publication of note.**

## 2016-07-22 NOTE — Patient Instructions (Signed)
Rehydration, Adult Rehydration is the replacement of body fluids and salts and minerals (electrolytes) that are lost during dehydration. Dehydration is when there is not enough fluid or water in the body. This happens when you lose more fluids than you take in. Common causes of dehydration include:  Vomiting.  Diarrhea.  Excessive sweating, such as from heat exposure or exercise.  Taking medicines that cause the body to lose excess fluid (diuretics).  Impaired kidney function.  Not drinking enough fluid.  Certain illnesses or infections.  Certain poorly controlled long-term (chronic) illnesses, such as diabetes, heart disease, and kidney disease. Symptoms of mild dehydration may include thirst, dry lips and mouth, dry skin, and dizziness. Symptoms of severe dehydration may include increased heart rate, confusion, fainting, and not urinating. You can rehydrate by drinking certain fluids or getting fluids through an IV tube, as told by your health care provider. What are the risks? Generally, rehydration is safe. However, one problem that can happen is taking in too much fluid (overhydration). This is rare. If overhydration happens, it can cause an electrolyte imbalance, kidney failure, or a decrease in salt (sodium) levels in the body. How to rehydrate Follow instructions from your health care provider for rehydration. The kind of fluid you should drink and the amount you should drink depend on your condition.  If directed by your health care provider, drink an oral rehydration solution (ORS). This is a drink designed to treat dehydration that is found in pharmacies and retail stores.  Make an ORS by following instructions on the package.  Start by drinking small amounts, about  cup (120 mL) every 5-10 minutes.  Slowly increase how much you drink until you have taken the amount recommended by your health care provider.  Drink enough clear fluids to keep your urine clear or pale  yellow. If you were instructed to drink an ORS, finish the ORS first, then start slowly drinking other clear fluids. Drink fluids such as:  Water. Do not drink only water. Doing that can lead to having too little sodium in your body (hyponatremia).  Ice chips.  Fruit juice that you have added water to (diluted juice).  Low-calorie sports drinks.  If you are severely dehydrated, your health care provider may recommend that you receive fluids through an IV tube in the hospital.  Do not take sodium tablets. Doing that can lead to the condition of having too much sodium in your body (hypernatremia). Eating while you rehydrate Follow instructions from your health care provider about what to eat while you rehydrate. Your health care provider may recommend that you slowly begin eating regular foods in small amounts.  Eat foods that contain a healthy balance of electrolytes, such as bananas, oranges, potatoes, tomatoes, and spinach.  Avoid foods that are greasy or contain a lot of fat or sugar. In some cases, you may get nutrition through a feeding tube that is passed through your nose and into your stomach (nasogastric tube, or NG tube). This may be done if you have uncontrolled vomiting or diarrhea. Beverages to avoid Certain beverages may make dehydration worse. While you rehydrate, avoid:  Alcohol.  Caffeine.  Drinks that contain a lot of sugar. These include:  High-calorie sports drinks.  Fruit juice that is not diluted.  Soda. Check nutrition labels to see how much sugar or caffeine a beverage contains. Signs of dehydration recovery You may be recovering from dehydration if:  You are urinating more often than before you started rehydrating.  Your  urine is clear or pale yellow.  Your energy level improves.  You vomit less frequently.  You have diarrhea less frequently.  Your appetite improves or returns to normal.  You feel less dizzy or less light-headed.  Your skin  tone and color start to look more normal. Contact a health care provider if:  You continue to have symptoms of mild dehydration, such as:  Thirst.  Dry lips.  Slightly dry mouth.  Dry, warm skin.  Dizziness.  You continue to vomit or have diarrhea. Get help right away if:  You have symptoms of dehydration that get worse.  You feel:  Confused.  Weak.  Like you are going to faint.  You have not urinated in 6-8 hours.  You have very dark urine.  You have trouble breathing.  Your heart rate while sitting still is over 100 beats a minute.  You cannot drink fluids without vomiting.  You have vomiting or diarrhea that:  Gets worse.  Does not go away.  You have a fever. This information is not intended to replace advice given to you by your health care provider. Make sure you discuss any questions you have with your health care provider. Document Released: 07/21/2011 Document Revised: 11/16/2015 Document Reviewed: 06/22/2015 Elsevier Interactive Patient Education  2017 [employer].

## 2016-07-24 ENCOUNTER — Ambulatory Visit (HOSPITAL_BASED_OUTPATIENT_CLINIC_OR_DEPARTMENT_OTHER): Payer: BLUE CROSS/BLUE SHIELD | Admitting: Nurse Practitioner

## 2016-07-24 VITALS — BP 138/86 | HR 60 | Temp 98.1°F | Resp 18 | Ht 70.0 in | Wt 165.6 lb

## 2016-07-24 DIAGNOSIS — E860 Dehydration: Secondary | ICD-10-CM | POA: Diagnosis not present

## 2016-07-24 DIAGNOSIS — C099 Malignant neoplasm of tonsil, unspecified: Secondary | ICD-10-CM

## 2016-07-24 MED ORDER — SODIUM CHLORIDE 0.9 % IV SOLN
Freq: Once | INTRAVENOUS | Status: AC
  Administered 2016-07-24: 09:00:00 via INTRAVENOUS

## 2016-07-24 MED ORDER — HEPARIN SOD (PORK) LOCK FLUSH 100 UNIT/ML IV SOLN
500.0000 [IU] | Freq: Once | INTRAVENOUS | Status: AC | PRN
  Administered 2016-07-24: 500 [IU]
  Filled 2016-07-24: qty 5

## 2016-07-24 MED ORDER — SODIUM CHLORIDE 0.9% FLUSH
10.0000 mL | INTRAVENOUS | Status: DC | PRN
  Administered 2016-07-24: 10 mL
  Filled 2016-07-24: qty 10

## 2016-07-24 NOTE — Progress Notes (Signed)
OK to infuse fluids @ 750 per Dr. Alvy Bimler.  Pt request for today  RN visit for4 IV Fluids.

## 2016-07-24 NOTE — Patient Instructions (Signed)
Dehydration, Adult Dehydration is a condition in which there is not enough fluid or water in the body. This happens when you lose more fluids than you take in. Important organs, such as the kidneys, brain, and heart, cannot function without a proper amount of fluids. Any loss of fluids from the body can lead to dehydration. Dehydration can range from mild to severe. This condition should be treated right away to prevent it from becoming severe. What are the causes? This condition may be caused by:  Vomiting.  Diarrhea.  Excessive sweating, such as from heat exposure or exercise.  Not drinking enough fluid, especially:  When ill.  While doing activity that requires a lot of energy.  Excessive urination.  Fever.  Infection.  Certain medicines, such as medicines that cause the body to lose excess fluid (diuretics).  Inability to access safe drinking water.  Reduced physical ability to get adequate water and food. What increases the risk? This condition is more likely to develop in people:  Who have a poorly controlled long-term (chronic) illness, such as diabetes, heart disease, or kidney disease.  Who are age 65 or older.  Who are disabled.  Who live in a place with high altitude.  Who play endurance sports. What are the signs or symptoms? Symptoms of mild dehydration may include:   Thirst.  Dry lips.  Slightly dry mouth.  Dry, warm skin.  Dizziness. Symptoms of moderate dehydration may include:   Very dry mouth.  Muscle cramps.  Dark urine. Urine may be the color of tea.  Decreased urine production.  Decreased tear production.  Heartbeat that is irregular or faster than normal (palpitations).  Headache.  Light-headedness, especially when you stand up from a sitting position.  Fainting (syncope). Symptoms of severe dehydration may include:   Changes in skin, such as:  Cold and clammy skin.  Blotchy (mottled) or pale skin.  Skin that does  not quickly return to normal after being lightly pinched and released (poor skin turgor).  Changes in body fluids, such as:  Extreme thirst.  No tear production.  Inability to sweat when body temperature is high, such as in hot weather.  Very little urine production.  Changes in vital signs, such as:  Weak pulse.  Pulse that is more than 100 beats a minute when sitting still.  Rapid breathing.  Low blood pressure.  Other changes, such as:  Sunken eyes.  Cold hands and feet.  Confusion.  Lack of energy (lethargy).  Difficulty waking up from sleep.  Short-term weight loss.  Unconsciousness. How is this diagnosed? This condition is diagnosed based on your symptoms and a physical exam. Blood and urine tests may be done to help confirm the diagnosis. How is this treated? Treatment for this condition depends on the severity. Mild or moderate dehydration can often be treated at home. Treatment should be started right away. Do not wait until dehydration becomes severe. Severe dehydration is an emergency and it needs to be treated in a hospital. Treatment for mild dehydration may include:   Drinking more fluids.  Replacing salts and minerals in your blood (electrolytes) that you may have lost. Treatment for moderate dehydration may include:   Drinking an oral rehydration solution (ORS). This is a drink that helps you replace fluids and electrolytes (rehydrate). It can be found at pharmacies and retail stores. Treatment for severe dehydration may include:   Receiving fluids through an IV tube.  Receiving an electrolyte solution through a feeding tube that is   passed through your nose and into your stomach (nasogastric tube, or NG tube).  Correcting any abnormalities in electrolytes.  Treating the underlying cause of dehydration. Follow these instructions at home:  If directed by your health care provider, drink an ORS:  Make an ORS by following instructions on the  package.  Start by drinking small amounts, about  cup (120 mL) every 5-10 minutes.  Slowly increase how much you drink until you have taken the amount recommended by your health care provider.  Drink enough clear fluid to keep your urine clear or pale yellow. If you were told to drink an ORS, finish the ORS first, then start slowly drinking other clear fluids. Drink fluids such as:  Water. Do not drink only water. Doing that can lead to having too little salt (sodium) in the body (hyponatremia).  Ice chips.  Fruit juice that you have added water to (diluted fruit juice).  Low-calorie sports drinks.  Avoid:  Alcohol.  Drinks that contain a lot of sugar. These include high-calorie sports drinks, fruit juice that is not diluted, and soda.  Caffeine.  Foods that are greasy or contain a lot of fat or sugar.  Take over-the-counter and prescription medicines only as told by your health care provider.  Do not take sodium tablets. This can lead to having too much sodium in the body (hypernatremia).  Eat foods that contain a healthy balance of electrolytes, such as bananas, oranges, potatoes, tomatoes, and spinach.  Keep all follow-up visits as told by your health care provider. This is important. Contact a health care provider if:  You have abdominal pain that:  Gets worse.  Stays in one area (localizes).  You have a rash.  You have a stiff neck.  You are more irritable than usual.  You are sleepier or more difficult to wake up than usual.  You feel weak or dizzy.  You feel very thirsty.  You have urinated only a small amount of very dark urine over 6-8 hours. Get help right away if:  You have symptoms of severe dehydration.  You cannot drink fluids without vomiting.  Your symptoms get worse with treatment.  You have a fever.  You have a severe headache.  You have vomiting or diarrhea that:  Gets worse.  Does not go away.  You have blood or green matter  (bile) in your vomit.  You have blood in your stool. This may cause stool to look black and tarry.  You have not urinated in 6-8 hours.  You faint.  Your heart rate while sitting still is over 100 beats a minute.  You have trouble breathing. This information is not intended to replace advice given to you by your health care provider. Make sure you discuss any questions you have with your health care provider. Document Released: 04/28/2005 Document Revised: 11/23/2015 Document Reviewed: 06/22/2015 Elsevier Interactive Patient Education  2017 Elsevier Inc.  

## 2016-07-25 MED FILL — METOCLOPRAMIDE 5 MG/5 ML SY: 5 | 6 days supply | Qty: 240 | Fill #7

## 2016-07-26 ENCOUNTER — Ambulatory Visit: Payer: Self-pay

## 2016-07-26 ENCOUNTER — Ambulatory Visit (HOSPITAL_BASED_OUTPATIENT_CLINIC_OR_DEPARTMENT_OTHER): Payer: BLUE CROSS/BLUE SHIELD

## 2016-07-26 VITALS — BP 132/84 | HR 65 | Temp 98.5°F | Resp 18 | Ht 70.0 in

## 2016-07-26 DIAGNOSIS — C099 Malignant neoplasm of tonsil, unspecified: Secondary | ICD-10-CM

## 2016-07-26 MED ORDER — HEPARIN SOD (PORK) LOCK FLUSH 100 UNIT/ML IV SOLN
500.0000 [IU] | Freq: Once | INTRAVENOUS | Status: AC | PRN
  Administered 2016-07-26: 500 [IU]
  Filled 2016-07-26: qty 5

## 2016-07-26 MED ORDER — SODIUM CHLORIDE 0.9% FLUSH
10.0000 mL | INTRAVENOUS | Status: DC | PRN
  Administered 2016-07-26: 10 mL
  Filled 2016-07-26: qty 10

## 2016-07-26 MED ORDER — ALTEPLASE 2 MG IJ SOLR
2.0000 mg | Freq: Once | INTRAMUSCULAR | Status: DC | PRN
  Filled 2016-07-26: qty 2

## 2016-07-26 MED ORDER — HEPARIN SOD (PORK) LOCK FLUSH 100 UNIT/ML IV SOLN
250.0000 [IU] | Freq: Once | INTRAVENOUS | Status: DC | PRN
  Filled 2016-07-26: qty 5

## 2016-07-26 MED ORDER — SODIUM CHLORIDE 0.9 % IV SOLN
Freq: Once | INTRAVENOUS | Status: AC
  Administered 2016-07-26: 08:00:00 via INTRAVENOUS

## 2016-07-26 NOTE — Patient Instructions (Signed)
Dehydration, Adult Dehydration is when there is not enough fluid or water in your body. This happens when you lose more fluids than you take in. Dehydration can range from mild to very bad. It should be treated right away to keep it from getting very bad. Symptoms of mild dehydration may include:   Thirst.  Dry lips.  Slightly dry mouth.  Dry, warm skin.  Dizziness. Symptoms of moderate dehydration may include:   Very dry mouth.  Muscle cramps.  Dark pee (urine). Pee may be the color of tea.  Your body making less pee.  Your eyes making fewer tears.  Heartbeat that is uneven or faster than normal (palpitations).  Headache.  Light-headedness, especially when you stand up from sitting.  Fainting (syncope). Symptoms of very bad dehydration may include:   Changes in skin, such as:  Cold and clammy skin.  Blotchy (mottled) or pale skin.  Skin that does not quickly return to normal after being lightly pinched and let go (poor skin turgor).  Changes in body fluids, such as:  Feeling very thirsty.  Your eyes making fewer tears.  Not sweating when body temperature is high, such as in hot weather.  Your body making very little pee.  Changes in vital signs, such as:  Weak pulse.  Pulse that is more than 100 beats a minute when you are sitting still.  Fast breathing.  Low blood pressure.  Other changes, such as:  Sunken eyes.  Cold hands and feet.  Confusion.  Lack of energy (lethargy).  Trouble waking up from sleep.  Short-term weight loss.  Unconsciousness. Follow these instructions at home:  If told by your doctor, drink an ORS:  Make an ORS by using instructions on the package.  Start by drinking small amounts, about  cup (120 mL) every 5-10 minutes.  Slowly drink more until you have had the amount that your doctor said to have.  Drink enough clear fluid to keep your pee clear or pale yellow. If you were told to drink an ORS, finish the  ORS first, then start slowly drinking clear fluids. Drink fluids such as:  Water. Do not drink only water by itself. Doing that can make the salt (sodium) level in your body get too low (hyponatremia).  Ice chips.  Fruit juice that you have added water to (diluted).  Low-calorie sports drinks.  Avoid:  Alcohol.  Drinks that have a lot of sugar. These include high-calorie sports drinks, fruit juice that does not have water added, and soda.  Caffeine.  Foods that are greasy or have a lot of fat or sugar.  Take over-the-counter and prescription medicines only as told by your doctor.  Do not take salt tablets. Doing that can make the salt level in your body get too high (hypernatremia).  Eat foods that have minerals (electrolytes). Examples include bananas, oranges, potatoes, tomatoes, and spinach.  Keep all follow-up visits as told by your doctor. This is important. Contact a doctor if:  You have belly (abdominal) pain that:  Gets worse.  Stays in one area (localizes).  You have a rash.  You have a stiff neck.  You get angry or annoyed more easily than normal (irritability).  You are more sleepy than normal.  You have a harder time waking up than normal.  You feel:  Weak.  Dizzy.  Very thirsty.  You have peed (urinated) only a small amount of very dark pee during 6-8 hours. Get help right away if:  You   have symptoms of very bad dehydration.  You cannot drink fluids without throwing up (vomiting).  Your symptoms get worse with treatment.  You have a fever.  You have a very bad headache.  You are throwing up or having watery poop (diarrhea) and it:  Gets worse.  Does not go away.  You have blood or something green (bile) in your throw-up.  You have blood in your poop (stool). This may cause poop to look black and tarry.  You have not peed in 6-8 hours.  You pass out (faint).  Your heart rate when you are sitting still is more than 100 beats a  minute.  You have trouble breathing. This information is not intended to replace advice given to you by your health care provider. Make sure you discuss any questions you have with your health care provider. Document Released: 02/22/2009 Document Revised: 11/16/2015 Document Reviewed: 06/22/2015 Elsevier Interactive Patient Education  2017 Elsevier Inc.  

## 2016-07-29 ENCOUNTER — Ambulatory Visit (HOSPITAL_BASED_OUTPATIENT_CLINIC_OR_DEPARTMENT_OTHER): Payer: BLUE CROSS/BLUE SHIELD | Admitting: Nurse Practitioner

## 2016-07-29 VITALS — BP 118/75 | HR 68 | Temp 97.8°F | Resp 18 | Ht 70.0 in | Wt 167.6 lb

## 2016-07-29 DIAGNOSIS — E860 Dehydration: Secondary | ICD-10-CM

## 2016-07-29 DIAGNOSIS — C099 Malignant neoplasm of tonsil, unspecified: Secondary | ICD-10-CM | POA: Diagnosis not present

## 2016-07-29 MED ORDER — SODIUM CHLORIDE 0.9% FLUSH
10.0000 mL | INTRAVENOUS | Status: DC | PRN
  Administered 2016-07-29: 10 mL
  Filled 2016-07-29: qty 10

## 2016-07-29 MED ORDER — HEPARIN SOD (PORK) LOCK FLUSH 100 UNIT/ML IV SOLN
500.0000 [IU] | Freq: Once | INTRAVENOUS | Status: AC | PRN
  Administered 2016-07-29: 500 [IU]
  Filled 2016-07-29: qty 5

## 2016-07-29 MED ORDER — SODIUM CHLORIDE 0.9 % IV SOLN
Freq: Once | INTRAVENOUS | Status: AC
  Administered 2016-07-29: 13:00:00 via INTRAVENOUS

## 2016-07-29 NOTE — Progress Notes (Signed)
RN visit for IV fluids. Tolerating increasing amounts of oral fluids >2 bottles/day. Adding more variety of fluids as well as water. He feels ready to try soups etc.  Sweet drinks are not tolerated as well.  Asking for tomato soup today and sprite.  Called Gayleen Orem , navigator to see pt at pt request. Liliane Channel was able to come up and see pt.  Also briefly seen by Dory Peru, nutritionist. Tolerated tomato soup and sprite well. Consumed all of both.

## 2016-07-29 NOTE — Patient Instructions (Signed)
Dehydration, Adult Dehydration is a condition in which there is not enough fluid or water in the body. This happens when you lose more fluids than you take in. Important organs, such as the kidneys, brain, and heart, cannot function without a proper amount of fluids. Any loss of fluids from the body can lead to dehydration. Dehydration can range from mild to severe. This condition should be treated right away to prevent it from becoming severe. What are the causes? This condition may be caused by:  Vomiting.  Diarrhea.  Excessive sweating, such as from heat exposure or exercise.  Not drinking enough fluid, especially:  When ill.  While doing activity that requires a lot of energy.  Excessive urination.  Fever.  Infection.  Certain medicines, such as medicines that cause the body to lose excess fluid (diuretics).  Inability to access safe drinking water.  Reduced physical ability to get adequate water and food. What increases the risk? This condition is more likely to develop in people:  Who have a poorly controlled long-term (chronic) illness, such as diabetes, heart disease, or kidney disease.  Who are age 65 or older.  Who are disabled.  Who live in a place with high altitude.  Who play endurance sports. What are the signs or symptoms? Symptoms of mild dehydration may include:   Thirst.  Dry lips.  Slightly dry mouth.  Dry, warm skin.  Dizziness. Symptoms of moderate dehydration may include:   Very dry mouth.  Muscle cramps.  Dark urine. Urine may be the color of tea.  Decreased urine production.  Decreased tear production.  Heartbeat that is irregular or faster than normal (palpitations).  Headache.  Light-headedness, especially when you stand up from a sitting position.  Fainting (syncope). Symptoms of severe dehydration may include:   Changes in skin, such as:  Cold and clammy skin.  Blotchy (mottled) or pale skin.  Skin that does  not quickly return to normal after being lightly pinched and released (poor skin turgor).  Changes in body fluids, such as:  Extreme thirst.  No tear production.  Inability to sweat when body temperature is high, such as in hot weather.  Very little urine production.  Changes in vital signs, such as:  Weak pulse.  Pulse that is more than 100 beats a minute when sitting still.  Rapid breathing.  Low blood pressure.  Other changes, such as:  Sunken eyes.  Cold hands and feet.  Confusion.  Lack of energy (lethargy).  Difficulty waking up from sleep.  Short-term weight loss.  Unconsciousness. How is this diagnosed? This condition is diagnosed based on your symptoms and a physical exam. Blood and urine tests may be done to help confirm the diagnosis. How is this treated? Treatment for this condition depends on the severity. Mild or moderate dehydration can often be treated at home. Treatment should be started right away. Do not wait until dehydration becomes severe. Severe dehydration is an emergency and it needs to be treated in a hospital. Treatment for mild dehydration may include:   Drinking more fluids.  Replacing salts and minerals in your blood (electrolytes) that you may have lost. Treatment for moderate dehydration may include:   Drinking an oral rehydration solution (ORS). This is a drink that helps you replace fluids and electrolytes (rehydrate). It can be found at pharmacies and retail stores. Treatment for severe dehydration may include:   Receiving fluids through an IV tube.  Receiving an electrolyte solution through a feeding tube that is   passed through your nose and into your stomach (nasogastric tube, or NG tube).  Correcting any abnormalities in electrolytes.  Treating the underlying cause of dehydration. Follow these instructions at home:  If directed by your health care provider, drink an ORS:  Make an ORS by following instructions on the  package.  Start by drinking small amounts, about  cup (120 mL) every 5-10 minutes.  Slowly increase how much you drink until you have taken the amount recommended by your health care provider.  Drink enough clear fluid to keep your urine clear or pale yellow. If you were told to drink an ORS, finish the ORS first, then start slowly drinking other clear fluids. Drink fluids such as:  Water. Do not drink only water. Doing that can lead to having too little salt (sodium) in the body (hyponatremia).  Ice chips.  Fruit juice that you have added water to (diluted fruit juice).  Low-calorie sports drinks.  Avoid:  Alcohol.  Drinks that contain a lot of sugar. These include high-calorie sports drinks, fruit juice that is not diluted, and soda.  Caffeine.  Foods that are greasy or contain a lot of fat or sugar.  Take over-the-counter and prescription medicines only as told by your health care provider.  Do not take sodium tablets. This can lead to having too much sodium in the body (hypernatremia).  Eat foods that contain a healthy balance of electrolytes, such as bananas, oranges, potatoes, tomatoes, and spinach.  Keep all follow-up visits as told by your health care provider. This is important. Contact a health care provider if:  You have abdominal pain that:  Gets worse.  Stays in one area (localizes).  You have a rash.  You have a stiff neck.  You are more irritable than usual.  You are sleepier or more difficult to wake up than usual.  You feel weak or dizzy.  You feel very thirsty.  You have urinated only a small amount of very dark urine over 6-8 hours. Get help right away if:  You have symptoms of severe dehydration.  You cannot drink fluids without vomiting.  Your symptoms get worse with treatment.  You have a fever.  You have a severe headache.  You have vomiting or diarrhea that:  Gets worse.  Does not go away.  You have blood or green matter  (bile) in your vomit.  You have blood in your stool. This may cause stool to look black and tarry.  You have not urinated in 6-8 hours.  You faint.  Your heart rate while sitting still is over 100 beats a minute.  You have trouble breathing. This information is not intended to replace advice given to you by your health care provider. Make sure you discuss any questions you have with your health care provider. Document Released: 04/28/2005 Document Revised: 11/23/2015 Document Reviewed: 06/22/2015 Elsevier Interactive Patient Education  2017 Elsevier Inc.  

## 2016-07-31 ENCOUNTER — Ambulatory Visit: Payer: BLUE CROSS/BLUE SHIELD | Admitting: Nutrition

## 2016-07-31 ENCOUNTER — Ambulatory Visit (HOSPITAL_BASED_OUTPATIENT_CLINIC_OR_DEPARTMENT_OTHER): Payer: BLUE CROSS/BLUE SHIELD

## 2016-07-31 VITALS — BP 129/78 | HR 71 | Temp 97.9°F | Resp 18

## 2016-07-31 DIAGNOSIS — C099 Malignant neoplasm of tonsil, unspecified: Secondary | ICD-10-CM | POA: Diagnosis not present

## 2016-07-31 DIAGNOSIS — E860 Dehydration: Secondary | ICD-10-CM

## 2016-07-31 MED ORDER — HEPARIN SOD (PORK) LOCK FLUSH 100 UNIT/ML IV SOLN
500.0000 [IU] | Freq: Once | INTRAVENOUS | Status: AC | PRN
  Administered 2016-07-31: 500 [IU]
  Filled 2016-07-31: qty 5

## 2016-07-31 MED ORDER — SODIUM CHLORIDE 0.9 % IV SOLN
Freq: Once | INTRAVENOUS | Status: AC
  Administered 2016-07-31: 12:00:00 via INTRAVENOUS

## 2016-07-31 MED ORDER — SODIUM CHLORIDE 0.9% FLUSH
10.0000 mL | INTRAVENOUS | Status: DC | PRN
Start: 2016-07-31 — End: 2016-07-31
  Administered 2016-07-31: 10 mL
  Filled 2016-07-31: qty 10

## 2016-07-31 NOTE — Progress Notes (Signed)
Nutrition follow-up completed with patient during IV fluids. Weight is improving and documented as 167.6 pounds March 20. Patient reports he has begun drinking 2 cans boost plus and eating soft foods such as macaroni and cheese, eggs and macaroni salad. Patient continues to use 5 cans of Osmolite 1.5 via feeding tube. Reports taste alterations are improving.  Nutrition diagnosis: Inadequate oral intake improved. Severe malnutrition improved.  Intervention: Reviewed soft foods patient could consider adding into his oral intake. Educated patient to continue tube feeding until oral intake is adequate. Questions were answered.  Teach back method used.  Monitoring, evaluation, goals: Patient will work to increase oral intake so tube feeding can continue to decrease.  Next visit: To be scheduled as needed.  **Disclaimer: This note was dictated with voice recognition software. Similar sounding words can inadvertently be transcribed and this note may contain transcription errors which may not have been corrected upon publication of note.**

## 2016-07-31 NOTE — Patient Instructions (Signed)
Dehydration, Adult Dehydration is a condition in which there is not enough fluid or water in the body. This happens when you lose more fluids than you take in. Important organs, such as the kidneys, brain, and heart, cannot function without a proper amount of fluids. Any loss of fluids from the body can lead to dehydration. Dehydration can range from mild to severe. This condition should be treated right away to prevent it from becoming severe. What are the causes? This condition may be caused by:  Vomiting.  Diarrhea.  Excessive sweating, such as from heat exposure or exercise.  Not drinking enough fluid, especially:  When ill.  While doing activity that requires a lot of energy.  Excessive urination.  Fever.  Infection.  Certain medicines, such as medicines that cause the body to lose excess fluid (diuretics).  Inability to access safe drinking water.  Reduced physical ability to get adequate water and food. What increases the risk? This condition is more likely to develop in people:  Who have a poorly controlled long-term (chronic) illness, such as diabetes, heart disease, or kidney disease.  Who are age 65 or older.  Who are disabled.  Who live in a place with high altitude.  Who play endurance sports. What are the signs or symptoms? Symptoms of mild dehydration may include:   Thirst.  Dry lips.  Slightly dry mouth.  Dry, warm skin.  Dizziness. Symptoms of moderate dehydration may include:   Very dry mouth.  Muscle cramps.  Dark urine. Urine may be the color of tea.  Decreased urine production.  Decreased tear production.  Heartbeat that is irregular or faster than normal (palpitations).  Headache.  Light-headedness, especially when you stand up from a sitting position.  Fainting (syncope). Symptoms of severe dehydration may include:   Changes in skin, such as:  Cold and clammy skin.  Blotchy (mottled) or pale skin.  Skin that does  not quickly return to normal after being lightly pinched and released (poor skin turgor).  Changes in body fluids, such as:  Extreme thirst.  No tear production.  Inability to sweat when body temperature is high, such as in hot weather.  Very little urine production.  Changes in vital signs, such as:  Weak pulse.  Pulse that is more than 100 beats a minute when sitting still.  Rapid breathing.  Low blood pressure.  Other changes, such as:  Sunken eyes.  Cold hands and feet.  Confusion.  Lack of energy (lethargy).  Difficulty waking up from sleep.  Short-term weight loss.  Unconsciousness. How is this diagnosed? This condition is diagnosed based on your symptoms and a physical exam. Blood and urine tests may be done to help confirm the diagnosis. How is this treated? Treatment for this condition depends on the severity. Mild or moderate dehydration can often be treated at home. Treatment should be started right away. Do not wait until dehydration becomes severe. Severe dehydration is an emergency and it needs to be treated in a hospital. Treatment for mild dehydration may include:   Drinking more fluids.  Replacing salts and minerals in your blood (electrolytes) that you may have lost. Treatment for moderate dehydration may include:   Drinking an oral rehydration solution (ORS). This is a drink that helps you replace fluids and electrolytes (rehydrate). It can be found at pharmacies and retail stores. Treatment for severe dehydration may include:   Receiving fluids through an IV tube.  Receiving an electrolyte solution through a feeding tube that is   passed through your nose and into your stomach (nasogastric tube, or NG tube).  Correcting any abnormalities in electrolytes.  Treating the underlying cause of dehydration. Follow these instructions at home:  If directed by your health care provider, drink an ORS:  Make an ORS by following instructions on the  package.  Start by drinking small amounts, about  cup (120 mL) every 5-10 minutes.  Slowly increase how much you drink until you have taken the amount recommended by your health care provider.  Drink enough clear fluid to keep your urine clear or pale yellow. If you were told to drink an ORS, finish the ORS first, then start slowly drinking other clear fluids. Drink fluids such as:  Water. Do not drink only water. Doing that can lead to having too little salt (sodium) in the body (hyponatremia).  Ice chips.  Fruit juice that you have added water to (diluted fruit juice).  Low-calorie sports drinks.  Avoid:  Alcohol.  Drinks that contain a lot of sugar. These include high-calorie sports drinks, fruit juice that is not diluted, and soda.  Caffeine.  Foods that are greasy or contain a lot of fat or sugar.  Take over-the-counter and prescription medicines only as told by your health care provider.  Do not take sodium tablets. This can lead to having too much sodium in the body (hypernatremia).  Eat foods that contain a healthy balance of electrolytes, such as bananas, oranges, potatoes, tomatoes, and spinach.  Keep all follow-up visits as told by your health care provider. This is important. Contact a health care provider if:  You have abdominal pain that:  Gets worse.  Stays in one area (localizes).  You have a rash.  You have a stiff neck.  You are more irritable than usual.  You are sleepier or more difficult to wake up than usual.  You feel weak or dizzy.  You feel very thirsty.  You have urinated only a small amount of very dark urine over 6-8 hours. Get help right away if:  You have symptoms of severe dehydration.  You cannot drink fluids without vomiting.  Your symptoms get worse with treatment.  You have a fever.  You have a severe headache.  You have vomiting or diarrhea that:  Gets worse.  Does not go away.  You have blood or green matter  (bile) in your vomit.  You have blood in your stool. This may cause stool to look black and tarry.  You have not urinated in 6-8 hours.  You faint.  Your heart rate while sitting still is over 100 beats a minute.  You have trouble breathing. This information is not intended to replace advice given to you by your health care provider. Make sure you discuss any questions you have with your health care provider. Document Released: 04/28/2005 Document Revised: 11/23/2015 Document Reviewed: 06/22/2015 Elsevier Interactive Patient Education  2017 Elsevier Inc.  

## 2016-08-02 ENCOUNTER — Ambulatory Visit: Payer: Self-pay

## 2016-08-02 ENCOUNTER — Ambulatory Visit (HOSPITAL_BASED_OUTPATIENT_CLINIC_OR_DEPARTMENT_OTHER): Payer: BLUE CROSS/BLUE SHIELD

## 2016-08-02 VITALS — BP 130/76 | HR 64 | Temp 98.2°F | Resp 16

## 2016-08-02 DIAGNOSIS — C801 Malignant (primary) neoplasm, unspecified: Secondary | ICD-10-CM | POA: Diagnosis not present

## 2016-08-02 DIAGNOSIS — C099 Malignant neoplasm of tonsil, unspecified: Secondary | ICD-10-CM | POA: Diagnosis not present

## 2016-08-02 DIAGNOSIS — C029 Malignant neoplasm of tongue, unspecified: Secondary | ICD-10-CM | POA: Diagnosis not present

## 2016-08-02 DIAGNOSIS — C090 Malignant neoplasm of tonsillar fossa: Secondary | ICD-10-CM | POA: Diagnosis not present

## 2016-08-02 MED ORDER — SODIUM CHLORIDE 0.9% FLUSH
10.0000 mL | INTRAVENOUS | Status: DC | PRN
  Administered 2016-08-02: 10 mL
  Filled 2016-08-02: qty 10

## 2016-08-02 MED ORDER — SODIUM CHLORIDE 0.9 % IV SOLN
Freq: Once | INTRAVENOUS | Status: AC
  Administered 2016-08-02: 09:00:00 via INTRAVENOUS

## 2016-08-02 MED ORDER — HEPARIN SOD (PORK) LOCK FLUSH 100 UNIT/ML IV SOLN
500.0000 [IU] | Freq: Once | INTRAVENOUS | Status: AC | PRN
  Administered 2016-08-02: 500 [IU]
  Filled 2016-08-02: qty 5

## 2016-08-02 NOTE — Patient Instructions (Signed)
Dehydration, Adult Dehydration is a condition in which there is not enough fluid or water in the body. This happens when you lose more fluids than you take in. Important organs, such as the kidneys, brain, and heart, cannot function without a proper amount of fluids. Any loss of fluids from the body can lead to dehydration. Dehydration can range from mild to severe. This condition should be treated right away to prevent it from becoming severe. What are the causes? This condition may be caused by:  Vomiting.  Diarrhea.  Excessive sweating, such as from heat exposure or exercise.  Not drinking enough fluid, especially:  When ill.  While doing activity that requires a lot of energy.  Excessive urination.  Fever.  Infection.  Certain medicines, such as medicines that cause the body to lose excess fluid (diuretics).  Inability to access safe drinking water.  Reduced physical ability to get adequate water and food. What increases the risk? This condition is more likely to develop in people:  Who have a poorly controlled long-term (chronic) illness, such as diabetes, heart disease, or kidney disease.  Who are age 65 or older.  Who are disabled.  Who live in a place with high altitude.  Who play endurance sports. What are the signs or symptoms? Symptoms of mild dehydration may include:   Thirst.  Dry lips.  Slightly dry mouth.  Dry, warm skin.  Dizziness. Symptoms of moderate dehydration may include:   Very dry mouth.  Muscle cramps.  Dark urine. Urine may be the color of tea.  Decreased urine production.  Decreased tear production.  Heartbeat that is irregular or faster than normal (palpitations).  Headache.  Light-headedness, especially when you stand up from a sitting position.  Fainting (syncope). Symptoms of severe dehydration may include:   Changes in skin, such as:  Cold and clammy skin.  Blotchy (mottled) or pale skin.  Skin that does  not quickly return to normal after being lightly pinched and released (poor skin turgor).  Changes in body fluids, such as:  Extreme thirst.  No tear production.  Inability to sweat when body temperature is high, such as in hot weather.  Very little urine production.  Changes in vital signs, such as:  Weak pulse.  Pulse that is more than 100 beats a minute when sitting still.  Rapid breathing.  Low blood pressure.  Other changes, such as:  Sunken eyes.  Cold hands and feet.  Confusion.  Lack of energy (lethargy).  Difficulty waking up from sleep.  Short-term weight loss.  Unconsciousness. How is this diagnosed? This condition is diagnosed based on your symptoms and a physical exam. Blood and urine tests may be done to help confirm the diagnosis. How is this treated? Treatment for this condition depends on the severity. Mild or moderate dehydration can often be treated at home. Treatment should be started right away. Do not wait until dehydration becomes severe. Severe dehydration is an emergency and it needs to be treated in a hospital. Treatment for mild dehydration may include:   Drinking more fluids.  Replacing salts and minerals in your blood (electrolytes) that you may have lost. Treatment for moderate dehydration may include:   Drinking an oral rehydration solution (ORS). This is a drink that helps you replace fluids and electrolytes (rehydrate). It can be found at pharmacies and retail stores. Treatment for severe dehydration may include:   Receiving fluids through an IV tube.  Receiving an electrolyte solution through a feeding tube that is   passed through your nose and into your stomach (nasogastric tube, or NG tube).  Correcting any abnormalities in electrolytes.  Treating the underlying cause of dehydration. Follow these instructions at home:  If directed by your health care provider, drink an ORS:  Make an ORS by following instructions on the  package.  Start by drinking small amounts, about  cup (120 mL) every 5-10 minutes.  Slowly increase how much you drink until you have taken the amount recommended by your health care provider.  Drink enough clear fluid to keep your urine clear or pale yellow. If you were told to drink an ORS, finish the ORS first, then start slowly drinking other clear fluids. Drink fluids such as:  Water. Do not drink only water. Doing that can lead to having too little salt (sodium) in the body (hyponatremia).  Ice chips.  Fruit juice that you have added water to (diluted fruit juice).  Low-calorie sports drinks.  Avoid:  Alcohol.  Drinks that contain a lot of sugar. These include high-calorie sports drinks, fruit juice that is not diluted, and soda.  Caffeine.  Foods that are greasy or contain a lot of fat or sugar.  Take over-the-counter and prescription medicines only as told by your health care provider.  Do not take sodium tablets. This can lead to having too much sodium in the body (hypernatremia).  Eat foods that contain a healthy balance of electrolytes, such as bananas, oranges, potatoes, tomatoes, and spinach.  Keep all follow-up visits as told by your health care provider. This is important. Contact a health care provider if:  You have abdominal pain that:  Gets worse.  Stays in one area (localizes).  You have a rash.  You have a stiff neck.  You are more irritable than usual.  You are sleepier or more difficult to wake up than usual.  You feel weak or dizzy.  You feel very thirsty.  You have urinated only a small amount of very dark urine over 6-8 hours. Get help right away if:  You have symptoms of severe dehydration.  You cannot drink fluids without vomiting.  Your symptoms get worse with treatment.  You have a fever.  You have a severe headache.  You have vomiting or diarrhea that:  Gets worse.  Does not go away.  You have blood or green matter  (bile) in your vomit.  You have blood in your stool. This may cause stool to look black and tarry.  You have not urinated in 6-8 hours.  You faint.  Your heart rate while sitting still is over 100 beats a minute.  You have trouble breathing. This information is not intended to replace advice given to you by your health care provider. Make sure you discuss any questions you have with your health care provider. Document Released: 04/28/2005 Document Revised: 11/23/2015 Document Reviewed: 06/22/2015 Elsevier Interactive Patient Education  2017 Elsevier Inc.  

## 2016-08-04 ENCOUNTER — Encounter: Payer: Self-pay | Admitting: Radiation Oncology

## 2016-08-04 NOTE — Progress Notes (Signed)
Paperwork ([employer]) received and faxed to [phone], conf received 08/01/16, copy mailed to patient

## 2016-08-05 ENCOUNTER — Ambulatory Visit (HOSPITAL_BASED_OUTPATIENT_CLINIC_OR_DEPARTMENT_OTHER): Payer: BLUE CROSS/BLUE SHIELD

## 2016-08-05 ENCOUNTER — Telehealth: Payer: Self-pay | Admitting: Hematology and Oncology

## 2016-08-05 ENCOUNTER — Encounter: Payer: Self-pay | Admitting: Hematology and Oncology

## 2016-08-05 ENCOUNTER — Other Ambulatory Visit (HOSPITAL_BASED_OUTPATIENT_CLINIC_OR_DEPARTMENT_OTHER): Payer: BLUE CROSS/BLUE SHIELD

## 2016-08-05 ENCOUNTER — Ambulatory Visit (HOSPITAL_BASED_OUTPATIENT_CLINIC_OR_DEPARTMENT_OTHER): Payer: BLUE CROSS/BLUE SHIELD | Admitting: Hematology and Oncology

## 2016-08-05 VITALS — BP 123/80 | HR 58

## 2016-08-05 DIAGNOSIS — C099 Malignant neoplasm of tonsil, unspecified: Secondary | ICD-10-CM

## 2016-08-05 DIAGNOSIS — D61818 Other pancytopenia: Secondary | ICD-10-CM

## 2016-08-05 LAB — CBC WITH DIFFERENTIAL/PLATELET
BASO%: 1 % (ref 0.0–2.0)
BASOS ABS: 0 10*3/uL (ref 0.0–0.1)
EOS%: 1.9 % (ref 0.0–7.0)
Eosinophils Absolute: 0.1 10*3/uL (ref 0.0–0.5)
HCT: 31.6 % — ABNORMAL LOW (ref 38.4–49.9)
HGB: 10.9 g/dL — ABNORMAL LOW (ref 13.0–17.1)
LYMPH%: 10.6 % — AB (ref 14.0–49.0)
MCH: 30.5 pg (ref 27.2–33.4)
MCHC: 34.5 g/dL (ref 32.0–36.0)
MCV: 88.4 fL (ref 79.3–98.0)
MONO#: 0.4 10*3/uL (ref 0.1–0.9)
MONO%: 11.6 % (ref 0.0–14.0)
NEUT#: 2.5 10*3/uL (ref 1.5–6.5)
NEUT%: 74.9 % (ref 39.0–75.0)
PLATELETS: 303 10*3/uL (ref 140–400)
RBC: 3.57 10*6/uL — AB (ref 4.20–5.82)
RDW: 13.9 % (ref 11.0–14.6)
WBC: 3.3 10*3/uL — ABNORMAL LOW (ref 4.0–10.3)
lymph#: 0.4 10*3/uL — ABNORMAL LOW (ref 0.9–3.3)

## 2016-08-05 LAB — COMPREHENSIVE METABOLIC PANEL
ALK PHOS: 45 U/L (ref 40–150)
ALT: 19 U/L (ref 0–55)
AST: 16 U/L (ref 5–34)
Albumin: 4.2 g/dL (ref 3.5–5.0)
Anion Gap: 9 mEq/L (ref 3–11)
BILIRUBIN TOTAL: 0.3 mg/dL (ref 0.20–1.20)
BUN: 22.4 mg/dL (ref 7.0–26.0)
CO2: 31 mEq/L — ABNORMAL HIGH (ref 22–29)
Calcium: 10.3 mg/dL (ref 8.4–10.4)
Chloride: 100 mEq/L (ref 98–109)
Creatinine: 1.3 mg/dL (ref 0.7–1.3)
EGFR: 64 mL/min/{1.73_m2} — ABNORMAL LOW (ref >90)
GLUCOSE: 111 mg/dL (ref 70–140)
Potassium: 4.2 mEq/L (ref 3.5–5.1)
SODIUM: 140 meq/L (ref 136–145)
TOTAL PROTEIN: 7.1 g/dL (ref 6.4–8.3)

## 2016-08-05 MED ORDER — HEPARIN SOD (PORK) LOCK FLUSH 100 UNIT/ML IV SOLN
500.0000 [IU] | Freq: Once | INTRAVENOUS | Status: AC | PRN
  Administered 2016-08-05: 500 [IU]
  Filled 2016-08-05: qty 5

## 2016-08-05 MED ORDER — SODIUM CHLORIDE 0.9% FLUSH
10.0000 mL | INTRAVENOUS | Status: DC | PRN
  Administered 2016-08-05: 10 mL
  Filled 2016-08-05: qty 10

## 2016-08-05 MED ORDER — SODIUM CHLORIDE 0.9 % IV SOLN
Freq: Once | INTRAVENOUS | Status: AC
  Administered 2016-08-05: 11:00:00 via INTRAVENOUS

## 2016-08-05 MED FILL — METOCLOPRAMIDE 5 MG/5 ML SY: 5 | 6 days supply | Qty: 240 | Fill #8

## 2016-08-05 NOTE — Assessment & Plan Note (Signed)
He has almost completely recovered from all side effects of treatment. We would discontinue IV fluids soon I have given him a letter to release him back to work I reinforced importance of close ENT follow-up.  He will make appointment to schedule return visit to see his ENT doctor He has PET CT scan scheduled for next month If PET CT scan show complete resolution of disease We will get the port and the feeding tube removed I will see him back in July for further review and blood work

## 2016-08-05 NOTE — Patient Instructions (Signed)
Dehydration, Adult Dehydration is a condition in which there is not enough fluid or water in the body. This happens when you lose more fluids than you take in. Important organs, such as the kidneys, brain, and heart, cannot function without a proper amount of fluids. Any loss of fluids from the body can lead to dehydration. Dehydration can range from mild to severe. This condition should be treated right away to prevent it from becoming severe. What are the causes? This condition may be caused by:  Vomiting.  Diarrhea.  Excessive sweating, such as from heat exposure or exercise.  Not drinking enough fluid, especially:  When ill.  While doing activity that requires a lot of energy.  Excessive urination.  Fever.  Infection.  Certain medicines, such as medicines that cause the body to lose excess fluid (diuretics).  Inability to access safe drinking water.  Reduced physical ability to get adequate water and food. What increases the risk? This condition is more likely to develop in people:  Who have a poorly controlled long-term (chronic) illness, such as diabetes, heart disease, or kidney disease.  Who are age 65 or older.  Who are disabled.  Who live in a place with high altitude.  Who play endurance sports. What are the signs or symptoms? Symptoms of mild dehydration may include:   Thirst.  Dry lips.  Slightly dry mouth.  Dry, warm skin.  Dizziness. Symptoms of moderate dehydration may include:   Very dry mouth.  Muscle cramps.  Dark urine. Urine may be the color of tea.  Decreased urine production.  Decreased tear production.  Heartbeat that is irregular or faster than normal (palpitations).  Headache.  Light-headedness, especially when you stand up from a sitting position.  Fainting (syncope). Symptoms of severe dehydration may include:   Changes in skin, such as:  Cold and clammy skin.  Blotchy (mottled) or pale skin.  Skin that does  not quickly return to normal after being lightly pinched and released (poor skin turgor).  Changes in body fluids, such as:  Extreme thirst.  No tear production.  Inability to sweat when body temperature is high, such as in hot weather.  Very little urine production.  Changes in vital signs, such as:  Weak pulse.  Pulse that is more than 100 beats a minute when sitting still.  Rapid breathing.  Low blood pressure.  Other changes, such as:  Sunken eyes.  Cold hands and feet.  Confusion.  Lack of energy (lethargy).  Difficulty waking up from sleep.  Short-term weight loss.  Unconsciousness. How is this diagnosed? This condition is diagnosed based on your symptoms and a physical exam. Blood and urine tests may be done to help confirm the diagnosis. How is this treated? Treatment for this condition depends on the severity. Mild or moderate dehydration can often be treated at home. Treatment should be started right away. Do not wait until dehydration becomes severe. Severe dehydration is an emergency and it needs to be treated in a hospital. Treatment for mild dehydration may include:   Drinking more fluids.  Replacing salts and minerals in your blood (electrolytes) that you may have lost. Treatment for moderate dehydration may include:   Drinking an oral rehydration solution (ORS). This is a drink that helps you replace fluids and electrolytes (rehydrate). It can be found at pharmacies and retail stores. Treatment for severe dehydration may include:   Receiving fluids through an IV tube.  Receiving an electrolyte solution through a feeding tube that is   passed through your nose and into your stomach (nasogastric tube, or NG tube).  Correcting any abnormalities in electrolytes.  Treating the underlying cause of dehydration. Follow these instructions at home:  If directed by your health care provider, drink an ORS:  Make an ORS by following instructions on the  package.  Start by drinking small amounts, about  cup (120 mL) every 5-10 minutes.  Slowly increase how much you drink until you have taken the amount recommended by your health care provider.  Drink enough clear fluid to keep your urine clear or pale yellow. If you were told to drink an ORS, finish the ORS first, then start slowly drinking other clear fluids. Drink fluids such as:  Water. Do not drink only water. Doing that can lead to having too little salt (sodium) in the body (hyponatremia).  Ice chips.  Fruit juice that you have added water to (diluted fruit juice).  Low-calorie sports drinks.  Avoid:  Alcohol.  Drinks that contain a lot of sugar. These include high-calorie sports drinks, fruit juice that is not diluted, and soda.  Caffeine.  Foods that are greasy or contain a lot of fat or sugar.  Take over-the-counter and prescription medicines only as told by your health care provider.  Do not take sodium tablets. This can lead to having too much sodium in the body (hypernatremia).  Eat foods that contain a healthy balance of electrolytes, such as bananas, oranges, potatoes, tomatoes, and spinach.  Keep all follow-up visits as told by your health care provider. This is important. Contact a health care provider if:  You have abdominal pain that:  Gets worse.  Stays in one area (localizes).  You have a rash.  You have a stiff neck.  You are more irritable than usual.  You are sleepier or more difficult to wake up than usual.  You feel weak or dizzy.  You feel very thirsty.  You have urinated only a small amount of very dark urine over 6-8 hours. Get help right away if:  You have symptoms of severe dehydration.  You cannot drink fluids without vomiting.  Your symptoms get worse with treatment.  You have a fever.  You have a severe headache.  You have vomiting or diarrhea that:  Gets worse.  Does not go away.  You have blood or green matter  (bile) in your vomit.  You have blood in your stool. This may cause stool to look black and tarry.  You have not urinated in 6-8 hours.  You faint.  Your heart rate while sitting still is over 100 beats a minute.  You have trouble breathing. This information is not intended to replace advice given to you by your health care provider. Make sure you discuss any questions you have with your health care provider. Document Released: 04/28/2005 Document Revised: 11/23/2015 Document Reviewed: 06/22/2015 Elsevier Interactive Patient Education  2017 Elsevier Inc.  

## 2016-08-05 NOTE — Telephone Encounter (Signed)
Appointments complete per 3/27 los. Patient to get print out in infusion area.

## 2016-08-05 NOTE — Assessment & Plan Note (Signed)
The patient had persistent pancytopenia It is stable and anemia is improving For now, he is not symptomatic and does not require blood transfusion for anemia or antibiotics for mild leukopenia I plan to recheck his blood work again in July.

## 2016-08-05 NOTE — Telephone Encounter (Signed)
Faxed disability paperwork to [facility] fax# [phone]

## 2016-08-05 NOTE — Progress Notes (Signed)
[place] OFFICE PROGRESS NOTE  Patient Care Team: Haywood Pao, MD as PCP - General (Internal Medicine) Leota Sauers, RN as Oncology Nurse Navigator Eppie Gibson, MD as Attending Physician (Radiation Oncology) Heath Lark, MD as Consulting Physician (Hematology and Oncology) Karie Mainland, RD as Dietitian (Nutrition)  SUMMARY OF ONCOLOGIC HISTORY:   Tonsil cancer ([place])   02/07/2016 Imaging    Ct neck showed abnormal soft tissue within the left pharynx, extending from the left tonsillar pillar inferiorly to the level of the hyoid and filling the left vallecula. The appearance is most consistent with a neoplastic process. 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 Histologic sampling and direct visualization should be considered. Confluent adenopathy within the left cervical chain with areas of central hypoattenuation suggesting degrees of necrosis. This is most suggestive of metastatic spread. The areas of necrosis within the adenopathy combined with the above-described pharyngeal soft tissue mass are together suggestive of squamous cell carcinoma.      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 Borderline hypermetabolic small right station IIb lymph node. Several tiny lung nodules are observed, these are not hypermetabolic but are also below the threshold level for accurate characterization. Dedicated CT chest may be warranted to act as a baseline.      03/20/2016 Procedure    Placement of a right internal jugular approach power injectable Port-A-Cath. Fluoroscopic insertion of a 20-French pull-through gastrostomy tube.       03/24/2016 - 05/06/2016 Chemotherapy    He received high dose cisplatin; dose 2 and 3 with dose reduction due to side-effects      03/24/2016 - 05/15/2016 Radiation Therapy    He received concurrent radiation       INTERVAL HISTORY: Please see below for problem oriented charting. He returns for further follow-up He is gaining weight He is able to drink adequately by mouth He has started to experience various textures of food He is still dependent on feeding tube using 5 cans of nutritional supplement daily at night He denies dysphagia Denies recent infection  REVIEW OF SYSTEMS:   Constitutional: Denies fevers, chills or abnormal weight loss Eyes: Denies blurriness of vision Ears, nose, mouth, throat, and face: Denies mucositis or sore throat Respiratory: Denies cough, dyspnea or wheezes Cardiovascular: Denies palpitation, chest discomfort or lower extremity swelling Gastrointestinal:  Denies nausea, heartburn or change in bowel habits Skin: Denies abnormal skin rashes Lymphatics: Denies new lymphadenopathy or easy bruising Neurological:Denies numbness, tingling or new weaknesses Behavioral/Psych: Mood is stable, no new changes  All other systems were reviewed with the patient and are negative.  I have reviewed the past medical history, past surgical history, social history and family history with the patient and they are unchanged from previous note.  ALLERGIES:  is allergic to phenergan [promethazine hcl].  MEDICATIONS:  Current Outpatient Prescriptions  Medication Sig Dispense Refill  . fenofibrate (TRICOR) 145 MG tablet Take 145 mg by mouth daily.     [name] lactulose (CHRONULAC) 10 GM/15ML solution Place 15 mLs (10 g total) into feeding tube 3 (three) times daily. 240 mL 0  . levothyroxine (SYNTHROID, LEVOTHROID) 50 MCG tablet  Take 50 mcg by mouth daily before breakfast.     . lidocaine-prilocaine (EMLA) cream Apply to affected area once (Patient not taking: Reported on  08/05/2016) 30 g 3  . Nutritional Supplements (FEEDING SUPPLEMENT, OSMOLITE 1.5 CAL,) LIQD Run Osmolite 1.5 at 100 ML/hr for 12 hours via PEG. Give one can BID daily with 120 cc free water before and after bolus feedings and continuous feeding. (Patient not taking: Reported on 08/05/2016) 1659 mL 0  . sodium fluoride (FLUORISHIELD) 1.1 % GEL dental gel Instill one drop of gel per tooth space of fluoride tray. Place over teeth for 5 minutes. Remove. Spit out excess. Repeat nightly. (Patient not taking: Reported on 08/05/2016) 120 mL prn   No current facility-administered medications for this visit.     PHYSICAL EXAMINATION: ECOG PERFORMANCE STATUS: 0 - Asymptomatic  Vitals:   08/05/16 1005  BP: 127/75  Pulse: (!) 57  Resp: 17  Temp: 97.7 F (36.5 C)   Filed Weights   08/05/16 1005  Weight: 172 lb 1.6 oz (78.1 kg)    GENERAL:alert, no distress and comfortable SKIN: skin color, texture, turgor are normal, no rashes or significant lesions EYES: normal, Conjunctiva are pink and non-injected, sclera clear OROPHARYNX:no exudate, no erythema and lips, buccal mucosa, and tongue normal  NECK: supple, thyroid normal size, non-tender, without nodularity.  Noted trace lymphedema LYMPH:  no palpable lymphadenopathy in the cervical, axillary or inguinal LUNGS: clear to auscultation and percussion with normal breathing effort HEART: regular rate & rhythm and no murmurs and no lower extremity edema ABDOMEN:abdomen soft, non-tender and normal bowel sounds Musculoskeletal:no cyanosis of digits and no clubbing  NEURO: alert & oriented x 3 with fluent speech, no focal motor/sensory deficits  LABORATORY DATA:  I have reviewed the data as listed    Component Value Date/Time   NA 140 08/05/2016 0953   K 4.2 08/05/2016 0953   CL 96 (L) 05/11/2016 1608   CO2 31 (H) 08/05/2016 0953   GLUCOSE 111 08/05/2016 0953   BUN 22.4 08/05/2016 0953   CREATININE 1.3 08/05/2016 0953   CALCIUM 10.3 08/05/2016 0953    PROT 7.1 08/05/2016 0953   ALBUMIN 4.2 08/05/2016 0953   AST 16 08/05/2016 0953   ALT 19 08/05/2016 0953   ALKPHOS 45 08/05/2016 0953   BILITOT 0.30 08/05/2016 0953   GFRNONAA >60 05/11/2016 1608   GFRAA >60 05/11/2016 1608    No results found for: SPEP, UPEP  Lab Results  Component Value Date   WBC 3.3 (L) 08/05/2016   NEUTROABS 2.5 08/05/2016   HGB 10.9 (L) 08/05/2016   HCT 31.6 (L) 08/05/2016   MCV 88.4 08/05/2016   PLT 303 08/05/2016      Chemistry      Component Value Date/Time   NA 140 08/05/2016 0953   K 4.2 08/05/2016 0953   CL 96 (L) 05/11/2016 1608   CO2 31 (H) 08/05/2016 0953   BUN 22.4 08/05/2016 0953   CREATININE 1.3 08/05/2016 0953      Component Value Date/Time   CALCIUM 10.3 08/05/2016 0953   ALKPHOS 45 08/05/2016 0953   AST 16 08/05/2016 0953   ALT 19 08/05/2016 0953   BILITOT 0.30 08/05/2016 0953       ASSESSMENT & PLAN:  Tonsil cancer ([place]) He has almost completely recovered from all side effects of treatment. We would discontinue IV fluids soon I have given him a letter to release him back to work I reinforced importance of close ENT follow-up.  He will make appointment to schedule return visit to see his ENT doctor He has PET CT scan scheduled for next month If PET CT scan show complete resolution of disease We will get the port and the feeding tube removed I will see him back in July for further review and blood work  Pancytopenia, acquired [facility]) The patient had persistent pancytopenia It is stable and anemia is improving For now, he is not symptomatic and does not require blood transfusion for anemia or antibiotics for mild leukopenia I plan to recheck his blood work again in July.   No orders of the defined types were placed in this encounter.  All questions were answered. The patient knows to call the clinic with any problems, questions or concerns. No barriers to learning was detected. I spent 15 minutes counseling the  patient face to face. The total time spent in the appointment was 20 minutes and more than 50% was on counseling and review of test results     Heath Lark, MD 08/05/2016 10:41 AM

## 2016-08-06 ENCOUNTER — Ambulatory Visit: Payer: BLUE CROSS/BLUE SHIELD | Attending: Hematology and Oncology

## 2016-08-06 DIAGNOSIS — R1310 Dysphagia, unspecified: Secondary | ICD-10-CM | POA: Diagnosis not present

## 2016-08-06 NOTE — Patient Instructions (Addendum)
Signs of Aspiration Pneumonia   . Chest pain/tightness . Fever (can be low grade) . Cough  o With foul-smelling phlegm (sputum) o With sputum containing pus or blood o With greenish sputum . Fatigue  . Shortness of breath  . Wheezing   **IF YOU HAVE THESE SIGNS, CONTACT YOUR DOCTOR OR GO TO THE EMERGENCY DEPARTMENT OR URGENT CARE AS SOON AS POSSIBLE**    ============================================================== YOU MUST DO THE EXERCISES TO GIVE YOUR SELF THE BEST POSSIBLE OUTCOME OPPORTUNITY!

## 2016-08-07 ENCOUNTER — Ambulatory Visit (HOSPITAL_BASED_OUTPATIENT_CLINIC_OR_DEPARTMENT_OTHER): Payer: BLUE CROSS/BLUE SHIELD

## 2016-08-07 VITALS — BP 125/75 | HR 66 | Temp 98.3°F | Resp 18

## 2016-08-07 DIAGNOSIS — C099 Malignant neoplasm of tonsil, unspecified: Secondary | ICD-10-CM

## 2016-08-07 MED ORDER — SODIUM CHLORIDE 0.9 % IV SOLN
Freq: Once | INTRAVENOUS | Status: AC
  Administered 2016-08-07: 11:00:00 via INTRAVENOUS

## 2016-08-07 MED ORDER — SODIUM CHLORIDE 0.9% FLUSH
10.0000 mL | INTRAVENOUS | Status: DC | PRN
  Administered 2016-08-07: 10 mL
  Filled 2016-08-07: qty 10

## 2016-08-07 MED ORDER — HEPARIN SOD (PORK) LOCK FLUSH 100 UNIT/ML IV SOLN
500.0000 [IU] | Freq: Once | INTRAVENOUS | Status: AC | PRN
  Administered 2016-08-07: 500 [IU]
  Filled 2016-08-07: qty 5

## 2016-08-07 NOTE — Patient Instructions (Signed)
Dehydration, Adult Dehydration is a condition in which there is not enough fluid or water in the body. This happens when you lose more fluids than you take in. Important organs, such as the kidneys, brain, and heart, cannot function without a proper amount of fluids. Any loss of fluids from the body can lead to dehydration. Dehydration can range from mild to severe. This condition should be treated right away to prevent it from becoming severe. What are the causes? This condition may be caused by:  Vomiting.  Diarrhea.  Excessive sweating, such as from heat exposure or exercise.  Not drinking enough fluid, especially:  When ill.  While doing activity that requires a lot of energy.  Excessive urination.  Fever.  Infection.  Certain medicines, such as medicines that cause the body to lose excess fluid (diuretics).  Inability to access safe drinking water.  Reduced physical ability to get adequate water and food. What increases the risk? This condition is more likely to develop in people:  Who have a poorly controlled long-term (chronic) illness, such as diabetes, heart disease, or kidney disease.  Who are age 65 or older.  Who are disabled.  Who live in a place with high altitude.  Who play endurance sports. What are the signs or symptoms? Symptoms of mild dehydration may include:   Thirst.  Dry lips.  Slightly dry mouth.  Dry, warm skin.  Dizziness. Symptoms of moderate dehydration may include:   Very dry mouth.  Muscle cramps.  Dark urine. Urine may be the color of tea.  Decreased urine production.  Decreased tear production.  Heartbeat that is irregular or faster than normal (palpitations).  Headache.  Light-headedness, especially when you stand up from a sitting position.  Fainting (syncope). Symptoms of severe dehydration may include:   Changes in skin, such as:  Cold and clammy skin.  Blotchy (mottled) or pale skin.  Skin that does  not quickly return to normal after being lightly pinched and released (poor skin turgor).  Changes in body fluids, such as:  Extreme thirst.  No tear production.  Inability to sweat when body temperature is high, such as in hot weather.  Very little urine production.  Changes in vital signs, such as:  Weak pulse.  Pulse that is more than 100 beats a minute when sitting still.  Rapid breathing.  Low blood pressure.  Other changes, such as:  Sunken eyes.  Cold hands and feet.  Confusion.  Lack of energy (lethargy).  Difficulty waking up from sleep.  Short-term weight loss.  Unconsciousness. How is this diagnosed? This condition is diagnosed based on your symptoms and a physical exam. Blood and urine tests may be done to help confirm the diagnosis. How is this treated? Treatment for this condition depends on the severity. Mild or moderate dehydration can often be treated at home. Treatment should be started right away. Do not wait until dehydration becomes severe. Severe dehydration is an emergency and it needs to be treated in a hospital. Treatment for mild dehydration may include:   Drinking more fluids.  Replacing salts and minerals in your blood (electrolytes) that you may have lost. Treatment for moderate dehydration may include:   Drinking an oral rehydration solution (ORS). This is a drink that helps you replace fluids and electrolytes (rehydrate). It can be found at pharmacies and retail stores. Treatment for severe dehydration may include:   Receiving fluids through an IV tube.  Receiving an electrolyte solution through a feeding tube that is   passed through your nose and into your stomach (nasogastric tube, or NG tube).  Correcting any abnormalities in electrolytes.  Treating the underlying cause of dehydration. Follow these instructions at home:  If directed by your health care provider, drink an ORS:  Make an ORS by following instructions on the  package.  Start by drinking small amounts, about  cup (120 mL) every 5-10 minutes.  Slowly increase how much you drink until you have taken the amount recommended by your health care provider.  Drink enough clear fluid to keep your urine clear or pale yellow. If you were told to drink an ORS, finish the ORS first, then start slowly drinking other clear fluids. Drink fluids such as:  Water. Do not drink only water. Doing that can lead to having too little salt (sodium) in the body (hyponatremia).  Ice chips.  Fruit juice that you have added water to (diluted fruit juice).  Low-calorie sports drinks.  Avoid:  Alcohol.  Drinks that contain a lot of sugar. These include high-calorie sports drinks, fruit juice that is not diluted, and soda.  Caffeine.  Foods that are greasy or contain a lot of fat or sugar.  Take over-the-counter and prescription medicines only as told by your health care provider.  Do not take sodium tablets. This can lead to having too much sodium in the body (hypernatremia).  Eat foods that contain a healthy balance of electrolytes, such as bananas, oranges, potatoes, tomatoes, and spinach.  Keep all follow-up visits as told by your health care provider. This is important. Contact a health care provider if:  You have abdominal pain that:  Gets worse.  Stays in one area (localizes).  You have a rash.  You have a stiff neck.  You are more irritable than usual.  You are sleepier or more difficult to wake up than usual.  You feel weak or dizzy.  You feel very thirsty.  You have urinated only a small amount of very dark urine over 6-8 hours. Get help right away if:  You have symptoms of severe dehydration.  You cannot drink fluids without vomiting.  Your symptoms get worse with treatment.  You have a fever.  You have a severe headache.  You have vomiting or diarrhea that:  Gets worse.  Does not go away.  You have blood or green matter  (bile) in your vomit.  You have blood in your stool. This may cause stool to look black and tarry.  You have not urinated in 6-8 hours.  You faint.  Your heart rate while sitting still is over 100 beats a minute.  You have trouble breathing. This information is not intended to replace advice given to you by your health care provider. Make sure you discuss any questions you have with your health care provider. Document Released: 04/28/2005 Document Revised: 11/23/2015 Document Reviewed: 06/22/2015 Elsevier Interactive Patient Education  2017 Elsevier Inc.  

## 2016-08-07 NOTE — Therapy (Signed)
[facility] [address] [place], [place], [address] Phone: [phone]   Fax:  [phone]  Speech Language Pathology Treatment  Patient Details  Name: Todd [name] MRN: [id number] Date of Birth: [birth year] Referring Provider: Eppie Gibson MD  Encounter Date: 08/06/2016      End of Session - 08/07/16 1042    Visit Number 4   Number of Visits 6   Date for SLP Re-Evaluation 09/05/16   SLP Start Time 0900  PT 15 MIN LATE   SLP Stop Time  0933   SLP Time Calculation (min) 33 min   Activity Tolerance Patient tolerated treatment well      Past Medical History:  Diagnosis Date  . Arthritis   . Cancer ([place])    Tonsil Cancer  . History of radiation therapy 03/24/2016- 05/15/2016   Left Tonsil and Bilateral Neck  . Hyperlipidemia   . Hypertension   . Hypothyroidism     Past Surgical History:  Procedure Laterality Date  . IR GENERIC HISTORICAL  03/20/2016   IR US GUIDE VASC ACCESS RIGHT 03/20/2016 Sandi Mariscal, MD [facility]  . IR GENERIC HISTORICAL  03/20/2016   IR FLUORO GUIDE PORT INSERTION RIGHT 03/20/2016 Sandi Mariscal, MD [facility]  . IR GENERIC HISTORICAL  03/20/2016   IR GASTROSTOMY TUBE MOD SED 03/20/2016 Sandi Mariscal, MD [facility]    There were no vitals filed for this visit.      Subjective Assessment - 08/06/16 0903    Subjective Pt has been experimenting with soft solids. Is ingesting once/day. HEP frequency remains static.               ADULT SLP TREATMENT - 08/07/16 0001      General Information   Behavior/Cognition Alert;Cooperative;Pleasant mood     Treatment Provided   Treatment provided Dysphagia     Dysphagia Treatment   Temperature Spikes Noted No   Respiratory Status Room air   Oral Cavity - Dentition Adequate natural dentition   Treatment Methods Skilled observation;Upgraded PO texture trial;Therapeutic exercise;Patient/caregiver education   Patient observed directly  with PO's Yes   Type of PO's observed Dysphagia 3 (soft);Thin liquids   Liquids provided via Cup   Oral Phase Signs & Symptoms --  none noted   Pharyngeal Phase Signs & Symptoms Delayed throat clear   Type of cueing Verbal;Visual   Amount of cueing --  occasional   Other treatment/comments SLP provided occasional verbal and visual cues for HEP as pt frequency had not incr'd since last session. SLP reiterated reason for HEP and stressed this more heavily after plapating pt's bil submandibular region and feeling what appeared to be mild fibrotic muscle tissue. Pt with delayed throat clear with thin liquids, occasionally. Pt told SLP 3 overt s/s aspiration PNA with modified independence and how a food journal could benefit him.     Assessment / Recommendations / Plan   Plan Continue with current plan of care     Progression Toward Goals   Progression toward goals Progressing toward goals          SLP Education - 08/06/16 0934    Education provided Yes   Education Details need to do HEP daily, HEP procedure, late effects head/neck rad, aspiration PNA s/s, food journal benefits   Person(s) Educated Patient   Methods Explanation;Demonstration;Verbal cues;Handout   Comprehension Verbalized understanding;Returned demonstration;Need further instruction          SLP Short Term Goals - 08/07/16 1045  SLP SHORT TERM GOAL #1   Title pt will complete HEP with rare min A   Status Not Met     SLP SHORT TERM GOAL #2   Title pt will demo knowledge of 3 overt s/s aspiration PNA with modified indpendnece   Status Achieved     SLP SHORT TERM GOAL #3   Title pt will tell SLP how a food journal can assist in return to more normal PO diet   Status Achieved          SLP Long Term Goals - 08/07/16 [place] #1   Title pt will complete HEP with modified indpendence over 2 therapy visits   Time 2   Period --  visits   Status On-going     SLP LONG TERM GOAL #2    Title pt will tell SLP when to switch frequency of HEP from 2-3/day to x2/week   Time 2   Period --  visits   Status On-going          Plan - 08/07/16 1043    Clinical Impression Statement SLP suspects mild fibrotic tissue in bil submandibular region due to noncompliance with HEP. SLP again stressed need for daily HEP. See "other treatment/comments" and "education" above for more details. Pt would cont to benefit from skilled ST to maintain safe swallow function and mitigate danger of aspiration/aspiration PNA.   Speech Therapy Frequency --  once approx every 4 weeks   Duration --  4 visits   Treatment/Interventions Aspiration precaution training;Pharyngeal strengthening exercises;Compensatory techniques;SLP instruction and feedback;Patient/family education;Trials of upgraded texture/liquids  any and/or all may be used in ST sessions   Potential to Achieve Goals Good      Patient will benefit from skilled therapeutic intervention in order to improve the following deficits and impairments:   Dysphagia, unspecified type    Problem List Patient Active Problem List   Diagnosis Date Noted  . Dysphagia 06/13/2016  . Pancytopenia, acquired ([place]) 05/26/2016  . Protein-calorie malnutrition, moderate ([place]) 05/26/2016  . Deficiency anemia 05/26/2016  . Postprandial abdominal bloating 05/16/2016  . Redness of both eyes 05/16/2016  . Splenic lesion 05/16/2016  . Frequent epistaxis 04/28/2016  . Cough 04/21/2016  . Other constipation 04/21/2016  . Drug-induced neutropenia ([place]) 04/14/2016  . Anemia due to antineoplastic chemotherapy 04/14/2016  . Jaw pain 04/08/2016  . Chemotherapy-induced nausea 03/31/2016  . Bilateral tinnitus 03/31/2016  . Essential hypertension 03/18/2016  . Hemoptysis 02/26/2016  . Weight loss 02/26/2016  . Carcinoma of tonsillar fossa ([place]) 02/26/2016  . Tonsil cancer ([place]) 02/21/2016    [facility] ,[place], [place]  08/07/2016, 10:47 AM  [facility] [address] [place], [place], [address] Phone: [phone]   Fax:  [phone]   Name: Todd [name] MRN: [id number] Date of Birth: March 03, 1961

## 2016-08-09 ENCOUNTER — Ambulatory Visit (HOSPITAL_BASED_OUTPATIENT_CLINIC_OR_DEPARTMENT_OTHER): Payer: BLUE CROSS/BLUE SHIELD

## 2016-08-09 ENCOUNTER — Ambulatory Visit: Payer: Self-pay

## 2016-08-09 VITALS — BP 125/67 | HR 58 | Temp 98.1°F | Resp 18

## 2016-08-09 DIAGNOSIS — C099 Malignant neoplasm of tonsil, unspecified: Secondary | ICD-10-CM

## 2016-08-09 MED ORDER — SODIUM CHLORIDE 0.9% FLUSH
10.0000 mL | INTRAVENOUS | Status: DC | PRN
  Administered 2016-08-09: 10 mL
  Filled 2016-08-09: qty 10

## 2016-08-09 MED ORDER — SODIUM CHLORIDE 0.9 % IV SOLN
Freq: Once | INTRAVENOUS | Status: AC
  Administered 2016-08-09: 08:00:00 via INTRAVENOUS

## 2016-08-09 MED ORDER — HEPARIN SOD (PORK) LOCK FLUSH 100 UNIT/ML IV SOLN
500.0000 [IU] | Freq: Once | INTRAVENOUS | Status: AC | PRN
  Administered 2016-08-09: 500 [IU]
  Filled 2016-08-09: qty 5

## 2016-08-09 NOTE — Patient Instructions (Signed)
Dehydration, Adult Dehydration is a condition in which there is not enough fluid or water in the body. This happens when you lose more fluids than you take in. Important organs, such as the kidneys, brain, and heart, cannot function without a proper amount of fluids. Any loss of fluids from the body can lead to dehydration. Dehydration can range from mild to severe. This condition should be treated right away to prevent it from becoming severe. What are the causes? This condition may be caused by:  Vomiting.  Diarrhea.  Excessive sweating, such as from heat exposure or exercise.  Not drinking enough fluid, especially:  When ill.  While doing activity that requires a lot of energy.  Excessive urination.  Fever.  Infection.  Certain medicines, such as medicines that cause the body to lose excess fluid (diuretics).  Inability to access safe drinking water.  Reduced physical ability to get adequate water and food. What increases the risk? This condition is more likely to develop in people:  Who have a poorly controlled long-term (chronic) illness, such as diabetes, heart disease, or kidney disease.  Who are age 65 or older.  Who are disabled.  Who live in a place with high altitude.  Who play endurance sports. What are the signs or symptoms? Symptoms of mild dehydration may include:   Thirst.  Dry lips.  Slightly dry mouth.  Dry, warm skin.  Dizziness. Symptoms of moderate dehydration may include:   Very dry mouth.  Muscle cramps.  Dark urine. Urine may be the color of tea.  Decreased urine production.  Decreased tear production.  Heartbeat that is irregular or faster than normal (palpitations).  Headache.  Light-headedness, especially when you stand up from a sitting position.  Fainting (syncope). Symptoms of severe dehydration may include:   Changes in skin, such as:  Cold and clammy skin.  Blotchy (mottled) or pale skin.  Skin that does  not quickly return to normal after being lightly pinched and released (poor skin turgor).  Changes in body fluids, such as:  Extreme thirst.  No tear production.  Inability to sweat when body temperature is high, such as in hot weather.  Very little urine production.  Changes in vital signs, such as:  Weak pulse.  Pulse that is more than 100 beats a minute when sitting still.  Rapid breathing.  Low blood pressure.  Other changes, such as:  Sunken eyes.  Cold hands and feet.  Confusion.  Lack of energy (lethargy).  Difficulty waking up from sleep.  Short-term weight loss.  Unconsciousness. How is this diagnosed? This condition is diagnosed based on your symptoms and a physical exam. Blood and urine tests may be done to help confirm the diagnosis. How is this treated? Treatment for this condition depends on the severity. Mild or moderate dehydration can often be treated at home. Treatment should be started right away. Do not wait until dehydration becomes severe. Severe dehydration is an emergency and it needs to be treated in a hospital. Treatment for mild dehydration may include:   Drinking more fluids.  Replacing salts and minerals in your blood (electrolytes) that you may have lost. Treatment for moderate dehydration may include:   Drinking an oral rehydration solution (ORS). This is a drink that helps you replace fluids and electrolytes (rehydrate). It can be found at pharmacies and retail stores. Treatment for severe dehydration may include:   Receiving fluids through an IV tube.  Receiving an electrolyte solution through a feeding tube that is   passed through your nose and into your stomach (nasogastric tube, or NG tube).  Correcting any abnormalities in electrolytes.  Treating the underlying cause of dehydration. Follow these instructions at home:  If directed by your health care provider, drink an ORS:  Make an ORS by following instructions on the  package.  Start by drinking small amounts, about  cup (120 mL) every 5-10 minutes.  Slowly increase how much you drink until you have taken the amount recommended by your health care provider.  Drink enough clear fluid to keep your urine clear or pale yellow. If you were told to drink an ORS, finish the ORS first, then start slowly drinking other clear fluids. Drink fluids such as:  Water. Do not drink only water. Doing that can lead to having too little salt (sodium) in the body (hyponatremia).  Ice chips.  Fruit juice that you have added water to (diluted fruit juice).  Low-calorie sports drinks.  Avoid:  Alcohol.  Drinks that contain a lot of sugar. These include high-calorie sports drinks, fruit juice that is not diluted, and soda.  Caffeine.  Foods that are greasy or contain a lot of fat or sugar.  Take over-the-counter and prescription medicines only as told by your health care provider.  Do not take sodium tablets. This can lead to having too much sodium in the body (hypernatremia).  Eat foods that contain a healthy balance of electrolytes, such as bananas, oranges, potatoes, tomatoes, and spinach.  Keep all follow-up visits as told by your health care provider. This is important. Contact a health care provider if:  You have abdominal pain that:  Gets worse.  Stays in one area (localizes).  You have a rash.  You have a stiff neck.  You are more irritable than usual.  You are sleepier or more difficult to wake up than usual.  You feel weak or dizzy.  You feel very thirsty.  You have urinated only a small amount of very dark urine over 6-8 hours. Get help right away if:  You have symptoms of severe dehydration.  You cannot drink fluids without vomiting.  Your symptoms get worse with treatment.  You have a fever.  You have a severe headache.  You have vomiting or diarrhea that:  Gets worse.  Does not go away.  You have blood or green matter  (bile) in your vomit.  You have blood in your stool. This may cause stool to look black and tarry.  You have not urinated in 6-8 hours.  You faint.  Your heart rate while sitting still is over 100 beats a minute.  You have trouble breathing. This information is not intended to replace advice given to you by your health care provider. Make sure you discuss any questions you have with your health care provider. Document Released: 04/28/2005 Document Revised: 11/23/2015 Document Reviewed: 06/22/2015 Elsevier Interactive Patient Education  2017 Elsevier Inc.  

## 2016-08-10 DIAGNOSIS — C029 Malignant neoplasm of tongue, unspecified: Secondary | ICD-10-CM | POA: Diagnosis not present

## 2016-08-12 ENCOUNTER — Ambulatory Visit: Payer: Self-pay | Admitting: Nurse Practitioner

## 2016-08-15 ENCOUNTER — Telehealth: Payer: Self-pay | Admitting: *Deleted

## 2016-08-15 NOTE — Telephone Encounter (Signed)
Oncology Nurse Navigator Documentation  Returned patient wife's call, confirmed 4/27 1310 appt with Dr. Redmond Baseman, [facility] to include laryngoscopy per Dr. Calton Dach request.  Gayleen Orem, RN, BSN, [place] at [place] [phone]

## 2016-08-16 ENCOUNTER — Ambulatory Visit: Payer: Self-pay

## 2016-08-18 MED FILL — METOCLOPRAMIDE 5 MG/5 ML SY: 5 | 6 days supply | Qty: 240 | Fill #9

## 2016-08-22 ENCOUNTER — Telehealth: Payer: Self-pay | Admitting: *Deleted

## 2016-08-22 NOTE — Telephone Encounter (Signed)
CALLED  PATIENT TO INFORM OF PET SCAN ON 09-04-16 - ARRIVAL TIME - 7:30 AM @ [facility], PT. TO BE NPO AFTER MIDNIGHT, LVM FOR A RETURN CALL

## 2016-08-23 ENCOUNTER — Ambulatory Visit: Payer: Self-pay

## 2016-08-25 ENCOUNTER — Telehealth: Payer: Self-pay | Admitting: *Deleted

## 2016-08-25 NOTE — Telephone Encounter (Signed)
Oncology Nurse Navigator Documentation  Received call from patient wife, provided her appt information for 4/26 appt/  Gayleen Orem, RN, BSN, [facility] at [place] [phone]

## 2016-08-30 ENCOUNTER — Ambulatory Visit: Payer: Self-pay

## 2016-09-01 DIAGNOSIS — C801 Malignant (primary) neoplasm, unspecified: Secondary | ICD-10-CM | POA: Diagnosis not present

## 2016-09-01 DIAGNOSIS — C099 Malignant neoplasm of tonsil, unspecified: Secondary | ICD-10-CM | POA: Diagnosis not present

## 2016-09-01 DIAGNOSIS — C090 Malignant neoplasm of tonsillar fossa: Secondary | ICD-10-CM | POA: Diagnosis not present

## 2016-09-01 DIAGNOSIS — C029 Malignant neoplasm of tongue, unspecified: Secondary | ICD-10-CM | POA: Diagnosis not present

## 2016-09-02 NOTE — Progress Notes (Signed)
  Mr. Todd [name] presents for follow up of radiation completed 05/15/16 to his Tonsillar fossa.  Pain issues, if any: he denies.  Using a feeding tube?: Yes, he is taking about 4 cans nightly via contiunous feed through a feeding pump.  Weight changes, if any?: Wt Readings from Last 3 Encounters:  09/05/16 170 lb (77.1 kg)  08/05/16 172 lb 1.6 oz (78.1 kg)  07/29/16 167 lb 9.6 oz (76 kg)   Swallowing issues, if any: He is trying to swallow some solids. He is not able to eat bread and cheese. He has tried chicken, fruits, and moistened food which seem to do well. He is drinking liquids by mouth.  Smoking or chewing tobacco?  Using fluoride trays daily?  Last ENT visit was on: He will see Dr. Redmond Baseman this afternoon for laryngoscopy per his report.  Other notable issues, if any:  He saw Glendell Docker today for evaluation.   BP 130/82   Pulse (!) 57   Temp 97.9 F (36.6 C)   Ht 5\' 10"  (1.778 m)   Wt 170 lb (77.1 kg)   SpO2 100% Comment: room air  BMI 24.39 kg/m

## 2016-09-03 NOTE — Progress Notes (Addendum)
Radiation Oncology         [phone] ________________________________  Name: Todd [name] MRN: [id number]  Date: 09/05/2016  DOB: 1960-10-03  Follow-Up Visit Note  CC: Haywood Pao, MD  Fredricka Bonine, *  Diagnosis and Prior Radiotherapy:       ICD-9-CM ICD-10-CM   1. 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  PET scan on 09/04/16 REVIEWED BY ME which showed significant response to treatment. A left sided level II node is now 7 mm with SCV of 2.6 compared to 1.3 cm with SCV of 12.1 on prior exam. There is hypermetabolic activity in the oropharynx with SUV max of 5.9 compared to previous 17.9 with resolved soft tissue fullness. There is a newly apparent 3 mm right lower lobe nodule. Some nodules from prior exam are not identified. Overall, there is no evidence to strongly suggest distant metastatic disease.  On review of systems, the patient denies pain. He is using a feeding tube and taking in approximately 4 cans of osmolite nightly via continuous feed. He reports he is trying to swallow some solid foods. He is not able to eat bread or cheese; he has also tried chicken, fruits, and moistened foods which seem to do well. He is drinking liquids orally.  The patient reports he has an appointment this afternoon with Dr. Redmond Baseman for laryngoscopy. The patient saw Glendell Docker in swallowing therapy today for evaluation.    ALLERGIES:  is allergic to phenergan [promethazine hcl].  Meds: Current Outpatient Prescriptions  Medication Sig Dispense Refill  . fenofibrate (TRICOR) 145 MG tablet Take 145 mg by mouth daily.     [name] levothyroxine (SYNTHROID, LEVOTHROID) 50 MCG tablet Take 50 mcg  by mouth daily before breakfast.     . Nutritional Supplements (FEEDING SUPPLEMENT, OSMOLITE 1.5 CAL,) LIQD Run Osmolite 1.5 at 100 ML/hr for 12 hours via PEG. Give one can BID daily with 120 cc free water before and after bolus feedings and continuous feeding. 1659 mL 0  . sodium fluoride (FLUORISHIELD) 1.1 % GEL dental gel Instill one drop of gel per tooth space of fluoride tray. Place over teeth for 5 minutes. Remove. Spit out excess. Repeat nightly. 120 mL prn  . lactulose (CHRONULAC) 10 GM/15ML solution Place 15 mLs (10 g total) into feeding tube 3 (three) times daily. (Patient not taking: Reported on 09/05/2016) 240 mL 0  . lidocaine-prilocaine (EMLA) cream Apply to affected area once (Patient not taking: Reported on 08/05/2016) 30 g 3  . metoCLOPramide (REGLAN) 5 MG/5ML solution   11   No current facility-administered medications for this encounter.    Facility-Administered Medications Ordered in Other Encounters  Medication Dose Route Frequency Provider Last Rate Last Dose  . fludeoxyglucose F - 18 (FDG) injection 8.5 millicurie  8.5 millicurie Intravenous Once PRN Evangeline Dakin, MD        Physical Findings: The patient is in no acute distress. Patient is alert and oriented. Wt Readings from Last 3 Encounters:  09/05/16 170 lb (77.1 kg)  08/05/16 172 lb 1.6 oz (78.1 kg)  07/29/16 167 lb 9.6 oz (76 kg)    height is 5\' 10"  (1.778 m) and weight is 170 lb (77.1 kg).  His temperature is 97.9 F (36.6 C). His blood pressure is 130/82 and his pulse is 57 (abnormal). His oxygen saturation is 100%.  General: Alert and oriented, in no acute distress. HEENT: Mucous membranes are slightly dry. In left tonsillar region there is a 5-61mm patch of whitish tissue, which appears to be a residual ulcer from treatment. No masses. Neck: Neck is without palpable masses in the cervical or supraclavicular regions. Chest: Heart regular in rate and rhythm. Lungs: Clear to ausculation  bilaterally. Lymphatics: see Neck Exam  Laryngoscopy deferred today in light of upcoming exam with Dr. Redmond Baseman.   Lab Findings: Lab Results  Component Value Date   WBC 3.3 (L) 08/05/2016   HGB 10.9 (L) 08/05/2016   HCT 31.6 (L) 08/05/2016   MCV 88.4 08/05/2016   PLT 303 08/05/2016    Lab Results  Component Value Date   TSH 1.590 07/22/2016    Radiographic Findings: Nm Pet Image Restag (ps) Skull Base To Thigh  Result Date: 09/04/2016 CLINICAL DATA:  Subsequent treatment strategy for restaging of oropharyngeal cancer. EXAM: NUCLEAR MEDICINE PET SKULL BASE TO THIGH TECHNIQUE: 8.5 mCi F-18 FDG was injected intravenously. Full-ring PET imaging was performed from the skull base to thigh after the radiotracer. CT data was obtained and used for attenuation correction and anatomic localization. FASTING BLOOD GLUCOSE:  Value: 118 mg/dl COMPARISON:  02/26/2016 PET.  abdominopelvic CT of 05/11/2016. FINDINGS: NECK Significant improvement in hypermetabolism centered about the left oropharynx and palatine tonsil. This measures a S.U.V. max of 5.9 including on image 32/series 4. Compare a S.U.V. max of 17.9 on the prior exam. Soft tissue fullness in this area has resolved. Improvement to resolution of hypermetabolic cervical lymph nodes. Resolution of cervical adenopathy. Left-sided level 2 nodes measure maximally 7 mm and a S.U.V. max of 2.6 on image 36/series 4. Compare 1.3 cm and a S.U.V. max of 12.1 on the prior exam (when remeasured). Fluid in the right maxillary sinus is new. Mucosal thickening of the left maxillary sinus is worsened. CHEST No hypermetabolic nodal or pulmonary parenchymal hypermetabolism identified. A right-sided Port-A-Cath which terminates at the low SVC. Mild cardiomegaly. Lad coronary artery atherosclerosis. Right lower lobe 3 mm nodule on image 57/series 8 is not readily apparent on the prior. A 3 mm left lower lobe nodule on image 38/ series 8 is unchanged. Other nodules  described on the prior exam are not identified. ABDOMEN/PELVIS No areas of abnormal hypermetabolism. Normal adrenal glands. Gastrostomy tube. Moderate prostatomegaly. SKELETON No abnormal marrow activity. No focal osseous lesion. IMPRESSION: 1. Significant response to therapy of left oropharyngeal/palatine tonsil primary. Low-level residual hypermetabolism could be treatment related or represent residual disease. 2. Resolution of cervical adenopathy. Low-level hypermetabolism within only 1 level 2 node remains. 3. No evidence of hypermetabolic extra cervical disease. 4. Age advanced coronary artery atherosclerosis. Recommend assessment of coronary risk factors and consideration of medical therapy. 5. Progressive sinus disease. 6. Bilateral pulmonary nodules. A right lower lobe nodule may be new. Below PET resolution. Recommend attention on follow-up. Electronically Signed   By: Abigail Miyamoto M.D.   On: 09/04/2016 10:34    Impression/Plan:    1) Head and Neck Cancer Status: NED - I favor PET to be without disease. I think CT neck/chest is prudent in 3-6 mo at med/onc f/u.  Discuss at tumor board.  2) Nutritional Status: good; 3 lb weight gain PEG tube: Yes - still uses  3) Risk Factors: The patient has been educated about risk factors including  tobacco abuse; they understand that avoidance of tobacco is important to prevent recurrences as well as other cancers.   4) Swallowing: Mild difficulty at this time.  Follow up with swallowing therapy in May.  5) Dental: Encouraged to continue regular followup with dentistry, and dental hygiene including fluoride rinses.  Encouraged to use fluoride trays daily.  6) Thyroid function: TSH normal in march; Patient will continue to have TSH followed by PCP as hypothyroidism preceded cancer diagnosis. Currently on Levothyroxine 50 mg. Lab Results  Component Value Date   TSH 1.590 07/22/2016   7) The patient may be a candidate to have PEG tube and PAC removed in  the future pending his recovery.  8) He will alternate follow ups with Dr. Alvy Bimler and Dr. Redmond Baseman and I will see him back as needed. His next appointment with Dr. Alvy Bimler is in July; he will likely  have another CT scan before this time pending tumor board discussion.. I will have him be presented at tumor board.  Dr Redmond Baseman will examine him today. I explained to the patient that our protocol for cancer center patients with head and neck histories is as follows: Patients treated with chemo-RT follow alternating w/ med onc and otolaryngology. Patients treated definitively with RT follow alternating w/ rad onc and otolaryngology.  Therefore, I'll see him back PRN.  He is pleased with this plan. _____________________________________   Eppie Gibson, MD  This document serves as a record of services personally performed by Eppie Gibson, MD. It was created on her behalf by Maryla Morrow, a trained medical scribe. The creation of this record is based on the scribe's personal observations and the provider's statements to them. This document has been checked and approved by the attending provider.

## 2016-09-04 ENCOUNTER — Ambulatory Visit (HOSPITAL_COMMUNITY)
Admission: RE | Admit: 2016-09-04 | Discharge: 2016-09-04 | Disposition: A | Discharge status: Home / Self Care | Payer: BLUE CROSS/BLUE SHIELD | Source: Ambulatory Visit | Attending: Radiation Oncology | Admitting: Radiation Oncology

## 2016-09-04 DIAGNOSIS — C090 Malignant neoplasm of tonsillar fossa: Secondary | ICD-10-CM

## 2016-09-04 DIAGNOSIS — I2510 Atherosclerotic heart disease of native coronary artery without angina pectoris: Secondary | ICD-10-CM | POA: Diagnosis not present

## 2016-09-04 DIAGNOSIS — R918 Other nonspecific abnormal finding of lung field: Secondary | ICD-10-CM | POA: Insufficient documentation

## 2016-09-04 DIAGNOSIS — C109 Malignant neoplasm of oropharynx, unspecified: Secondary | ICD-10-CM | POA: Diagnosis not present

## 2016-09-04 LAB — GLUCOSE, CAPILLARY: GLUCOSE-CAPILLARY: 118 mg/dL — AB (ref 65–99)

## 2016-09-04 MED ORDER — FLUDEOXYGLUCOSE F - 18 (FDG) INJECTION: 8.5000 | Freq: Once | INTRAVENOUS | Status: DC | PRN

## 2016-09-05 ENCOUNTER — Ambulatory Visit
Admission: RE | Admit: 2016-09-05 | Discharge: 2016-09-05 | Disposition: A | Discharge status: Home / Self Care | Payer: BLUE CROSS/BLUE SHIELD | Source: Ambulatory Visit | Attending: Radiation Oncology | Admitting: Radiation Oncology

## 2016-09-05 ENCOUNTER — Ambulatory Visit: Payer: BLUE CROSS/BLUE SHIELD | Attending: Hematology and Oncology

## 2016-09-05 ENCOUNTER — Encounter: Payer: Self-pay | Admitting: Radiation Oncology

## 2016-09-05 DIAGNOSIS — R1313 Dysphagia, pharyngeal phase: Secondary | ICD-10-CM | POA: Diagnosis not present

## 2016-09-05 DIAGNOSIS — Z85810 Personal history of malignant neoplasm of tongue: Secondary | ICD-10-CM | POA: Diagnosis not present

## 2016-09-05 DIAGNOSIS — Z8529 Personal history of malignant neoplasm of other respiratory and intrathoracic organs: Secondary | ICD-10-CM | POA: Diagnosis not present

## 2016-09-05 DIAGNOSIS — C090 Malignant neoplasm of tonsillar fossa: Secondary | ICD-10-CM

## 2016-09-05 DIAGNOSIS — R918 Other nonspecific abnormal finding of lung field: Secondary | ICD-10-CM | POA: Insufficient documentation

## 2016-09-05 DIAGNOSIS — N400 Enlarged prostate without lower urinary tract symptoms: Secondary | ICD-10-CM | POA: Diagnosis not present

## 2016-09-05 DIAGNOSIS — R1310 Dysphagia, unspecified: Secondary | ICD-10-CM | POA: Insufficient documentation

## 2016-09-05 DIAGNOSIS — J019 Acute sinusitis, unspecified: Secondary | ICD-10-CM | POA: Diagnosis not present

## 2016-09-05 DIAGNOSIS — I517 Cardiomegaly: Secondary | ICD-10-CM | POA: Diagnosis not present

## 2016-09-05 DIAGNOSIS — C109 Malignant neoplasm of oropharynx, unspecified: Secondary | ICD-10-CM | POA: Diagnosis not present

## 2016-09-05 DIAGNOSIS — E039 Hypothyroidism, unspecified: Secondary | ICD-10-CM | POA: Diagnosis not present

## 2016-09-05 DIAGNOSIS — Z08 Encounter for follow-up examination after completed treatment for malignant neoplasm: Secondary | ICD-10-CM | POA: Diagnosis not present

## 2016-09-05 DIAGNOSIS — J343 Hypertrophy of nasal turbinates: Secondary | ICD-10-CM | POA: Diagnosis not present

## 2016-09-05 DIAGNOSIS — Z923 Personal history of irradiation: Secondary | ICD-10-CM | POA: Insufficient documentation

## 2016-09-05 DIAGNOSIS — Z931 Gastrostomy status: Secondary | ICD-10-CM | POA: Diagnosis not present

## 2016-09-05 MED FILL — METOCLOPRAMIDE 5 MG/5 ML SY: 5 | 6 days supply | Qty: 240 | Fill #10

## 2016-09-05 NOTE — Patient Instructions (Signed)
Do the exercises twice each day. If your frequency of cough has not subsided next month I will recommend a modified barium swallow exam which examines how food and liquid move through your throat.

## 2016-09-05 NOTE — Therapy (Signed)
[facility] [address] [place], [place], [address] Phone: [phone]   Fax:  [phone]  Speech Language Pathology Treatment  Patient Details  Name: Todd [name] MRN: [id number] Date of Birth: Jan 08, 1961 Referring Provider: Eppie Gibson MD  Encounter Date: 09/05/2016      End of Session - 09/05/16 1402    Visit Number 5   Number of Visits 6   Date for SLP Re-Evaluation 10/08/16   SLP Start Time 0850   SLP Stop Time  0930   SLP Time Calculation (min) 40 min      Past Medical History:  Diagnosis Date  . Arthritis   . Cancer ([place])    Tonsil Cancer  . History of radiation therapy 03/24/2016- 05/15/2016   Left Tonsil and Bilateral Neck  . Hyperlipidemia   . Hypertension   . Hypothyroidism     Past Surgical History:  Procedure Laterality Date  . IR GENERIC HISTORICAL  03/20/2016   IR US GUIDE VASC ACCESS RIGHT 03/20/2016 Sandi Mariscal, MD [facility]  . IR GENERIC HISTORICAL  03/20/2016   IR FLUORO GUIDE PORT INSERTION RIGHT 03/20/2016 Sandi Mariscal, MD [facility]  . IR GENERIC HISTORICAL  03/20/2016   IR GASTROSTOMY TUBE MOD SED 03/20/2016 Sandi Mariscal, MD [facility]    There were no vitals filed for this visit.             ADULT SLP TREATMENT - 09/05/16 0852      General Information   Behavior/Cognition Alert;Cooperative;Pleasant mood     Treatment Provided   Treatment provided Dysphagia     Dysphagia Treatment   Temperature Spikes Noted No   Respiratory Status Room air   Oral Cavity - Dentition Adequate natural dentition   Treatment Methods Skilled observation;Upgraded PO texture trial;Therapeutic exercise;Patient/caregiver education   Patient observed directly with PO's Yes   Type of PO's observed Dysphagia 3 (soft);Thin liquids   Liquids provided via Cup   Oral Phase Signs & Symptoms --  none noted   Pharyngeal Phase Signs & Symptoms Immediate throat clear  with solids, but not with  thin liquids   Type of cueing Verbal;Visual   Other treatment/comments Weekends pt tries soft solids - 3 boosts per day, 4 osmolites/night and yogurt sometimes during the day. Pt tried cheese/meat rollup which went "alright". Complains of xerostomia - uses water or other liquid to compensate for lack of saliva. Doing HEP once a day and SLP congratulated pt on more frequent completion from last session but strongly reiterated to pt to complete BID, as palpation of pt's submandibular region revealed very possible initiation of muscle fibrosis. With PO water, pt with minimal/WNL frequency throat clearing but with solids, consistent throat clearing, SLP told pt to complete HEP BID and at next visit if frequency of clearing is not decreased/gone then a modified barium swallow exam (MBSS) may be warranted to ID difficulty with pharyngeal stage of swallow. Pt agreed to this plan. Pt req'd SBA with HEP.     Assessment / Recommendations / Plan   Plan Continue with current plan of care     Dysphagia Recommendations   Diet recommendations Dysphagia 3 (mechanical soft);Regular;Thin liquid  as tolerated   Liquids provided via Cup   Medication Administration Whole meds with liquid  one at a time   Compensations Small sips/bites;Effortful swallow;Follow solids with liquid     Progression Toward Goals   Progression toward goals Progressing toward goals  SLP Education - 09/05/16 1402    Education provided Yes   Education Details BID HEP, late effects head/neck rad, modified bariums wallow exam after next month visit is possible   Person(s) Educated Patient   Methods Explanation   Comprehension Verbalized understanding          SLP Short Term Goals - 08/07/16 1045      SLP SHORT TERM GOAL #1   Title pt will complete HEP with rare min A   Status Not Met     SLP SHORT TERM GOAL #2   Title pt will demo knowledge of 3 overt s/s aspiration PNA with modified indpendnece   Status Achieved      SLP SHORT TERM GOAL #3   Title pt will tell SLP how a food journal can assist in return to more normal PO diet   Status Achieved          SLP Long Term Goals - 09/05/16 1406      [place] #1   Title pt will complete HEP with modified indpendence over 2 therapy visits   Baseline 09/05/16   Time 1   Period --  visits   Status On-going     SLP LONG TERM GOAL #2   Title pt will tell SLP when to switch frequency of HEP from 2-3/day to x2/week   Time 1   Period --  visits   Status On-going          Plan - 09/05/16 1403    Clinical Impression Statement SLP suspects cont mild fibrotic tissue in bil submandibular region due to noncompliance with HEP. Pt has incr'd HEP to once/day, and SLP strongly told pt BID needed. See "other treatment/comments" and "education" above for more details. Pt with consistent throat clearing with solids, minimal signs difficulty with liquids. He would cont to benefit from skilled ST approx once every four weeks, to maintain safe swallow function and mitigate danger of aspiration/aspiration PNA.   Speech Therapy Frequency --  once approx every 4 weeks   Duration --  4 visits   Treatment/Interventions Aspiration precaution training;Pharyngeal strengthening exercises;Compensatory techniques;SLP instruction and feedback;Patient/family education;Trials of upgraded texture/liquids  any and/or all may be used in ST sessions   Potential to Achieve Goals Good      Patient will benefit from skilled therapeutic intervention in order to improve the following deficits and impairments:   Dysphagia, unspecified type    Problem List Patient Active Problem List   Diagnosis Date Noted  . Dysphagia 06/13/2016  . Pancytopenia, acquired ([place]) 05/26/2016  . Protein-calorie malnutrition, moderate ([place]) 05/26/2016  . Deficiency anemia 05/26/2016  . Postprandial abdominal bloating 05/16/2016  . Redness of both eyes 05/16/2016  . Splenic lesion 05/16/2016   . Frequent epistaxis 04/28/2016  . Cough 04/21/2016  . Other constipation 04/21/2016  . Drug-induced neutropenia ([place]) 04/14/2016  . Anemia due to antineoplastic chemotherapy 04/14/2016  . Jaw pain 04/08/2016  . Chemotherapy-induced nausea 03/31/2016  . Bilateral tinnitus 03/31/2016  . Essential hypertension 03/18/2016  . Hemoptysis 02/26/2016  . Weight loss 02/26/2016  . Carcinoma of tonsillar fossa ([place]) 02/26/2016  . Tonsil cancer ([place]) 02/21/2016    [facility] ,[place], [place]  09/05/2016, 2:06 PM  [facility] [address] [place], [place], [address] Phone: [phone]   Fax:  [phone]   Name: Todd [name] MRN: [id number] Date of Birth: 02-06-1961

## 2016-09-06 ENCOUNTER — Ambulatory Visit: Payer: Self-pay

## 2016-09-07 ENCOUNTER — Encounter: Payer: Self-pay | Admitting: Radiation Oncology

## 2016-09-08 ENCOUNTER — Other Ambulatory Visit: Payer: Self-pay | Admitting: Hematology and Oncology

## 2016-09-08 DIAGNOSIS — E038 Other specified hypothyroidism: Secondary | ICD-10-CM | POA: Diagnosis not present

## 2016-09-08 DIAGNOSIS — R918 Other nonspecific abnormal finding of lung field: Secondary | ICD-10-CM | POA: Insufficient documentation

## 2016-09-08 DIAGNOSIS — Z125 Encounter for screening for malignant neoplasm of prostate: Secondary | ICD-10-CM | POA: Diagnosis not present

## 2016-09-08 DIAGNOSIS — R7309 Other abnormal glucose: Secondary | ICD-10-CM | POA: Diagnosis not present

## 2016-09-08 DIAGNOSIS — I10 Essential (primary) hypertension: Secondary | ICD-10-CM | POA: Diagnosis not present

## 2016-09-08 DIAGNOSIS — C090 Malignant neoplasm of tonsillar fossa: Secondary | ICD-10-CM

## 2016-09-09 ENCOUNTER — Telehealth: Payer: Self-pay | Admitting: *Deleted

## 2016-09-09 DIAGNOSIS — C029 Malignant neoplasm of tongue, unspecified: Secondary | ICD-10-CM | POA: Diagnosis not present

## 2016-09-09 NOTE — Telephone Encounter (Signed)
LM with message below. Requested call back to discuss

## 2016-09-09 NOTE — Telephone Encounter (Signed)
-----   Message from Heath Lark, MD sent at 09/08/2016  6:58 AM EDT ----- Regarding: port Hi Todd [name],  Can you call him? PET looks good. He has appt to see me in July. I will order repeat CT. Can you call him and ask if he is ready for port and PEG to come out? Let me know  Thanks

## 2016-09-12 ENCOUNTER — Telehealth: Payer: Self-pay | Admitting: Hematology and Oncology

## 2016-09-12 NOTE — Telephone Encounter (Signed)
FAXED LAST OFFICE NOTE TO [facility]

## 2016-09-15 DIAGNOSIS — Z0000 Encounter for general adult medical examination without abnormal findings: Secondary | ICD-10-CM | POA: Diagnosis not present

## 2016-09-15 DIAGNOSIS — C099 Malignant neoplasm of tonsil, unspecified: Secondary | ICD-10-CM | POA: Diagnosis not present

## 2016-09-15 DIAGNOSIS — E46 Unspecified protein-calorie malnutrition: Secondary | ICD-10-CM | POA: Diagnosis not present

## 2016-09-15 DIAGNOSIS — R7301 Impaired fasting glucose: Secondary | ICD-10-CM | POA: Diagnosis not present

## 2016-09-15 DIAGNOSIS — E7800 Pure hypercholesterolemia, unspecified: Secondary | ICD-10-CM | POA: Diagnosis not present

## 2016-09-18 DIAGNOSIS — C090 Malignant neoplasm of tonsillar fossa: Secondary | ICD-10-CM | POA: Diagnosis not present

## 2016-09-18 DIAGNOSIS — C801 Malignant (primary) neoplasm, unspecified: Secondary | ICD-10-CM | POA: Diagnosis not present

## 2016-09-18 DIAGNOSIS — C099 Malignant neoplasm of tonsil, unspecified: Secondary | ICD-10-CM | POA: Diagnosis not present

## 2016-09-18 DIAGNOSIS — C029 Malignant neoplasm of tongue, unspecified: Secondary | ICD-10-CM | POA: Diagnosis not present

## 2016-10-03 ENCOUNTER — Ambulatory Visit: Payer: BLUE CROSS/BLUE SHIELD | Attending: Hematology and Oncology

## 2016-10-03 DIAGNOSIS — R1310 Dysphagia, unspecified: Secondary | ICD-10-CM | POA: Diagnosis not present

## 2016-10-03 NOTE — Therapy (Signed)
[facility] [address] [place], [place], [address] Phone: [phone]   Fax:  [phone]  Speech Language Pathology Treatment  Patient Details  Name: Todd [name] MRN: [id number] Date of Birth: Feb 09, 1961 Referring Provider: Eppie Gibson MD  Encounter Date: 10/03/2016      End of Session - 10/03/16 1520    Visit Number 6   Number of Visits 9   Date for SLP Re-Evaluation 01/09/17   SLP Start Time 0850   SLP Stop Time  0930   SLP Time Calculation (min) 40 min   Activity Tolerance Patient tolerated treatment well      Past Medical History:  Diagnosis Date  . Arthritis   . Cancer ([place])    Tonsil Cancer  . History of radiation therapy 03/24/2016- 05/15/2016   Left Tonsil and Bilateral Neck  . Hyperlipidemia   . Hypertension   . Hypothyroidism     Past Surgical History:  Procedure Laterality Date  . IR GENERIC HISTORICAL  03/20/2016   IR US GUIDE VASC ACCESS RIGHT 03/20/2016 Sandi Mariscal, MD [facility]  . IR GENERIC HISTORICAL  03/20/2016   IR FLUORO GUIDE PORT INSERTION RIGHT 03/20/2016 Sandi Mariscal, MD [facility]  . IR GENERIC HISTORICAL  03/20/2016   IR GASTROSTOMY TUBE MOD SED 03/20/2016 Sandi Mariscal, MD [facility]    There were no vitals filed for this visit.      Subjective Assessment - 10/03/16 0859    Subjective Pt with Dr. Redmond Baseman 09-05-16. Upon inspection of larynx, diffuse laryngeal edema noted with pooling of secretions. "It takes me twice as long to eat."   Currently in Pain? No/denies               ADULT SLP TREATMENT - 10/03/16 0900      General Information   Behavior/Cognition Alert;Cooperative;Pleasant mood     Dysphagia Treatment   Temperature Spikes Noted No   Respiratory Status Room air   Oral Cavity - Dentition Adequate natural dentition   Treatment Methods Skilled observation;Therapeutic exercise;Patient/caregiver education   Patient observed directly with PO's Yes   Type of PO's observed Dysphagia 3 (soft);Thin liquids;Regular   Liquids provided via Cup   Oral Phase Signs & Symptoms --  none    Pharyngeal Phase Signs & Symptoms Immediate throat clear;Complaints of residue  more residue reported with cereal bar   Other treatment/comments Pt not completing HEP twice each day, as recommended, but once a day and then some of the exercises twice, on some days. 3-4 bottles of boost each day, 4 cans tube feeding at night. With cereal bar and water, pt with immediate and persistent throat clearing with the same frequency as last session. Same results with peanut butter cracker and water. Pt may think it is more efficient to use tube feeding rather than eat PO, given pt's workday/schedule (?). He stated it was easier to do tube feeds at night rather than try to eat something, when he arrives home from work. SLP strongly encouraged pt to find microwavable meals (pt stated he enjoys microwave meals) that contain softer and more "mushy" foods such as mac and cheese, stir fry, etc. SLP recommends modified (MBSS) to rule out oropharyngeal dysphagia with POs.      Assessment / Recommendations / Plan   Plan Continue with current plan of care;New goals to be determined pending instrumental study  Modified barium swallow exam (MBSS)     Dysphagia Recommendations   Diet recommendations Regular;Dysphagia 3 (mechanical  soft);Thin liquid   Liquids provided via Cup   Medication Administration Whole meds with liquid   Compensations Small sips/bites;Effortful swallow;Follow solids with liquid  use gravies and sauces to assist food in clearing pharynx     Progression Toward Goals   Progression toward goals Progressing toward goals          SLP Education - 10/03/16 1519    Education provided Yes   Education Details BID HEP, need to eat PO, components of a modified barium swallow exam   Person(s) Educated Patient   Methods Explanation;Other (comment)  model of tongue/pharynx    Comprehension Verbalized understanding          SLP Short Term Goals - 08/07/16 1045      SLP SHORT TERM GOAL #1   Title pt will complete HEP with rare min A   Status Not Met     SLP SHORT TERM GOAL #2   Title pt will demo knowledge of 3 overt s/s aspiration PNA with modified indpendnece   Status Achieved     SLP SHORT TERM GOAL #3   Title pt will tell SLP how a food journal can assist in return to more normal PO diet   Status Achieved          SLP Long Term Goals - 10/03/16 [place] #1   Title pt will complete HEP with modified indpendence over 2 therapy visits   Baseline 09/05/16   Time 3   Period --  visits   Status On-going     SLP LONG TERM GOAL #2   Title pt will tell SLP when to switch frequency of HEP from 2-3/day to x2/week   Time 3   Period --  visits   Status On-going     SLP LONG TERM GOAL #3   Title pt will follow any precautions from recommended modified barium swallow exam wiht modified independence   Time 3   Period --  visits   Status New          Plan - 10/03/16 1521    Clinical Impression Statement SLP suspects cont mild fibrotic tissue in bil submandibular region due to noncompliance with HEP vs. lymphedema. Pt has cont'd HEP to once/day and is doing some of the HEP twice on some days (cont noncompliant), SLP again strongly told pt BID was needed. See "other treatment/comments" and "education" above for more details. Pt with cont'd consistent throat clearing with solids and liquids. A modified barium swallow is recommended to r/o oropharyngeal dysphagia caused by uscle weakness/reduced ROM. He would cont to benefit from skilled ST approx once every four weeks, to maintain safe swallow function and mitigate danger of aspiration/aspiration PNA. See goal update for current progress on goals. All goals will be renewed.   Speech Therapy Frequency --  once approx every 4 weeks   Duration --  3 visits    Treatment/Interventions Aspiration precaution training;Pharyngeal strengthening exercises;Compensatory techniques;SLP instruction and feedback;Patient/family education;Trials of upgraded texture/liquids  any and/or all may be used in ST sessions   Potential to Achieve Goals Good   Potential Considerations Cooperation/participation level   Consulted and Agree with Plan of Care Patient      Patient will benefit from skilled therapeutic intervention in order to improve the following deficits and impairments:   Dysphagia, unspecified type    Problem List Patient Active Problem List   Diagnosis Date Noted  . Multiple lung nodules on CT  09/08/2016  . Dysphagia 06/13/2016  . Pancytopenia, acquired ([place]) 05/26/2016  . Protein-calorie malnutrition, moderate ([place]) 05/26/2016  . Deficiency anemia 05/26/2016  . Postprandial abdominal bloating 05/16/2016  . Redness of both eyes 05/16/2016  . Splenic lesion 05/16/2016  . Frequent epistaxis 04/28/2016  . Cough 04/21/2016  . Other constipation 04/21/2016  . Drug-induced neutropenia ([place]) 04/14/2016  . Anemia due to antineoplastic chemotherapy 04/14/2016  . Jaw pain 04/08/2016  . Chemotherapy-induced nausea 03/31/2016  . Bilateral tinnitus 03/31/2016  . Essential hypertension 03/18/2016  . Hemoptysis 02/26/2016  . Weight loss 02/26/2016  . Carcinoma of tonsillar fossa ([place]) 02/26/2016  . Tonsil cancer ([place]) 02/21/2016    [facility] ,[place], [place]  10/03/2016, 3:46 PM  [facility] [address] [place], [place], [address] Phone: [phone]   Fax:  [phone]   Name: Parmvir Boomer MRN: [id number] Date of Birth: 1961-01-06

## 2016-10-03 NOTE — Patient Instructions (Addendum)
Find something soft to eat (stir fry, mac and cheese) that's quick, as you enjoy those meals, when you get home from work.  DO EXERCISES TWICE EACH DAY. I am recommending a MODIFIED BARIUM SWALLOW exam for you because you continue to clear your throat with food by mouth.

## 2016-10-07 ENCOUNTER — Other Ambulatory Visit: Payer: Self-pay | Admitting: Hematology and Oncology

## 2016-10-07 DIAGNOSIS — C090 Malignant neoplasm of tonsillar fossa: Secondary | ICD-10-CM

## 2016-10-10 DIAGNOSIS — C029 Malignant neoplasm of tongue, unspecified: Secondary | ICD-10-CM | POA: Diagnosis not present

## 2016-10-16 DIAGNOSIS — C109 Malignant neoplasm of oropharynx, unspecified: Secondary | ICD-10-CM | POA: Diagnosis not present

## 2016-10-16 DIAGNOSIS — R1313 Dysphagia, pharyngeal phase: Secondary | ICD-10-CM | POA: Diagnosis not present

## 2016-10-17 ENCOUNTER — Other Ambulatory Visit (HOSPITAL_COMMUNITY): Payer: Self-pay | Admitting: Hematology and Oncology

## 2016-10-17 DIAGNOSIS — R1310 Dysphagia, unspecified: Secondary | ICD-10-CM

## 2016-10-18 DIAGNOSIS — C090 Malignant neoplasm of tonsillar fossa: Secondary | ICD-10-CM | POA: Diagnosis not present

## 2016-10-18 DIAGNOSIS — C029 Malignant neoplasm of tongue, unspecified: Secondary | ICD-10-CM | POA: Diagnosis not present

## 2016-10-18 DIAGNOSIS — C099 Malignant neoplasm of tonsil, unspecified: Secondary | ICD-10-CM | POA: Diagnosis not present

## 2016-10-18 DIAGNOSIS — C801 Malignant (primary) neoplasm, unspecified: Secondary | ICD-10-CM | POA: Diagnosis not present

## 2016-10-27 ENCOUNTER — Ambulatory Visit (HOSPITAL_COMMUNITY)
Admission: RE | Admit: 2016-10-27 | Discharge: 2016-10-27 | Disposition: A | Discharge status: Home / Self Care | Payer: BLUE CROSS/BLUE SHIELD | Source: Ambulatory Visit | Attending: Hematology and Oncology | Admitting: Hematology and Oncology

## 2016-10-27 DIAGNOSIS — R1310 Dysphagia, unspecified: Secondary | ICD-10-CM

## 2016-10-27 DIAGNOSIS — C090 Malignant neoplasm of tonsillar fossa: Secondary | ICD-10-CM | POA: Insufficient documentation

## 2016-10-31 ENCOUNTER — Telehealth: Payer: Self-pay | Admitting: *Deleted

## 2016-11-03 NOTE — Telephone Encounter (Signed)
A user error has taken place: encounter opened in error, closed for administrative reasons.

## 2016-11-09 DIAGNOSIS — C029 Malignant neoplasm of tongue, unspecified: Secondary | ICD-10-CM | POA: Diagnosis not present

## 2016-11-11 ENCOUNTER — Telehealth: Payer: Self-pay | Admitting: *Deleted

## 2016-11-11 NOTE — Telephone Encounter (Signed)
Oncology Nurse Navigator Documentation  Received call from patient's wife indicating they have not received call re scheduling of CT scans needed prior to 7/30 follow-up appt with Dr. Alvy Bimler.  I called Radiology Scheduling, informed pre-authorization not yet received.  I called Loletta Parish, [place] Team, [facility] re status of pre-authorization, asked for return call.  Gayleen Orem, RN, BSN, [place] [facility] Oncology Nurse [place] [facility] [place] [phone]

## 2016-11-17 DIAGNOSIS — C029 Malignant neoplasm of tongue, unspecified: Secondary | ICD-10-CM | POA: Diagnosis not present

## 2016-11-17 DIAGNOSIS — C099 Malignant neoplasm of tonsil, unspecified: Secondary | ICD-10-CM | POA: Diagnosis not present

## 2016-11-17 DIAGNOSIS — C090 Malignant neoplasm of tonsillar fossa: Secondary | ICD-10-CM | POA: Diagnosis not present

## 2016-11-17 DIAGNOSIS — C801 Malignant (primary) neoplasm, unspecified: Secondary | ICD-10-CM | POA: Diagnosis not present

## 2016-11-20 ENCOUNTER — Telehealth: Payer: Self-pay | Admitting: Hematology and Oncology

## 2016-11-20 NOTE — Telephone Encounter (Signed)
Left message for patient to confirm appt date and time.

## 2016-11-24 ENCOUNTER — Encounter (HOSPITAL_COMMUNITY): Payer: Self-pay

## 2016-11-24 ENCOUNTER — Ambulatory Visit (HOSPITAL_COMMUNITY)
Admission: RE | Admit: 2016-11-24 | Discharge: 2016-11-24 | Disposition: A | Discharge status: Home / Self Care | Payer: BLUE CROSS/BLUE SHIELD | Source: Ambulatory Visit | Attending: Hematology and Oncology | Admitting: Hematology and Oncology

## 2016-11-24 ENCOUNTER — Ambulatory Visit (HOSPITAL_COMMUNITY): Payer: BLUE CROSS/BLUE SHIELD

## 2016-11-24 ENCOUNTER — Telehealth: Payer: Self-pay | Admitting: Hematology and Oncology

## 2016-11-24 ENCOUNTER — Ambulatory Visit (HOSPITAL_BASED_OUTPATIENT_CLINIC_OR_DEPARTMENT_OTHER): Payer: BLUE CROSS/BLUE SHIELD | Admitting: Hematology and Oncology

## 2016-11-24 ENCOUNTER — Other Ambulatory Visit (HOSPITAL_BASED_OUTPATIENT_CLINIC_OR_DEPARTMENT_OTHER): Payer: BLUE CROSS/BLUE SHIELD

## 2016-11-24 DIAGNOSIS — R1310 Dysphagia, unspecified: Secondary | ICD-10-CM | POA: Diagnosis not present

## 2016-11-24 DIAGNOSIS — C099 Malignant neoplasm of tonsil, unspecified: Secondary | ICD-10-CM

## 2016-11-24 DIAGNOSIS — Z931 Gastrostomy status: Secondary | ICD-10-CM | POA: Insufficient documentation

## 2016-11-24 DIAGNOSIS — Z923 Personal history of irradiation: Secondary | ICD-10-CM | POA: Diagnosis not present

## 2016-11-24 DIAGNOSIS — R918 Other nonspecific abnormal finding of lung field: Secondary | ICD-10-CM | POA: Insufficient documentation

## 2016-11-24 DIAGNOSIS — I890 Lymphedema, not elsewhere classified: Secondary | ICD-10-CM | POA: Diagnosis not present

## 2016-11-24 DIAGNOSIS — C090 Malignant neoplasm of tonsillar fossa: Secondary | ICD-10-CM | POA: Insufficient documentation

## 2016-11-24 DIAGNOSIS — C091 Malignant neoplasm of tonsillar pillar (anterior) (posterior): Secondary | ICD-10-CM | POA: Diagnosis not present

## 2016-11-24 DIAGNOSIS — C770 Secondary and unspecified malignant neoplasm of lymph nodes of head, face and neck: Secondary | ICD-10-CM | POA: Insufficient documentation

## 2016-11-24 LAB — CBC WITH DIFFERENTIAL/PLATELET
BASO%: 0.7 % (ref 0.0–2.0)
Basophils Absolute: 0 10*3/uL (ref 0.0–0.1)
EOS%: 0.9 % (ref 0.0–7.0)
Eosinophils Absolute: 0 10*3/uL (ref 0.0–0.5)
HEMATOCRIT: 34.1 % — AB (ref 38.4–49.9)
HGB: 11.5 g/dL — ABNORMAL LOW (ref 13.0–17.1)
LYMPH#: 0.5 10*3/uL — AB (ref 0.9–3.3)
LYMPH%: 12.3 % — ABNORMAL LOW (ref 14.0–49.0)
MCH: 30.3 pg (ref 27.2–33.4)
MCHC: 33.7 g/dL (ref 32.0–36.0)
MCV: 89.7 fL (ref 79.3–98.0)
MONO#: 0.4 10*3/uL (ref 0.1–0.9)
MONO%: 9.7 % (ref 0.0–14.0)
NEUT#: 3.2 10*3/uL (ref 1.5–6.5)
NEUT%: 76.4 % — AB (ref 39.0–75.0)
Platelets: 248 10*3/uL (ref 140–400)
RBC: 3.8 10*6/uL — AB (ref 4.20–5.82)
RDW: 12.7 % (ref 11.0–14.6)
WBC: 4.2 10*3/uL (ref 4.0–10.3)

## 2016-11-24 LAB — COMPREHENSIVE METABOLIC PANEL
ALBUMIN: 4.1 g/dL (ref 3.5–5.0)
ALK PHOS: 38 U/L — AB (ref 40–150)
ALT: 19 U/L (ref 0–55)
ANION GAP: 9 meq/L (ref 3–11)
AST: 20 U/L (ref 5–34)
BILIRUBIN TOTAL: 0.52 mg/dL (ref 0.20–1.20)
BUN: 24.1 mg/dL (ref 7.0–26.0)
CO2: 28 mEq/L (ref 22–29)
CREATININE: 1.3 mg/dL (ref 0.7–1.3)
Calcium: 9.9 mg/dL (ref 8.4–10.4)
Chloride: 104 mEq/L (ref 98–109)
EGFR: 59 mL/min/{1.73_m2} — AB (ref >90)
GLUCOSE: 101 mg/dL (ref 70–140)
Potassium: 4.3 mEq/L (ref 3.5–5.1)
Sodium: 140 mEq/L (ref 136–145)
TOTAL PROTEIN: 6.9 g/dL (ref 6.4–8.3)

## 2016-11-24 MED ORDER — IOPAMIDOL (ISOVUE-300) INJECTION 61%
INTRAVENOUS | Status: AC
  Filled 2016-11-24: qty 75

## 2016-11-24 MED ORDER — IOPAMIDOL (ISOVUE-300) INJECTION 61%
75.0000 mL | Freq: Once | INTRAVENOUS | Status: AC | PRN
  Administered 2016-11-24: 75 mL via INTRAVENOUS

## 2016-11-24 NOTE — Telephone Encounter (Signed)
Gave patient avs report and appointments for August and October  °

## 2016-11-26 ENCOUNTER — Encounter: Payer: Self-pay | Admitting: Hematology and Oncology

## 2016-11-26 DIAGNOSIS — I890 Lymphedema, not elsewhere classified: Secondary | ICD-10-CM | POA: Insufficient documentation

## 2016-11-26 NOTE — Progress Notes (Signed)
[facility] OFFICE PROGRESS NOTE  Patient Care Team: Tisovec, Fransico Him, MD as PCP - General (Internal Medicine) Leota Sauers, RN as Oncology Nurse Navigator Eppie Gibson, MD as Attending Physician (Radiation Oncology) Heath Lark, MD as Consulting Physician (Hematology and Oncology) Karie Mainland, RD as Dietitian (Nutrition)  SUMMARY OF ONCOLOGIC HISTORY:   Tonsil cancer ([place])   02/07/2016 Imaging    Ct neck showed abnormal soft tissue within the left pharynx, extending from the left tonsillar pillar inferiorly to the level of the hyoid and filling the left vallecula. The appearance is most consistent with a neoplastic process. 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 Histologic sampling and direct visualization should be considered. Confluent adenopathy within the left cervical chain with areas of central hypoattenuation suggesting degrees of necrosis. This is most suggestive of metastatic spread. The areas of necrosis within the adenopathy combined with the above-described pharyngeal soft tissue mass are together suggestive of squamous cell carcinoma.      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 Borderline hypermetabolic small right station IIb lymph node. Several tiny lung nodules are observed, these are not hypermetabolic but are also below the threshold level for accurate characterization. Dedicated CT chest may be warranted to act as a baseline.      03/20/2016 Procedure    Placement of a right internal jugular approach power injectable Port-A-Cath. Fluoroscopic insertion of a 20-French pull-through gastrostomy tube.        03/24/2016 - 05/06/2016 Chemotherapy    He received high dose cisplatin; dose 2 and 3 with dose reduction due to side-effects      03/24/2016 - 05/15/2016 Radiation Therapy    He received concurrent radiation      09/04/2016 Imaging    1. Significant response to therapy of left oropharyngeal/palatine tonsil primary. Low-level residual hypermetabolism could be treatment related or represent residual disease. 2. Resolution of cervical adenopathy. Low-level hypermetabolism within only 1 level 2 node remains. 3. No evidence of hypermetabolic extra cervical disease. 4. Age advanced coronary artery atherosclerosis. Recommend assessment of coronary risk factors and consideration of medical therapy. 5. Progressive sinus disease. 6. Bilateral pulmonary nodules. A right lower lobe nodule may be new. Below PET resolution. Recommend attention on follow-up.       11/24/2016 Imaging    1. Post radiation changes in the neck. Left tonsillar mass and malignant lymphadenopathy appear resolved by CT, with residual 7-8 mm short axis bilateral level 2 lymph nodes, rim calcified on the left. 2. Interval bilateral maxilla wisdom tooth extractions with new inflammation in the right maxillary sinus which appears related to the tooth extraction site. Recommend ENT consultation. 3. Chest CT today is reported separately.      11/24/2016 Imaging    1. 3 mm posterior right lower lobe pulmonary nodule seen on the previous PET-CT has resolved completely in the interval. With 3 mm nodule in the posterior left lower lobe is unchanged. Continued attention on follow-up recommended. 2. No new or progressive findings on today's exam       INTERVAL HISTORY: Please see below for problem oriented charting. He returns for further follow-up He  feels well. Denies recent infection He is working full-time. He is dependent on feeding tube at night to keep up with his nutritional intake and his weight.  He denies significant dysphagia.   He has persistent dry mouth.  REVIEW OF SYSTEMS:   Constitutional: Denies fevers, chills or abnormal weight loss Eyes: Denies blurriness of vision Ears, nose, mouth, throat, and face: Denies mucositis or sore throat Respiratory: Denies cough, dyspnea or wheezes Cardiovascular: Denies palpitation, chest discomfort or lower extremity swelling Gastrointestinal:  Denies nausea, heartburn or change in bowel habits Skin: Denies abnormal skin rashes Lymphatics: Denies new lymphadenopathy or easy bruising Neurological:Denies numbness, tingling or new weaknesses Behavioral/Psych: Mood is stable, no new changes  All other systems were reviewed with the patient and are negative.  I have reviewed the past medical history, past surgical history, social history and family history with the patient and they are unchanged from previous note.  ALLERGIES:  is allergic to phenergan [promethazine hcl].  MEDICATIONS:  Current Outpatient Prescriptions  Medication Sig Dispense Refill  . fenofibrate (TRICOR) 145 MG tablet Take 145 mg by mouth daily.     [name] levothyroxine (SYNTHROID, LEVOTHROID) 50 MCG tablet Take 50 mcg by mouth daily before breakfast.     . Nutritional Supplements (FEEDING SUPPLEMENT, OSMOLITE 1.5 CAL,) LIQD Run Osmolite 1.5 at 100 ML/hr for 12 hours via PEG. Give one can BID daily with 120 cc free water before and after bolus feedings and continuous feeding. 1659 mL 0  . sodium fluoride (FLUORISHIELD) 1.1 % GEL dental gel Instill one drop of gel per tooth space of fluoride tray. Place over teeth for 5 minutes. Remove. Spit out excess. Repeat nightly. 120 mL prn   No current facility-administered medications for this visit.     PHYSICAL EXAMINATION: ECOG PERFORMANCE STATUS: 1 - Symptomatic but completely ambulatory  Vitals:   11/24/16 1443  BP: 126/69  Pulse: (!) 59  Resp: 18  Temp: 98 F (36.7 C)   Filed Weights   11/24/16 1443  Weight: 174 lb 9.6 oz (79.2 kg)    GENERAL:alert,  no distress and comfortable SKIN: skin color, texture, turgor are normal, no rashes or significant lesions EYES: normal, Conjunctiva are pink and non-injected, sclera clear OROPHARYNX:no exudate, no erythema and lips, buccal mucosa, and tongue normal  NECK: Noted mild lymphedema. LYMPH:  no palpable lymphadenopathy in the cervical, axillary or inguinal LUNGS: clear to auscultation and percussion with normal breathing effort HEART: regular rate & rhythm and no murmurs and no lower extremity edema ABDOMEN:abdomen soft, non-tender and normal bowel sounds.  Feeding tube site looks okay Musculoskeletal:no cyanosis of digits and no clubbing  NEURO: alert & oriented x 3 with fluent speech, no focal motor/sensory deficits  LABORATORY DATA:  I have reviewed the data as listed    Component Value Date/Time   NA 140 11/24/2016 1108   K 4.3 11/24/2016 1108   CL 96 (L) 05/11/2016 1608   CO2 28 11/24/2016 1108   GLUCOSE 101 11/24/2016 1108   BUN 24.1 11/24/2016 1108   CREATININE 1.3 11/24/2016 1108   CALCIUM 9.9 11/24/2016 1108   PROT 6.9 11/24/2016 1108   ALBUMIN 4.1 11/24/2016 1108   AST 20 11/24/2016 1108   ALT 19 11/24/2016 1108   ALKPHOS 38 (L) 11/24/2016 1108   BILITOT 0.52 11/24/2016 1108   GFRNONAA >60 05/11/2016 1608   GFRAA >60 05/11/2016 1608    No results found for: SPEP, UPEP  Lab Results  Component Value Date   WBC  4.2 11/24/2016   NEUTROABS 3.2 11/24/2016   HGB 11.5 (L) 11/24/2016   HCT 34.1 (L) 11/24/2016   MCV 89.7 11/24/2016   PLT 248 11/24/2016      Chemistry      Component Value Date/Time   NA 140 11/24/2016 1108   K 4.3 11/24/2016 1108   CL 96 (L) 05/11/2016 1608   CO2 28 11/24/2016 1108   BUN 24.1 11/24/2016 1108   CREATININE 1.3 11/24/2016 1108      Component Value Date/Time   CALCIUM 9.9 11/24/2016 1108   ALKPHOS 38 (L) 11/24/2016 1108   AST 20 11/24/2016 1108   ALT 19 11/24/2016 1108   BILITOT 0.52 11/24/2016 1108       RADIOGRAPHIC  STUDIES: I have personally reviewed the radiological images as listed and agreed with the findings in the report. 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 Restaging. EXAM: CT NECK WITH CONTRAST TECHNIQUE: Multidetector CT imaging of the neck was performed using the standard protocol following the bolus administration of intravenous contrast. CONTRAST:  69mL ISOVUE-300 IOPAMIDOL (ISOVUE-300) INJECTION 61% in conjunction with contrast enhanced imaging of the chest reported separately. COMPARISON:  Chest CT today reported separately. PET-CT 09/04/2016. Neck CT 02/07/2016 FINDINGS: Pharynx and larynx: Mild generalized pharyngeal in mucosal space soft tissue thickening. Small volume retained secretions in the nasopharynx and hypopharynx. There is no longer asymmetric masslike soft tissue along the left tonsillar pillar. The larynx aside from epiglottic thickening is within normal limits. Salivary glands: Post radiation changes to the submandibular glands which are smaller and hyperenhancing. Negative sublingual space. Negative parotid glands. Thyroid: Negative. Lymph nodes: Regressed bulky left neck lymphadenopathy with small 7 mm residual rim calcified left level IIa node on series 9, image 58. Right level IIa nodal tissue has also decreased, with indistinct 7-8 mm residual. No new or increased cervical nodes. Vascular: Right IJ approach chest porta cath in place. Major vascular structures in the neck and at the skullbase are patent. Limited intracranial: Negative. Visualized orbits: Negative. Mastoids and visualized paranasal sinuses: Mucosal thickening and fluid level in the right maxillary sinus. Increased left maxillary alveolar recess mucosal thickening. At both sites posterior molar and/or wisdom tooth extractions have occurred, and the tooth  extraction sites appear to communicate with the maxillary alveolar recesses, more so the right (series 13, image 44). The visible sphenoid and ethmoid sinuses remain clear. Bilateral tympanic cavities and mastoids remain clear. Skeleton: See maxilla tooth extraction site details above. Degenerative changes in the cervical spine. No osseous metastatic disease identified in the neck. Upper chest: Chest findings today are reported separately. IMPRESSION: 1. Post radiation changes in the neck. Left tonsillar mass and malignant lymphadenopathy appear resolved by CT, with residual 7-8 mm short axis bilateral level 2 lymph nodes, rim calcified on the left. 2. Interval bilateral maxilla wisdom tooth extractions with new inflammation in the right maxillary sinus which appears related to the tooth extraction site. Recommend ENT consultation. 3. Chest CT today is reported separately. Electronically Signed   By: Genevie Ann M.D.   On: 11/24/2016 13:11   Ct Chest W Contrast  Result Date: 11/24/2016 CLINICAL DATA:  Left tonsillar cancer. Pulmonary nodule seen on PET-CT. EXAM: CT CHEST WITH CONTRAST TECHNIQUE: Multidetector CT imaging of the chest was performed during intravenous contrast administration. CONTRAST:  13mL ISOVUE-300 IOPAMIDOL (ISOVUE-300) INJECTION 61% COMPARISON:  PET-CT  09/04/2016. FINDINGS: Cardiovascular: The heart size is normal. No pericardial effusion. Coronary artery calcification is noted. Right Port-A-Cath tip is in the distal SVC just proximal to the RA. Mediastinum/Nodes: No mediastinal lymphadenopathy. There is no hilar lymphadenopathy. The esophagus has normal imaging features. There is no axillary lymphadenopathy. Lungs/Pleura: Posterior right lower lobe pulmonary nodule seen on the prior study has resolved completely in the interval. 3 mm left lower lobe pulmonary nodule is stable in the interval (image 93 series 6). No focal airspace consolidation. No pulmonary edema or pleural effusion. Upper  Abdomen: Gastrostomy tube noted distal stomach. Visualized portions of the upper abdomen otherwise unremarkable. Musculoskeletal: Bone windows reveal no worrisome lytic or sclerotic osseous lesions. IMPRESSION: 1. 3 mm posterior right lower lobe pulmonary nodule seen on the previous PET-CT has resolved completely in the interval. With 3 mm nodule in the posterior left lower lobe is unchanged. Continued attention on follow-up recommended. 2. No new or progressive findings on today's exam. Electronically Signed   By: Misty Stanley M.D.   On: 11/24/2016 15:17    ASSESSMENT & PLAN:  Tonsil cancer ([place]) He has almost completely recovered from all side effects of treatment. I reinforced importance of close ENT follow-up.  He will make appointment to schedule return visit to see his ENT doctor CT scan in July showed complete resolution of disease However, he is not ready to have feeding tube removed.  I will get his port flushed.  I encouraged him to increase oral intake as tolerated with plan to remove the feeding tube in the future I will see him back in in 3 months for further evaluation.  Dysphagia He has persistent dysphagia I recommend speech and language therapist evaluation for further assessment of his swallowing. I am hopeful we can remove the feeding tube by the end of the year.  Acquired lymphedema He has acquired lymphedema. He has assessment and physical therapist evaluation at the lymphedema clinic.   No orders of the defined types were placed in this encounter.  All questions were answered. The patient knows to call the clinic with any problems, questions or concerns. No barriers to learning was detected. I spent 15 minutes counseling the patient face to face. The total time spent in the appointment was 20 minutes and more than 50% was on counseling and review of test results     Heath Lark, MD 11/26/2016 4:42 PM

## 2016-11-26 NOTE — Assessment & Plan Note (Signed)
He has almost completely recovered from all side effects of treatment. I reinforced importance of close ENT follow-up.  He will make appointment to schedule return visit to see his ENT doctor CT scan in July showed complete resolution of disease However, he is not ready to have feeding tube removed.  I will get his port flushed.  I encouraged him to increase oral intake as tolerated with plan to remove the feeding tube in the future I will see him back in in 3 months for further evaluation.

## 2016-11-26 NOTE — Assessment & Plan Note (Signed)
He has acquired lymphedema. He has assessment and physical therapist evaluation at the lymphedema clinic.

## 2016-11-26 NOTE — Assessment & Plan Note (Signed)
He has persistent dysphagia I recommend speech and language therapist evaluation for further assessment of his swallowing. I am hopeful we can remove the feeding tube by the end of the year.

## 2016-12-05 ENCOUNTER — Ambulatory Visit: Payer: BLUE CROSS/BLUE SHIELD | Admitting: *Deleted

## 2016-12-08 ENCOUNTER — Ambulatory Visit: Payer: Self-pay | Admitting: Hematology and Oncology

## 2016-12-08 ENCOUNTER — Other Ambulatory Visit: Payer: Self-pay

## 2016-12-10 DIAGNOSIS — C029 Malignant neoplasm of tongue, unspecified: Secondary | ICD-10-CM | POA: Diagnosis not present

## 2017-01-02 ENCOUNTER — Ambulatory Visit: Payer: Self-pay

## 2017-01-08 ENCOUNTER — Telehealth: Payer: Self-pay | Admitting: *Deleted

## 2017-01-08 ENCOUNTER — Other Ambulatory Visit: Payer: Self-pay | Admitting: Hematology and Oncology

## 2017-01-08 DIAGNOSIS — C099 Malignant neoplasm of tonsil, unspecified: Secondary | ICD-10-CM

## 2017-01-08 NOTE — Telephone Encounter (Signed)
Oncology Nurse Navigator Documentation  Received call from patient's wife with request for PAC and PEG removal. She reported:  His oral intake has been 100% for past 1-2 months, has not been using PEG but continues to flush.  Maintaining weight 175-180 lbs. She requested she be contacted to arrange appt. Dr. Alvy Bimler notified.  Gayleen Orem, RN, BSN, [place] [facility] On[facility] Nurse [place] [facility] [place] [phone]

## 2017-01-08 NOTE — Telephone Encounter (Signed)
Orders are in. IR will call him directly

## 2017-01-10 DIAGNOSIS — C029 Malignant neoplasm of tongue, unspecified: Secondary | ICD-10-CM | POA: Diagnosis not present

## 2017-01-19 DIAGNOSIS — H9313 Tinnitus, bilateral: Secondary | ICD-10-CM | POA: Diagnosis not present

## 2017-01-19 DIAGNOSIS — C109 Malignant neoplasm of oropharynx, unspecified: Secondary | ICD-10-CM | POA: Diagnosis not present

## 2017-01-19 DIAGNOSIS — H903 Sensorineural hearing loss, bilateral: Secondary | ICD-10-CM | POA: Diagnosis not present

## 2017-01-19 IMAGING — CT CT ABD-PELV W/ CM
2 of 5 series · 16 of 46 positions shown, 18 images · IV contrast (ISOVUE)
Comparison: PET-CT 02/26/2016.

CLINICAL DATA: Patient with constipation and indigestion.

EXAM:
CT ABDOMEN AND PELVIS WITH CONTRAST
TECHNIQUE: Multidetector CT imaging of the abdomen and pelvis was performed
using the standard protocol following bolus administration of
intravenous contrast.
CONTRAST:  100mL [id number] IOPAMIDOL ([id number]) INJECTION 61%

[Series 2: abd/pel with · axial · 0.86mm/px · z∈[-356,+64]mm · 13 of 96 slices shown, 15 images]
[im 6/96  soft-tissue]
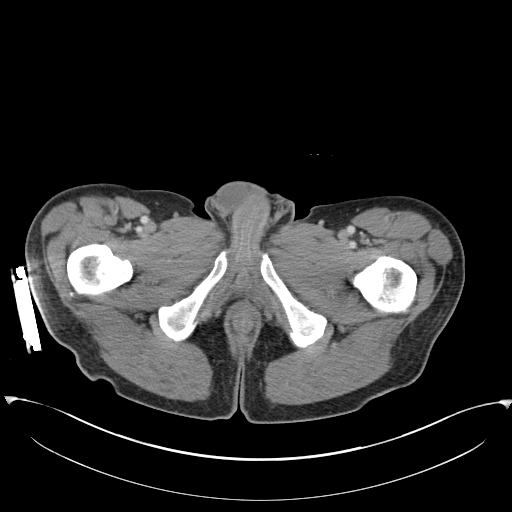
[im 6/96  bone]
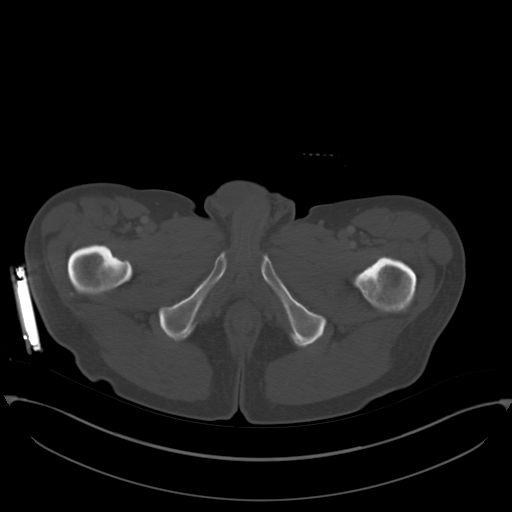
[im 12/96  soft-tissue]
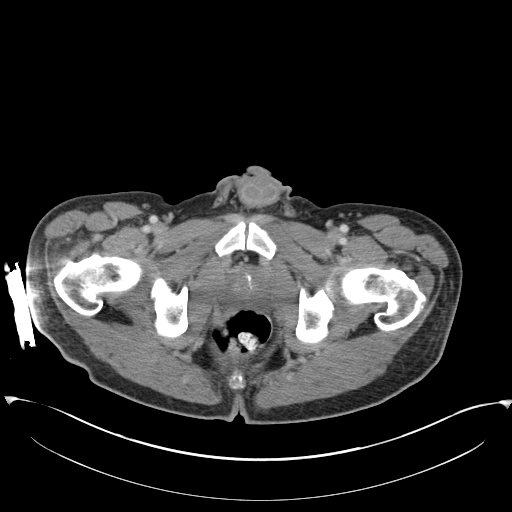
[im 18/96  soft-tissue]
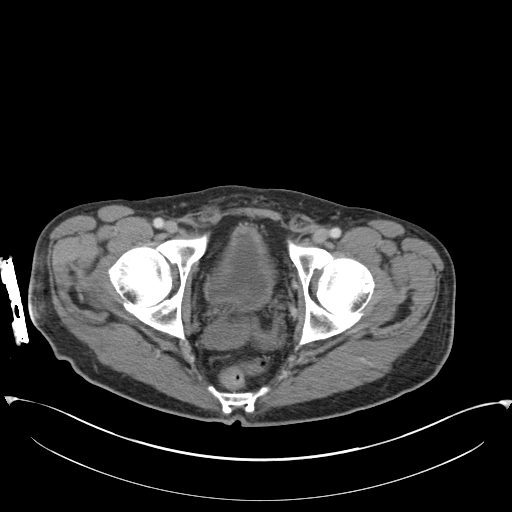
[im 30/96  soft-tissue]
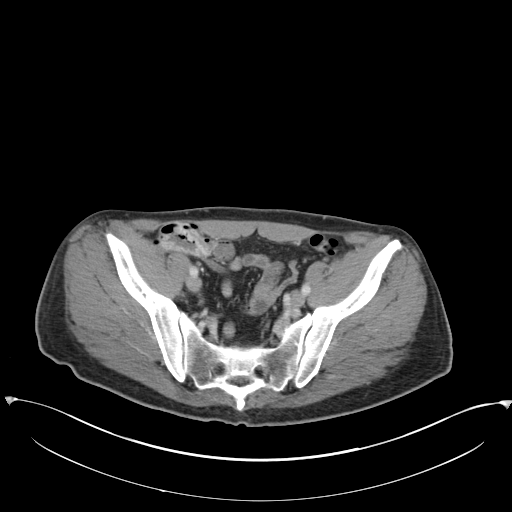
[im 36/96  soft-tissue]
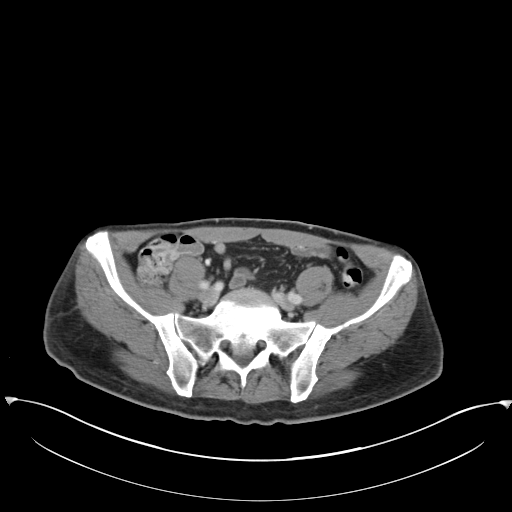
[im 42/96  soft-tissue]
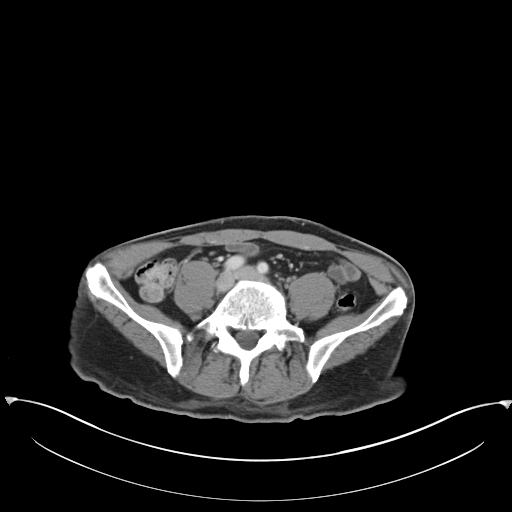
[im 48/96  soft-tissue]
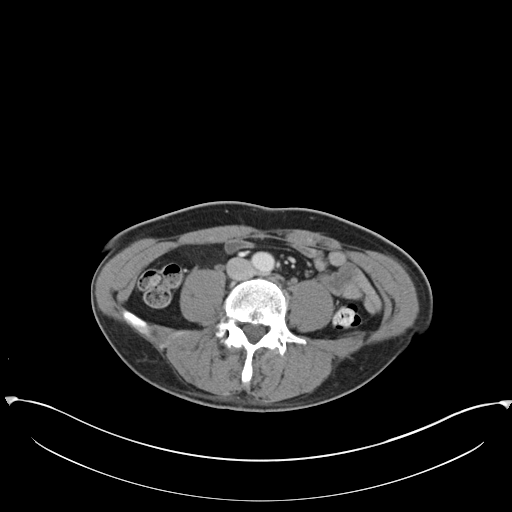
[im 54/96  soft-tissue]
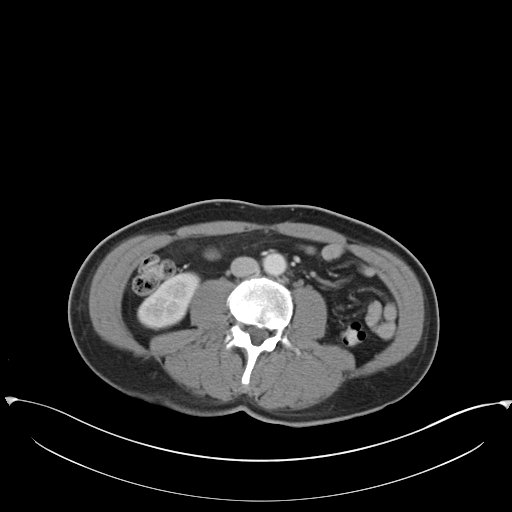
[im 60/96  soft-tissue]
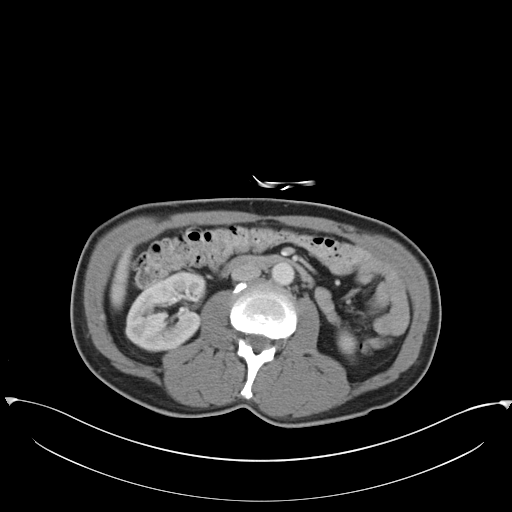
[im 60/96  bone]
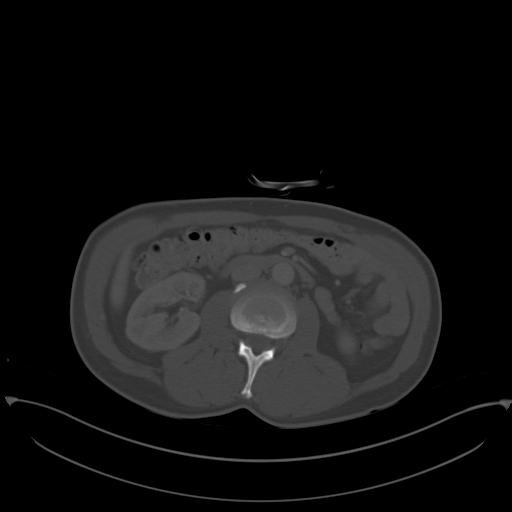
[im 66/96  soft-tissue]
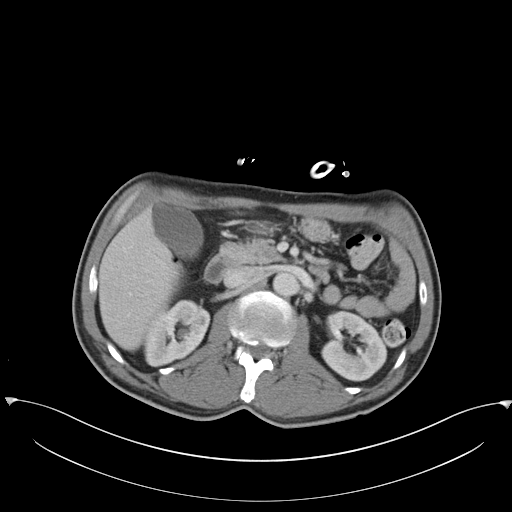
[im 78/96  soft-tissue]
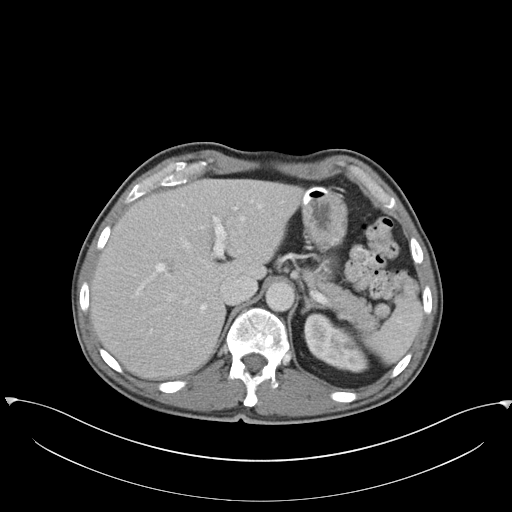
[im 84/96  soft-tissue]
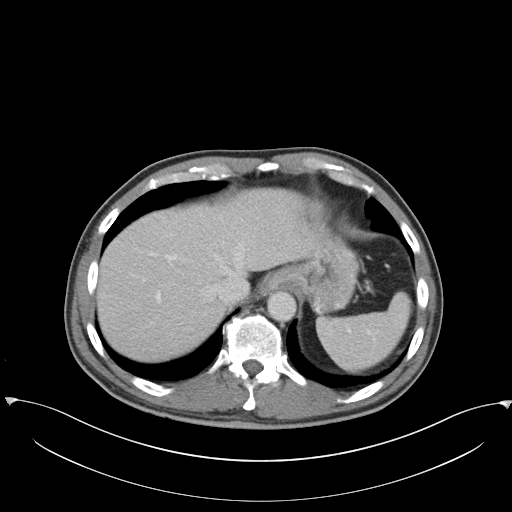
[im 90/96  soft-tissue]
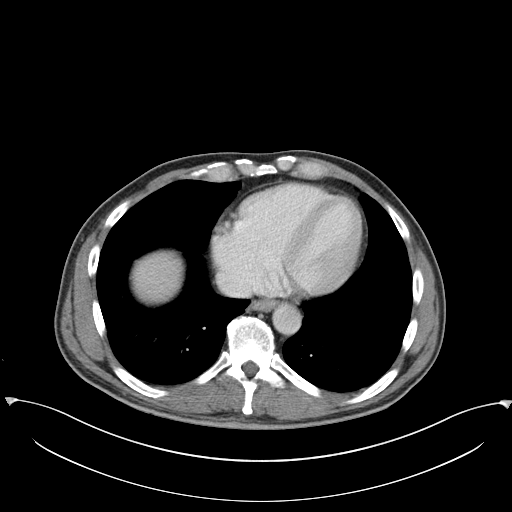

[Series 4: coronal a/|p · coronal · 0.74mm/px · 3 of 95 slices shown]
[im 32/95  soft-tissue]
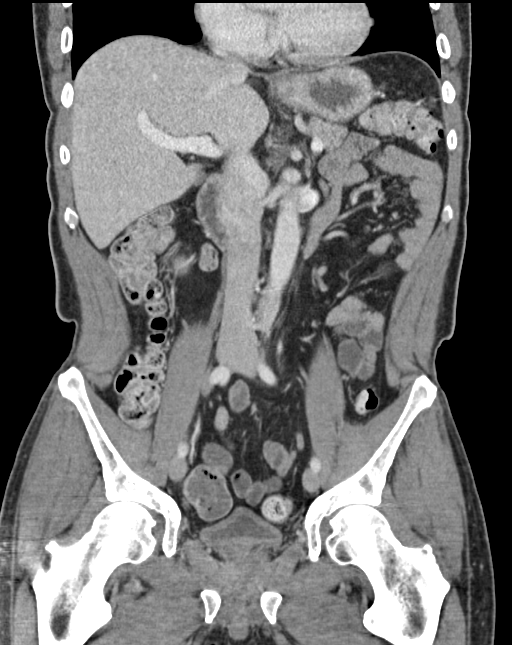
[im 42/95  soft-tissue]
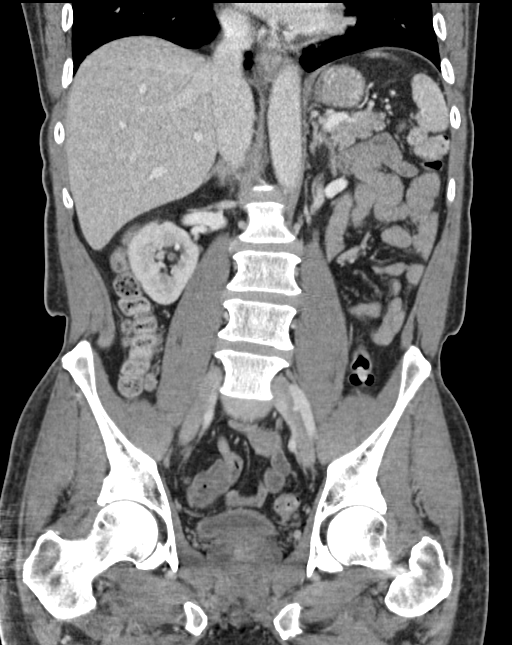
[im 53/95  soft-tissue]
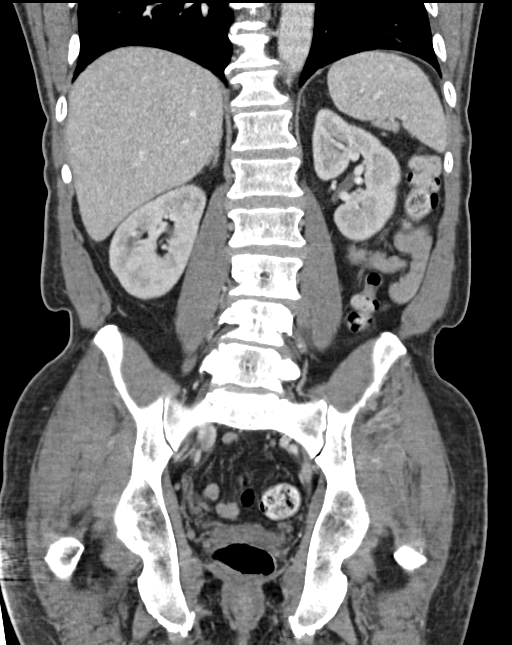

[16 of 46 positions shown; findings below may reference images not displayed]

FINDINGS: Lower chest: Normal heart size. No consolidative pulmonary
opacities. No pleural effusion. Minimal atelectasis within the lung
bases.

Hepatobiliary: Liver is normal in size and contour. 5 mm too small
to characterize low-attenuation lesion right hepatic lobe (image 27;
series 2). Gallbladder is unremarkable. No intrahepatic or
extrahepatic biliary ductal dilatation.

Pancreas: Unremarkable

Spleen: Indeterminate 10 mm enhancing lesion within the spleen
(image 17; series 2).

Adrenals/Urinary Tract: The adrenal glands are normal. Kidneys
enhance symmetrically with contrast. No hydronephrosis. Urinary
bladder is decompressed.

Stomach/Bowel: Sigmoid colonic diverticulosis. No CT evidence for
acute diverticulitis. Percutaneous gastrostomy tube present and
appears appropriately located. Normal morphology of the stomach. No
evidence for bowel obstruction. No free fluid or free
intraperitoneal air.

Vascular/Lymphatic: Normal caliber abdominal aorta. No
retroperitoneal lymphadenopathy.

Reproductive: Prostate is enlarged. Central dystrophic
calcifications.

Other: No abdominal wall hernia or abnormality. No abdominopelvic
ascites.

Musculoskeletal: Lower thoracic and lumbar spine degenerative
changes. No aggressive or acute appearing osseous lesions.
IMPRESSION: No acute process within the abdomen or pelvis.

Indeterminate 10 mm enhancing lesion within the spleen. Recommend
attention on follow-up.

## 2017-01-19 IMAGING — CR DG CHEST 2V
2 series · 2 of 2 positions shown · non-contrast
Comparison: None.

CLINICAL DATA: No bowel movement in 4 days.  Vomiting.

EXAM:
CHEST  2 VIEW

[w chest pa]
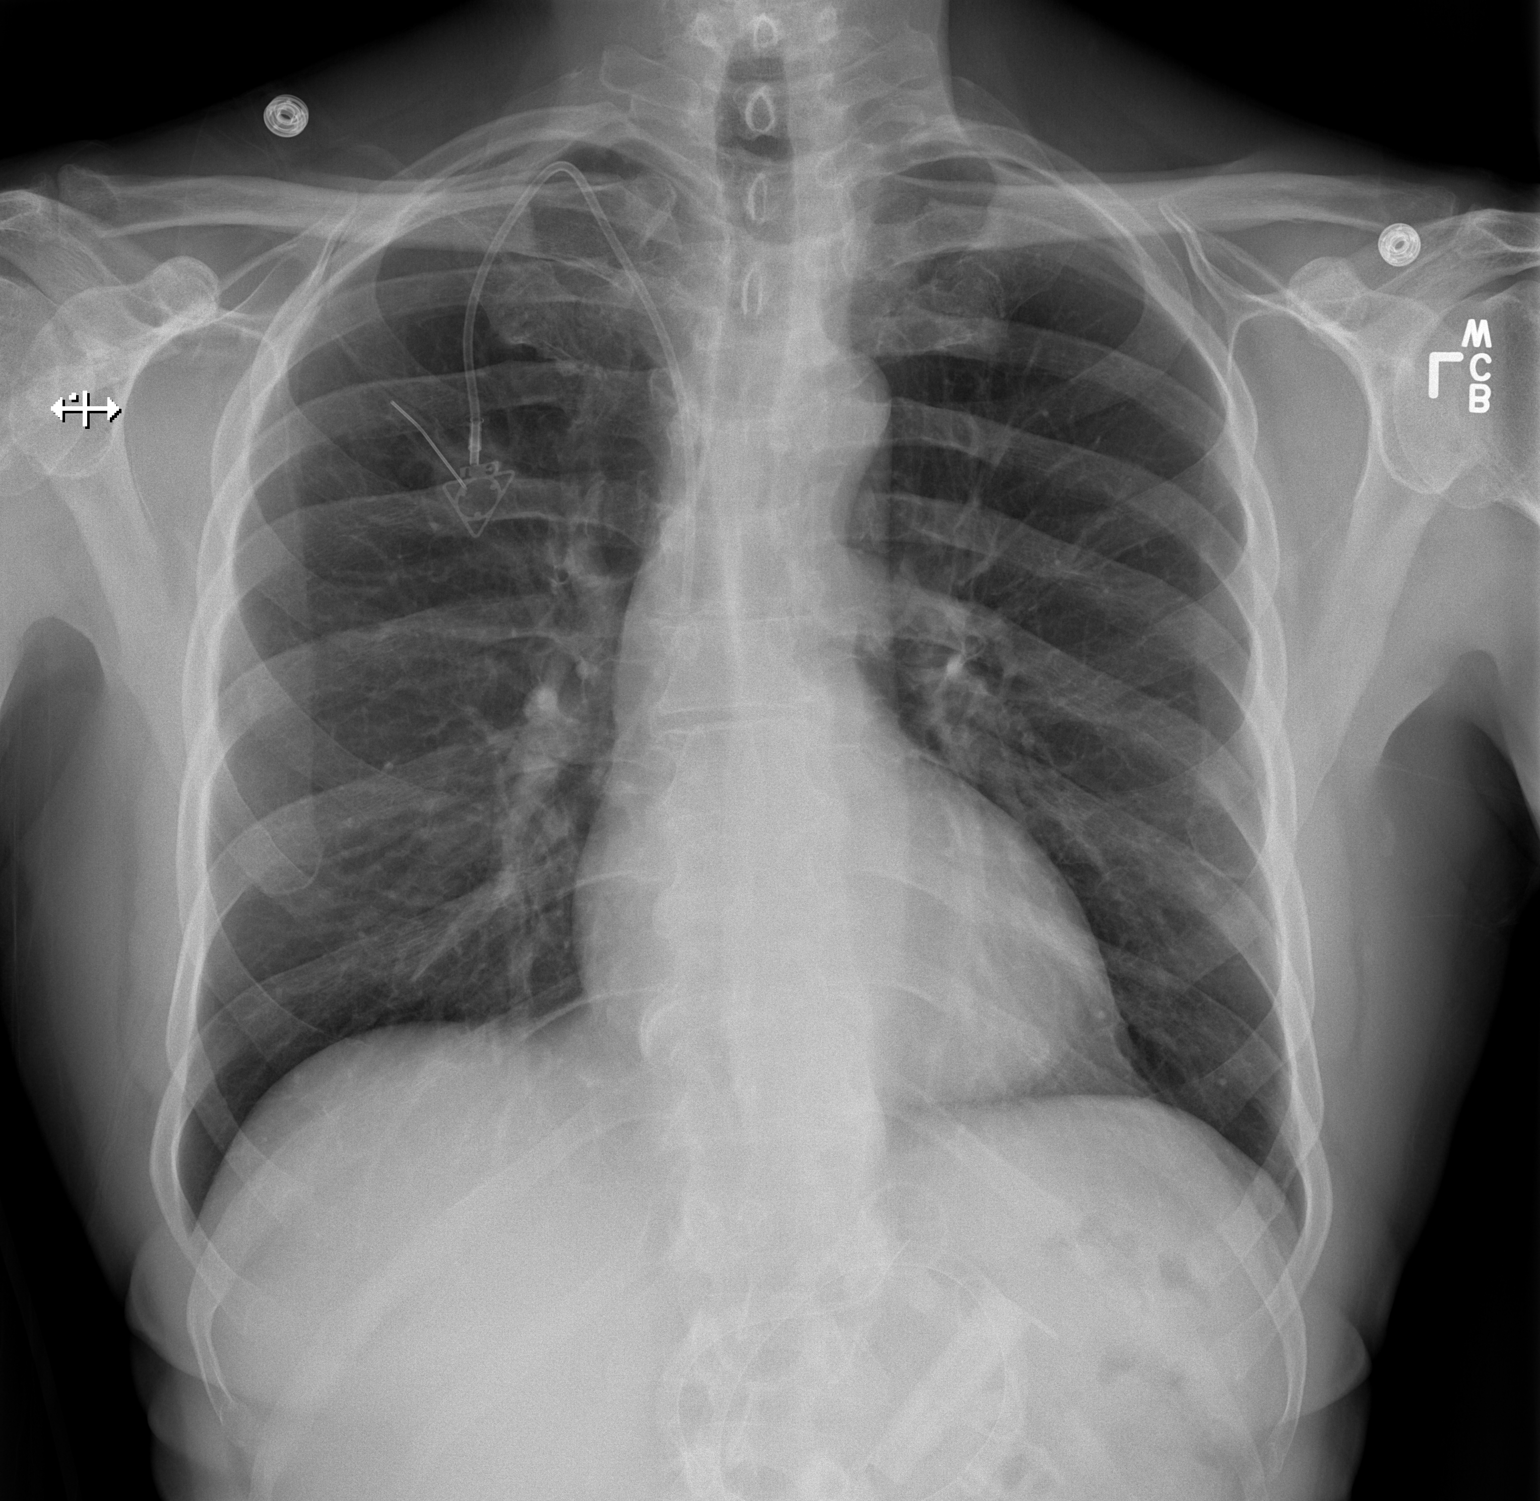

[w chest lat]
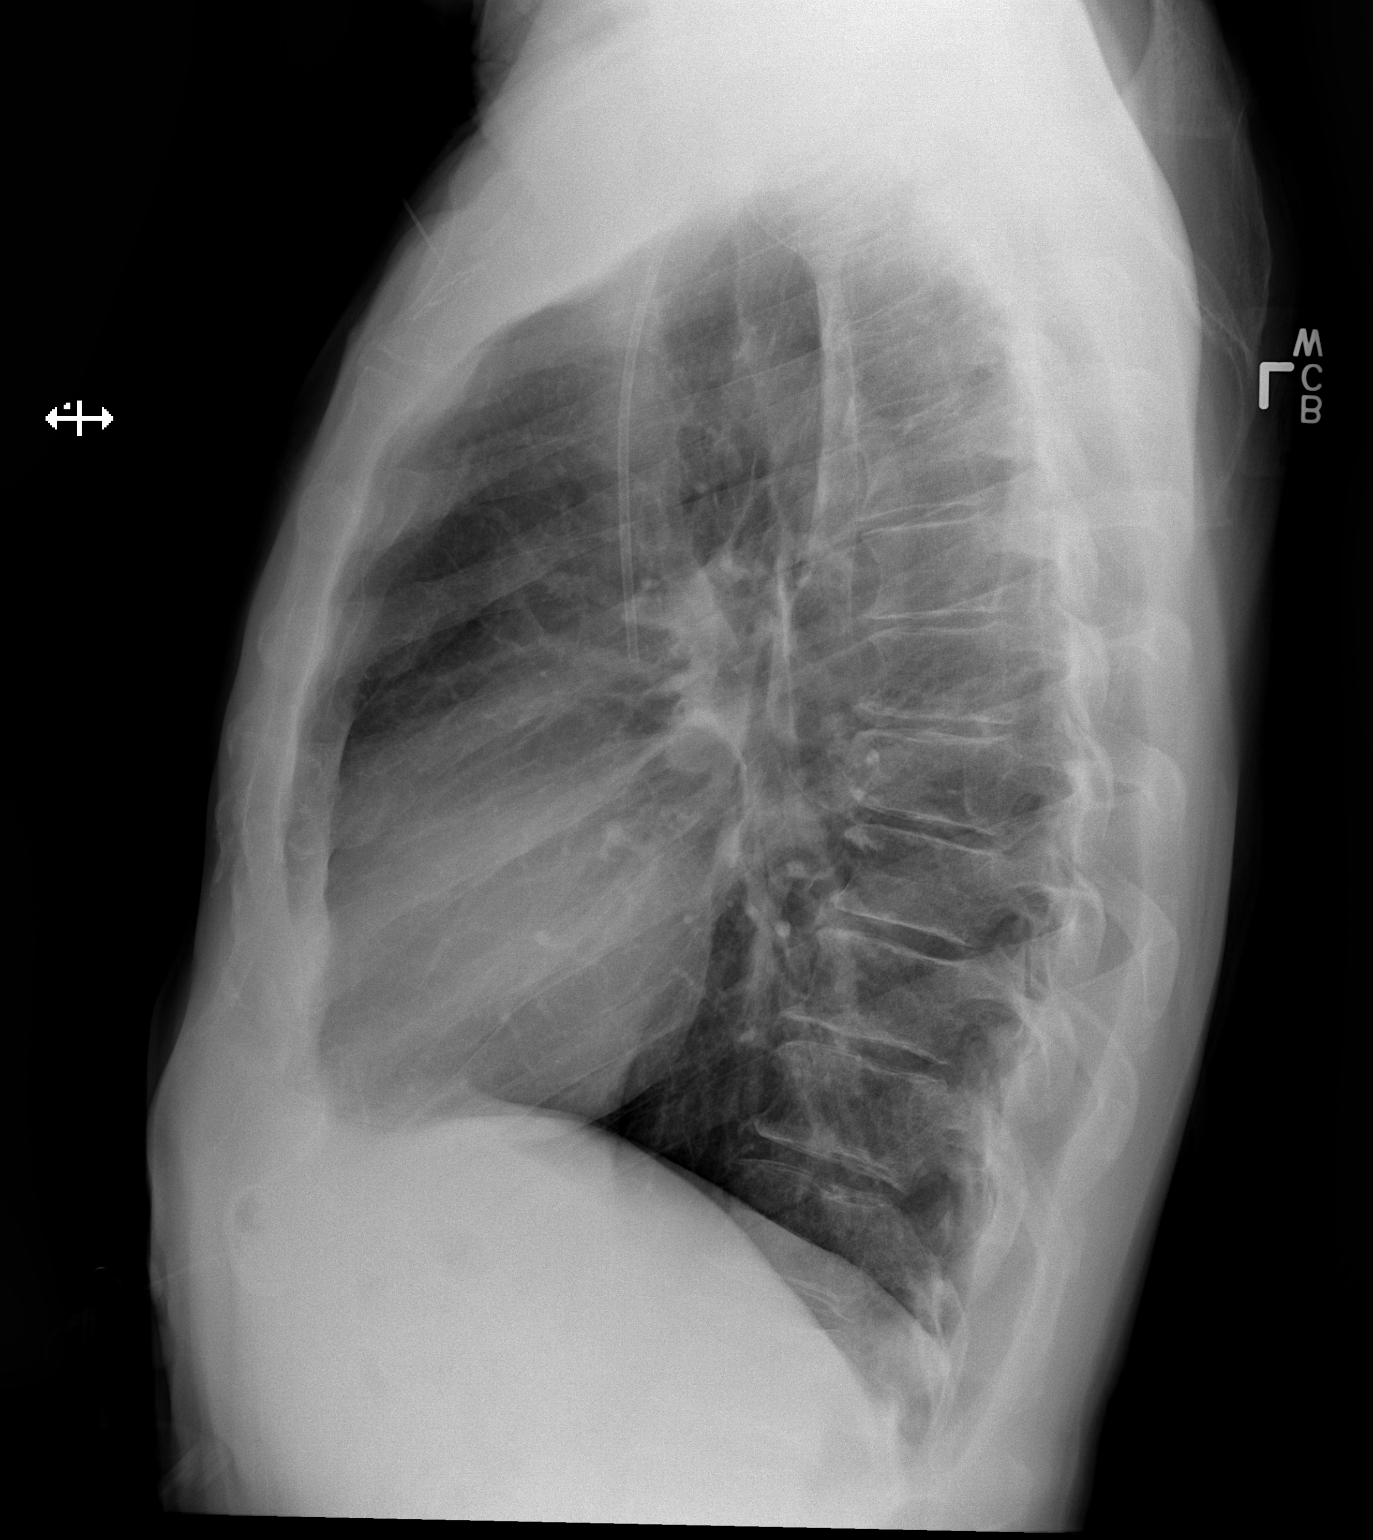

[2 of 2 positions shown; findings below may reference images not displayed]

FINDINGS: Right Port-A-Cath is in place with tip in the SVC. Heart and
mediastinal contours are within normal limits. No focal opacities or
effusions. No acute bony abnormality.
IMPRESSION: No active cardiopulmonary disease.

## 2017-01-28 ENCOUNTER — Other Ambulatory Visit: Payer: Self-pay | Admitting: Radiology

## 2017-01-29 ENCOUNTER — Other Ambulatory Visit: Payer: Self-pay | Admitting: Student

## 2017-01-29 ENCOUNTER — Other Ambulatory Visit: Payer: Self-pay | Admitting: Radiology

## 2017-01-30 ENCOUNTER — Ambulatory Visit (HOSPITAL_COMMUNITY)
Admission: RE | Admit: 2017-01-30 | Discharge: 2017-01-30 | Disposition: A | Discharge status: Home / Self Care | Payer: BLUE CROSS/BLUE SHIELD | Source: Ambulatory Visit | Attending: Hematology and Oncology | Admitting: Hematology and Oncology

## 2017-01-30 ENCOUNTER — Encounter (HOSPITAL_COMMUNITY): Payer: Self-pay

## 2017-01-30 DIAGNOSIS — Z452 Encounter for adjustment and management of vascular access device: Secondary | ICD-10-CM | POA: Diagnosis not present

## 2017-01-30 DIAGNOSIS — I10 Essential (primary) hypertension: Secondary | ICD-10-CM | POA: Diagnosis not present

## 2017-01-30 DIAGNOSIS — E039 Hypothyroidism, unspecified: Secondary | ICD-10-CM | POA: Insufficient documentation

## 2017-01-30 DIAGNOSIS — C099 Malignant neoplasm of tonsil, unspecified: Secondary | ICD-10-CM

## 2017-01-30 DIAGNOSIS — Z923 Personal history of irradiation: Secondary | ICD-10-CM | POA: Insufficient documentation

## 2017-01-30 DIAGNOSIS — Z8589 Personal history of malignant neoplasm of other organs and systems: Secondary | ICD-10-CM | POA: Diagnosis not present

## 2017-01-30 DIAGNOSIS — Z87891 Personal history of nicotine dependence: Secondary | ICD-10-CM | POA: Insufficient documentation

## 2017-01-30 DIAGNOSIS — Z85818 Personal history of malignant neoplasm of other sites of lip, oral cavity, and pharynx: Secondary | ICD-10-CM | POA: Diagnosis not present

## 2017-01-30 DIAGNOSIS — E785 Hyperlipidemia, unspecified: Secondary | ICD-10-CM | POA: Diagnosis not present

## 2017-01-30 HISTORY — PX: IR REMOVAL TUN ACCESS W/ PORT W/O FL MOD SED: IMG2290

## 2017-01-30 HISTORY — PX: IR GASTROSTOMY TUBE REMOVAL: IMG5492

## 2017-01-30 LAB — APTT: aPTT: 29 seconds (ref 24–36)

## 2017-01-30 LAB — BASIC METABOLIC PANEL
Anion gap: 9 (ref 5–15)
BUN: 26 mg/dL — AB (ref 6–20)
CHLORIDE: 103 mmol/L (ref 101–111)
CO2: 27 mmol/L (ref 22–32)
Calcium: 9.7 mg/dL (ref 8.9–10.3)
Creatinine, Ser: 1.32 mg/dL — ABNORMAL HIGH (ref 0.61–1.24)
GFR calc Af Amer: >60 mL/min (ref >60)
GFR, EST NON AFRICAN AMERICAN: 59 mL/min — AB (ref >60)
GLUCOSE: 101 mg/dL — AB (ref 65–99)
POTASSIUM: 3.9 mmol/L (ref 3.5–5.1)
Sodium: 139 mmol/L (ref 135–145)

## 2017-01-30 LAB — CBC
HCT: 36.7 % — ABNORMAL LOW (ref 39.0–52.0)
Hemoglobin: 12.5 g/dL — ABNORMAL LOW (ref 13.0–17.0)
MCH: 29.4 pg (ref 26.0–34.0)
MCHC: 34.1 g/dL (ref 30.0–36.0)
MCV: 86.4 fL (ref 78.0–100.0)
PLATELETS: 271 10*3/uL (ref 150–400)
RBC: 4.25 MIL/uL (ref 4.22–5.81)
RDW: 12.5 % (ref 11.5–15.5)
WBC: 2.6 10*3/uL — AB (ref 4.0–10.5)

## 2017-01-30 LAB — PROTIME-INR
INR: 0.99
PROTHROMBIN TIME: 13 s (ref 11.4–15.2)

## 2017-01-30 MED ORDER — LIDOCAINE VISCOUS 2 % MT SOLN
OROMUCOSAL | Status: AC
  Filled 2017-01-30: qty 15

## 2017-01-30 MED ORDER — FENTANYL CITRATE (PF) 100 MCG/2ML IJ SOLN
INTRAMUSCULAR | Status: AC
  Filled 2017-01-30: qty 4

## 2017-01-30 MED ORDER — SODIUM CHLORIDE 0.9 % IV SOLN
INTRAVENOUS | Status: DC
  Administered 2017-01-30: 08:00:00 via INTRAVENOUS

## 2017-01-30 MED ORDER — FENTANYL CITRATE (PF) 100 MCG/2ML IJ SOLN
INTRAMUSCULAR | Status: AC | PRN
Start: 2017-01-30 — End: 2017-01-30
  Administered 2017-01-30: 50 ug via INTRAVENOUS

## 2017-01-30 MED ORDER — MIDAZOLAM HCL 2 MG/2ML IJ SOLN
INTRAMUSCULAR | Status: AC | PRN
  Administered 2017-01-30: 1 mg via INTRAVENOUS

## 2017-01-30 MED ORDER — MIDAZOLAM HCL 2 MG/2ML IJ SOLN
INTRAMUSCULAR | Status: AC
Start: 2017-01-30 — End: 2017-01-30
  Filled 2017-01-30: qty 4

## 2017-01-30 MED ORDER — CEFAZOLIN SODIUM-DEXTROSE 2-4 GM/100ML-% IV SOLN
2.0000 g | INTRAVENOUS | Status: AC
  Administered 2017-01-30: 2 g via INTRAVENOUS

## 2017-01-30 MED ORDER — LIDOCAINE-EPINEPHRINE (PF) 2 %-1:200000 IJ SOLN
INTRAMUSCULAR | Status: AC
  Filled 2017-01-30: qty 20

## 2017-01-30 MED ORDER — CEFAZOLIN SODIUM-DEXTROSE 2-4 GM/100ML-% IV SOLN
INTRAVENOUS | Status: AC
  Filled 2017-01-30: qty 100

## 2017-01-30 NOTE — Discharge Instructions (Signed)
Implanted Port Removal, Care After °Refer to this sheet in the next few weeks. These instructions provide you with information about caring for yourself after your procedure. Your health care provider may also give you more specific instructions. Your treatment has been planned according to current medical practices, but problems sometimes occur. Call your health care provider if you have any problems or questions after your procedure. °What can I expect after the procedure? °After the procedure, it is common to have: °· Soreness or pain near your incision. °· Some swelling or bruising near your incision. ° °Follow these instructions at home: °Medicines °· Take over-the-counter and prescription medicines only as told by your health care provider. °· If you were prescribed an antibiotic medicine, take it as told by your health care provider. Do not stop taking the antibiotic even if you start to feel better. °Bathing °· Do not take baths, swim, or use a hot tub until your health care provider approves. Ask your health care provider if you can take showers. You may only be allowed to take sponge baths for bathing. °Incision care °· Follow instructions from your health care provider about how to take care of your incision. Make sure you: °? Wash your hands with soap and water before you change your bandage (dressing). If soap and water are not available, use hand sanitizer. °? Change your dressing as told by your health care provider. °? Keep your dressing dry. °? Leave stitches (sutures), skin glue, or adhesive strips in place. These skin closures may need to stay in place for 2 weeks or longer. If adhesive strip edges start to loosen and curl up, you may trim the loose edges. Do not remove adhesive strips completely unless your health care provider tells you to do that. °· Check your incision area every day for signs of infection. Check for: °? More redness, swelling, or pain. °? More fluid or  blood. °? Warmth. °? Pus or a bad smell. °Driving °· If you received a sedative, do not drive for 24 hours after the procedure. °· If you did not receive a sedative, ask your health care provider when it is safe to drive. °Activity °· Return to your normal activities as told by your health care provider. Ask your health care provider what activities are safe for you. °· Until your health care provider says it is safe: °? Do not lift anything that is heavier than 10 lb (4.5 kg). °? Do not do activities that involve lifting your arms over your head. °General instructions °· Do not use any tobacco products, such as cigarettes, chewing tobacco, and e-cigarettes. Tobacco can delay healing. If you need help quitting, ask your health care provider. °· Keep all follow-up visits as told by your health care provider. This is important. °Contact a health care provider if: °· You have more redness, swelling, or pain around your incision. °· You have more fluid or blood coming from your incision. °· Your incision feels warm to the touch. °· You have pus or a bad smell coming from your incision. °· You have a fever. °· You have pain that is not relieved by your pain medicine. °Get help right away if: °· You have chest pain. °· You have difficulty breathing. °This information is not intended to replace advice given to you by your health care provider. Make sure you discuss any questions you have with your health care provider. °Document Released: 04/09/2015 Document Revised: 10/04/2015 Document Reviewed: 01/31/2015 °Elsevier Interactive Patient   Education © 2018 [employer] °Moderate Conscious Sedation, Adult, Care After °These instructions provide you with information about caring for yourself after your procedure. Your health care provider may also give you more specific instructions. Your treatment has been planned according to current medical practices, but problems sometimes occur. Call your health care provider if you have  any problems or questions after your procedure. °What can I expect after the procedure? °After your procedure, it is common: °· To feel sleepy for several hours. °· To feel clumsy and have poor balance for several hours. °· To have poor judgment for several hours. °· To vomit if you eat too soon. ° °Follow these instructions at home: °For at least 24 hours after the procedure: ° °· Do not: °? Participate in activities where you could fall or become injured. °? Drive. °? Use heavy machinery. °? Drink alcohol. °? Take sleeping pills or medicines that cause drowsiness. °? Make important decisions or sign legal documents. °? Take care of children on your own. °· Rest. °Eating and drinking °· Follow the diet recommended by your health care provider. °· If you vomit: °? Drink water, juice, or soup when you can drink without vomiting. °? Make sure you have little or no nausea before eating solid foods. °General instructions °· Have a responsible adult stay with you until you are awake and alert. °· Take over-the-counter and prescription medicines only as told by your health care provider. °· If you smoke, do not smoke without supervision. °· Keep all follow-up visits as told by your health care provider. This is important. °Contact a health care provider if: °· You keep feeling nauseous or you keep vomiting. °· You feel light-headed. °· You develop a rash. °· You have a fever. °Get help right away if: °· You have trouble breathing. °This information is not intended to replace advice given to you by your health care provider. Make sure you discuss any questions you have with your health care provider. °Document Released: 02/16/2013 Document Revised: 10/01/2015 Document Reviewed: 08/18/2015 °Elsevier Interactive Patient Education © 2018 [employer] ° °

## 2017-01-30 NOTE — Sedation Documentation (Signed)
Patient denies pain and is resting comfortably.  

## 2017-01-30 NOTE — H&P (Signed)
Chief Complaint: Patient was seen in consultation today for tonsil cancer  Referring Physician(s): Gorsuch,Ni  History of Present Illness: Todd [name] is a 56 y.o. 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 He underwent Port-A-Cath and gastrostomy tube placement 03/20/17 by Dr. Pascal Lux. Patient has completed all treatment and has been without evidence of disease recurrence of progression. His dysphagia has improved and he is eating and drinking well with stable weight.  He presents to radiology today for Port-A-Cath and gastrostomy tube removal at the request of Dr. Alvy Bimler.   He has been NPO.  He does not take blood thinners.  He denies complaints today and has been in his usual state of health at home.   Past Medical History:  Diagnosis Date  . Arthritis   . Cancer ([place])    Tonsil Cancer  . History of radiation therapy 03/24/2016- 05/15/2016   Left Tonsil and Bilateral Neck  . Hyperlipidemia   . Hypertension   . Hypothyroidism     Past Surgical History:  Procedure Laterality Date  . IR GENERIC HISTORICAL  03/20/2016   IR US GUIDE VASC ACCESS RIGHT 03/20/2016 Sandi Mariscal, MD [facility]  . IR GENERIC HISTORICAL  03/20/2016   IR FLUORO GUIDE PORT INSERTION RIGHT 03/20/2016 Sandi Mariscal, MD [facility]  . IR GENERIC HISTORICAL  03/20/2016   IR GASTROSTOMY TUBE MOD SED 03/20/2016 Sandi Mariscal, MD [facility]    Allergies: Phenergan [promethazine hcl]  Medications: Prior to Admission medications   Medication Sig Start Date End Date Taking? Authorizing Provider  fenofibrate (TRICOR) 145 MG tablet Take 145 mg by mouth daily.  12/03/15   [provider]  levothyroxine (SYNTHROID, LEVOTHROID) 50 MCG tablet Take 50 mcg by mouth daily before breakfast.  11/21/15   [provider]  Nutritional Supplements (FEEDING SUPPLEMENT, OSMOLITE 1.5 CAL,) LIQD Run Osmolite 1.5 at 100 ML/hr for 12  hours via PEG. Give one can BID daily with 120 cc free water before and after bolus feedings and continuous feeding. 06/16/16   Heath Lark, MD  sodium fluoride (FLUORISHIELD) 1.1 % GEL dental gel Instill one drop of gel per tooth space of fluoride tray. Place over teeth for 5 minutes. Remove. Spit out excess. Repeat nightly. 02/28/16   Lenn Cal, DDS     Family History  Problem Relation Age of Onset  . Cancer Maternal Grandmother        unknown ca  . Dementia Father   . Diabetes Father     Social History   Social History  . Marital status: Married    Spouse name: Jenny Reichmann  . Number of children: 3  . Years of education: N/A   Occupational History  . sales rep     [employer]   Social History Main Topics  . Smoking status: Former Research scientist (life sciences)  . Smokeless tobacco: Never Used     Comment: unlikely to have significant health impact - social smoker at age 37, then stopped.  None since.  . Alcohol use No  . Drug use: No  . Sexual activity: Not on file   Other Topics Concern  . Not on file   Social History Narrative   02/28/2016   Patient is divorced and remarried. Patient had 2 daughters with his first wife and one daughter passed away due to SIDS. Patient has 2 stepchildren from his recent marriage. There ages are 44 and 66.   Patient smokes  cigarettes from the ages of [age]. None since then. Patient never chewed tobacco. Patient with rare use of alcohol. Patient does not use other illicit drugs.       Review of Systems  Constitutional: Negative for fatigue and fever.  Respiratory: Negative for cough and shortness of breath.   Cardiovascular: Negative for chest pain.  Gastrointestinal: Negative for abdominal pain.  Psychiatric/Behavioral: Negative for behavioral problems and confusion.    Vital Signs: BP 125/81 (BP Location: Right Arm)   Pulse (!) 55   Temp 98.3 F (36.8 C) (Oral)   Resp 18   Ht 5\' 10"  (1.778 m)   Wt 169 lb 6.4 oz (76.8 kg)   SpO2 100%   BMI  24.31 kg/m   Physical Exam  Constitutional: He is oriented to person, place, and time. He appears well-developed.  Cardiovascular: Normal rate, regular rhythm and normal heart sounds.   Pulmonary/Chest: Effort normal and breath sounds normal. No respiratory distress.  Neurological: He is alert and oriented to person, place, and time.  Skin: Skin is warm and dry.  Psychiatric: He has a normal mood and affect. His behavior is normal. Judgment and thought content normal.  Nursing note and vitals reviewed.   Mallampati Score:  MD Evaluation Airway: WNL Heart: WNL Abdomen: WNL Chest/ Lungs: WNL ASA  Classification: 3 Mallampati/Airway Score: Two  Imaging: No results found.  Labs:  CBC:  Recent Labs  07/22/16 1047 08/05/16 0953 11/24/16 1108 01/30/17 0738  WBC 4.2 3.3* 4.2 2.6*  HGB 10.6* 10.9* 11.5* 12.5*  HCT 30.7* 31.6* 34.1* 36.7*  PLT 319 303 248 271    COAGS:  Recent Labs  03/20/16 1200 01/30/17 0738  INR 1.01 0.99  APTT  --  29    BMP:  Recent Labs  03/20/16 1200  05/11/16 1608  07/22/16 1047 08/05/16 0953 11/24/16 1108 01/30/17 0738  NA 139  < > 132*  < > 139 140 140 139  K 3.9  < > 3.3*  < > 4.2 4.2 4.3 3.9  CL 105  --  96*  --   --   --   --  103  CO2 25  < > 28  < > 29 31* 28 27  GLUCOSE 102*  < > 87  < > 148* 111 101 101*  BUN 19  < > 30*  < > 27.1* 22.4 24.1 26*  CALCIUM 9.7  < > 8.9  < > 10.2 10.3 9.9 9.7  CREATININE 1.09  < > 1.13  < > 1.3 1.3 1.3 1.32*  GFRNONAA >60  --  >60  --   --   --   --  59*  GFRAA >60  --  >60  --   --   --   --  >60  < > = values in this interval not displayed.  LIVER FUNCTION TESTS:  Recent Labs  06/10/16 1149 07/22/16 1047 08/05/16 0953 11/24/16 1108  BILITOT 0.32 0.30 0.30 0.52  AST 13 17 16 20   ALT 16 21 19 19   ALKPHOS 49 41 45 38*  PROT 6.8 6.9 7.1 6.9  ALBUMIN 3.6 4.0 4.2 4.1    TUMOR MARKERS: No results for input(s): AFPTM, CEA, CA199, CHROMGRNA in the last 8760 hours.  Assessment  and Plan: Patient with history of tonsil cancer s/p Port-A-Cath and gastrostomy tube placement for support during chemo and radiation treatment has now completed therapy. He has recovered well from the side effects of treatment and is no longer  in need of his Port or feeding tube.  He presents to radiology department today requesting these be removed.  He has been NPO. He does not take blood thinners.  Risks and benefits discussed with the patient including, but not limited to bleeding, infection, or need for additional procedures. All of the patient's questions were answered, patient is agreeable to proceed. Consent signed and in chart.   Thank you for this interesting consult.  I greatly enjoyed meeting Marke Goodwyn and look forward to participating in their care.  A copy of this report was sent to the requesting provider on this date.  Electronically Signed: Docia Barrier 01/30/2017, 9:16 AM   I spent a total of    15 Minutes in face to face in clinical consultation, greater than 50% of which was counseling/coordinating care for tonsil cancer.

## 2017-01-30 NOTE — Procedures (Signed)
Interventional Radiology Procedure Note  Procedure: Removal of right IJ port.  Removal of pull through Gtube.  Removed in entirety.  No foreign body retention.  Complications: None Recommendations:  - Ok to shower tomorrow - Do not submerge for 7 days - Routine wound care   Signed,  Dulcy Fanny. Earleen Newport, DO

## 2017-02-16 ENCOUNTER — Ambulatory Visit (HOSPITAL_BASED_OUTPATIENT_CLINIC_OR_DEPARTMENT_OTHER): Payer: BLUE CROSS/BLUE SHIELD | Admitting: Hematology and Oncology

## 2017-02-16 ENCOUNTER — Encounter: Payer: Self-pay | Admitting: Hematology and Oncology

## 2017-02-16 ENCOUNTER — Telehealth: Payer: Self-pay | Admitting: Hematology and Oncology

## 2017-02-16 ENCOUNTER — Other Ambulatory Visit (HOSPITAL_BASED_OUTPATIENT_CLINIC_OR_DEPARTMENT_OTHER): Payer: BLUE CROSS/BLUE SHIELD

## 2017-02-16 DIAGNOSIS — C099 Malignant neoplasm of tonsil, unspecified: Secondary | ICD-10-CM

## 2017-02-16 DIAGNOSIS — I890 Lymphedema, not elsewhere classified: Secondary | ICD-10-CM

## 2017-02-16 DIAGNOSIS — D61818 Other pancytopenia: Secondary | ICD-10-CM | POA: Diagnosis not present

## 2017-02-16 LAB — CBC WITH DIFFERENTIAL/PLATELET
BASO%: 0.9 % (ref 0.0–2.0)
Basophils Absolute: 0 10*3/uL (ref 0.0–0.1)
EOS%: 2.3 % (ref 0.0–7.0)
Eosinophils Absolute: 0.1 10*3/uL (ref 0.0–0.5)
HCT: 35.9 % — ABNORMAL LOW (ref 38.4–49.9)
HEMOGLOBIN: 12.2 g/dL — AB (ref 13.0–17.1)
LYMPH%: 13 % — ABNORMAL LOW (ref 14.0–49.0)
MCH: 30.3 pg (ref 27.2–33.4)
MCHC: 34 g/dL (ref 32.0–36.0)
MCV: 88.9 fL (ref 79.3–98.0)
MONO#: 0.4 10*3/uL (ref 0.1–0.9)
MONO%: 11.2 % (ref 0.0–14.0)
NEUT%: 72.6 % (ref 39.0–75.0)
NEUTROS ABS: 2.5 10*3/uL (ref 1.5–6.5)
Platelets: 273 10*3/uL (ref 140–400)
RBC: 4.04 10*6/uL — AB (ref 4.20–5.82)
RDW: 12.9 % (ref 11.0–14.6)
WBC: 3.4 10*3/uL — AB (ref 4.0–10.3)
lymph#: 0.4 10*3/uL — ABNORMAL LOW (ref 0.9–3.3)

## 2017-02-16 LAB — COMPREHENSIVE METABOLIC PANEL
ALBUMIN: 4.1 g/dL (ref 3.5–5.0)
ALK PHOS: 32 U/L — AB (ref 40–150)
ALT: 15 U/L (ref 0–55)
ANION GAP: 7 meq/L (ref 3–11)
AST: 19 U/L (ref 5–34)
BILIRUBIN TOTAL: 0.41 mg/dL (ref 0.20–1.20)
BUN: 19.8 mg/dL (ref 7.0–26.0)
CALCIUM: 10.1 mg/dL (ref 8.4–10.4)
CHLORIDE: 104 meq/L (ref 98–109)
CO2: 29 mEq/L (ref 22–29)
CREATININE: 1.3 mg/dL (ref 0.7–1.3)
EGFR: 59 mL/min/{1.73_m2} — ABNORMAL LOW (ref >90)
Glucose: 94 mg/dl (ref 70–140)
Potassium: 4.1 mEq/L (ref 3.5–5.1)
Sodium: 140 mEq/L (ref 136–145)
TOTAL PROTEIN: 6.8 g/dL (ref 6.4–8.3)

## 2017-02-16 NOTE — Telephone Encounter (Signed)
Gave avs and calendar for April 2019 °

## 2017-02-16 NOTE — Assessment & Plan Note (Signed)
The patient had persistent pancytopenia It is stable and anemia is improving For now, he is not symptomatic and does not require blood transfusion for anemia or antibiotics for mild leukopenia Overall, they are improving I recommend close monitoring with blood work with his primary care doctor and plan to recheck it again next year

## 2017-02-16 NOTE — Progress Notes (Signed)
[place] OFFICE PROGRESS NOTE  Patient Care Team: Tisovec, Fransico Him, MD as PCP - General (Internal Medicine) Leota Sauers, RN as Oncology Nurse Navigator Eppie Gibson, MD as Attending Physician (Radiation Oncology) Heath Lark, MD as Consulting Physician (Hematology and Oncology) Karie Mainland, RD as Dietitian (Nutrition)  SUMMARY OF ONCOLOGIC HISTORY:   Tonsil cancer ([place])   02/07/2016 Imaging    Ct neck showed abnormal soft tissue within the left pharynx, extending from the left tonsillar pillar inferiorly to the level of the hyoid and filling the left vallecula. The appearance is most consistent with a neoplastic process. 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 Histologic sampling and direct visualization should be considered. Confluent adenopathy within the left cervical chain with areas of central hypoattenuation suggesting degrees of necrosis. This is most suggestive of metastatic spread. The areas of necrosis within the adenopathy combined with the above-described pharyngeal soft tissue mass are together suggestive of squamous cell carcinoma.      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 Borderline hypermetabolic small right station IIb lymph node. Several tiny lung nodules are observed, these are not hypermetabolic but are also below the threshold level for accurate characterization. Dedicated CT chest may be warranted to act as a baseline.      03/20/2016 Procedure    Placement of a right internal jugular approach power injectable Port-A-Cath. Fluoroscopic insertion of a 20-French pull-through gastrostomy tube.        03/24/2016 - 05/06/2016 Chemotherapy    He received high dose cisplatin; dose 2 and 3 with dose reduction due to side-effects      03/24/2016 - 05/15/2016 Radiation Therapy    Site/dose:  Left Tonsil and Bilateral Neck / 70 Gy in 35 fractions to gross disease, 63 Gy in 35 fractions to high risk nodal echelons, and 56 Gy in 35 fractions to intermediate risk nodal echelons. Beams/energy:   Helical IMRT / 6 MV photons      09/04/2016 Imaging    1. Significant response to therapy of left oropharyngeal/palatine tonsil primary. Low-level residual hypermetabolism could be treatment related or represent residual disease. 2. Resolution of cervical adenopathy. Low-level hypermetabolism within only 1 level 2 node remains. 3. No evidence of hypermetabolic extra cervical disease. 4. Age advanced coronary artery atherosclerosis. Recommend assessment of coronary risk factors and consideration of medical therapy. 5. Progressive sinus disease. 6. Bilateral pulmonary nodules. A right lower lobe nodule may be new. Below PET resolution. Recommend attention on follow-up.       11/24/2016 Imaging    1. Post radiation changes in the neck. Left tonsillar mass and malignant lymphadenopathy appear resolved by CT, with residual 7-8 mm short axis bilateral level 2 lymph nodes, rim calcified on the left. 2. Interval bilateral maxilla wisdom tooth extractions with new inflammation in the right maxillary sinus which appears related to the tooth extraction site. Recommend ENT consultation. 3. Chest CT today is reported separately.      11/24/2016 Imaging    1. 3 mm posterior right lower lobe pulmonary nodule seen on the previous PET-CT has resolved completely in the interval. With 3 mm nodule in  the posterior left lower lobe is unchanged. Continued attention on follow-up recommended. 2. No new or progressive findings on today's exam      01/30/2017 Procedure    Removal of percutaneous gastrostomy tube and port-a-cath.        INTERVAL HISTORY: Please see below for problem oriented charting. He returns for further follow-up He is doing very well Denies recent dental issue He denies recent dysphagia He has minimum lymphedema around his neck, stable and improving The patient continues to tfollow his primary care doctor for refills of thyroid medications He had pre-existing hypothyroidism even before the start of treatment He had recent ENT visit  REVIEW OF SYSTEMS:   Constitutional: Denies fevers, chills or abnormal weight loss Eyes: Denies blurriness of vision Ears, nose, mouth, throat, and face: Denies mucositis or sore throat Respiratory: Denies cough, dyspnea or wheezes Cardiovascular: Denies palpitation, chest discomfort or lower extremity swelling Gastrointestinal:  Denies nausea, heartburn or change in bowel habits Skin: Denies abnormal skin rashes Lymphatics: Denies new lymphadenopathy or easy bruising Neurological:Denies numbness, tingling or new weaknesses Behavioral/Psych: Mood is stable, no new changes  All other systems were reviewed with the patient and are negative.  I have reviewed the past medical history, past surgical history, social history and family history with the patient and they are unchanged from previous note.  ALLERGIES:  is allergic to phenergan [promethazine hcl].  MEDICATIONS:  Current Outpatient Prescriptions  Medication Sig Dispense Refill  . fenofibrate (TRICOR) 145 MG tablet Take 145 mg by mouth daily.     [name] levothyroxine (SYNTHROID, LEVOTHROID) 50 MCG tablet Take 50 mcg by mouth daily before breakfast.     . Nutritional Supplements (FEEDING SUPPLEMENT, OSMOLITE 1.5 CAL,) LIQD Run Osmolite 1.5 at 100 ML/hr for 12 hours via PEG. Give one can BID daily with 120 cc free water before and after bolus feedings and continuous feeding. 1659 mL 0  . sodium fluoride (FLUORISHIELD) 1.1 % GEL dental gel Instill one drop of gel per tooth space of fluoride tray. Place over teeth  for 5 minutes. Remove. Spit out excess. Repeat nightly. 120 mL prn   No current facility-administered medications for this visit.     PHYSICAL EXAMINATION: ECOG PERFORMANCE STATUS: 0 - Asymptomatic  Vitals:   02/16/17 0922  BP: 124/76  Pulse: (!) 55  Resp: 18  Temp: 98.3 F (36.8 C)  SpO2: 100%   Filed Weights   02/16/17 0922  Weight: 171 lb 11.2 oz (77.9 kg)    GENERAL:alert, no distress and comfortable SKIN: skin color, texture, turgor are normal, no rashes or significant lesions EYES: normal, Conjunctiva are pink and non-injected, sclera clear OROPHARYNX:no exudate, no erythema and lips, buccal mucosa, and tongue normal  NECK: Noticed subtle lymphedema and mild firmness from radiation induced fibrosis LYMPH:  no palpable lymphadenopathy in the cervical, axillary or inguinal LUNGS: clear to auscultation and percussion with normal breathing effort HEART: regular rate & rhythm and no murmurs and no lower extremity edema ABDOMEN:abdomen soft, non-tender and normal bowel sounds Musculoskeletal:no cyanosis of digits and no clubbing  NEURO: alert & oriented x 3 with fluent speech, no focal motor/sensory deficits  LABORATORY DATA:  I have reviewed the data as listed    Component Value Date/Time   NA 140 02/16/2017 0841   K 4.1 02/16/2017 0841   CL 103 01/30/2017 0738   CO2 29 02/16/2017 0841   GLUCOSE 94 02/16/2017 0841   BUN 19.8 02/16/2017 0841   CREATININE 1.3 02/16/2017 0841  CALCIUM 10.1 02/16/2017 0841   PROT 6.8 02/16/2017 0841   ALBUMIN 4.1 02/16/2017 0841   AST 19 02/16/2017 0841   ALT 15 02/16/2017 0841   ALKPHOS 32 (L) 02/16/2017 0841   BILITOT 0.41 02/16/2017 0841   GFRNONAA 59 (L) 01/30/2017 0738   GFRAA >60 01/30/2017 0738    No results found for: SPEP, UPEP  Lab Results  Component Value Date   WBC 3.4 (L) 02/16/2017   NEUTROABS 2.5 02/16/2017   HGB 12.2 (L) 02/16/2017   HCT 35.9 (L) 02/16/2017   MCV 88.9 02/16/2017   PLT 273 02/16/2017       Chemistry      Component Value Date/Time   NA 140 02/16/2017 0841   K 4.1 02/16/2017 0841   CL 103 01/30/2017 0738   CO2 29 02/16/2017 0841   BUN 19.8 02/16/2017 0841   CREATININE 1.3 02/16/2017 0841      Component Value Date/Time   CALCIUM 10.1 02/16/2017 0841   ALKPHOS 32 (L) 02/16/2017 0841   AST 19 02/16/2017 0841   ALT 15 02/16/2017 0841   BILITOT 0.41 02/16/2017 0841       RADIOGRAPHIC STUDIES: I have personally reviewed the radiological images as listed and agreed with the findings in the report. Ir Removal [employer] W/o Fl  Result Date: 01/30/2017 INDICATION: 56 year old male with a history of head neck cancer, completed treatment. He presents today for port catheter removal and gastrostomy removal. EXAM: REMOVAL RIGHT IJ VEIN PORT-A-CATH BEDSIDE REMOVAL PERCUTANEOUS GASTROSTOMY MEDICATIONS: 2.0 g Ancef; The antibiotic was administered within an appropriate time interval prior to skin puncture. ANESTHESIA/SEDATION: Moderate (conscious) sedation was employed during this procedure. A total of Versed 2.0 mg and Fentanyl 100 mcg was administered intravenously. Moderate Sedation Time: 16 minutes. The patient's level of consciousness and vital signs were monitored continuously by radiology nursing throughout the procedure under my direct supervision. FLUOROSCOPY TIME:  None COMPLICATIONS: None immediate. PROCEDURE: Informed consent was obtained from the patient following an explanation of the procedure, risks, benefits and alternatives. The patient understands, agrees and consents for the procedure. All questions were addressed. A time out was performed prior to the initiation of the procedure. The patient was positioned in the operation suite in the supine position on a gantry. The previous scar on the right chest was generously infiltrated with 1% lidocaine for local anesthesia. Infiltration of the skin and subcutaneous tissues surrounding the port was performed. Using  sharp and blunt dissection, the port apparatus and subcutaneous catheter were removed in their entirety. The port pocket was then closed with interrupted Vicryl layer and a running subcuticular with 4-0 Monocryl. The skin was sealed with Derma bond. A sterile dressing was placed. Manual traction was then used to remove the percutaneous gastrostomy tube. Tube was removed in its entirety. The patient tolerated the procedure well and remained hemodynamically stable throughout. No complications were encountered and no significant blood loss was encountered. IMPRESSION: Status post bedside port catheter removal. Status post bedside removal of gastrostomy tube. Signed, Dulcy Fanny. Earleen Newport, DO [facility] Radiology Electronically Signed   By: Corrie Mckusick D.O.   On: 01/30/2017 11:04   Ir Gastrostomy Tube Removal  Result Date: 01/30/2017 INDICATION: 56 year old male with a history of head neck cancer, completed treatment. He presents today for port catheter removal and gastrostomy removal. EXAM: REMOVAL RIGHT IJ VEIN PORT-A-CATH BEDSIDE REMOVAL PERCUTANEOUS GASTROSTOMY MEDICATIONS: 2.0 g Ancef; The antibiotic was administered within an appropriate time interval  prior to skin puncture. ANESTHESIA/SEDATION: Moderate (conscious) sedation was employed during this procedure. A total of Versed 2.0 mg and Fentanyl 100 mcg was administered intravenously. Moderate Sedation Time: 16 minutes. The patient's level of consciousness and vital signs were monitored continuously by radiology nursing throughout the procedure under my direct supervision. FLUOROSCOPY TIME:  None COMPLICATIONS: None immediate. PROCEDURE: Informed consent was obtained from the patient following an explanation of the procedure, risks, benefits and alternatives. The patient understands, agrees and consents for the procedure. All questions were addressed. A time out was performed prior to the initiation of the  procedure. The patient was positioned in the operation suite in the supine position on a gantry. The previous scar on the right chest was generously infiltrated with 1% lidocaine for local anesthesia. Infiltration of the skin and subcutaneous tissues surrounding the port was performed. Using sharp and blunt dissection, the port apparatus and subcutaneous catheter were removed in their entirety. The port pocket was then closed with interrupted Vicryl layer and a running subcuticular with 4-0 Monocryl. The skin was sealed with Derma bond. A sterile dressing was placed. Manual traction was then used to remove the percutaneous gastrostomy tube. Tube was removed in its entirety. The patient tolerated the procedure well and remained hemodynamically stable throughout. No complications were encountered and no significant blood loss was encountered. IMPRESSION: Status post bedside port catheter removal. Status post bedside removal of gastrostomy tube. Signed, Dulcy Fanny. Earleen Newport, DO [facility] Radiology Electronically Signed   By: Corrie Mckusick D.O.   On: 01/30/2017 11:04    ASSESSMENT & PLAN:  Tonsil cancer ([place]) He has almost completely recovered from all side effects of treatment. CT scan in July showed complete resolution of disease I plan to see him back in 6 months.  Clinically, he has no signs of disease recurrence Based on subtle abnormalities on recent imaging, I plan to repeat imaging study next year, due around July 2019  Pancytopenia, acquired [facility]) The patient had persistent pancytopenia It is stable and anemia is improving For now, he is not symptomatic and does not require blood transfusion for anemia or antibiotics for mild leukopenia Overall, they are improving I recommend close monitoring with blood work with his primary care doctor and plan to recheck it again next year  Acquired lymphedema He has mild acquired lymphedema around his neck I  reinforced importance of regular exercise   No orders of the defined types were placed in this encounter.  All questions were answered. The patient knows to call the clinic with any problems, questions or concerns. No barriers to learning was detected. I spent 15 minutes counseling the patient face to face. The total time spent in the appointment was 20 minutes and more than 50% was on counseling and review of test results     Heath Lark, MD 02/16/2017 10:50 AM

## 2017-02-16 NOTE — Assessment & Plan Note (Signed)
He has mild acquired lymphedema around his neck I reinforced importance of regular exercise

## 2017-02-16 NOTE — Assessment & Plan Note (Signed)
He has almost completely recovered from all side effects of treatment. CT scan in July showed complete resolution of disease I plan to see him back in 6 months.  Clinically, he has no signs of disease recurrence Based on subtle abnormalities on recent imaging, I plan to repeat imaging study next year, due around July 2019

## 2017-02-18 ENCOUNTER — Encounter: Payer: Self-pay | Admitting: *Deleted

## 2017-02-18 NOTE — Progress Notes (Signed)
Oncology Nurse Navigator Documentation  Todd [name]'s wife attended the Fall 2018 [facility] survivorship series, 5 consecutive Tuesday evenings 6:00-7:15 pm beginning 01/20/17.  He was unable to attend d/t job.  Gayleen Orem, RN, BSN, [place] Oncology Nurse [place] [facility] [place] [phone]

## 2017-02-21 ENCOUNTER — Encounter (HOSPITAL_COMMUNITY): Payer: Self-pay | Admitting: Emergency Medicine

## 2017-02-21 ENCOUNTER — Ambulatory Visit (HOSPITAL_COMMUNITY)
Admission: EM | Admit: 2017-02-21 | Discharge: 2017-02-21 | Disposition: A | Discharge status: Home / Self Care | Payer: BLUE CROSS/BLUE SHIELD | Attending: Internal Medicine | Admitting: Internal Medicine

## 2017-02-21 DIAGNOSIS — J0100 Acute maxillary sinusitis, unspecified: Secondary | ICD-10-CM

## 2017-02-21 MED ORDER — AZITHROMYCIN 250 MG PO TABS: 250.0000 mg | ORAL_TABLET | Freq: Every day | ORAL | 0 refills | Status: DC

## 2017-02-21 NOTE — ED Triage Notes (Signed)
Pt here for cold sx onset 6 days associated w/swelling of left side of jaw, prod cough, facial pressure  Denies fevers, chills  Taking OTC cold meds w/temp relief.   Hx of tonsillar cancer  A&O x4... NAD... Ambulatory

## 2017-02-21 NOTE — Discharge Instructions (Signed)
Recommend start Zithromax as directed. Continue Sudafed as needed. May take Delsym at night as needed for cough. May continue Tylenol as needed for pain. Follow-up with your PCP in 2 to 3 days if not improving.

## 2017-02-21 NOTE — ED Provider Notes (Signed)
[facility]    CSN: [id number] Arrival date & time: 02/21/17  1543     History   Chief Complaint Chief Complaint  Patient presents with  . URI    HPI Todd [name] is a 56 y.o. male.   56 year old male presents with nasal congestion, sinus pressure, productive cough for the past 5 to 6 days. Also having some left sided jaw pain and fatigue but no fever, chills, or GI symptoms. Has taken Sudafed and Tylenol with some relief. Concerned about left sided jaw pain and slight swelling this morning since he has a history of tonsillar cancer that has been treated with radiation and chemotherapy in late 2017 and early 2018. Swelling has since gone down after taking 3 doses of Sudafed. Other chronic health issues include thyroid and cholesterol disorder and takes maintenance medications daily. He also indicated that Amoxicillin and Augmentin have never successfully treated any infections he has had in the past and Zithromax seems to "always work".    The history is provided by the patient and the spouse.    Past Medical History:  Diagnosis Date  . Arthritis   . Cancer ([place])    Tonsil Cancer  . History of radiation therapy 03/24/2016- 05/15/2016   Left Tonsil and Bilateral Neck  . Hyperlipidemia   . Hypertension   . Hypothyroidism     Patient Active Problem List   Diagnosis Date Noted  . Acquired lymphedema 11/26/2016  . Multiple lung nodules on CT 09/08/2016  . Pancytopenia, acquired ([place]) 05/26/2016  . Deficiency anemia 05/26/2016  . Splenic lesion 05/16/2016  . Anemia due to antineoplastic chemotherapy 04/14/2016  . Jaw pain 04/08/2016  . Bilateral tinnitus 03/31/2016  . Essential hypertension 03/18/2016  . Carcinoma of tonsillar fossa ([place]) 02/26/2016  . Tonsil cancer ([place]) 02/21/2016    Past Surgical History:  Procedure Laterality Date  . IR GASTROSTOMY TUBE REMOVAL  01/30/2017  . IR GENERIC HISTORICAL  03/20/2016   IR US GUIDE VASC ACCESS RIGHT 03/20/2016 Sandi Mariscal, MD [facility]-INTERV RAD  . IR GENERIC HISTORICAL  03/20/2016   IR FLUORO GUIDE PORT INSERTION RIGHT 03/20/2016 Sandi Mariscal, MD [facility]  . IR GENERIC HISTORICAL  03/20/2016   IR GASTROSTOMY TUBE MOD SED 03/20/2016 Sandi Mariscal, MD [facility]INTER[facility]  . IR REMOVAL TUN ACCESS W/ PORT W/O FL MOD SED  01/30/2017       Home Medications    Prior to Admission medications   Medication Sig Start Date End Date Taking? Authorizing Provider  fenofibrate (TRICOR) 145 MG tablet Take 145 mg by mouth daily.  12/03/15  Yes [provider]  levothyroxine (SYNTHROID, LEVOTHROID) 50 MCG tablet Take 50 mcg by mouth daily before breakfast.  11/21/15  Yes [provider]  azithromycin (ZITHROMAX) 250 MG tablet Take 1 tablet (250 mg total) by mouth daily. Take first 2 tablets together, then 1 every day until finished. 02/21/17   Katy Apo, NP  sodium fluoride (FLUORISHIELD) 1.1 % GEL dental gel Instill one drop of gel per tooth space of fluoride tray. Place over teeth for 5 minutes. Remove. Spit out excess. Repeat nightly. 02/28/16   Lenn Cal, DDS    Family History Family History  Problem Relation Age of Onset  . Cancer Maternal Grandmother        unknown ca  . Dementia Father   . Diabetes Father     Social History Social History  Substance Use Topics  . Smoking status: Former Research scientist (life sciences)  .  Smokeless tobacco: Never Used     Comment: unlikely to have significant health impact - social smoker at age 62, then stopped.  None since.  . Alcohol use No     Allergies   Phenergan [promethazine hcl]   Review of Systems Review of Systems  Constitutional: Positive for fatigue. Negative for activity change, appetite change, chills and fever.  HENT: Positive for congestion, facial swelling, postnasal drip, sinus pain, sinus pressure and sore throat. Negative for ear discharge, ear pain, mouth sores, nosebleeds and sneezing.   Eyes: Negative for pain, discharge, redness and visual  disturbance.  Respiratory: Positive for cough. Negative for chest tightness, shortness of breath and wheezing.   Gastrointestinal: Negative for diarrhea, nausea and vomiting.  Musculoskeletal: Negative for back pain, neck pain and neck stiffness.  Skin: Negative for rash and wound.  Allergic/Immunologic: Positive for immunocompromised state.  Neurological: Positive for headaches. Negative for dizziness, tremors, seizures, syncope, weakness, light-headedness and numbness.  Psychiatric/Behavioral: Negative.      Physical Exam Triage Vital Signs ED Triage Vitals  Enc Vitals Group     BP 02/21/17 1546 134/67     Pulse Rate 02/21/17 1546 62     Resp 02/21/17 1546 20     Temp 02/21/17 1546 97.6 F (36.4 C)     Temp Source 02/21/17 1546 Oral     SpO2 02/21/17 1546 100 %     Weight --      Height --      Head Circumference --      Peak Flow --      Pain Score 02/21/17 1548 1     Pain Loc --      Pain Edu? --      Excl. in [place]? --    No data found.   Updated Vital Signs BP 134/67 (BP Location: Left Arm)   Pulse 62   Temp 97.6 F (36.4 C) (Oral)   Resp 20   SpO2 100%   Visual Acuity Right Eye Distance:   Left Eye Distance:   Bilateral Distance:    Right Eye Near:   Left Eye Near:    Bilateral Near:     Physical Exam  Constitutional: He is oriented to person, place, and time. He appears well-developed and well-nourished. No distress.  HENT:  Head: Normocephalic and atraumatic.  Right Ear: Hearing, tympanic membrane, external ear and ear canal normal.  Left Ear: Hearing, tympanic membrane, external ear and ear canal normal.  Nose: Mucosal edema and rhinorrhea present. Right sinus exhibits maxillary sinus tenderness. Right sinus exhibits no frontal sinus tenderness. Left sinus exhibits maxillary sinus tenderness. Left sinus exhibits no frontal sinus tenderness.  Mouth/Throat: Uvula is midline and mucous membranes are normal. Posterior oropharyngeal erythema present.    Eyes: Conjunctivae and EOM are normal.  Neck: Normal range of motion. Neck supple.  No distinct swelling along left side of jaw/neck. No erythema. Slightly tender.    Cardiovascular: Normal rate, regular rhythm and normal heart sounds.   No murmur heard. Pulmonary/Chest: Effort normal and breath sounds normal. No respiratory distress. He has no decreased breath sounds. He has no wheezes. He has no rhonchi.  Musculoskeletal: Normal range of motion.  Lymphadenopathy:    He has no cervical adenopathy.  Neurological: He is alert and oriented to person, place, and time.  Skin: Skin is warm and dry. Capillary refill takes less than 2 seconds. No rash noted. No erythema.  Psychiatric: He has a normal mood and affect. His behavior  is normal. Judgment and thought content normal.     UC Treatments / Results  Labs (all labs ordered are listed, but only abnormal results are displayed) Labs Reviewed - No data to display  EKG  EKG Interpretation None       Radiology No results found.  Procedures Procedures (including critical care time)  Medications Ordered in UC Medications - No data to display   Initial Impression / Assessment and Plan / UC Course  I have reviewed the triage vital signs and the nursing notes.  Pertinent labs & imaging results that were available during my care of the patient were reviewed by me and considered in my medical decision making (see chart for details).    Discussed that he probably has a sinus infection which may cause some swelling into the left side of his face. No swelling currently. Recommend start Zithromax as directed. Reviewed that Zithromax is not often successful at resolving sinus infections but will trial this medication. May continue Sudafed as needed for congestion and sinus pressure. May take OTC Delsym at night as needed for cough. May continue Tylenol as needed for pain. Follow-up with your PCP in 2 to 3 days if not improving.    Final  Clinical Impressions(s) / UC Diagnoses   Final diagnoses:  Acute non-recurrent maxillary sinusitis    New Prescriptions Discharge Medication List as of 02/21/2017  5:09 PM    START taking these medications   Details  azithromycin (ZITHROMAX) 250 MG tablet Take 1 tablet (250 mg total) by mouth daily. Take first 2 tablets together, then 1 every day until finished., Starting Sat 02/21/2017, Normal         Controlled Substance Prescriptions [place] Controlled Substance Registry consulted? Not Applicable   Katy Apo, NP 02/22/17 1541

## 2017-02-23 ENCOUNTER — Telehealth: Payer: Self-pay | Admitting: *Deleted

## 2017-02-23 NOTE — Telephone Encounter (Signed)
Called patient in response to My Chart question. Dr Alvy Bimler would like him to follow up with Dr Redmond Baseman. Damico states he went to ED and was started on antibiotics which have helped. Has hearing test on Monday at Dr Redmond Baseman office and will request an appt.

## 2017-03-02 DIAGNOSIS — H903 Sensorineural hearing loss, bilateral: Secondary | ICD-10-CM | POA: Diagnosis not present

## 2017-03-02 DIAGNOSIS — C109 Malignant neoplasm of oropharynx, unspecified: Secondary | ICD-10-CM | POA: Diagnosis not present

## 2017-03-02 DIAGNOSIS — J019 Acute sinusitis, unspecified: Secondary | ICD-10-CM | POA: Diagnosis not present

## 2017-04-17 DIAGNOSIS — I890 Lymphedema, not elsewhere classified: Secondary | ICD-10-CM | POA: Diagnosis not present

## 2017-04-17 DIAGNOSIS — I10 Essential (primary) hypertension: Secondary | ICD-10-CM | POA: Diagnosis not present

## 2017-04-17 DIAGNOSIS — E038 Other specified hypothyroidism: Secondary | ICD-10-CM | POA: Diagnosis not present

## 2017-04-17 DIAGNOSIS — E7800 Pure hypercholesterolemia, unspecified: Secondary | ICD-10-CM | POA: Diagnosis not present

## 2017-04-17 DIAGNOSIS — R7301 Impaired fasting glucose: Secondary | ICD-10-CM | POA: Diagnosis not present

## 2017-04-17 DIAGNOSIS — D61818 Other pancytopenia: Secondary | ICD-10-CM | POA: Diagnosis not present

## 2017-04-17 DIAGNOSIS — C099 Malignant neoplasm of tonsil, unspecified: Secondary | ICD-10-CM | POA: Diagnosis not present

## 2017-06-22 DIAGNOSIS — C109 Malignant neoplasm of oropharynx, unspecified: Secondary | ICD-10-CM | POA: Diagnosis not present

## 2017-08-24 ENCOUNTER — Encounter: Payer: Self-pay | Admitting: Hematology and Oncology

## 2017-08-24 ENCOUNTER — Telehealth: Payer: Self-pay | Admitting: Hematology and Oncology

## 2017-08-24 ENCOUNTER — Inpatient Hospital Stay: Payer: BLUE CROSS/BLUE SHIELD | Attending: Hematology and Oncology | Admitting: Hematology and Oncology

## 2017-08-24 VITALS — BP 130/80 | HR 78 | Temp 98.2°F | Resp 18 | Ht 70.0 in | Wt 184.0 lb

## 2017-08-24 DIAGNOSIS — I10 Essential (primary) hypertension: Secondary | ICD-10-CM

## 2017-08-24 DIAGNOSIS — D61818 Other pancytopenia: Secondary | ICD-10-CM

## 2017-08-24 DIAGNOSIS — R918 Other nonspecific abnormal finding of lung field: Secondary | ICD-10-CM | POA: Diagnosis not present

## 2017-08-24 DIAGNOSIS — C099 Malignant neoplasm of tonsil, unspecified: Secondary | ICD-10-CM | POA: Diagnosis not present

## 2017-08-24 DIAGNOSIS — C090 Malignant neoplasm of tonsillar fossa: Secondary | ICD-10-CM

## 2017-08-24 MED ORDER — AMLODIPINE BESYLATE 10 MG PO TABS: 10.0000 mg | ORAL_TABLET | Freq: Every day | ORAL | 1 refills | Status: DC

## 2017-08-24 NOTE — Assessment & Plan Note (Signed)
He has recurrence of hypertension lately although his blood pressure today is normal He is requesting medication refill of amlodipine Her current recommend close follow-up with primary care physician for chronic blood pressure management in the future

## 2017-08-24 NOTE — Assessment & Plan Note (Signed)
Last CBC was abnormal I plan to recheck blood work again in his next visit I will not check his TSH as his hypothyroidism is managed through his primary care physician's office

## 2017-08-24 NOTE — Telephone Encounter (Signed)
Gave avs and calendar ° °

## 2017-08-24 NOTE — Assessment & Plan Note (Signed)
He has almost completely recovered from all side effects of treatment. Clinically, he has no signs of cancer recurrence. However, he had mild subtle abnormalities on last imaging, I plan to repeat imaging study around July 2019 He will continue close follow-up with ENT physician.

## 2017-08-24 NOTE — Progress Notes (Signed)
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 Clinically, he has no signs of cancer recurrence. However, he had mild subtle abnormalities on last imaging, I plan to repeat imaging study around July 2019 He will continue close follow-up with ENT physician.  Essential hypertension He has recurrence of hypertension lately although his blood pressure today is normal He is requesting medication refill of amlodipine Her current recommend close follow-up with primary care physician for chronic blood pressure management in the future  Pancytopenia, acquired ([place]) Last CBC was abnormal I plan to recheck blood work again in his next visit I will not check his TSH as his hypothyroidism is managed through his primary care physician's office   Orders Placed This Encounter  Procedures  . CT CHEST W CONTRAST    Standing Status:   Future    Number of Occurrences:   1    Standing Expiration Date:   08/25/2018    Order Specific Question:   If indicated for the ordered procedure, I authorize the administration of contrast media per Radiology protocol    Answer:   Yes    Order Specific Question:   Preferred imaging location?    Answer:   [facility]    Order Specific Question:   Radiology Contrast Protocol - do NOT remove file path    Answer:   \\[email or web address]  . CT Soft Tissue Neck W Contrast    Standing Status:   Future    Number of Occurrences:   1    Standing Expiration Date:   08/24/2018    Order Specific Question:   If indicated for the ordered  procedure, I authorize the administration of contrast media per Radiology protocol    Answer:   Yes    Order Specific Question:   Preferred imaging location?    Answer:   [facility]    Order Specific Question:   Radiology Contrast Protocol - do NOT remove file path    Answer:   \\[email or web address]  . CT CHEST W CONTRAST    Standing Status:   Future    Standing Expiration Date:   09/28/2018    Order Specific Question:   If indicated for the ordered procedure, I authorize the administration of contrast media per Radiology protocol    Answer:   Yes    Order Specific Question:   Preferred imaging location?    Answer:   [facility]    Order Specific Question:   Radiology Contrast Protocol - do NOT remove file path    Answer:   \\[email or web address]  . CT Soft Tissue Neck W Contrast    Standing Status:   Future    Standing Expiration Date:   09/28/2018    Order Specific Question:   If indicated for the ordered procedure, I authorize the administration of contrast media per Radiology protocol    Answer:   Yes    Order Specific Question:   Preferred imaging location?    Answer:   [facility]    Order Specific Question:   Radiology Contrast Protocol - do NOT remove file path  Answer:   \\[email or web address]  . Comprehensive metabolic panel    Standing Status:   Future    Number of Occurrences:   1    Standing Expiration Date:   09/28/2018  . CBC with Differential/Platelet    Standing Status:   Future    Number of Occurrences:   1    Standing Expiration Date:   09/28/2018  . Comprehensive metabolic panel    Standing Status:   Future    Standing Expiration Date:   09/28/2018  . CBC with Differential/Platelet    Standing Status:   Future    Standing Expiration Date:   09/28/2018    INTERVAL HISTORY: Please see below for problem oriented charting. He returns for further follow-up He denies new  abnormalities around his neck or inside his mouth No recent dental issue Denies dysphagia He has resumed taking blood pressure medications lately due to dizziness His diastolic blood pressure was noted to be high recently He denies recent tobacco use or alcohol intake  SUMMARY OF ONCOLOGIC HISTORY:   Tonsil cancer ([place])   02/07/2016 Imaging    Ct neck showed abnormal soft tissue within the left pharynx, extending from the left tonsillar pillar inferiorly to the level of the hyoid and filling the left vallecula. The appearance is most consistent with a neoplastic process. 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 Histologic sampling and direct visualization should be considered. Confluent adenopathy within the left cervical chain with areas of central hypoattenuation suggesting degrees of necrosis. This is most suggestive of metastatic spread. The areas of necrosis within the adenopathy combined with the above-described pharyngeal soft tissue mass are together suggestive of squamous cell carcinoma.      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 Borderline hypermetabolic small right station IIb lymph node. Several tiny lung nodules are observed, these are not hypermetabolic but are also below the threshold level for accurate characterization. Dedicated CT chest may be warranted to act as a baseline.      03/20/2016 Procedure    Placement of a right internal jugular approach power injectable Port-A-Cath. Fluoroscopic insertion of a 20-French pull-through gastrostomy tube.       03/24/2016 - 05/06/2016 Chemotherapy    He received high dose cisplatin; dose 2  and 3 with dose reduction due to side-effects      03/24/2016 - 05/15/2016 Radiation Therapy    Site/dose:  Left Tonsil and Bilateral Neck / 70 Gy in 35 fractions to gross disease, 63 Gy in 35 fractions to high risk nodal echelons, and 56 Gy in 35 fractions to intermediate risk nodal echelons. Beams/energy:   Helical IMRT / 6 MV photons      09/04/2016 Imaging    1. Significant response to therapy of left oropharyngeal/palatine tonsil primary. Low-level residual hypermetabolism could be treatment related or represent residual disease. 2. Resolution of cervical adenopathy. Low-level hypermetabolism within only 1 level 2 node remains. 3. No evidence of hypermetabolic extra cervical disease. 4. Age advanced coronary artery atherosclerosis. Recommend assessment of coronary risk factors and consideration of medical therapy. 5. Progressive sinus disease. 6. Bilateral pulmonary nodules. A right lower lobe nodule may  be new. Below PET resolution. Recommend attention on follow-up.       11/24/2016 Imaging    1. Post radiation changes in the neck. Left tonsillar mass and malignant lymphadenopathy appear resolved by CT, with residual 7-8 mm short axis bilateral level 2 lymph nodes, rim calcified on the left. 2. Interval bilateral maxilla wisdom tooth extractions with new inflammation in the right maxillary sinus which appears related to the tooth extraction site. Recommend ENT consultation. 3. Chest CT today is reported separately.      11/24/2016 Imaging    1. 3 mm posterior right lower lobe pulmonary nodule seen on the previous PET-CT has resolved completely in the interval. With 3 mm nodule in the posterior left lower lobe is unchanged. Continued attention on follow-up recommended. 2. No new or progressive findings on today's exam      01/30/2017 Procedure    Removal of percutaneous gastrostomy tube and port-a-cath.       REVIEW OF SYSTEMS:   Constitutional: Denies fevers, chills or abnormal weight  loss Eyes: Denies blurriness of vision Ears, nose, mouth, throat, and face: Denies mucositis or sore throat Respiratory: Denies cough, dyspnea or wheezes Cardiovascular: Denies palpitation, chest discomfort or lower extremity swelling Gastrointestinal:  Denies nausea, heartburn or change in bowel habits Skin: Denies abnormal skin rashes Lymphatics: Denies new lymphadenopathy or easy bruising Neurological:Denies numbness, tingling or new weaknesses Behavioral/Psych: Mood is stable, no new changes  All other systems were reviewed with the patient and are negative.  I have reviewed the past medical history, past surgical history, social history and family history with the patient and they are unchanged from previous note.  ALLERGIES:  is allergic to phenergan [promethazine hcl].  MEDICATIONS:  Current Outpatient Medications  Medication Sig Dispense Refill  . amLODipine (NORVASC) 10 MG tablet Take 1 tablet (10 mg total) by mouth daily. 30 tablet 1  . fenofibrate (TRICOR) 145 MG tablet Take 145 mg by mouth daily.     [name] levothyroxine (SYNTHROID, LEVOTHROID) 50 MCG tablet Take 50 mcg by mouth daily before breakfast.     . sodium fluoride (FLUORISHIELD) 1.1 % GEL dental gel Instill one drop of gel per tooth space of fluoride tray. Place over teeth for 5 minutes. Remove. Spit out excess. Repeat nightly. 120 mL prn   No current facility-administered medications for this visit.     PHYSICAL EXAMINATION: ECOG PERFORMANCE STATUS: 1 - Symptomatic but completely ambulatory  Vitals:   08/24/17 0854  BP: 130/80  Pulse: 78  Resp: 18  Temp: 98.2 F (36.8 C)  SpO2: 100%   Filed Weights   08/24/17 0854  Weight: 184 lb (83.5 kg)    GENERAL:alert, no distress and comfortable SKIN: skin color, texture, turgor are normal, no rashes or significant lesions EYES: normal, Conjunctiva are pink and non-injected, sclera clear OROPHARYNX:no exudate, no erythema and lips, buccal mucosa, and tongue  normal  NECK: Noted mild lymphedema around his neck LYMPH:  no palpable lymphadenopathy in the cervical, axillary or inguinal LUNGS: clear to auscultation and percussion with normal breathing effort HEART: regular rate & rhythm and no murmurs and no lower extremity edema ABDOMEN:abdomen soft, non-tender and normal bowel sounds Musculoskeletal:no cyanosis of digits and no clubbing  NEURO: alert & oriented x 3 with fluent speech, no focal motor/sensory deficits  LABORATORY DATA:  I have reviewed the data as listed    Component Value Date/Time   NA 140 02/16/2017 0841   K 4.1 02/16/2017 0841   CL 103 01/30/2017  0738   CO2 29 02/16/2017 0841   GLUCOSE 94 02/16/2017 0841   BUN 19.8 02/16/2017 0841   CREATININE 1.3 02/16/2017 0841   CALCIUM 10.1 02/16/2017 0841   PROT 6.8 02/16/2017 0841   ALBUMIN 4.1 02/16/2017 0841   AST 19 02/16/2017 0841   ALT 15 02/16/2017 0841   ALKPHOS 32 (L) 02/16/2017 0841   BILITOT 0.41 02/16/2017 0841   GFRNONAA 59 (L) 01/30/2017 0738   GFRAA >60 01/30/2017 0738    No results found for: SPEP, UPEP  Lab Results  Component Value Date   WBC 3.4 (L) 02/16/2017   NEUTROABS 2.5 02/16/2017   HGB 12.2 (L) 02/16/2017   HCT 35.9 (L) 02/16/2017   MCV 88.9 02/16/2017   PLT 273 02/16/2017      Chemistry      Component Value Date/Time   NA 140 02/16/2017 0841   K 4.1 02/16/2017 0841   CL 103 01/30/2017 0738   CO2 29 02/16/2017 0841   BUN 19.8 02/16/2017 0841   CREATININE 1.3 02/16/2017 0841      Component Value Date/Time   CALCIUM 10.1 02/16/2017 0841   ALKPHOS 32 (L) 02/16/2017 0841   AST 19 02/16/2017 0841   ALT 15 02/16/2017 0841   BILITOT 0.41 02/16/2017 0841      All questions were answered. The patient knows to call the clinic with any problems, questions or concerns. No barriers to learning was detected.  I spent 15 minutes counseling the patient face to face. The total time spent in the appointment was 20 minutes and more than 50% was on  counseling and review of test results  Heath Lark, MD 08/24/2017 10:01 AM

## 2017-09-21 DIAGNOSIS — R82998 Other abnormal findings in urine: Secondary | ICD-10-CM | POA: Diagnosis not present

## 2017-09-21 DIAGNOSIS — E038 Other specified hypothyroidism: Secondary | ICD-10-CM | POA: Diagnosis not present

## 2017-09-21 DIAGNOSIS — Z0000 Encounter for general adult medical examination without abnormal findings: Secondary | ICD-10-CM | POA: Diagnosis not present

## 2017-09-21 DIAGNOSIS — R7301 Impaired fasting glucose: Secondary | ICD-10-CM | POA: Diagnosis not present

## 2017-09-21 DIAGNOSIS — I10 Essential (primary) hypertension: Secondary | ICD-10-CM | POA: Diagnosis not present

## 2017-09-28 DIAGNOSIS — R7301 Impaired fasting glucose: Secondary | ICD-10-CM | POA: Diagnosis not present

## 2017-09-28 DIAGNOSIS — M1990 Unspecified osteoarthritis, unspecified site: Secondary | ICD-10-CM | POA: Diagnosis not present

## 2017-09-28 DIAGNOSIS — J302 Other seasonal allergic rhinitis: Secondary | ICD-10-CM | POA: Diagnosis not present

## 2017-09-28 DIAGNOSIS — Z0000 Encounter for general adult medical examination without abnormal findings: Secondary | ICD-10-CM | POA: Diagnosis not present

## 2017-09-28 DIAGNOSIS — C099 Malignant neoplasm of tonsil, unspecified: Secondary | ICD-10-CM | POA: Diagnosis not present

## 2017-09-28 DIAGNOSIS — Z1389 Encounter for screening for other disorder: Secondary | ICD-10-CM | POA: Diagnosis not present

## 2017-11-23 ENCOUNTER — Inpatient Hospital Stay: Payer: BLUE CROSS/BLUE SHIELD | Attending: Hematology and Oncology

## 2017-11-23 ENCOUNTER — Ambulatory Visit (HOSPITAL_COMMUNITY)
Admission: RE | Admit: 2017-11-23 | Discharge: 2017-11-23 | Disposition: A | Discharge status: Home / Self Care | Payer: BLUE CROSS/BLUE SHIELD | Source: Ambulatory Visit | Attending: Hematology and Oncology | Admitting: Hematology and Oncology

## 2017-11-23 DIAGNOSIS — R918 Other nonspecific abnormal finding of lung field: Secondary | ICD-10-CM

## 2017-11-23 DIAGNOSIS — N183 Chronic kidney disease, stage 3 (moderate): Secondary | ICD-10-CM | POA: Insufficient documentation

## 2017-11-23 DIAGNOSIS — D61818 Other pancytopenia: Secondary | ICD-10-CM | POA: Diagnosis not present

## 2017-11-23 DIAGNOSIS — R911 Solitary pulmonary nodule: Secondary | ICD-10-CM | POA: Diagnosis not present

## 2017-11-23 DIAGNOSIS — Z923 Personal history of irradiation: Secondary | ICD-10-CM | POA: Diagnosis not present

## 2017-11-23 DIAGNOSIS — D638 Anemia in other chronic diseases classified elsewhere: Secondary | ICD-10-CM | POA: Diagnosis not present

## 2017-11-23 DIAGNOSIS — C099 Malignant neoplasm of tonsil, unspecified: Secondary | ICD-10-CM

## 2017-11-23 DIAGNOSIS — C090 Malignant neoplasm of tonsillar fossa: Secondary | ICD-10-CM | POA: Insufficient documentation

## 2017-11-23 DIAGNOSIS — Z5111 Encounter for antineoplastic chemotherapy: Secondary | ICD-10-CM | POA: Diagnosis not present

## 2017-11-23 DIAGNOSIS — I2510 Atherosclerotic heart disease of native coronary artery without angina pectoris: Secondary | ICD-10-CM | POA: Insufficient documentation

## 2017-11-23 LAB — CBC WITH DIFFERENTIAL/PLATELET
BASOS ABS: 0 10*3/uL (ref 0.0–0.1)
Basophils Relative: 1 %
EOS PCT: 1 %
Eosinophils Absolute: 0.1 10*3/uL (ref 0.0–0.5)
HCT: 36.6 % — ABNORMAL LOW (ref 38.4–49.9)
HEMOGLOBIN: 12.6 g/dL — AB (ref 13.0–17.1)
LYMPHS ABS: 0.7 10*3/uL — AB (ref 0.9–3.3)
Lymphocytes Relative: 16 %
MCH: 30.1 pg (ref 27.2–33.4)
MCHC: 34.4 g/dL (ref 32.0–36.0)
MCV: 87.4 fL (ref 79.3–98.0)
Monocytes Absolute: 0.5 10*3/uL (ref 0.1–0.9)
Monocytes Relative: 12 %
NEUTROS PCT: 70 %
Neutro Abs: 3 10*3/uL (ref 1.5–6.5)
PLATELETS: 263 10*3/uL (ref 140–400)
RBC: 4.19 MIL/uL — AB (ref 4.20–5.82)
RDW: 13 % (ref 11.0–14.6)
WBC: 4.2 10*3/uL (ref 4.0–10.3)

## 2017-11-23 LAB — COMPREHENSIVE METABOLIC PANEL
ALK PHOS: 43 U/L (ref 38–126)
ALT: 21 U/L (ref 0–44)
ANION GAP: 8 (ref 5–15)
AST: 26 U/L (ref 15–41)
Albumin: 4.5 g/dL (ref 3.5–5.0)
BILIRUBIN TOTAL: 0.4 mg/dL (ref 0.3–1.2)
BUN: 29 mg/dL — ABNORMAL HIGH (ref 6–20)
CALCIUM: 10.3 mg/dL (ref 8.9–10.3)
CO2: 29 mmol/L (ref 22–32)
Chloride: 101 mmol/L (ref 98–111)
Creatinine, Ser: 1.45 mg/dL — ABNORMAL HIGH (ref 0.61–1.24)
GFR calc Af Amer: >60 mL/min (ref >60)
GFR, EST NON AFRICAN AMERICAN: 52 mL/min — AB (ref >60)
GLUCOSE: 97 mg/dL (ref 70–99)
Potassium: 4.2 mmol/L (ref 3.5–5.1)
Sodium: 138 mmol/L (ref 135–145)
TOTAL PROTEIN: 7.2 g/dL (ref 6.5–8.1)

## 2017-11-23 MED ORDER — IOPAMIDOL (ISOVUE-300) INJECTION 61%
100.0000 mL | Freq: Once | INTRAVENOUS | Status: AC | PRN
Start: 2017-11-23 — End: 2017-11-23
  Administered 2017-11-23: 100 mL via INTRAVENOUS

## 2017-11-23 MED ORDER — IOPAMIDOL (ISOVUE-300) INJECTION 61%
INTRAVENOUS | Status: AC
  Filled 2017-11-23: qty 100

## 2017-11-30 ENCOUNTER — Inpatient Hospital Stay (HOSPITAL_BASED_OUTPATIENT_CLINIC_OR_DEPARTMENT_OTHER): Payer: BLUE CROSS/BLUE SHIELD | Admitting: Hematology and Oncology

## 2017-11-30 ENCOUNTER — Telehealth: Payer: Self-pay | Admitting: Hematology and Oncology

## 2017-11-30 ENCOUNTER — Encounter: Payer: Self-pay | Admitting: Hematology and Oncology

## 2017-11-30 DIAGNOSIS — C099 Malignant neoplasm of tonsil, unspecified: Secondary | ICD-10-CM

## 2017-11-30 DIAGNOSIS — D638 Anemia in other chronic diseases classified elsewhere: Secondary | ICD-10-CM | POA: Diagnosis not present

## 2017-11-30 DIAGNOSIS — N183 Chronic kidney disease, stage 3 unspecified: Secondary | ICD-10-CM | POA: Insufficient documentation

## 2017-11-30 DIAGNOSIS — C090 Malignant neoplasm of tonsillar fossa: Secondary | ICD-10-CM | POA: Diagnosis not present

## 2017-11-30 NOTE — Assessment & Plan Note (Signed)
This is likely anemia of chronic disease. The patient denies recent history of bleeding such as epistaxis, hematuria or hematochezia. He is asymptomatic from the anemia. We will observe for now.  

## 2017-11-30 NOTE — Telephone Encounter (Signed)
Gave avs and calendar ° °

## 2017-11-30 NOTE — Assessment & Plan Note (Signed)
He has stable intermittent elevated creatinine I suspect he have chronic kidney disease stage III We discussed the importance of lifestyle modification and aggressive control of risk factors

## 2017-11-30 NOTE — Assessment & Plan Note (Signed)
He has almost completely recovered from all side effects of treatment. Clinically, he has no signs of cancer recurrence. CT imaging show no evidence of disease We discussed risk factor and lifestyle modification by avoiding alcohol intake or tobacco abuse He will continue close follow-up with ENT physician. I plan to see him back in a year for further follow-up

## 2017-11-30 NOTE — Progress Notes (Signed)
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 Clinically, he has no signs of cancer recurrence. CT imaging show no evidence of disease We discussed risk factor and lifestyle modification by avoiding alcohol intake or tobacco abuse He will continue close follow-up with ENT physician. I plan to see him back in a year for further follow-up   CKD (chronic kidney disease), stage III ([place]) He has stable intermittent elevated creatinine I suspect he have chronic kidney disease stage III We discussed the importance of lifestyle modification and aggressive control of risk factors  Anemia, chronic disease This is likely anemia of chronic disease. The patient denies recent history of bleeding such as epistaxis, hematuria or hematochezia. He is asymptomatic from the anemia. We will observe for now.   No orders of the defined types were placed in this encounter.   INTERVAL HISTORY: Please see below for problem oriented charting. He returns for further follow-up He feels well He had intermittent sinus congestion, stable No recent fever or chills His appetite is stable No new neck swelling or masses.  SUMMARY OF ONCOLOGIC HISTORY:   Tonsil cancer ([place])   02/07/2016 Imaging    Ct neck showed abnormal soft tissue within the left pharynx, extending from the left tonsillar pillar inferiorly to the level of the hyoid and filling the left vallecula. The appearance is most consistent with a neoplastic process. 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 Histologic  sampling and direct visualization should be considered. Confluent adenopathy within the left cervical chain with areas of central hypoattenuation suggesting degrees of necrosis. This is most suggestive of metastatic spread. The areas of necrosis within the adenopathy combined with the above-described pharyngeal soft tissue mass are together suggestive of squamous cell carcinoma.      02/14/2016 Procedure    He has FNA of left neck LN       02/14/2016 Pathology Results    [id number] cytology confirmed squamous cell carcinoma      02/26/2016 PET scan    Hypermetabolic left tonsillar in pharyngeal mass with hypermetabolic left sided station IA, station III, and station Ib adenopathy and hypermetabolic station IIa adenopathy on the right. Borderline hypermetabolic small right station IIb lymph node. Several tiny lung nodules are observed, these are not hypermetabolic but are also below the threshold level for accurate characterization. Dedicated CT chest may be warranted to act as a baseline.      03/20/2016 Procedure    Placement of a right internal jugular approach power injectable Port-A-Cath. Fluoroscopic insertion of a 20-French pull-through gastrostomy tube.       03/24/2016 - 05/06/2016 Chemotherapy    He received high dose cisplatin; dose 2 and 3 with dose reduction due to side-effects      03/24/2016 - 05/15/2016 Radiation Therapy    Site/dose:  Left Tonsil and Bilateral Neck / 70 Gy in 35 fractions to gross disease, 63 Gy in 35 fractions to high risk nodal echelons, and 56 Gy in 35 fractions to intermediate risk nodal echelons. Beams/energy:   Helical IMRT / 6 MV photons      09/04/2016 Imaging    1. Significant  response to therapy of left oropharyngeal/palatine tonsil primary. Low-level residual hypermetabolism could be treatment related or represent residual disease. 2. Resolution of cervical adenopathy. Low-level hypermetabolism within only 1 level 2 node remains. 3. No evidence of  hypermetabolic extra cervical disease. 4. Age advanced coronary artery atherosclerosis. Recommend assessment of coronary risk factors and consideration of medical therapy. 5. Progressive sinus disease. 6. Bilateral pulmonary nodules. A right lower lobe nodule may be new. Below PET resolution. Recommend attention on follow-up.       11/24/2016 Imaging    1. Post radiation changes in the neck. Left tonsillar mass and malignant lymphadenopathy appear resolved by CT, with residual 7-8 mm short axis bilateral level 2 lymph nodes, rim calcified on the left. 2. Interval bilateral maxilla wisdom tooth extractions with new inflammation in the right maxillary sinus which appears related to the tooth extraction site. Recommend ENT consultation. 3. Chest CT today is reported separately.      11/24/2016 Imaging    1. 3 mm posterior right lower lobe pulmonary nodule seen on the previous PET-CT has resolved completely in the interval. With 3 mm nodule in the posterior left lower lobe is unchanged. Continued attention on follow-up recommended. 2. No new or progressive findings on today's exam      01/30/2017 Procedure    Removal of percutaneous gastrostomy tube and port-a-cath.      11/23/2017 Imaging    Negative for recurrent tumor or adenopathy in the neck.  Improvement in post radiation changes involving the anterior neck.  Near complete opacification right maxillary sinus. This is likely odontogenic due to bony defect from prior third molar extraction.      11/23/2017 Imaging    1. Stable 3 mm posterior left lower lobe lung nodule is unchanged from previous exam. Favor benign abnormality. No new pulmonary nodules or evidence of metastatic disease identified. 2. Lad coronary artery atherosclerotic calcifications.       REVIEW OF SYSTEMS:   Constitutional: Denies fevers, chills or abnormal weight loss Eyes: Denies blurriness of vision Ears, nose, mouth, throat, and face: Denies mucositis or sore  throat Respiratory: Denies cough, dyspnea or wheezes Cardiovascular: Denies palpitation, chest discomfort or lower extremity swelling Gastrointestinal:  Denies nausea, heartburn or change in bowel habits Skin: Denies abnormal skin rashes Lymphatics: Denies new lymphadenopathy or easy bruising Neurological:Denies numbness, tingling or new weaknesses Behavioral/Psych: Mood is stable, no new changes  All other systems were reviewed with the patient and are negative.  I have reviewed the past medical history, past surgical history, social history and family history with the patient and they are unchanged from previous note.  ALLERGIES:  is allergic to phenergan [promethazine hcl].  MEDICATIONS:  Current Outpatient Medications  Medication Sig Dispense Refill  . amLODipine (NORVASC) 10 MG tablet Take 1 tablet (10 mg total) by mouth daily. 30 tablet 1  . fenofibrate (TRICOR) 145 MG tablet Take 145 mg by mouth daily.     [name] levothyroxine (SYNTHROID, LEVOTHROID) 50 MCG tablet Take 75 mcg by mouth daily before breakfast.    . sodium fluoride (FLUORISHIELD) 1.1 % GEL dental gel Instill one drop of gel per tooth space of fluoride tray. Place over teeth for 5 minutes. Remove. Spit out excess. Repeat nightly. 120 mL prn   No current facility-administered medications for this visit.     PHYSICAL EXAMINATION: ECOG PERFORMANCE STATUS: 0 - Asymptomatic  Vitals:   11/30/17 0913  BP: 120/72  Pulse: (!) 57  Resp: 18  Temp: 98.1 F (36.7  C)  SpO2: 100%   Filed Weights   11/30/17 0913  Weight: 180 lb (81.6 kg)    GENERAL:alert, no distress and comfortable NEURO: alert & oriented x 3 with fluent speech, no focal motor/sensory deficits  LABORATORY DATA:  I have reviewed the data as listed    Component Value Date/Time   NA 138 11/23/2017 1126   NA 140 02/16/2017 0841   K 4.2 11/23/2017 1126   K 4.1 02/16/2017 0841   CL 101 11/23/2017 1126   CO2 29 11/23/2017 1126   CO2 29 02/16/2017 0841    GLUCOSE 97 11/23/2017 1126   GLUCOSE 94 02/16/2017 0841   BUN 29 (H) 11/23/2017 1126   BUN 19.8 02/16/2017 0841   CREATININE 1.45 (H) 11/23/2017 1126   CREATININE 1.3 02/16/2017 0841   CALCIUM 10.3 11/23/2017 1126   CALCIUM 10.1 02/16/2017 0841   PROT 7.2 11/23/2017 1126   PROT 6.8 02/16/2017 0841   ALBUMIN 4.5 11/23/2017 1126   ALBUMIN 4.1 02/16/2017 0841   AST 26 11/23/2017 1126   AST 19 02/16/2017 0841   ALT 21 11/23/2017 1126   ALT 15 02/16/2017 0841   ALKPHOS 43 11/23/2017 1126   ALKPHOS 32 (L) 02/16/2017 0841   BILITOT 0.4 11/23/2017 1126   BILITOT 0.41 02/16/2017 0841   GFRNONAA 52 (L) 11/23/2017 1126   GFRAA >60 11/23/2017 1126    No results found for: SPEP, UPEP  Lab Results  Component Value Date   WBC 4.2 11/23/2017   NEUTROABS 3.0 11/23/2017   HGB 12.6 (L) 11/23/2017   HCT 36.6 (L) 11/23/2017   MCV 87.4 11/23/2017   PLT 263 11/23/2017      Chemistry      Component Value Date/Time   NA 138 11/23/2017 1126   NA 140 02/16/2017 0841   K 4.2 11/23/2017 1126   K 4.1 02/16/2017 0841   CL 101 11/23/2017 1126   CO2 29 11/23/2017 1126   CO2 29 02/16/2017 0841   BUN 29 (H) 11/23/2017 1126   BUN 19.8 02/16/2017 0841   CREATININE 1.45 (H) 11/23/2017 1126   CREATININE 1.3 02/16/2017 0841      Component Value Date/Time   CALCIUM 10.3 11/23/2017 1126   CALCIUM 10.1 02/16/2017 0841   ALKPHOS 43 11/23/2017 1126   ALKPHOS 32 (L) 02/16/2017 0841   AST 26 11/23/2017 1126   AST 19 02/16/2017 0841   ALT 21 11/23/2017 1126   ALT 15 02/16/2017 0841   BILITOT 0.4 11/23/2017 1126   BILITOT 0.41 02/16/2017 0841       RADIOGRAPHIC STUDIES: I have reviewed multiple imaging study with the patient and his wife I have personally reviewed the radiological images as listed and agreed with the findings in the report. Ct Soft Tissue Neck W Contrast  Result Date: 11/23/2017 CLINICAL DATA:  Tonsil cancer post treatment chemo and radiation EXAM: CT NECK WITH CONTRAST  TECHNIQUE: Multidetector CT imaging of the neck was performed using the standard protocol following the bolus administration of intravenous contrast. CONTRAST:  147mL ISOVUE-300 IOPAMIDOL (ISOVUE-300) INJECTION 61% COMPARISON:  CT 11/24/2016 FINDINGS: Pharynx and larynx: Pharyngeal mucosal edema compatible with post radiation change. Thickening of the epiglottis. No recurrent mass lesion in the region of the left tonsil. Salivary glands: Post radiation changes in the submandibular gland which show hyperenhancement bilaterally. Parotid gland normal bilaterally. Thyroid: Negative Lymph nodes: No recurrent adenopathy. Continued shrinkage of left level 2 lymph node with peripheral calcification now measuring approximately 4 mm in short axis diameter.  No new adenopathy. Soft tissue thickening at the region of the previous lymph node mass in the left level 2 lymph node station. Vascular: Normal carotid and jugular enhancement Limited intracranial: Negative Visualized orbits: Negative Mastoids and visualized paranasal sinuses: Near complete opacification right maxillary sinus. Progression of mucosal edema since the prior study. Previous air-fluid level has resolved. Mucosal edema left maxillary sinus is mild and unchanged. Mastoid sinus clear bilaterally. Skeleton: Cervical spondylosis. No acute skeletal abnormality. Right upper molar extraction with bone defect into the maxillary sinus unchanged. Upper chest: Chest CT from today reported separately Other: Post radiation changes in the anterior neck appear improved with decrease and skin and soft tissue edema compared to the prior CT IMPRESSION: Negative for recurrent tumor or adenopathy in the neck. Improvement in post radiation changes involving the anterior neck. Near complete opacification right maxillary sinus. This is likely odontogenic due to bony defect from prior third molar extraction. Electronically Signed   By: Franchot Gallo M.D.   On: 11/23/2017 13:21   Ct  Chest W Contrast  Result Date: 11/23/2017 CLINICAL DATA:  Left tonsillar fossa cancer. Status post chemo and radiation. Restaging. EXAM: CT CHEST WITH CONTRAST TECHNIQUE: Multidetector CT imaging of the chest was performed during intravenous contrast administration. CONTRAST:  148mL ISOVUE-300 IOPAMIDOL (ISOVUE-300) INJECTION 61% COMPARISON:  11/24/2016 FINDINGS: Cardiovascular: The heart size appears within normal limits. No pericardial effusion identified. Calcification in the LAD coronary artery noted. Mediastinum/Nodes: No enlarged mediastinal, hilar, or axillary lymph nodes. Thyroid gland, trachea, and esophagus demonstrate no significant findings. Lungs/Pleura: Biapical scarring noted. Left lower lobe lung nodule measures 3 mm, image 83/7. Unchanged from previous exam. No new pulmonary nodules identified. Upper Abdomen: No acute abnormality. Musculoskeletal: Spondylosis identified within the thoracic spine. IMPRESSION: 1. Stable 3 mm posterior left lower lobe lung nodule is unchanged from previous exam. Favor benign abnormality. No new pulmonary nodules or evidence of metastatic disease identified. 2. Lad coronary artery atherosclerotic calcifications. Electronically Signed   By: Kerby Moors M.D.   On: 11/23/2017 14:24    All questions were answered. The patient knows to call the clinic with any problems, questions or concerns. No barriers to learning was detected.  I spent 10 minutes counseling the patient face to face. The total time spent in the appointment was 15 minutes and more than 50% was on counseling and review of test results  Heath Lark, MD 11/30/2017 12:15 PM

## 2018-03-01 DIAGNOSIS — C109 Malignant neoplasm of oropharynx, unspecified: Secondary | ICD-10-CM | POA: Diagnosis not present

## 2018-03-24 NOTE — Therapy (Signed)
[facility] [address] [place], [place], [address] Phone: [phone]   Fax:  [phone]  Patient Details  Name: Todd [name] MRN: [id number] Date of Birth: [birth year] Referring Provider:  No ref. provider found  Encounter Date: 03/24/2018  SPEECH THERAPY DISCHARGE SUMMARY  Visits from Start of Care: 6  Current functional level related to goals / functional outcomes: Pt did not schedule more ST after his last scheduled visit in [place] 2018. Goals and impression after that visit follow:  SLP Short Term Goals - 08/07/16 1045              SLP SHORT TERM GOAL #1    Title pt will complete HEP with rare min A    Status Not Met         SLP SHORT TERM GOAL #2    Title pt will demo knowledge of 3 overt s/s aspiration PNA with modified indpendnece    Status Achieved         SLP SHORT TERM GOAL #3    Title pt will tell SLP how a food journal can assist in return to more normal PO diet    Status Achieved                       SLP Long Term Goals - 10/03/16 [place] #1    Title pt will complete HEP with modified indpendence over 2 therapy visits    Baseline 09/05/16    Time 3    Period --  visits    Status On-going         SLP LONG TERM GOAL #2    Title pt will tell SLP when to switch frequency of HEP from 2-3/day to x2/week    Time 3    Period --  visits    Status On-going         SLP LONG TERM GOAL #3    Title pt will follow any precautions from recommended modified barium swallow exam wiht modified independence    Time 3    Period --  visits    Status New                       Plan - 10/03/16 1521     Clinical Impression Statement SLP suspects cont mild fibrotic tissue in bil submandibular region due to noncompliance with HEP vs. lymphedema. Pt has cont'd HEP to once/day and is doing some of the HEP twice on some days (cont noncompliant), SLP again strongly told pt  BID was needed. See "other treatment/comments" for more details. Pt with cont'd consistent throat clearing with solids and liquids. A modified barium swallow is recommended to r/o oropharyngeal dysphagia caused by uscle weakness/reduced ROM. He would cont to benefit from skilled ST approx once every four weeks, to maintain safe swallow function and mitigate danger of aspiration/aspiration PNA. See goal update for current progress on goals. All goals will be renewed.     Remaining deficits: Unknown as pt was last seen May 2018 and did not schedule more ST.     Education / Equipment: LAte effects head/neck radiation on swallowing, HEP procedure.   Plan: Patient agrees to discharge.  Patient goals were not met. Patient is being discharged due to not returning since the last visit.  ?????     [facility] ,MS,  CCC-SLP  03/24/2018, 9:31 AM  [facility] [address] [place], [place], [address] Phone: [phone]   Fax:  [phone]

## 2018-05-24 DIAGNOSIS — I10 Essential (primary) hypertension: Secondary | ICD-10-CM | POA: Diagnosis not present

## 2018-05-24 DIAGNOSIS — R7301 Impaired fasting glucose: Secondary | ICD-10-CM | POA: Diagnosis not present

## 2018-05-24 DIAGNOSIS — E7800 Pure hypercholesterolemia, unspecified: Secondary | ICD-10-CM | POA: Diagnosis not present

## 2018-05-24 DIAGNOSIS — E038 Other specified hypothyroidism: Secondary | ICD-10-CM | POA: Diagnosis not present

## 2018-10-05 DIAGNOSIS — E038 Other specified hypothyroidism: Secondary | ICD-10-CM | POA: Diagnosis not present

## 2018-10-05 DIAGNOSIS — Z0000 Encounter for general adult medical examination without abnormal findings: Secondary | ICD-10-CM | POA: Diagnosis not present

## 2018-10-05 DIAGNOSIS — R7301 Impaired fasting glucose: Secondary | ICD-10-CM | POA: Diagnosis not present

## 2018-10-05 DIAGNOSIS — I10 Essential (primary) hypertension: Secondary | ICD-10-CM | POA: Diagnosis not present

## 2018-10-11 DIAGNOSIS — N183 Chronic kidney disease, stage 3 (moderate): Secondary | ICD-10-CM | POA: Diagnosis not present

## 2018-10-11 DIAGNOSIS — Z0000 Encounter for general adult medical examination without abnormal findings: Secondary | ICD-10-CM | POA: Diagnosis not present

## 2018-10-11 DIAGNOSIS — C099 Malignant neoplasm of tonsil, unspecified: Secondary | ICD-10-CM | POA: Diagnosis not present

## 2018-10-11 DIAGNOSIS — I129 Hypertensive chronic kidney disease with stage 1 through stage 4 chronic kidney disease, or unspecified chronic kidney disease: Secondary | ICD-10-CM | POA: Diagnosis not present

## 2018-10-11 DIAGNOSIS — D61818 Other pancytopenia: Secondary | ICD-10-CM | POA: Diagnosis not present

## 2018-11-22 DIAGNOSIS — M5416 Radiculopathy, lumbar region: Secondary | ICD-10-CM | POA: Diagnosis not present

## 2018-11-22 DIAGNOSIS — M545 Low back pain: Secondary | ICD-10-CM | POA: Diagnosis not present

## 2018-12-01 ENCOUNTER — Telehealth: Payer: Self-pay

## 2018-12-01 NOTE — Telephone Encounter (Signed)
Called and given below message. Wife verbalized understanding. Theywould like to cancel appts.

## 2018-12-01 NOTE — Telephone Encounter (Signed)
-----   Message from Heath Lark, MD sent at 12/01/2018 12:59 PM EDT ----- Regarding: is it ok to cancel labs on Monday I have scanned results of labs from PCP Can we cancel labs on Monday? If he agrees, pls cancel lab appt

## 2018-12-04 DIAGNOSIS — M545 Low back pain: Secondary | ICD-10-CM | POA: Diagnosis not present

## 2018-12-06 ENCOUNTER — Other Ambulatory Visit: Payer: Self-pay

## 2018-12-06 ENCOUNTER — Inpatient Hospital Stay: Payer: BC Managed Care – PPO | Attending: Hematology and Oncology | Admitting: Hematology and Oncology

## 2018-12-06 ENCOUNTER — Telehealth: Payer: Self-pay | Admitting: Hematology and Oncology

## 2018-12-06 ENCOUNTER — Encounter: Payer: Self-pay | Admitting: Hematology and Oncology

## 2018-12-06 DIAGNOSIS — C099 Malignant neoplasm of tonsil, unspecified: Secondary | ICD-10-CM | POA: Diagnosis not present

## 2018-12-06 DIAGNOSIS — M25551 Pain in right hip: Secondary | ICD-10-CM

## 2018-12-06 NOTE — Telephone Encounter (Signed)
I talk with patient regarding schedule  

## 2018-12-06 NOTE — Assessment & Plan Note (Signed)
He has intermittent right hip pain that resolved with multiple courses of prednisone therapy He had imaging and MRI with follow-up to see orthopedic surgeon I would defer to him for further management

## 2018-12-06 NOTE — Telephone Encounter (Signed)
I talk with patient

## 2018-12-06 NOTE — Assessment & Plan Note (Signed)
Clinically, he has no signs of cancer recurrence. We discussed risk factor and lifestyle modification by avoiding alcohol intake or tobacco abuse He will continue close follow-up with ENT physician. I plan to see him back in a year for further follow-up  

## 2018-12-06 NOTE — Progress Notes (Signed)
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 We discussed risk factor and lifestyle modification by avoiding alcohol intake or tobacco abuse He will continue close follow-up with ENT physician. I plan to see him back in a year for further follow-up   Right hip pain He has intermittent right hip pain that resolved with multiple courses of prednisone therapy He had imaging and MRI with follow-up to see orthopedic surgeon I would defer to him for further management   No orders of the defined types were placed in this encounter.   INTERVAL HISTORY: Please see below for problem oriented charting. He returns for further follow-up Denies new oral lesions No swallowing difficulties He had negative ENT exam recently He wears a hearing aid He had mild injury to his right hip recently and was prescribed prednisone therapy He has appointment to follow with orthopedic surgeon and had imaging study done recently, results unavailable He denies smoking or drinking No new neck lumps  SUMMARY OF ONCOLOGIC HISTORY: Oncology History  Tonsil cancer ([place])  02/07/2016 Imaging   Ct neck showed abnormal soft tissue within the left pharynx, extending from the left tonsillar pillar inferiorly to the level of the hyoid and filling the left vallecula. The appearance is most consistent with a neoplastic process. 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 Histologic sampling and direct visualization should be considered. Confluent adenopathy within the left cervical chain with areas of central  hypoattenuation suggesting degrees of necrosis. This is most suggestive of metastatic spread. The areas of necrosis within the adenopathy combined with the above-described pharyngeal soft tissue mass are together suggestive of squamous cell carcinoma.   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 Borderline hypermetabolic small right station IIb lymph node. Several tiny lung nodules are observed, these are not hypermetabolic but are also below the threshold level for accurate characterization. Dedicated CT chest may be warranted to act as a baseline.   03/20/2016 Procedure   Placement of a right internal jugular approach power injectable Port-A-Cath. Fluoroscopic insertion of a 20-French pull-through gastrostomy tube.    03/24/2016 - 05/06/2016 Chemotherapy   He received high dose cisplatin; dose 2 and 3 with dose reduction due to side-effects   03/24/2016 - 05/15/2016 Radiation Therapy   Site/dose:  Left Tonsil and Bilateral Neck / 70 Gy in 35 fractions to gross disease, 63 Gy in 35 fractions to high risk nodal echelons, and 56 Gy in 35 fractions to intermediate risk nodal echelons. Beams/energy:   Helical IMRT / 6 MV photons   09/04/2016 Imaging   1. Significant response to therapy of left oropharyngeal/palatine tonsil primary. Low-level residual hypermetabolism could be treatment related or represent residual disease. 2. Resolution of cervical adenopathy. Low-level hypermetabolism within only 1 level 2 node remains. 3. No evidence of hypermetabolic extra cervical disease. 4. Age advanced coronary artery atherosclerosis. Recommend  assessment of coronary risk factors and consideration of medical  therapy. 5. Progressive sinus disease. 6. Bilateral pulmonary nodules. A right lower lobe nodule may be new. Below PET resolution. Recommend attention on follow-up.    11/24/2016 Imaging   1. Post radiation changes in the neck. Left tonsillar mass and malignant lymphadenopathy appear resolved by CT, with residual 7-8 mm short axis bilateral level 2 lymph nodes, rim calcified on the left. 2. Interval bilateral maxilla wisdom tooth extractions with new inflammation in the right maxillary sinus which appears related to the tooth extraction site. Recommend ENT consultation. 3. Chest CT today is reported separately.   11/24/2016 Imaging   1. 3 mm posterior right lower lobe pulmonary nodule seen on the previous PET-CT has resolved completely in the interval. With 3 mm nodule in the posterior left lower lobe is unchanged. Continued attention on follow-up recommended. 2. No new or progressive findings on today's exam   01/30/2017 Procedure   Removal of percutaneous gastrostomy tube and port-a-cath.   11/23/2017 Imaging   Negative for recurrent tumor or adenopathy in the neck.  Improvement in post radiation changes involving the anterior neck.  Near complete opacification right maxillary sinus. This is likely odontogenic due to bony defect from prior third molar extraction.   11/23/2017 Imaging   1. Stable 3 mm posterior left lower lobe lung nodule is unchanged from previous exam. Favor benign abnormality. No new pulmonary nodules or evidence of metastatic disease identified. 2. Lad coronary artery atherosclerotic calcifications.     REVIEW OF SYSTEMS:   Constitutional: Denies fevers, chills or abnormal weight loss Eyes: Denies blurriness of vision Ears, nose, mouth, throat, and face: Denies mucositis or sore throat Respiratory: Denies cough, dyspnea or wheezes Cardiovascular: Denies palpitation, chest discomfort or lower extremity swelling Gastrointestinal:  Denies nausea, heartburn or change in  bowel habits Skin: Denies abnormal skin rashes Lymphatics: Denies new lymphadenopathy or easy bruising Neurological:Denies numbness, tingling or new weaknesses Behavioral/Psych: Mood is stable, no new changes  All other systems were reviewed with the patient and are negative.  I have reviewed the past medical history, past surgical history, social history and family history with the patient and they are unchanged from previous note.  ALLERGIES:  is allergic to phenergan [promethazine hcl].  MEDICATIONS:  Current Outpatient Medications  Medication Sig Dispense Refill  . amLODipine (NORVASC) 10 MG tablet Take 1 tablet (10 mg total) by mouth daily. 30 tablet 1  . fenofibrate (TRICOR) 145 MG tablet Take 145 mg by mouth daily.     [name] levothyroxine (SYNTHROID, LEVOTHROID) 50 MCG tablet Take 75 mcg by mouth daily before breakfast.    . sodium fluoride (FLUORISHIELD) 1.1 % GEL dental gel Instill one drop of gel per tooth space of fluoride tray. Place over teeth for 5 minutes. Remove. Spit out excess. Repeat nightly. 120 mL prn   No current facility-administered medications for this visit.     PHYSICAL EXAMINATION: ECOG PERFORMANCE STATUS: 1 - Symptomatic but completely ambulatory  Vitals:   12/06/18 0836  BP: 130/80  Pulse: 64  Resp: 18  Temp: 98.2 F (36.8 C)  SpO2: 98%   Filed Weights   12/06/18 0836  Weight: 180 lb 3.2 oz (81.7 kg)    GENERAL:alert, no distress and comfortable SKIN: skin color, texture, turgor are normal, no rashes or significant lesions EYES: normal, Conjunctiva are pink and non-injected, sclera clear OROPHARYNX:no exudate, no erythema and lips, buccal mucosa, and tongue normal  NECK: Noted skin thickening around his neck  consistent with prior radiation ty LYMPH:  no palpable lymphadenopathy in the cervical, axillary or inguinal LUNGS: clear to auscultation and percussion with normal breathing effort HEART: regular rate & rhythm and no murmurs and no lower  extremity edema ABDOMEN:abdomen soft, non-tender and normal bowel sounds Musculoskeletal:no cyanosis of digits and no clubbing  NEURO: alert & oriented x 3 with fluent speech, no focal motor/sensory deficits  LABORATORY DATA:  I have reviewed the data as listed    Component Value Date/Time   NA 138 11/23/2017 1126   NA 140 02/16/2017 0841   K 4.2 11/23/2017 1126   K 4.1 02/16/2017 0841   CL 101 11/23/2017 1126   CO2 29 11/23/2017 1126   CO2 29 02/16/2017 0841   GLUCOSE 97 11/23/2017 1126   GLUCOSE 94 02/16/2017 0841   BUN 29 (H) 11/23/2017 1126   BUN 19.8 02/16/2017 0841   CREATININE 1.45 (H) 11/23/2017 1126   CREATININE 1.3 02/16/2017 0841   CALCIUM 10.3 11/23/2017 1126   CALCIUM 10.1 02/16/2017 0841   PROT 7.2 11/23/2017 1126   PROT 6.8 02/16/2017 0841   ALBUMIN 4.5 11/23/2017 1126   ALBUMIN 4.1 02/16/2017 0841   AST 26 11/23/2017 1126   AST 19 02/16/2017 0841   ALT 21 11/23/2017 1126   ALT 15 02/16/2017 0841   ALKPHOS 43 11/23/2017 1126   ALKPHOS 32 (L) 02/16/2017 0841   BILITOT 0.4 11/23/2017 1126   BILITOT 0.41 02/16/2017 0841   GFRNONAA 52 (L) 11/23/2017 1126   GFRAA >60 11/23/2017 1126    No results found for: SPEP, UPEP  Lab Results  Component Value Date   WBC 4.2 11/23/2017   NEUTROABS 3.0 11/23/2017   HGB 12.6 (L) 11/23/2017   HCT 36.6 (L) 11/23/2017   MCV 87.4 11/23/2017   PLT 263 11/23/2017      Chemistry      Component Value Date/Time   NA 138 11/23/2017 1126   NA 140 02/16/2017 0841   K 4.2 11/23/2017 1126   K 4.1 02/16/2017 0841   CL 101 11/23/2017 1126   CO2 29 11/23/2017 1126   CO2 29 02/16/2017 0841   BUN 29 (H) 11/23/2017 1126   BUN 19.8 02/16/2017 0841   CREATININE 1.45 (H) 11/23/2017 1126   CREATININE 1.3 02/16/2017 0841      Component Value Date/Time   CALCIUM 10.3 11/23/2017 1126   CALCIUM 10.1 02/16/2017 0841   ALKPHOS 43 11/23/2017 1126   ALKPHOS 32 (L) 02/16/2017 0841   AST 26 11/23/2017 1126   AST 19 02/16/2017 0841    ALT 21 11/23/2017 1126   ALT 15 02/16/2017 0841   BILITOT 0.4 11/23/2017 1126   BILITOT 0.41 02/16/2017 0841      All questions were answered. The patient knows to call the clinic with any problems, questions or concerns. No barriers to learning was detected.  I spent 10 minutes counseling the patient face to face. The total time spent in the appointment was 15 minutes and more than 50% was on counseling and review of test results  Heath Lark, MD 12/06/2018 10:40 AM

## 2018-12-10 DIAGNOSIS — M5416 Radiculopathy, lumbar region: Secondary | ICD-10-CM | POA: Diagnosis not present

## 2018-12-10 DIAGNOSIS — M545 Low back pain: Secondary | ICD-10-CM | POA: Diagnosis not present

## 2018-12-10 DIAGNOSIS — M5116 Intervertebral disc disorders with radiculopathy, lumbar region: Secondary | ICD-10-CM | POA: Diagnosis not present

## 2018-12-14 ENCOUNTER — Encounter: Payer: Self-pay | Admitting: *Deleted

## 2018-12-14 NOTE — Progress Notes (Signed)
Oncology Nurse Navigator Documentation  Flowsheet/spreadsheet update.  Rick Diehl, RN, BSN Head & Neck Oncology Nurse Navigator [facility] [place] [phone]   

## 2018-12-27 DIAGNOSIS — M5416 Radiculopathy, lumbar region: Secondary | ICD-10-CM | POA: Diagnosis not present

## 2019-01-03 DIAGNOSIS — M5416 Radiculopathy, lumbar region: Secondary | ICD-10-CM | POA: Diagnosis not present

## 2019-01-08 DIAGNOSIS — M5416 Radiculopathy, lumbar region: Secondary | ICD-10-CM | POA: Diagnosis not present

## 2019-01-10 DIAGNOSIS — M545 Low back pain: Secondary | ICD-10-CM | POA: Diagnosis not present

## 2019-01-14 DIAGNOSIS — M545 Low back pain: Secondary | ICD-10-CM | POA: Diagnosis not present

## 2019-01-24 DIAGNOSIS — M5136 Other intervertebral disc degeneration, lumbar region: Secondary | ICD-10-CM | POA: Diagnosis not present

## 2019-01-24 DIAGNOSIS — M5416 Radiculopathy, lumbar region: Secondary | ICD-10-CM | POA: Diagnosis not present

## 2019-01-27 DIAGNOSIS — M545 Low back pain: Secondary | ICD-10-CM | POA: Diagnosis not present

## 2019-01-31 DIAGNOSIS — M545 Low back pain: Secondary | ICD-10-CM | POA: Diagnosis not present

## 2019-02-11 DIAGNOSIS — M545 Low back pain: Secondary | ICD-10-CM | POA: Diagnosis not present

## 2019-02-14 DIAGNOSIS — M5416 Radiculopathy, lumbar region: Secondary | ICD-10-CM | POA: Diagnosis not present

## 2019-02-21 DIAGNOSIS — M545 Low back pain: Secondary | ICD-10-CM | POA: Diagnosis not present

## 2019-02-28 DIAGNOSIS — M545 Low back pain: Secondary | ICD-10-CM | POA: Diagnosis not present

## 2019-03-07 DIAGNOSIS — M545 Low back pain: Secondary | ICD-10-CM | POA: Diagnosis not present

## 2019-04-25 DIAGNOSIS — N1831 Chronic kidney disease, stage 3a: Secondary | ICD-10-CM | POA: Diagnosis not present

## 2019-04-25 DIAGNOSIS — E039 Hypothyroidism, unspecified: Secondary | ICD-10-CM | POA: Diagnosis not present

## 2019-04-25 DIAGNOSIS — I129 Hypertensive chronic kidney disease with stage 1 through stage 4 chronic kidney disease, or unspecified chronic kidney disease: Secondary | ICD-10-CM | POA: Diagnosis not present

## 2019-04-25 DIAGNOSIS — E7800 Pure hypercholesterolemia, unspecified: Secondary | ICD-10-CM | POA: Diagnosis not present

## 2019-04-28 DIAGNOSIS — E7800 Pure hypercholesterolemia, unspecified: Secondary | ICD-10-CM | POA: Diagnosis not present

## 2019-05-02 DIAGNOSIS — R7301 Impaired fasting glucose: Secondary | ICD-10-CM | POA: Diagnosis not present

## 2019-05-02 DIAGNOSIS — E7800 Pure hypercholesterolemia, unspecified: Secondary | ICD-10-CM | POA: Diagnosis not present

## 2019-10-19 DIAGNOSIS — Z0000 Encounter for general adult medical examination without abnormal findings: Secondary | ICD-10-CM | POA: Diagnosis not present

## 2019-10-19 DIAGNOSIS — R7301 Impaired fasting glucose: Secondary | ICD-10-CM | POA: Diagnosis not present

## 2019-10-19 DIAGNOSIS — Z125 Encounter for screening for malignant neoplasm of prostate: Secondary | ICD-10-CM | POA: Diagnosis not present

## 2019-10-19 DIAGNOSIS — E038 Other specified hypothyroidism: Secondary | ICD-10-CM | POA: Diagnosis not present

## 2019-10-19 DIAGNOSIS — E7800 Pure hypercholesterolemia, unspecified: Secondary | ICD-10-CM | POA: Diagnosis not present

## 2019-10-24 DIAGNOSIS — I129 Hypertensive chronic kidney disease with stage 1 through stage 4 chronic kidney disease, or unspecified chronic kidney disease: Secondary | ICD-10-CM | POA: Diagnosis not present

## 2019-10-24 DIAGNOSIS — Z0000 Encounter for general adult medical examination without abnormal findings: Secondary | ICD-10-CM | POA: Diagnosis not present

## 2019-10-24 DIAGNOSIS — N1832 Chronic kidney disease, stage 3b: Secondary | ICD-10-CM | POA: Diagnosis not present

## 2019-10-24 DIAGNOSIS — R82998 Other abnormal findings in urine: Secondary | ICD-10-CM | POA: Diagnosis not present

## 2019-10-24 DIAGNOSIS — M1990 Unspecified osteoarthritis, unspecified site: Secondary | ICD-10-CM | POA: Diagnosis not present

## 2019-10-24 DIAGNOSIS — Z1331 Encounter for screening for depression: Secondary | ICD-10-CM | POA: Diagnosis not present

## 2019-10-24 DIAGNOSIS — R7301 Impaired fasting glucose: Secondary | ICD-10-CM | POA: Diagnosis not present

## 2019-10-27 DIAGNOSIS — Z1212 Encounter for screening for malignant neoplasm of rectum: Secondary | ICD-10-CM | POA: Diagnosis not present

## 2019-12-06 ENCOUNTER — Ambulatory Visit: Payer: BC Managed Care – PPO | Admitting: Hematology and Oncology

## 2019-12-12 ENCOUNTER — Ambulatory Visit: Payer: BC Managed Care – PPO | Admitting: Hematology and Oncology

## 2019-12-19 ENCOUNTER — Encounter: Payer: Self-pay | Admitting: Hematology and Oncology

## 2019-12-19 ENCOUNTER — Inpatient Hospital Stay: Payer: BC Managed Care – PPO | Attending: Hematology and Oncology | Admitting: Hematology and Oncology

## 2019-12-19 ENCOUNTER — Telehealth: Payer: Self-pay | Admitting: Hematology and Oncology

## 2019-12-19 ENCOUNTER — Other Ambulatory Visit: Payer: Self-pay

## 2019-12-19 DIAGNOSIS — C099 Malignant neoplasm of tonsil, unspecified: Secondary | ICD-10-CM | POA: Diagnosis not present

## 2019-12-19 DIAGNOSIS — R682 Dry mouth, unspecified: Secondary | ICD-10-CM | POA: Insufficient documentation

## 2019-12-19 NOTE — Progress Notes (Signed)
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 We discussed risk factor and lifestyle modification by avoiding alcohol intake or tobacco abuse He will continue close follow-up with ENT physician. I plan to see him back in a year for further follow-up    No orders of the defined types were placed in this encounter.   All questions were answered. The patient knows to call the clinic with any problems, questions or concerns. The total time spent in the appointment was 15 minutes encounter with patients including review of chart and various tests results, discussions about plan of care and coordination of care plan   Heath Lark, MD 12/19/2019 10:07 AM  INTERVAL HISTORY: Please see below for problem oriented charting. He returns for further follow-up He has mild persistent dry mouth No recent dental issues No recent oral lesions He has not seen ENT surgeon since 2019 He denies smoking or drinking No recent dysphagia He sees his dentist on a regular basis He is getting TSH monitoring through his primary care doctor  SUMMARY OF ONCOLOGIC HISTORY: Oncology History  Tonsil cancer ([place])  02/07/2016 Imaging   Ct neck showed abnormal soft tissue within the left pharynx, extending from the left tonsillar pillar inferiorly to the level of the hyoid and filling the left vallecula. The appearance is most consistent with a neoplastic process. 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 Histologic sampling and direct visualization should be considered. Confluent adenopathy  within the left cervical chain with areas of central hypoattenuation suggesting degrees of necrosis. This is most suggestive of metastatic spread. The areas of necrosis within the adenopathy combined with the above-described pharyngeal soft tissue mass are together suggestive of squamous cell carcinoma.   02/14/2016 Procedure   He has FNA of left neck LN    02/14/2016 Pathology Results   [id number] cytology confirmed squamous cell carcinoma   02/26/2016 PET scan   Hypermetabolic left tonsillar in pharyngeal mass with hypermetabolic left sided station IA, station III, and station Ib adenopathy and hypermetabolic station IIa adenopathy on the right. Borderline hypermetabolic small right station IIb lymph node. Several tiny lung nodules are observed, these are not hypermetabolic but are also below the threshold level for accurate characterization. Dedicated CT chest may be warranted to act as a baseline.   03/20/2016 Procedure   Placement of a right internal jugular approach power injectable Port-A-Cath. Fluoroscopic insertion of a 20-French pull-through gastrostomy tube.    03/24/2016 - 05/06/2016 Chemotherapy   He received high dose cisplatin; dose 2 and 3 with dose reduction due to side-effects   03/24/2016 - 05/15/2016 Radiation Therapy   Site/dose:  Left Tonsil and Bilateral Neck / 70 Gy in 35 fractions to gross disease, 63 Gy in 35 fractions to high risk nodal echelons, and 56 Gy in 35 fractions to intermediate risk nodal echelons. Beams/energy:   Helical IMRT / 6 MV photons   09/04/2016 Imaging   1. Significant response to therapy of left oropharyngeal/palatine tonsil primary. Low-level residual hypermetabolism could be treatment related or represent residual disease. 2. Resolution of cervical adenopathy. Low-level hypermetabolism within only 1 level 2 node remains. 3. No evidence  of hypermetabolic extra cervical disease. 4. Age advanced coronary artery atherosclerosis. Recommend assessment of  coronary risk factors and consideration of medical therapy. 5. Progressive sinus disease. 6. Bilateral pulmonary nodules. A right lower lobe nodule may be new. Below PET resolution. Recommend attention on follow-up.    11/24/2016 Imaging   1. Post radiation changes in the neck. Left tonsillar mass and malignant lymphadenopathy appear resolved by CT, with residual 7-8 mm short axis bilateral level 2 lymph nodes, rim calcified on the left. 2. Interval bilateral maxilla wisdom tooth extractions with new inflammation in the right maxillary sinus which appears related to the tooth extraction site. Recommend ENT consultation. 3. Chest CT today is reported separately.   11/24/2016 Imaging   1. 3 mm posterior right lower lobe pulmonary nodule seen on the previous PET-CT has resolved completely in the interval. With 3 mm nodule in the posterior left lower lobe is unchanged. Continued attention on follow-up recommended. 2. No new or progressive findings on today's exam   01/30/2017 Procedure   Removal of percutaneous gastrostomy tube and port-a-cath.   11/23/2017 Imaging   Negative for recurrent tumor or adenopathy in the neck.  Improvement in post radiation changes involving the anterior neck.  Near complete opacification right maxillary sinus. This is likely odontogenic due to bony defect from prior third molar extraction.   11/23/2017 Imaging   1. Stable 3 mm posterior left lower lobe lung nodule is unchanged from previous exam. Favor benign abnormality. No new pulmonary nodules or evidence of metastatic disease identified. 2. Lad coronary artery atherosclerotic calcifications.     REVIEW OF SYSTEMS:   Constitutional: Denies fevers, chills or abnormal weight loss Eyes: Denies blurriness of vision Ears, nose, mouth, throat, and face: Denies mucositis or sore throat Respiratory: Denies cough, dyspnea or wheezes Cardiovascular: Denies palpitation, chest discomfort or lower extremity  swelling Gastrointestinal:  Denies nausea, heartburn or change in bowel habits Skin: Denies abnormal skin rashes Lymphatics: Denies new lymphadenopathy or easy bruising Neurological:Denies numbness, tingling or new weaknesses Behavioral/Psych: Mood is stable, no new changes  All other systems were reviewed with the patient and are negative.  I have reviewed the past medical history, past surgical history, social history and family history with the patient and they are unchanged from previous note.  ALLERGIES:  is allergic to phenergan [promethazine hcl].  MEDICATIONS:  Current Outpatient Medications  Medication Sig Dispense Refill  . amLODipine (NORVASC) 10 MG tablet Take 1 tablet (10 mg total) by mouth daily. 30 tablet 1  . fenofibrate (TRICOR) 145 MG tablet Take 145 mg by mouth daily.     [name] levothyroxine (SYNTHROID, LEVOTHROID) 50 MCG tablet Take 75 mcg by mouth daily before breakfast.    . sodium fluoride (FLUORISHIELD) 1.1 % GEL dental gel Instill one drop of gel per tooth space of fluoride tray. Place over teeth for 5 minutes. Remove. Spit out excess. Repeat nightly. 120 mL prn   No current facility-administered medications for this visit.    PHYSICAL EXAMINATION: ECOG PERFORMANCE STATUS: 0 - Asymptomatic  Vitals:   12/19/19 0954  BP: 132/82  Pulse: 62  Resp: 18  Temp: 98.1 F (36.7 C)  SpO2: 98%   Filed Weights   12/19/19 0954  Weight: 185 lb 9.6 oz (84.2 kg)    GENERAL:alert, no distress and comfortable SKIN: skin color, texture, turgor are normal, no rashes or significant lesions EYES: normal, Conjunctiva are pink and non-injected, sclera clear OROPHARYNX:no exudate, no erythema and lips, buccal mucosa normal Neck: Neck  is thick with fibrosis from prior radiation LYMPH:  no palpable lymphadenopathy in the cervical, axillary or inguinal LUNGS: clear to auscultation and percussion with normal breathing effort HEART: regular rate & rhythm and no murmurs and no  lower extremity edema ABDOMEN:abdomen soft, non-tender and normal bowel sounds Musculoskeletal:no cyanosis of digits and no clubbing  NEURO: alert & oriented x 3 with fluent speech, no focal motor/sensory deficits  LABORATORY DATA:  I have reviewed the data as listed    Component Value Date/Time   NA 138 11/23/2017 1126   NA 140 02/16/2017 0841   K 4.2 11/23/2017 1126   K 4.1 02/16/2017 0841   CL 101 11/23/2017 1126   CO2 29 11/23/2017 1126   CO2 29 02/16/2017 0841   GLUCOSE 97 11/23/2017 1126   GLUCOSE 94 02/16/2017 0841   BUN 29 (H) 11/23/2017 1126   BUN 19.8 02/16/2017 0841   CREATININE 1.45 (H) 11/23/2017 1126   CREATININE 1.3 02/16/2017 0841   CALCIUM 10.3 11/23/2017 1126   CALCIUM 10.1 02/16/2017 0841   PROT 7.2 11/23/2017 1126   PROT 6.8 02/16/2017 0841   ALBUMIN 4.5 11/23/2017 1126   ALBUMIN 4.1 02/16/2017 0841   AST 26 11/23/2017 1126   AST 19 02/16/2017 0841   ALT 21 11/23/2017 1126   ALT 15 02/16/2017 0841   ALKPHOS 43 11/23/2017 1126   ALKPHOS 32 (L) 02/16/2017 0841   BILITOT 0.4 11/23/2017 1126   BILITOT 0.41 02/16/2017 0841   GFRNONAA 52 (L) 11/23/2017 1126   GFRAA >60 11/23/2017 1126    No results found for: SPEP, UPEP  Lab Results  Component Value Date   WBC 4.2 11/23/2017   NEUTROABS 3.0 11/23/2017   HGB 12.6 (L) 11/23/2017   HCT 36.6 (L) 11/23/2017   MCV 87.4 11/23/2017   PLT 263 11/23/2017      Chemistry      Component Value Date/Time   NA 138 11/23/2017 1126   NA 140 02/16/2017 0841   K 4.2 11/23/2017 1126   K 4.1 02/16/2017 0841   CL 101 11/23/2017 1126   CO2 29 11/23/2017 1126   CO2 29 02/16/2017 0841   BUN 29 (H) 11/23/2017 1126   BUN 19.8 02/16/2017 0841   CREATININE 1.45 (H) 11/23/2017 1126   CREATININE 1.3 02/16/2017 0841      Component Value Date/Time   CALCIUM 10.3 11/23/2017 1126   CALCIUM 10.1 02/16/2017 0841   ALKPHOS 43 11/23/2017 1126   ALKPHOS 32 (L) 02/16/2017 0841   AST 26 11/23/2017 1126   AST 19 02/16/2017  0841   ALT 21 11/23/2017 1126   ALT 15 02/16/2017 0841   BILITOT 0.4 11/23/2017 1126   BILITOT 0.41 02/16/2017 0841

## 2019-12-19 NOTE — Assessment & Plan Note (Signed)
Clinically, he has no signs of cancer recurrence. We discussed risk factor and lifestyle modification by avoiding alcohol intake or tobacco abuse He will continue close follow-up with ENT physician. I plan to see him back in a year for further follow-up

## 2019-12-19 NOTE — Telephone Encounter (Signed)
Scheduled appts per 8/9 sch msg. Gave pt a print out of AVS. 

## 2020-01-23 DIAGNOSIS — C109 Malignant neoplasm of oropharynx, unspecified: Secondary | ICD-10-CM | POA: Diagnosis not present

## 2020-01-23 DIAGNOSIS — J019 Acute sinusitis, unspecified: Secondary | ICD-10-CM | POA: Diagnosis not present

## 2020-06-04 DIAGNOSIS — I129 Hypertensive chronic kidney disease with stage 1 through stage 4 chronic kidney disease, or unspecified chronic kidney disease: Secondary | ICD-10-CM | POA: Diagnosis not present

## 2020-06-04 DIAGNOSIS — E039 Hypothyroidism, unspecified: Secondary | ICD-10-CM | POA: Diagnosis not present

## 2020-06-04 DIAGNOSIS — R739 Hyperglycemia, unspecified: Secondary | ICD-10-CM | POA: Diagnosis not present

## 2020-06-04 DIAGNOSIS — R7301 Impaired fasting glucose: Secondary | ICD-10-CM | POA: Diagnosis not present

## 2020-07-03 DIAGNOSIS — C109 Malignant neoplasm of oropharynx, unspecified: Secondary | ICD-10-CM | POA: Diagnosis not present

## 2020-09-24 DIAGNOSIS — U071 COVID-19: Secondary | ICD-10-CM | POA: Diagnosis not present

## 2020-09-24 DIAGNOSIS — R059 Cough, unspecified: Secondary | ICD-10-CM | POA: Diagnosis not present

## 2020-10-24 DIAGNOSIS — Z125 Encounter for screening for malignant neoplasm of prostate: Secondary | ICD-10-CM | POA: Diagnosis not present

## 2020-10-24 DIAGNOSIS — E7800 Pure hypercholesterolemia, unspecified: Secondary | ICD-10-CM | POA: Diagnosis not present

## 2020-10-24 DIAGNOSIS — E039 Hypothyroidism, unspecified: Secondary | ICD-10-CM | POA: Diagnosis not present

## 2020-10-24 DIAGNOSIS — R7301 Impaired fasting glucose: Secondary | ICD-10-CM | POA: Diagnosis not present

## 2020-11-05 DIAGNOSIS — Z0000 Encounter for general adult medical examination without abnormal findings: Secondary | ICD-10-CM | POA: Diagnosis not present

## 2020-11-05 DIAGNOSIS — I129 Hypertensive chronic kidney disease with stage 1 through stage 4 chronic kidney disease, or unspecified chronic kidney disease: Secondary | ICD-10-CM | POA: Diagnosis not present

## 2020-11-05 DIAGNOSIS — Z1331 Encounter for screening for depression: Secondary | ICD-10-CM | POA: Diagnosis not present

## 2020-11-05 DIAGNOSIS — R82998 Other abnormal findings in urine: Secondary | ICD-10-CM | POA: Diagnosis not present

## 2020-11-05 DIAGNOSIS — Z1212 Encounter for screening for malignant neoplasm of rectum: Secondary | ICD-10-CM | POA: Diagnosis not present

## 2020-11-05 DIAGNOSIS — Z1339 Encounter for screening examination for other mental health and behavioral disorders: Secondary | ICD-10-CM | POA: Diagnosis not present

## 2020-11-26 ENCOUNTER — Other Ambulatory Visit: Payer: Self-pay

## 2020-11-26 ENCOUNTER — Encounter: Payer: Self-pay | Admitting: Hematology and Oncology

## 2020-11-26 ENCOUNTER — Inpatient Hospital Stay: Payer: BC Managed Care – PPO | Attending: Hematology and Oncology | Admitting: Hematology and Oncology

## 2020-11-26 DIAGNOSIS — Z8589 Personal history of malignant neoplasm of other organs and systems: Secondary | ICD-10-CM | POA: Insufficient documentation

## 2020-11-26 DIAGNOSIS — C099 Malignant neoplasm of tonsil, unspecified: Secondary | ICD-10-CM

## 2020-11-26 DIAGNOSIS — E039 Hypothyroidism, unspecified: Secondary | ICD-10-CM | POA: Insufficient documentation

## 2020-11-26 DIAGNOSIS — I890 Lymphedema, not elsewhere classified: Secondary | ICD-10-CM | POA: Insufficient documentation

## 2020-11-26 NOTE — Progress Notes (Signed)
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 We discussed risk factor and lifestyle modification by avoiding alcohol intake or tobacco abuse He will continue close follow-up with ENT physician. 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   All questions were answered. The patient knows to call the clinic with any problems, questions or concerns. The total time spent in the appointment was 20 minutes encounter with patients including review of chart and various tests results, discussions about plan of care and coordination of care plan   Heath Lark, MD 11/26/2020 10:35 AM  INTERVAL HISTORY: Please see below for problem oriented charting. Returns with his wife for further follow-up He contracted COVID-19 infection in May but fully recovered He has more taste sensation since then He continues to have persistent dry mouth He sees his dentist frequently No recent alcohol intake or tobacco use No new lesions in his oropharynx or neck He follows closely with his primary care doctor for medication  adjustment for his Synthroid  SUMMARY OF ONCOLOGIC HISTORY: Oncology History  Tonsil cancer ([place])  02/07/2016 Imaging   Ct neck showed abnormal soft tissue within the left pharynx, extending from the left tonsillar pillar inferiorly to the level of the hyoid and filling the left vallecula. The appearance is most consistent with a neoplastic process. 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 Histologic sampling and direct visualization should be considered. Confluent adenopathy within the left cervical chain with areas of central hypoattenuation suggesting degrees of necrosis. This is most suggestive of metastatic spread. The areas of necrosis within the adenopathy combined with the above-described pharyngeal soft tissue mass are together suggestive of squamous cell carcinoma.    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 Borderline hypermetabolic small right station IIb lymph node. Several tiny lung nodules are observed, these are not hypermetabolic but are also below the threshold level for accurate characterization. Dedicated CT chest may be warranted to act as a baseline.    03/20/2016 Procedure   Placement of a right internal jugular approach power injectable Port-A-Cath. Fluoroscopic insertion of a 20-French pull-through gastrostomy tube.    03/24/2016 - 05/06/2016 Chemotherapy   He received high dose cisplatin; dose 2 and 3 with dose reduction due to side-effects    03/24/2016 - 05/15/2016 Radiation Therapy   Site/dose:  Left Tonsil and Bilateral Neck / 70 Gy in 35  fractions to gross disease, 63 Gy in 35 fractions to high risk nodal echelons, and 56 Gy in 35  fractions to intermediate risk nodal echelons.  Beams/energy:   Helical IMRT / 6 MV photons    09/04/2016 Imaging   1. Significant response to therapy of left oropharyngeal/palatine tonsil primary. Low-level residual hypermetabolism could be treatment related or represent residual disease. 2. Resolution of cervical adenopathy. Low-level hypermetabolism within only 1 level 2 node remains. 3. No evidence of hypermetabolic extra cervical disease. 4. Age advanced coronary artery atherosclerosis. Recommend assessment of coronary risk factors and consideration of medical therapy. 5. Progressive sinus disease. 6. Bilateral pulmonary nodules. A right lower lobe nodule may be new. Below PET resolution. Recommend attention on follow-up.     11/24/2016 Imaging   1. Post radiation changes in the neck. Left tonsillar mass and malignant lymphadenopathy appear resolved by CT, with residual 7-8 mm short axis bilateral level 2 lymph nodes, rim calcified on the left. 2. Interval bilateral maxilla wisdom tooth extractions with new inflammation in the right maxillary sinus which appears related to the tooth extraction site. Recommend ENT consultation. 3. Chest CT today is reported separately.    11/24/2016 Imaging   1. 3 mm posterior right lower lobe pulmonary nodule seen on the previous PET-CT has resolved completely in the interval. With 3 mm nodule in the posterior left lower lobe is unchanged. Continued attention on follow-up recommended. 2. No new or progressive findings on today's exam    01/30/2017 Procedure   Removal of percutaneous gastrostomy tube and port-a-cath.    11/23/2017 Imaging   Negative for recurrent tumor or adenopathy in the neck.  Improvement in post radiation changes involving the anterior neck.  Near complete opacification right maxillary sinus. This is likely odontogenic due to bony defect from prior third molar extraction.    11/23/2017 Imaging   1. Stable 3 mm posterior left lower  lobe lung nodule is unchanged from previous exam. Favor benign abnormality. No new pulmonary nodules or evidence of metastatic disease identified. 2. Lad coronary artery atherosclerotic calcifications.      REVIEW OF SYSTEMS:   Constitutional: Denies fevers, chills or abnormal weight loss Eyes: Denies blurriness of vision Ears, nose, mouth, throat, and face: Denies mucositis or sore throat Respiratory: Denies cough, dyspnea or wheezes Cardiovascular: Denies palpitation, chest discomfort or lower extremity swelling Gastrointestinal:  Denies nausea, heartburn or change in bowel habits Skin: Denies abnormal skin rashes Lymphatics: Denies new lymphadenopathy or easy bruising Neurological:Denies numbness, tingling or new weaknesses Behavioral/Psych: Mood is stable, no new changes  All other systems were reviewed with the patient and are negative.  I have reviewed the past medical history, past surgical history, social history and family history with the patient and they are unchanged from previous note.  ALLERGIES:  is allergic to phenergan [promethazine hcl].  MEDICATIONS:  Current Outpatient Medications  Medication Sig Dispense Refill   amLODipine (NORVASC) 10 MG tablet Take 1 tablet (10 mg total) by mouth daily. 30 tablet 1   fenofibrate (TRICOR) 145 MG tablet Take 145 mg by mouth daily.      levothyroxine (SYNTHROID, LEVOTHROID) 50 MCG tablet Take 75 mcg by mouth daily before breakfast.     sodium fluoride (FLUORISHIELD) 1.1 % GEL dental gel Instill one drop of gel per tooth space of fluoride tray. Place over teeth for 5 minutes. Remove. Spit out excess. Repeat nightly. 120 mL prn   No current facility-administered medications for this visit.    PHYSICAL EXAMINATION:  ECOG PERFORMANCE STATUS: 1 - Symptomatic but completely ambulatory  Vitals:   11/26/20 0953  BP: 123/70  Pulse: 64  Resp: 18  SpO2: 98%   Filed Weights   11/26/20 0953  Weight: 189 lb 6.4 oz (85.9 kg)     GENERAL:alert, no distress and comfortable SKIN: skin color, texture, turgor are normal, no rashes or significant lesions EYES: normal, Conjunctiva are pink and non-injected, sclera clear OROPHARYNX:no exudate, no erythema and lips, buccal mucosa, and tongue normal  NECK: Fibrotic changes noted in his neck.  No other palpable abnormalities LYMPH:  no palpable lymphadenopathy in the cervical, axillary or inguinal LUNGS: clear to auscultation and percussion with normal breathing effort HEART: regular rate & rhythm and no murmurs and no lower extremity edema ABDOMEN:abdomen soft, non-tender and normal bowel sounds Musculoskeletal:no cyanosis of digits and no clubbing  NEURO: alert & oriented x 3 with fluent speech, no focal motor/sensory deficits  LABORATORY DATA:  I have reviewed the data as listed    Component Value Date/Time   NA 138 11/23/2017 1126   NA 140 02/16/2017 0841   K 4.2 11/23/2017 1126   K 4.1 02/16/2017 0841   CL 101 11/23/2017 1126   CO2 29 11/23/2017 1126   CO2 29 02/16/2017 0841   GLUCOSE 97 11/23/2017 1126   GLUCOSE 94 02/16/2017 0841   BUN 29 (H) 11/23/2017 1126   BUN 19.8 02/16/2017 0841   CREATININE 1.45 (H) 11/23/2017 1126   CREATININE 1.3 02/16/2017 0841   CALCIUM 10.3 11/23/2017 1126   CALCIUM 10.1 02/16/2017 0841   PROT 7.2 11/23/2017 1126   PROT 6.8 02/16/2017 0841   ALBUMIN 4.5 11/23/2017 1126   ALBUMIN 4.1 02/16/2017 0841   AST 26 11/23/2017 1126   AST 19 02/16/2017 0841   ALT 21 11/23/2017 1126   ALT 15 02/16/2017 0841   ALKPHOS 43 11/23/2017 1126   ALKPHOS 32 (L) 02/16/2017 0841   BILITOT 0.4 11/23/2017 1126   BILITOT 0.41 02/16/2017 0841   GFRNONAA 52 (L) 11/23/2017 1126   GFRAA >60 11/23/2017 1126    No results found for: SPEP, UPEP  Lab Results  Component Value Date   WBC 4.2 11/23/2017   NEUTROABS 3.0 11/23/2017   HGB 12.6 (L) 11/23/2017   HCT 36.6 (L) 11/23/2017   MCV 87.4 11/23/2017   PLT 263 11/23/2017      Chemistry       Component Value Date/Time   NA 138 11/23/2017 1126   NA 140 02/16/2017 0841   K 4.2 11/23/2017 1126   K 4.1 02/16/2017 0841   CL 101 11/23/2017 1126   CO2 29 11/23/2017 1126   CO2 29 02/16/2017 0841   BUN 29 (H) 11/23/2017 1126   BUN 19.8 02/16/2017 0841   CREATININE 1.45 (H) 11/23/2017 1126   CREATININE 1.3 02/16/2017 0841      Component Value Date/Time   CALCIUM 10.3 11/23/2017 1126   CALCIUM 10.1 02/16/2017 0841   ALKPHOS 43 11/23/2017 1126   ALKPHOS 32 (L) 02/16/2017 0841   AST 26 11/23/2017 1126   AST 19 02/16/2017 0841   ALT 21 11/23/2017 1126   ALT 15 02/16/2017 0841   BILITOT 0.4 11/23/2017 1126   BILITOT 0.41 02/16/2017 0841

## 2020-11-26 NOTE — Assessment & Plan Note (Signed)
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

## 2020-11-26 NOTE — Assessment & Plan Note (Signed)
He has limited range of motion on his neck and fibrotic changes in his neck We discussed importance of neck exercise on a regular basis to prevent further limitation of range of motion of his neck

## 2020-11-26 NOTE — Assessment & Plan Note (Signed)
Clinically, he has no signs of cancer recurrence. We discussed risk factor and lifestyle modification by avoiding alcohol intake or tobacco abuse He will continue close follow-up with ENT physician. He is considered a long-term cancer survivor and does not need to return

## 2020-12-06 DIAGNOSIS — E039 Hypothyroidism, unspecified: Secondary | ICD-10-CM | POA: Diagnosis not present

## 2021-01-22 DIAGNOSIS — E039 Hypothyroidism, unspecified: Secondary | ICD-10-CM | POA: Diagnosis not present

## 2021-03-13 ENCOUNTER — Other Ambulatory Visit: Payer: Self-pay | Admitting: Adult Health

## 2021-03-13 ENCOUNTER — Ambulatory Visit
Admission: RE | Admit: 2021-03-13 | Discharge: 2021-03-13 | Disposition: A | Discharge status: Home / Self Care | Payer: BC Managed Care – PPO | Source: Ambulatory Visit | Attending: Adult Health | Admitting: Adult Health

## 2021-03-13 ENCOUNTER — Other Ambulatory Visit: Payer: BC Managed Care – PPO

## 2021-03-13 DIAGNOSIS — R911 Solitary pulmonary nodule: Secondary | ICD-10-CM

## 2021-03-13 DIAGNOSIS — M4802 Spinal stenosis, cervical region: Secondary | ICD-10-CM | POA: Diagnosis not present

## 2021-03-13 DIAGNOSIS — R591 Generalized enlarged lymph nodes: Secondary | ICD-10-CM

## 2021-03-13 DIAGNOSIS — R590 Localized enlarged lymph nodes: Secondary | ICD-10-CM | POA: Diagnosis not present

## 2021-03-13 DIAGNOSIS — E039 Hypothyroidism, unspecified: Secondary | ICD-10-CM | POA: Diagnosis not present

## 2021-03-13 DIAGNOSIS — I2510 Atherosclerotic heart disease of native coronary artery without angina pectoris: Secondary | ICD-10-CM | POA: Diagnosis not present

## 2021-03-13 DIAGNOSIS — Z85819 Personal history of malignant neoplasm of unspecified site of lip, oral cavity, and pharynx: Secondary | ICD-10-CM | POA: Diagnosis not present

## 2021-03-13 DIAGNOSIS — C099 Malignant neoplasm of tonsil, unspecified: Secondary | ICD-10-CM | POA: Diagnosis not present

## 2021-03-13 DIAGNOSIS — J701 Chronic and other pulmonary manifestations due to radiation: Secondary | ICD-10-CM | POA: Diagnosis not present

## 2021-03-13 DIAGNOSIS — M47812 Spondylosis without myelopathy or radiculopathy, cervical region: Secondary | ICD-10-CM | POA: Diagnosis not present

## 2021-03-13 DIAGNOSIS — Z85818 Personal history of malignant neoplasm of other sites of lip, oral cavity, and pharynx: Secondary | ICD-10-CM | POA: Diagnosis not present

## 2021-03-13 MED ORDER — IOPAMIDOL (ISOVUE-300) INJECTION 61%
100.0000 mL | Freq: Once | INTRAVENOUS | Status: AC | PRN
  Administered 2021-03-13: 100 mL via INTRAVENOUS

## 2021-03-18 DIAGNOSIS — Z20828 Contact with and (suspected) exposure to other viral communicable diseases: Secondary | ICD-10-CM | POA: Diagnosis not present

## 2021-03-18 DIAGNOSIS — R051 Acute cough: Secondary | ICD-10-CM | POA: Diagnosis not present

## 2021-03-18 DIAGNOSIS — R5383 Other fatigue: Secondary | ICD-10-CM | POA: Diagnosis not present

## 2021-03-18 DIAGNOSIS — B349 Viral infection, unspecified: Secondary | ICD-10-CM | POA: Diagnosis not present

## 2021-05-27 DIAGNOSIS — R7301 Impaired fasting glucose: Secondary | ICD-10-CM | POA: Diagnosis not present

## 2021-05-27 DIAGNOSIS — N1831 Chronic kidney disease, stage 3a: Secondary | ICD-10-CM | POA: Diagnosis not present

## 2021-05-27 DIAGNOSIS — E039 Hypothyroidism, unspecified: Secondary | ICD-10-CM | POA: Diagnosis not present

## 2021-05-27 DIAGNOSIS — I129 Hypertensive chronic kidney disease with stage 1 through stage 4 chronic kidney disease, or unspecified chronic kidney disease: Secondary | ICD-10-CM | POA: Diagnosis not present

## 2021-06-22 DIAGNOSIS — J0100 Acute maxillary sinusitis, unspecified: Secondary | ICD-10-CM | POA: Diagnosis not present

## 2021-06-22 DIAGNOSIS — J209 Acute bronchitis, unspecified: Secondary | ICD-10-CM | POA: Diagnosis not present

## 2021-07-02 DIAGNOSIS — C109 Malignant neoplasm of oropharynx, unspecified: Secondary | ICD-10-CM | POA: Diagnosis not present

## 2021-07-02 DIAGNOSIS — J329 Chronic sinusitis, unspecified: Secondary | ICD-10-CM | POA: Diagnosis not present

## 2021-07-18 DIAGNOSIS — R062 Wheezing: Secondary | ICD-10-CM | POA: Diagnosis not present

## 2021-07-18 DIAGNOSIS — R058 Other specified cough: Secondary | ICD-10-CM | POA: Diagnosis not present

## 2021-07-18 DIAGNOSIS — J189 Pneumonia, unspecified organism: Secondary | ICD-10-CM | POA: Diagnosis not present

## 2021-12-30 DIAGNOSIS — I129 Hypertensive chronic kidney disease with stage 1 through stage 4 chronic kidney disease, or unspecified chronic kidney disease: Secondary | ICD-10-CM | POA: Diagnosis not present

## 2021-12-30 DIAGNOSIS — K148 Other diseases of tongue: Secondary | ICD-10-CM | POA: Diagnosis not present

## 2021-12-30 DIAGNOSIS — M79672 Pain in left foot: Secondary | ICD-10-CM | POA: Diagnosis not present

## 2022-01-17 DIAGNOSIS — E7800 Pure hypercholesterolemia, unspecified: Secondary | ICD-10-CM | POA: Diagnosis not present

## 2022-01-17 DIAGNOSIS — Z125 Encounter for screening for malignant neoplasm of prostate: Secondary | ICD-10-CM | POA: Diagnosis not present

## 2022-01-17 DIAGNOSIS — E039 Hypothyroidism, unspecified: Secondary | ICD-10-CM | POA: Diagnosis not present

## 2022-01-17 DIAGNOSIS — R7301 Impaired fasting glucose: Secondary | ICD-10-CM | POA: Diagnosis not present

## 2022-01-24 DIAGNOSIS — G319 Degenerative disease of nervous system, unspecified: Secondary | ICD-10-CM | POA: Diagnosis not present

## 2022-01-24 DIAGNOSIS — R29810 Facial weakness: Secondary | ICD-10-CM | POA: Diagnosis not present

## 2022-01-24 DIAGNOSIS — M47812 Spondylosis without myelopathy or radiculopathy, cervical region: Secondary | ICD-10-CM | POA: Diagnosis not present

## 2022-01-24 DIAGNOSIS — K148 Other diseases of tongue: Secondary | ICD-10-CM | POA: Diagnosis not present

## 2022-01-24 DIAGNOSIS — R531 Weakness: Secondary | ICD-10-CM | POA: Diagnosis not present

## 2022-01-27 DIAGNOSIS — Z1339 Encounter for screening examination for other mental health and behavioral disorders: Secondary | ICD-10-CM | POA: Diagnosis not present

## 2022-01-27 DIAGNOSIS — R82998 Other abnormal findings in urine: Secondary | ICD-10-CM | POA: Diagnosis not present

## 2022-01-27 DIAGNOSIS — Z1331 Encounter for screening for depression: Secondary | ICD-10-CM | POA: Diagnosis not present

## 2022-01-27 DIAGNOSIS — Z0000 Encounter for general adult medical examination without abnormal findings: Secondary | ICD-10-CM | POA: Diagnosis not present

## 2022-01-27 DIAGNOSIS — I129 Hypertensive chronic kidney disease with stage 1 through stage 4 chronic kidney disease, or unspecified chronic kidney disease: Secondary | ICD-10-CM | POA: Diagnosis not present

## 2022-02-21 ENCOUNTER — Ambulatory Visit: Payer: Self-pay | Admitting: Surgery

## 2022-02-21 DIAGNOSIS — M5412 Radiculopathy, cervical region: Secondary | ICD-10-CM | POA: Diagnosis not present

## 2022-02-21 DIAGNOSIS — Y842 Radiological procedure and radiotherapy as the cause of abnormal reaction of the patient, or of later complication, without mention of misadventure at the time of the procedure: Secondary | ICD-10-CM | POA: Diagnosis not present

## 2022-02-21 DIAGNOSIS — R252 Cramp and spasm: Secondary | ICD-10-CM | POA: Diagnosis not present

## 2022-02-21 DIAGNOSIS — G9782 Other postprocedural complications and disorders of nervous system: Secondary | ICD-10-CM | POA: Diagnosis not present

## 2022-02-21 DIAGNOSIS — K4090 Unilateral inguinal hernia, without obstruction or gangrene, not specified as recurrent: Secondary | ICD-10-CM | POA: Diagnosis not present

## 2022-02-21 NOTE — H&P (Signed)
Todd [name] [id number]    Referring Provider:  Tisovec, Richard, MD     Subjective    Chief Complaint: New Consultation (eval of L ing hernia)       History of Present Illness: 61-year-old male with history of hypertension, hyperlipidemia, tonsillar cancer status post radiation, arthritis, previous G-tube presents for evaluation of a left inguinal hernia.  This was noted on his yearly physical last month, but he actually noticed it 7 or 8 months ago.  It does not bother him, and he does not think it has gotten any larger since he first noticed it.  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  Only previous abdominal surgery is the G-tube.     Review of Systems: A complete review of systems was obtained from the patient.  I have reviewed this information and discussed as appropriate with the patient.  See HPI as well for other ROS.     Medical History:     Past Medical History:  Diagnosis Date   Arthritis     History of cancer     Hypertension     Thyroid disease        There is no problem list on file for this patient.          Past Surgical History:  Procedure Laterality Date   stomach tube        and port for cancer treatments           Allergies  Allergen Reactions   Promethazine Other (See Comments)      Got really nervous and shaky..            Current Outpatient Medications on File Prior to Visit  Medication Sig Dispense Refill   acetaminophen (TYLENOL) 650 MG ER tablet Take by mouth       amLODIPine (NORVASC) 10 MG tablet         ASCORBIC ACID, VITAMIN C, ORAL Take by mouth       aspirin 81 MG EC tablet Take by mouth       fenofibrate nanocrystallized (TRICOR) 145 MG tablet         fexofenadine HCl (FEXOFENADINE ORAL) Take by mouth       glucosamine-chondroit-vit C-Mn 500-400 mg Cap Take by mouth       levothyroxine (SYNTHROID) 125 MCG tablet         multivitamin capsule  Take 1 capsule by mouth once daily       ubidecarenone (CO Q-10 ORAL) Take by mouth        No current facility-administered medications on file prior to visit.           Family History  Problem Relation Age of Onset   Diabetes Mother     High blood pressure (Hypertension) Father     Diabetes Father        Social History       Tobacco Use  Smoking Status Never  Smokeless Tobacco Never      Social History        Socioeconomic History   Marital status: Married  Tobacco Use   Smoking status: Never   Smokeless tobacco: Never  Substance and Sexual Activity   Alcohol use: Never   Drug use: Never      Objective:         Vitals:    02/21/22 1502  BP: 122/82  Pulse: 67    SpO2: 95%  Weight: 81.2 kg (179 lb)  Height: 172.7 cm (5' 8")    Body mass index is 27.22 kg/m.   Alert and well-appearing Unlabored respirations Abdomen soft and nontender.  Reducible left inguinal hernia.  Laxity on the right but no overt hernia Assessment and Plan:  Diagnoses and all orders for this visit:   Non-recurrent unilateral inguinal hernia without obstruction or gangrene     We discussed options going forward including ongoing observation, open and minimally invasive repair and we went over the merits and trade-offs of each of these.  I recommend an open repair for this unilateral nonrecurrent hernia and I went over the surgical technique with him in detail and discussed risks of bleeding, infection, pain, scarring, injury to intra-abdominal or retroperitoneal structures, chronic groin pain, hernia recurrence, as well as systemic cardiovascular/pulmonary/thromboembolic complications.  Questions welcomed and answered.  Plan to schedule surgery at their convenience.   Betrice Wanat AMANDA Jourdyn Ferrin, MD    

## 2022-03-11 DIAGNOSIS — R252 Cramp and spasm: Secondary | ICD-10-CM | POA: Diagnosis not present

## 2022-03-11 DIAGNOSIS — G5602 Carpal tunnel syndrome, left upper limb: Secondary | ICD-10-CM | POA: Diagnosis not present

## 2022-03-11 DIAGNOSIS — G9782 Other postprocedural complications and disorders of nervous system: Secondary | ICD-10-CM | POA: Diagnosis not present

## 2022-03-11 DIAGNOSIS — Y842 Radiological procedure and radiotherapy as the cause of abnormal reaction of the patient, or of later complication, without mention of misadventure at the time of the procedure: Secondary | ICD-10-CM | POA: Diagnosis not present

## 2022-03-18 DIAGNOSIS — Y842 Radiological procedure and radiotherapy as the cause of abnormal reaction of the patient, or of later complication, without mention of misadventure at the time of the procedure: Secondary | ICD-10-CM | POA: Diagnosis not present

## 2022-03-18 DIAGNOSIS — R1313 Dysphagia, pharyngeal phase: Secondary | ICD-10-CM | POA: Diagnosis not present

## 2022-03-18 DIAGNOSIS — G9782 Other postprocedural complications and disorders of nervous system: Secondary | ICD-10-CM | POA: Diagnosis not present

## 2022-03-18 DIAGNOSIS — R1310 Dysphagia, unspecified: Secondary | ICD-10-CM | POA: Diagnosis not present

## 2022-03-18 DIAGNOSIS — R1311 Dysphagia, oral phase: Secondary | ICD-10-CM | POA: Diagnosis not present

## 2022-04-01 DIAGNOSIS — Y842 Radiological procedure and radiotherapy as the cause of abnormal reaction of the patient, or of later complication, without mention of misadventure at the time of the procedure: Secondary | ICD-10-CM | POA: Diagnosis not present

## 2022-04-01 DIAGNOSIS — R1312 Dysphagia, oropharyngeal phase: Secondary | ICD-10-CM | POA: Diagnosis not present

## 2022-04-24 DIAGNOSIS — Y842 Radiological procedure and radiotherapy as the cause of abnormal reaction of the patient, or of later complication, without mention of misadventure at the time of the procedure: Secondary | ICD-10-CM | POA: Diagnosis not present

## 2022-04-24 DIAGNOSIS — R1312 Dysphagia, oropharyngeal phase: Secondary | ICD-10-CM | POA: Diagnosis not present

## 2022-04-29 DIAGNOSIS — Y842 Radiological procedure and radiotherapy as the cause of abnormal reaction of the patient, or of later complication, without mention of misadventure at the time of the procedure: Secondary | ICD-10-CM | POA: Diagnosis not present

## 2022-04-29 DIAGNOSIS — R1312 Dysphagia, oropharyngeal phase: Secondary | ICD-10-CM | POA: Diagnosis not present

## 2022-04-30 ENCOUNTER — Encounter (HOSPITAL_BASED_OUTPATIENT_CLINIC_OR_DEPARTMENT_OTHER): Payer: Self-pay | Admitting: Surgery

## 2022-05-01 ENCOUNTER — Encounter (HOSPITAL_BASED_OUTPATIENT_CLINIC_OR_DEPARTMENT_OTHER): Payer: Self-pay | Admitting: Surgery

## 2022-05-01 NOTE — H&P (Signed)
Todd [name] [id number]    Referring Provider:  Domenick Gong, MD     Subjective    Chief Complaint: New Consultation (eval of L ing hernia)       History of Present Illness: 61 year old male with history of hypertension, hyperlipidemia, tonsillar cancer status post radiation, arthritis, previous G-tube presents for evaluation of a left inguinal hernia.  This was noted on his yearly physical last month, but he actually noticed it 7 or 8 months ago.  It does not bother him, and he does not think it has gotten any larger since he first noticed it.  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  Only previous abdominal surgery is the G-tube.     Review of Systems: A complete review of systems was obtained from the patient.  I have reviewed this information and discussed as appropriate with the patient.  See HPI as well for other ROS.     Medical History:     Past Medical History:  Diagnosis Date   Arthritis     History of cancer     Hypertension     Thyroid disease        There is no problem list on file for this patient.          Past Surgical History:  Procedure Laterality Date   stomach tube        and port for cancer treatments           Allergies  Allergen Reactions   Promethazine Other (See Comments)      Got really nervous and shaky..            Current Outpatient Medications on File Prior to Visit  Medication Sig Dispense Refill   acetaminophen (TYLENOL) 650 MG ER tablet Take by mouth       amLODIPine (NORVASC) 10 MG tablet         ASCORBIC ACID, VITAMIN C, ORAL Take by mouth       aspirin 81 MG EC tablet Take by mouth       fenofibrate nanocrystallized (TRICOR) 145 MG tablet         fexofenadine HCl (FEXOFENADINE ORAL) Take by mouth       glucosamine-chondroit-vit C-Mn 500-400 mg Cap Take by mouth       levothyroxine (SYNTHROID) 125 MCG tablet         multivitamin capsule  Take 1 capsule by mouth once daily       ubidecarenone (CO Q-10 ORAL) Take by mouth        No current facility-administered medications on file prior to visit.           Family History  Problem Relation Age of Onset   Diabetes Mother     High blood pressure (Hypertension) Father     Diabetes Father        Social History       Tobacco Use  Smoking Status Never  Smokeless Tobacco Never      Social History        Socioeconomic History   Marital status: Married  Tobacco Use   Smoking status: Never   Smokeless tobacco: Never  Substance and Sexual Activity   Alcohol use: Never   Drug use: Never      Objective:         Vitals:    02/21/22 1502  BP: 122/82  Pulse: 67  SpO2: 95%  Weight: 81.2 kg (179 lb)  Height: 172.7 cm ('5\' 8"'$ )    Body mass index is 27.22 kg/m.   Alert and well-appearing Unlabored respirations Abdomen soft and nontender.  Reducible left inguinal hernia.  Laxity on the right but no overt hernia Assessment and Plan:  Diagnoses and all orders for this visit:   Non-recurrent unilateral inguinal hernia without obstruction or gangrene     We discussed options going forward including ongoing observation, open and minimally invasive repair and we went over the merits and trade-offs of each of these.  I recommend an open repair for this unilateral nonrecurrent hernia and I went over the surgical technique with him in detail and discussed risks of bleeding, infection, pain, scarring, injury to intra-abdominal or retroperitoneal structures, chronic groin pain, hernia recurrence, as well as systemic cardiovascular/pulmonary/thromboembolic complications.  Questions welcomed and answered.  Plan to schedule surgery at their convenience.   Masiya Claassen Raquel James, MD

## 2022-05-01 NOTE — Progress Notes (Addendum)
Addendum:  Chart reviewed by anesthesia, Dr Tobias Alexander MDA, via phone.  Dr Tobias Alexander stated pt will be evaluation dos.   Spoke w/ via phone for pre-op interview--- pt Lab needs dos----  [facility], ekg             Lab results------ no COVID test -----patient states asymptomatic no test needed Arrive at ------- 0830 on 05-02-2022 NPO after MN with exception sip of water w/ meds Med rec completed Medications to take morning of surgery ----- norvasc, tricor, synthroid, allegra, pepcid Diabetic medication ----- n/a Patient instructed no nail polish to be worn day of surgery Patient instructed to bring photo id and insurance card day of surgery Patient aware to have Driver (ride ) / caregiver    for 24 hours after surgery -- wife, cindy Patient Special Instructions ----- advised pt about obtaining hibiclens from any pharmacy and shower head to toe tonight and am of surgery Pre-Op special Istructions -----  n/a Patient verbalized understanding of instructions that were given at this phone interview. Patient denies shortness of breath, chest pain, fever, cough at this phone interview.   Anesthesia Review:  HTN;  hx left tonsil cancer s/p head/ bilateral neck radiation completed 01/ 2018 and completed chemo 2017,  per pt residual tongue atrophy, hoarseness, hearing loss, intermittent left facial weakness and left arm numbness, and decreased neck ROM.  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

## 2022-05-02 ENCOUNTER — Encounter (HOSPITAL_BASED_OUTPATIENT_CLINIC_OR_DEPARTMENT_OTHER): Payer: Self-pay | Admitting: Surgery

## 2022-05-02 ENCOUNTER — Encounter (HOSPITAL_BASED_OUTPATIENT_CLINIC_OR_DEPARTMENT_OTHER)
Admission: RE | Disposition: A | Discharge status: Home / Self Care | Payer: Self-pay | Source: Home / Self Care | Attending: Surgery

## 2022-05-02 ENCOUNTER — Ambulatory Visit (HOSPITAL_BASED_OUTPATIENT_CLINIC_OR_DEPARTMENT_OTHER)
Admission: RE | Admit: 2022-05-02 | Discharge: 2022-05-02 | Disposition: A | Discharge status: Home / Self Care | Payer: BC Managed Care – PPO | Attending: Surgery | Admitting: Surgery

## 2022-05-02 ENCOUNTER — Ambulatory Visit (HOSPITAL_BASED_OUTPATIENT_CLINIC_OR_DEPARTMENT_OTHER): Payer: BC Managed Care – PPO | Admitting: Anesthesiology

## 2022-05-02 ENCOUNTER — Other Ambulatory Visit: Payer: Self-pay

## 2022-05-02 DIAGNOSIS — Z923 Personal history of irradiation: Secondary | ICD-10-CM | POA: Diagnosis not present

## 2022-05-02 DIAGNOSIS — E785 Hyperlipidemia, unspecified: Secondary | ICD-10-CM | POA: Diagnosis not present

## 2022-05-02 DIAGNOSIS — D759 Disease of blood and blood-forming organs, unspecified: Secondary | ICD-10-CM | POA: Diagnosis not present

## 2022-05-02 DIAGNOSIS — Z85818 Personal history of malignant neoplasm of other sites of lip, oral cavity, and pharynx: Secondary | ICD-10-CM | POA: Insufficient documentation

## 2022-05-02 DIAGNOSIS — D649 Anemia, unspecified: Secondary | ICD-10-CM | POA: Insufficient documentation

## 2022-05-02 DIAGNOSIS — K4090 Unilateral inguinal hernia, without obstruction or gangrene, not specified as recurrent: Secondary | ICD-10-CM | POA: Insufficient documentation

## 2022-05-02 DIAGNOSIS — E039 Hypothyroidism, unspecified: Secondary | ICD-10-CM | POA: Diagnosis not present

## 2022-05-02 DIAGNOSIS — Z01818 Encounter for other preprocedural examination: Secondary | ICD-10-CM

## 2022-05-02 DIAGNOSIS — I10 Essential (primary) hypertension: Secondary | ICD-10-CM | POA: Diagnosis not present

## 2022-05-02 HISTORY — DX: Facial weakness: R29.810

## 2022-05-02 HISTORY — DX: Gastro-esophageal reflux disease without esophagitis: K21.9

## 2022-05-02 HISTORY — DX: Dysphagia, oropharyngeal phase: R13.12

## 2022-05-02 HISTORY — PX: INGUINAL HERNIA REPAIR: SHX194

## 2022-05-02 HISTORY — DX: Frequency of micturition: R35.0

## 2022-05-02 HISTORY — DX: Anesthesia of skin: R20.2

## 2022-05-02 HISTORY — DX: Hypothyroidism, unspecified: E03.9

## 2022-05-02 HISTORY — DX: Presence of spectacles and contact lenses: Z97.3

## 2022-05-02 HISTORY — DX: Sensorineural hearing loss, bilateral: H90.3

## 2022-05-02 HISTORY — DX: Personal history of irradiation: Z92.3

## 2022-05-02 HISTORY — DX: Presence of external hearing-aid: Z97.4

## 2022-05-02 HISTORY — DX: Unilateral inguinal hernia, without obstruction or gangrene, not specified as recurrent: K40.90

## 2022-05-02 HISTORY — DX: Mixed hyperlipidemia: E78.2

## 2022-05-02 HISTORY — DX: Dysphonia: R49.0

## 2022-05-02 HISTORY — DX: Paresthesia of skin: R20.0

## 2022-05-02 HISTORY — DX: Carpal tunnel syndrome, left upper limb: G56.02

## 2022-05-02 HISTORY — DX: Other symptoms and signs involving the musculoskeletal system: R29.898

## 2022-05-02 HISTORY — DX: Muscle wasting and atrophy, not elsewhere classified, unspecified site: M62.50

## 2022-05-02 LAB — POCT I-STAT, CHEM 8
BUN: 22 mg/dL (ref 8–23)
Calcium, Ion: 1.23 mmol/L (ref 1.15–1.40)
Chloride: 101 mmol/L (ref 98–111)
Creatinine, Ser: 1.2 mg/dL (ref 0.61–1.24)
Glucose, Bld: 101 mg/dL — ABNORMAL HIGH (ref 70–99)
HCT: 41 % (ref 39.0–52.0)
Hemoglobin: 13.9 g/dL (ref 13.0–17.0)
Potassium: 4 mmol/L (ref 3.5–5.1)
Sodium: 140 mmol/L (ref 135–145)
TCO2: 26 mmol/L (ref 22–32)

## 2022-05-02 SURGERY — REPAIR, HERNIA, INGUINAL, ADULT
Anesthesia: General | Site: Groin | Laterality: Left

## 2022-05-02 MED ORDER — SODIUM CHLORIDE 0.9% FLUSH: 3.0000 mL | Freq: Two times a day (BID) | INTRAVENOUS | Status: DC

## 2022-05-02 MED ORDER — OXYCODONE HCL 5 MG PO TABS: 5.0000 mg | ORAL_TABLET | Freq: Three times a day (TID) | ORAL | 0 refills | Status: AC | PRN

## 2022-05-02 MED ORDER — CEFAZOLIN SODIUM-DEXTROSE 2-4 GM/100ML-% IV SOLN
2.0000 g | INTRAVENOUS | Status: AC
  Administered 2022-05-02: 2 g via INTRAVENOUS

## 2022-05-02 MED ORDER — OXYCODONE HCL 5 MG PO TABS: 5.0000 mg | ORAL_TABLET | ORAL | Status: DC | PRN

## 2022-05-02 MED ORDER — BUPIVACAINE LIPOSOME 1.3 % IJ SUSP
INTRAMUSCULAR | Status: DC | PRN
  Administered 2022-05-02: 30 mL

## 2022-05-02 MED ORDER — GABAPENTIN 300 MG PO CAPS
ORAL_CAPSULE | ORAL | Status: AC
  Filled 2022-05-02: qty 1

## 2022-05-02 MED ORDER — DEXAMETHASONE SODIUM PHOSPHATE 10 MG/ML IJ SOLN
INTRAMUSCULAR | Status: DC | PRN
  Administered 2022-05-02: 5 mg via INTRAVENOUS

## 2022-05-02 MED ORDER — MIDAZOLAM HCL 2 MG/2ML IJ SOLN
INTRAMUSCULAR | Status: AC
  Filled 2022-05-02: qty 2

## 2022-05-02 MED ORDER — CHLORHEXIDINE GLUCONATE 4 % EX LIQD: 60.0000 mL | Freq: Once | CUTANEOUS | Status: DC

## 2022-05-02 MED ORDER — OXYCODONE HCL 5 MG/5ML PO SOLN: 5.0000 mg | Freq: Once | ORAL | Status: DC | PRN

## 2022-05-02 MED ORDER — GABAPENTIN 300 MG PO CAPS
300.0000 mg | ORAL_CAPSULE | ORAL | Status: AC
  Administered 2022-05-02: 300 mg via ORAL

## 2022-05-02 MED ORDER — FENTANYL CITRATE (PF) 100 MCG/2ML IJ SOLN: 25.0000 ug | INTRAMUSCULAR | Status: DC | PRN

## 2022-05-02 MED ORDER — OXYCODONE HCL 5 MG PO TABS: 5.0000 mg | ORAL_TABLET | Freq: Once | ORAL | Status: DC | PRN

## 2022-05-02 MED ORDER — ACETAMINOPHEN 325 MG RE SUPP: 650.0000 mg | RECTAL | Status: DC | PRN

## 2022-05-02 MED ORDER — ACETAMINOPHEN 500 MG PO TABS
1000.0000 mg | ORAL_TABLET | ORAL | Status: AC
  Administered 2022-05-02: 1000 mg via ORAL

## 2022-05-02 MED ORDER — BUPIVACAINE-EPINEPHRINE 0.25% -1:200000 IJ SOLN
INTRAMUSCULAR | Status: DC | PRN
  Administered 2022-05-02: 20 mL

## 2022-05-02 MED ORDER — HYDROMORPHONE HCL 1 MG/ML IJ SOLN: 0.2500 mg | INTRAMUSCULAR | Status: DC | PRN

## 2022-05-02 MED ORDER — PROPOFOL 10 MG/ML IV BOLUS
INTRAVENOUS | Status: DC | PRN
  Administered 2022-05-02: 160 mg via INTRAVENOUS

## 2022-05-02 MED ORDER — FENTANYL CITRATE (PF) 250 MCG/5ML IJ SOLN
INTRAMUSCULAR | Status: DC | PRN
  Administered 2022-05-02 (×3): 50 ug via INTRAVENOUS
  Administered 2022-05-02 (×2): 25 ug via INTRAVENOUS

## 2022-05-02 MED ORDER — LIDOCAINE 2% (20 MG/ML) 5 ML SYRINGE
INTRAMUSCULAR | Status: DC | PRN
  Administered 2022-05-02: 60 mg via INTRAVENOUS

## 2022-05-02 MED ORDER — EPHEDRINE 5 MG/ML INJ
INTRAVENOUS | Status: AC
  Filled 2022-05-02: qty 5

## 2022-05-02 MED ORDER — PHENYLEPHRINE HCL (PRESSORS) 10 MG/ML IV SOLN
INTRAVENOUS | Status: DC | PRN
  Administered 2022-05-02: 80 ug via INTRAVENOUS

## 2022-05-02 MED ORDER — LACTATED RINGERS IV SOLN: INTRAVENOUS | Status: DC

## 2022-05-02 MED ORDER — CEFAZOLIN SODIUM-DEXTROSE 2-4 GM/100ML-% IV SOLN
INTRAVENOUS | Status: AC
  Filled 2022-05-02: qty 100

## 2022-05-02 MED ORDER — ACETAMINOPHEN 500 MG PO TABS
ORAL_TABLET | ORAL | Status: AC
  Filled 2022-05-02: qty 2

## 2022-05-02 MED ORDER — EPHEDRINE SULFATE-NACL 50-0.9 MG/10ML-% IV SOSY
PREFILLED_SYRINGE | INTRAVENOUS | Status: DC | PRN
  Administered 2022-05-02 (×2): 10 mg via INTRAVENOUS

## 2022-05-02 MED ORDER — DOCUSATE SODIUM 100 MG PO CAPS: 100.0000 mg | ORAL_CAPSULE | Freq: Two times a day (BID) | ORAL | 0 refills | Status: AC

## 2022-05-02 MED ORDER — MIDAZOLAM HCL 2 MG/2ML IJ SOLN
INTRAMUSCULAR | Status: DC | PRN
  Administered 2022-05-02: 2 mg via INTRAVENOUS

## 2022-05-02 MED ORDER — FENTANYL CITRATE (PF) 100 MCG/2ML IJ SOLN
INTRAMUSCULAR | Status: AC
  Filled 2022-05-02: qty 2

## 2022-05-02 MED ORDER — 0.9 % SODIUM CHLORIDE (POUR BTL) OPTIME
TOPICAL | Status: DC | PRN
  Administered 2022-05-02: 500 mL

## 2022-05-02 MED ORDER — BUPIVACAINE LIPOSOME 1.3 % IJ SUSP: 20.0000 mL | Freq: Once | INTRAMUSCULAR | Status: DC

## 2022-05-02 MED ORDER — PHENYLEPHRINE 80 MCG/ML (10ML) SYRINGE FOR IV PUSH (FOR BLOOD PRESSURE SUPPORT)
PREFILLED_SYRINGE | INTRAVENOUS | Status: AC
  Filled 2022-05-02: qty 10

## 2022-05-02 MED ORDER — ONDANSETRON HCL 4 MG/2ML IJ SOLN: 4.0000 mg | Freq: Once | INTRAMUSCULAR | Status: DC | PRN

## 2022-05-02 MED ORDER — ARTIFICIAL TEARS OPHTHALMIC OINT
TOPICAL_OINTMENT | OPHTHALMIC | Status: AC
  Filled 2022-05-02: qty 3.5

## 2022-05-02 MED ORDER — SODIUM CHLORIDE 0.9 % IV SOLN: 250.0000 mL | INTRAVENOUS | Status: DC | PRN

## 2022-05-02 MED ORDER — ACETAMINOPHEN 325 MG PO TABS: 650.0000 mg | ORAL_TABLET | ORAL | Status: DC | PRN

## 2022-05-02 MED ORDER — ONDANSETRON HCL 4 MG/2ML IJ SOLN
INTRAMUSCULAR | Status: DC | PRN
  Administered 2022-05-02: 4 mg via INTRAVENOUS

## 2022-05-02 MED ORDER — SODIUM CHLORIDE 0.9% FLUSH: 3.0000 mL | INTRAVENOUS | Status: DC | PRN

## 2022-05-02 SURGICAL SUPPLY — 45 items
ADH SKN CLS APL DERMABOND .7 (GAUZE/BANDAGES/DRESSINGS) ×1
APL PRP STRL LF DISP 70% ISPRP (MISCELLANEOUS) ×1
APL SKNCLS STERI-STRIP NONHPOA (GAUZE/BANDAGES/DRESSINGS) ×1
BENZOIN TINCTURE PRP APPL 2/3 (GAUZE/BANDAGES/DRESSINGS) ×1 IMPLANT
BINDER ABDOMINAL 12 ML 46-62 (SOFTGOODS) IMPLANT
BLADE SURG 15 STRL LF DISP TIS (BLADE) ×2 IMPLANT
BLADE SURG 15 STRL SS (BLADE) ×2
CHLORAPREP W/TINT 26 (MISCELLANEOUS) ×1 IMPLANT
COVER BACK TABLE 60X90IN (DRAPES) ×1 IMPLANT
COVER MAYO STAND STRL (DRAPES) ×1 IMPLANT
DERMABOND ADVANCED .7 DNX12 (GAUZE/BANDAGES/DRESSINGS) IMPLANT
DRAIN PENROSE 0.5X18 (DRAIN) ×1 IMPLANT
DRAPE LAPAROTOMY TRNSV 102X78 (DRAPES) ×1 IMPLANT
DRAPE UTILITY XL STRL (DRAPES) ×1 IMPLANT
ELECT REM PT RETURN 9FT ADLT (ELECTROSURGICAL) ×1
ELECTRODE REM PT RTRN 9FT ADLT (ELECTROSURGICAL) ×1 IMPLANT
GAUZE 4X4 16PLY ~~LOC~~+RFID DBL (SPONGE) IMPLANT
GAUZE SPONGE 4X4 12PLY STRL (GAUZE/BANDAGES/DRESSINGS) ×1 IMPLANT
GLOVE BIO SURGEON STRL SZ 6 (GLOVE) ×1 IMPLANT
GLOVE INDICATOR 6.5 STRL GRN (GLOVE) ×1 IMPLANT
GOWN STRL REUS W/TWL LRG LVL3 (GOWN DISPOSABLE) ×1 IMPLANT
KIT TURNOVER CYSTO (KITS) ×1 IMPLANT
MESH ULTRAPRO 3X6 7.6X15CM (Mesh General) IMPLANT
NEEDLE HYPO 22GX1.5 SAFETY (NEEDLE) ×1 IMPLANT
PACK BASIN DAY SURGERY FS (CUSTOM PROCEDURE TRAY) ×1 IMPLANT
PENCIL SMOKE EVACUATOR (MISCELLANEOUS) ×1 IMPLANT
SPIKE FLUID TRANSFER (MISCELLANEOUS) IMPLANT
SPONGE T-LAP 4X18 ~~LOC~~+RFID (SPONGE) ×1 IMPLANT
STRIP CLOSURE SKIN 1/2X4 (GAUZE/BANDAGES/DRESSINGS) ×1 IMPLANT
SUT ETHIBOND 0 MO6 C/R (SUTURE) ×1 IMPLANT
SUT MNCRL AB 4-0 PS2 18 (SUTURE) ×1 IMPLANT
SUT PDS AB 0 CT1 27 (SUTURE) ×2 IMPLANT
SUT SILK 3 0 (SUTURE) ×1
SUT SILK 3-0 18XBRD TIE 12 (SUTURE) ×1 IMPLANT
SUT VIC AB 3-0 SH 27 (SUTURE) ×2
SUT VIC AB 3-0 SH 27XBRD (SUTURE) ×2 IMPLANT
SUT VICRYL 3 0 BR 18  UND (SUTURE) ×1
SUT VICRYL 3 0 BR 18 UND (SUTURE) ×1 IMPLANT
SYR BULB IRRIG 60ML STRL (SYRINGE) ×1 IMPLANT
SYR CONTROL 10ML LL (SYRINGE) ×1 IMPLANT
TAPE CLOTH SURG 6X10 WHT LF (GAUZE/BANDAGES/DRESSINGS) IMPLANT
TOWEL OR 17X26 10 PK STRL BLUE (TOWEL DISPOSABLE) ×1 IMPLANT
TRAY FOL W/BAG SLVR 16FR STRL (SET/KITS/TRAYS/PACK) IMPLANT
TRAY FOLEY W/BAG SLVR 16FR LF (SET/KITS/TRAYS/PACK)
YANKAUER SUCT BULB TIP NO VENT (SUCTIONS) ×1 IMPLANT

## 2022-05-02 NOTE — Discharge Instructions (Addendum)
HERNIA REPAIR: POST OP INSTRUCTIONS   EAT Gradually transition to a high fiber diet with a fiber supplement over the next few weeks after discharge.  Start with a pureed / full liquid diet (see below)  WALK Walk an hour a day (cumulative- not all at once).  Control your pain to do that.    CONTROL PAIN Control pain so that you can walk, sleep, tolerate sneezing/coughing, and go up/down stairs.  HAVE A BOWEL MOVEMENT DAILY Keep your bowels regular to avoid problems.  OK to try a laxative to override constipation.  OK to use an antidiarrheal to slow down diarrhea.  Call if not better after 2 tries  CALL IF YOU HAVE PROBLEMS/CONCERNS Call if you are still struggling despite following these instructions. Call if you have concerns not answered by these instructions  ######################################################################    DIET: Follow a light bland diet & liquids the first 24 hours after arrival home, such as soup, liquids, starches, etc.  Be sure to drink plenty of fluids.  Quickly advance to a usual solid diet within a few days.  Avoid fast food or heavy meals initially as you are more likely to get nauseated or have irregular bowels.  Take your usually prescribed home medications unless otherwise directed.  PAIN CONTROL: Pain is best controlled by a usual combination of three different methods TOGETHER: Ice/Heat Over the counter pain medication Prescription pain medication Most patients will experience some swelling and bruising around the hernia(s) such as the bellybutton, groins, or old incisions.  Ice packs or heating pads (30-60 minutes up to 6 times a day) will help. Use ice for the first few days to help decrease swelling and bruising, then switch to heat to help relax tight/sore spots and speed recovery.  Some people prefer to use ice alone, heat alone, alternating between ice & heat.  Experiment to what works for you.  Swelling and bruising can take several weeks  to resolve.   It is helpful to take an over-the-counter pain medication regularly for the first days: Naproxen (Aleve, etc)  Two '220mg'$  tabs twice a day OR Ibuprofen (Advil, etc) Three '200mg'$  tabs four times a day (every meal & bedtime) AND Acetaminophen (Tylenol, etc) 325-'650mg'$  four times a day (every meal & bedtime) A  prescription for pain medication should be given to you upon discharge.  Take your pain medication as prescribed, IF NEEDED.  If you are having problems/concerns with the prescription medicine (does not control pain, nausea, vomiting, rash, itching, etc), please call us [phone] to see if we need to switch you to a different pain medicine that will work better for you and/or control your side effect better. If you need a refill on your pain medication, please contact your pharmacy.  They will contact our office to request authorization. Prescriptions will not be filled after 5 pm or on week-ends.  Avoid getting constipated.  Between the surgery and the pain medications, it is common to experience some constipation.  Increasing fluid intake and taking a fiber supplement (such as Metamucil, Citrucel, FiberCon, MiraLax, etc) 1-2 times a day regularly will usually help prevent this problem from occurring.  A mild laxative (prune juice, Milk of Magnesia, MiraLax, etc) should be taken according to package directions if there are no bowel movements after 48 hours.    Wash / shower every day, starting 2 days after surgery.  You may shower over the steri strips which are waterproof.  No rubbing, scrubbing, lotions or ointments to  incision(s). Do not soak or submerge.   Remove your outer bandage 2 days after surgery. Steri strips (if present) will peel off after 1-2 weeks.  You may leave the incision open to air.  You may replace a dressing/Band-Aid to cover an incision for comfort if you wish.  Continue to shower over incision(s) after the dressing is off.  ACTIVITIES as tolerated:   You  may resume regular (light) daily activities beginning the next day--such as daily self-care, walking, climbing stairs--gradually increasing activities as tolerated.  Control your pain so that you can walk an hour a day.  If you can walk 30 minutes without difficulty, it is safe to try more intense activity such as jogging, treadmill, bicycling, low-impact aerobics, swimming, etc. Refrain from the most intensive and strenuous activity such as sit-ups, heavy lifting, contact sports, etc  Refrain from any heavy lifting or straining until 6 weeks after surgery.   DO NOT PUSH THROUGH PAIN.  Let pain be your guide: If it hurts to do something, don't do it.  Pain is your body warning you to avoid that activity for another week until the pain goes down. You may drive when you are no longer taking prescription pain medication, you can comfortably wear a seatbelt, and you can safely maneuver your car and apply brakes. You may have sexual intercourse when it is comfortable.   FOLLOW UP in our office Please call [facility] at [phone] to set up an appointment to see your surgeon in the office for a follow-up appointment approximately 2-3 weeks after your surgery. Make sure that you call for this appointment the day you arrive home to insure a convenient appointment time.  9.  If you have disability of FMLA / Family leave forms, please bring the forms to the office for processing.  (do not give to your surgeon).  WHEN TO CALL us [phone]: Poor pain control Reactions / problems with new medications (rash/itching, nausea, etc)  Fever over 101.5 F (38.5 C) Inability to urinate Nausea and/or vomiting Worsening swelling or bruising Continued bleeding from incision. Increased pain, redness, or drainage from the incision   The clinic staff is available to answer your questions during regular business hours (8:30am-5pm).  Please don't hesitate to call and ask to speak to one of our nurses for clinical  concerns.   If you have a medical emergency, go to the nearest emergency room or call 911.  A surgeon from [facility] is always on call at the hospitals in [facility], [place], [place], [place], [place]  [address] ?  [address] [address], [place], [place]   [address] MAIN: [id number] ? TOLL FREE: [phone] ? FAX: [phone] [email or web address]    Post Anesthesia Home Care Instructions  Activity: Get plenty of rest for the remainder of the day. A responsible individual must stay with you for 24 hours following the procedure.  For the next 24 hours, DO NOT: -Drive a car -Paediatric nurse -Drink alcoholic beverages -Take any medication unless instructed by your physician -Make any legal decisions or sign important papers.  Meals: Start with liquid foods such as gelatin or soup. Progress to regular foods as tolerated. Avoid greasy, spicy, heavy foods. If nausea and/or vomiting occur, drink only clear liquids until the nausea and/or vomiting subsides. Call your physician if vomiting continues.  Special Instructions/Symptoms: Your throat may feel dry or sore from the anesthesia or the breathing tube placed in your throat  during surgery. If this causes discomfort, gargle with warm salt water. The discomfort should disappear within 24 hours.   Information for Discharge Teaching: EXPAREL (bupivacaine liposome injectable suspension)   Your surgeon or anesthesiologist gave you EXPAREL(bupivacaine) to help control your pain after surgery.  EXPAREL is a local anesthetic that provides pain relief by numbing the tissue around the surgical site. EXPAREL is designed to release pain medication over time and can control pain for up to 72 hours. Depending on how you respond to EXPAREL, you may require less pain medication during your recovery.  Possible side effects: Temporary loss of sensation or ability to move in the area  where bupivacaine was injected. Nausea, vomiting, constipation Rarely, numbness and tingling in your mouth or lips, lightheadedness, or anxiety may occur. Call your doctor right away if you think you may be experiencing any of these sensations, or if you have other questions regarding possible side effects.  Follow all other discharge instructions given to you by your surgeon or nurse. Eat a healthy diet and drink plenty of water or other fluids.  If you return to the hospital for any reason within 96 hours following the administration of EXPAREL, it is important for health care providers to know that you have received this anesthetic. A teal colored band has been placed on your arm with the date, time and amount of EXPAREL you have received in order to alert and inform your health care providers. Please leave this armband in place for the full 96 hours following administration, and then you may remove the band.  Do not remove green armband before Tuesday, May 06, 2022.

## 2022-05-02 NOTE — Anesthesia Postprocedure Evaluation (Signed)
Anesthesia Post Note  Patient: Todd [name]  Procedure(s) Performed: OPEN LEFT INGUINAL HERNIA REPAIR WITH MESH (Left: Groin)     Patient location during evaluation: PACU Anesthesia Type: General Level of consciousness: awake and alert and oriented Pain management: pain level controlled Vital Signs Assessment: post-procedure vital signs reviewed and stable Respiratory status: spontaneous breathing, nonlabored ventilation and respiratory function stable Cardiovascular status: blood pressure returned to baseline and stable Postop Assessment: no apparent nausea or vomiting Anesthetic complications: no   No notable events documented.  Last Vitals:  Vitals:   05/02/22 1245 05/02/22 1300  BP: (!) 140/90 (!) 151/96  Pulse: 72 79  Resp: 11 11  Temp:  36.6 C  SpO2: 96% 96%    Last Pain:  Vitals:   05/02/22 1245  TempSrc:   PainSc: 0-No pain                 Josuha Fontanez A.

## 2022-05-02 NOTE — Anesthesia Preprocedure Evaluation (Signed)
Anesthesia Evaluation  Patient identified by MRN, date of birth, ID band Patient awake    Reviewed: Allergy & Precautions, NPO status , Patient's Chart, lab work & pertinent test results  Airway Mallampati: II  TM Distance: <3 FB Neck ROM: Limited  Mouth opening: Limited Mouth Opening  Dental no notable dental hx. 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 Right Inguinal hernia   Abdominal   Peds  Hematology  (+) Blood dyscrasia, anemia   Anesthesia Other Findings   Reproductive/Obstetrics                             Anesthesia Physical Anesthesia Plan  ASA: 2  Anesthesia Plan: General   Post-op Pain Management: Minimal or no pain anticipated, Ofirmev IV (intra-op)* and Precedex   Induction: Intravenous  PONV Risk Score and Plan: 4 or greater and Treatment may vary due to age or medical condition, Midazolam, Ondansetron and Dexamethasone  Airway Management Planned: LMA  Additional Equipment: None  Intra-op Plan:   Post-operative Plan: Extubation in OR  Informed Consent: I have reviewed the patients History and Physical, chart, labs and discussed the procedure including the risks, benefits and alternatives for the proposed anesthesia with the patient or authorized representative who has  indicated his/her understanding and acceptance.     Dental advisory given  Plan Discussed with:   Anesthesia Plan Comments:        Anesthesia Quick Evaluation

## 2022-05-02 NOTE — Transfer of Care (Signed)
Immediate Anesthesia Transfer of Care Note  Patient: Todd [name]  Procedure(s) Performed: Procedure(s) (LRB): OPEN LEFT INGUINAL HERNIA REPAIR WITH MESH (Left)  Patient Location: PACU  Anesthesia Type: General  Level of Consciousness: awake, sedated, patient cooperative and responds to stimulation  Airway & Oxygen Therapy: Patient Spontanous Breathing and Patient connected to [place] oxygen  Post-op Assessment: Report given to PACU RN, Post -op Vital signs reviewed and stable and Patient moving all extremities  Post vital signs: Reviewed and stable  Complications: No apparent anesthesia complications

## 2022-05-02 NOTE — Anesthesia Procedure Notes (Signed)
Procedure Name: LMA Insertion Date/Time: 05/02/2022 10:46 AM  Performed by: Justice Rocher, CRNAPre-anesthesia Checklist: Patient identified, Emergency Drugs available, Suction available, Patient being monitored and Timeout performed Patient Re-evaluated:Patient Re-evaluated prior to induction Oxygen Delivery Method: Circle system utilized Preoxygenation: Pre-oxygenation with 100% oxygen Induction Type: IV induction Ventilation: Mask ventilation without difficulty LMA: LMA inserted LMA Size: 4.0 Number of attempts: 1 Airway Equipment and Method: Bite block Placement Confirmation: positive ETCO2, breath sounds checked- equal and bilateral and CO2 detector Tube secured with: Tape Dental Injury: Teeth and Oropharynx as per pre-operative assessment

## 2022-05-02 NOTE — Op Note (Signed)
Operative Note  Todd [name]  [id number]  [id number]  05/02/2022   Surgeon: Clovis Riley MD FACS   Procedure performed: Open left inguinal hernia repair with mesh   Preop diagnosis:  left inguinal hernia   Post-op diagnosis/intraop findings:  Large direct and small indirect abdominal hernia   Specimens: none   EBL: 5cc   Complications: none   Description of procedure: After confirming informed consent, the patient was taken to the operating room and placed supine on operating room table where general anesthesia was initiated, preoperative antibiotics were administered, SCDs applied, and a formal timeout was performed. The groin was clipped, prepped and draped in the usual sterile fashion. An oblique incision was made the just above the inguinal ligament after infiltrating the tissues with local anesthetic (Exparel mixed with quarter percent Marcaine). Soft tissues were dissected using electrocautery until the external oblique aponeurosis was encountered. This was divided sharply to expand the external ring. A plane was bluntly developed between the spermatic cord and the external oblique. The ilioinguinal nerve was divided between hemostats and each and ligated with 0 Vicryl ties. The spermatic cord was then bluntly dissected away from the pubic tubercle and encircled with a Penrose. Inspection of the inguinal anatomy revealed a large direct hernia as well as a very small indirect hernia sac, with essentially disruption of the inguinal floor. The indirect hernia sac was bluntly dissected away from the cord structures and skeletonized to the level of the internal ring, where it was reduced intact into the abdomen.  The direct hernia sac was reduced intact and the inguinal floor was reconstructed suturing the conjoint tendon to the inguinal ligament with interrupted 0 PDS, leaving an internal ring just sufficient for the cord structures. A 3 x 6 piece of ultra Pro mesh was brought onto the  field and trimmed to approximate the field. This was sutured to the pubic tubercle fascia, inferior shelving edge and to the internal oblique superiorly with interrupted 0 ethibonds. The tails of the mesh were wrapped around the spermatic cord, ensuring adequate room for the cord, and sutured to each other with 0 ethibond, and then directed laterally to lie flat beneath the external oblique aponeurosis.  An additional suture was placed just medial to the cord to reinforce the mesh which had been divided. Hemostasis was ensured within the wound. The Penrose was removed. The external oblique aponeurosis was reapproximated with a running 3-0 Vicryl to re-create a narrowed external ring. More local was infiltrated around the pubic tubercle and in the plane just below the external oblique. The Scarpa's was reapproximated with interrupted 3-0 Vicryls. The skin was closed with a running subcuticular 4-0 Monocryl. The remainder of the local was injected in the subcutaneous and subcuticular space. The field was then cleaned, benzoin and Steri-Strips and sterile bandage were applied.  The patient was then awakened extubated and taken to PACU in stable condition.    All counts were correct at the completion of the case

## 2022-05-07 ENCOUNTER — Encounter (HOSPITAL_BASED_OUTPATIENT_CLINIC_OR_DEPARTMENT_OTHER): Payer: Self-pay | Admitting: Surgery

## 2022-05-08 DIAGNOSIS — R1312 Dysphagia, oropharyngeal phase: Secondary | ICD-10-CM | POA: Diagnosis not present

## 2022-05-08 DIAGNOSIS — Y842 Radiological procedure and radiotherapy as the cause of abnormal reaction of the patient, or of later complication, without mention of misadventure at the time of the procedure: Secondary | ICD-10-CM | POA: Diagnosis not present

## 2022-06-17 DIAGNOSIS — M25511 Pain in right shoulder: Secondary | ICD-10-CM | POA: Diagnosis not present

## 2022-06-26 DIAGNOSIS — M542 Cervicalgia: Secondary | ICD-10-CM | POA: Diagnosis not present

## 2022-06-26 DIAGNOSIS — M79621 Pain in right upper arm: Secondary | ICD-10-CM | POA: Diagnosis not present

## 2022-07-10 DIAGNOSIS — M542 Cervicalgia: Secondary | ICD-10-CM | POA: Diagnosis not present

## 2022-07-10 DIAGNOSIS — M79621 Pain in right upper arm: Secondary | ICD-10-CM | POA: Diagnosis not present

## 2022-07-20 DIAGNOSIS — M542 Cervicalgia: Secondary | ICD-10-CM | POA: Diagnosis not present

## 2022-07-30 DIAGNOSIS — N183 Chronic kidney disease, stage 3 unspecified: Secondary | ICD-10-CM | POA: Diagnosis not present

## 2022-07-30 DIAGNOSIS — I890 Lymphedema, not elsewhere classified: Secondary | ICD-10-CM | POA: Diagnosis not present

## 2022-07-30 DIAGNOSIS — I129 Hypertensive chronic kidney disease with stage 1 through stage 4 chronic kidney disease, or unspecified chronic kidney disease: Secondary | ICD-10-CM | POA: Diagnosis not present

## 2022-07-30 DIAGNOSIS — N1831 Chronic kidney disease, stage 3a: Secondary | ICD-10-CM | POA: Diagnosis not present

## 2022-07-30 DIAGNOSIS — E039 Hypothyroidism, unspecified: Secondary | ICD-10-CM | POA: Diagnosis not present

## 2022-07-30 DIAGNOSIS — R7301 Impaired fasting glucose: Secondary | ICD-10-CM | POA: Diagnosis not present

## 2022-08-01 DIAGNOSIS — M542 Cervicalgia: Secondary | ICD-10-CM | POA: Diagnosis not present

## 2022-08-11 DIAGNOSIS — M5412 Radiculopathy, cervical region: Secondary | ICD-10-CM | POA: Diagnosis not present

## 2022-08-11 DIAGNOSIS — M47812 Spondylosis without myelopathy or radiculopathy, cervical region: Secondary | ICD-10-CM | POA: Diagnosis not present

## 2022-08-12 DIAGNOSIS — J302 Other seasonal allergic rhinitis: Secondary | ICD-10-CM | POA: Diagnosis not present

## 2022-08-12 DIAGNOSIS — R051 Acute cough: Secondary | ICD-10-CM | POA: Diagnosis not present

## 2022-08-12 DIAGNOSIS — J189 Pneumonia, unspecified organism: Secondary | ICD-10-CM | POA: Diagnosis not present

## 2022-08-12 DIAGNOSIS — R634 Abnormal weight loss: Secondary | ICD-10-CM | POA: Diagnosis not present

## 2022-08-15 ENCOUNTER — Telehealth: Payer: Self-pay

## 2022-08-15 NOTE — Telephone Encounter (Signed)
Returned call to wife. Romyn is having speech issues and seeing speech therapy. He has lost a lot of weight. They have been referred back to Dr. Jenne Pane but they are having a hard time getting appt.  Above given to Dr. Bertis Ruddy. Scheduled appt on 4/16 1230 lab and see Dr. Bertis Ruddy at 1 pm. Wife is aware of appt.

## 2022-08-20 DIAGNOSIS — Z85819 Personal history of malignant neoplasm of unspecified site of lip, oral cavity, and pharynx: Secondary | ICD-10-CM | POA: Diagnosis not present

## 2022-08-20 DIAGNOSIS — M542 Cervicalgia: Secondary | ICD-10-CM | POA: Diagnosis not present

## 2022-08-20 DIAGNOSIS — M79621 Pain in right upper arm: Secondary | ICD-10-CM | POA: Diagnosis not present

## 2022-08-20 DIAGNOSIS — J383 Other diseases of vocal cords: Secondary | ICD-10-CM | POA: Diagnosis not present

## 2022-08-20 DIAGNOSIS — R1313 Dysphagia, pharyngeal phase: Secondary | ICD-10-CM | POA: Diagnosis not present

## 2022-08-25 ENCOUNTER — Other Ambulatory Visit: Payer: Self-pay | Admitting: Hematology and Oncology

## 2022-08-25 DIAGNOSIS — C099 Malignant neoplasm of tonsil, unspecified: Secondary | ICD-10-CM

## 2022-08-26 ENCOUNTER — Other Ambulatory Visit: Payer: Self-pay

## 2022-08-26 ENCOUNTER — Encounter: Payer: Self-pay | Admitting: Hematology and Oncology

## 2022-08-26 ENCOUNTER — Inpatient Hospital Stay: Payer: BC Managed Care – PPO | Admitting: Hematology and Oncology

## 2022-08-26 ENCOUNTER — Telehealth: Payer: Self-pay

## 2022-08-26 ENCOUNTER — Inpatient Hospital Stay: Payer: BC Managed Care – PPO | Attending: Hematology and Oncology

## 2022-08-26 VITALS — BP 125/82 | HR 69 | Temp 97.5°F | Resp 18 | Ht 70.0 in | Wt 160.4 lb

## 2022-08-26 DIAGNOSIS — R634 Abnormal weight loss: Secondary | ICD-10-CM

## 2022-08-26 DIAGNOSIS — C099 Malignant neoplasm of tonsil, unspecified: Secondary | ICD-10-CM | POA: Diagnosis not present

## 2022-08-26 DIAGNOSIS — Z79899 Other long term (current) drug therapy: Secondary | ICD-10-CM | POA: Insufficient documentation

## 2022-08-26 DIAGNOSIS — R918 Other nonspecific abnormal finding of lung field: Secondary | ICD-10-CM | POA: Diagnosis not present

## 2022-08-26 LAB — CBC WITH DIFFERENTIAL (CANCER CENTER ONLY)
Abs Immature Granulocytes: 0.03 10*3/uL (ref 0.00–0.07)
Basophils Absolute: 0.1 10*3/uL (ref 0.0–0.1)
Basophils Relative: 1 %
Eosinophils Absolute: 0.2 10*3/uL (ref 0.0–0.5)
Eosinophils Relative: 2 %
HCT: 34.6 % — ABNORMAL LOW (ref 39.0–52.0)
Hemoglobin: 11.7 g/dL — ABNORMAL LOW (ref 13.0–17.0)
Immature Granulocytes: 0 %
Lymphocytes Relative: 8 %
Lymphs Abs: 0.6 10*3/uL — ABNORMAL LOW (ref 0.7–4.0)
MCH: 28.5 pg (ref 26.0–34.0)
MCHC: 33.8 g/dL (ref 30.0–36.0)
MCV: 84.4 fL (ref 80.0–100.0)
Monocytes Absolute: 0.8 10*3/uL (ref 0.1–1.0)
Monocytes Relative: 10 %
Neutro Abs: 6.6 10*3/uL (ref 1.7–7.7)
Neutrophils Relative %: 79 %
Platelet Count: 374 10*3/uL (ref 150–400)
RBC: 4.1 MIL/uL — ABNORMAL LOW (ref 4.22–5.81)
RDW: 14.1 % (ref 11.5–15.5)
WBC Count: 8.3 10*3/uL (ref 4.0–10.5)
nRBC: 0 % (ref 0.0–0.2)

## 2022-08-26 LAB — CMP (CANCER CENTER ONLY)
ALT: 16 U/L (ref 0–44)
AST: 23 U/L (ref 15–41)
Albumin: 4 g/dL (ref 3.5–5.0)
Alkaline Phosphatase: 43 U/L (ref 38–126)
Anion gap: 4 — ABNORMAL LOW (ref 5–15)
BUN: 29 mg/dL — ABNORMAL HIGH (ref 8–23)
CO2: 32 mmol/L (ref 22–32)
Calcium: 10 mg/dL (ref 8.9–10.3)
Chloride: 102 mmol/L (ref 98–111)
Creatinine: 1.08 mg/dL (ref 0.61–1.24)
GFR, Estimated: >60 mL/min (ref >60)
Glucose, Bld: 108 mg/dL — ABNORMAL HIGH (ref 70–99)
Potassium: 4.3 mmol/L (ref 3.5–5.1)
Sodium: 138 mmol/L (ref 135–145)
Total Bilirubin: 0.3 mg/dL (ref 0.3–1.2)
Total Protein: 7.2 g/dL (ref 6.5–8.1)

## 2022-08-26 LAB — TSH: TSH: 1.418 u[IU]/mL (ref 0.350–4.500)

## 2022-08-26 NOTE — Progress Notes (Signed)
[facility] OFFICE PROGRESS NOTE  Patient Care Team: Tisovec, Adelfa Koh, MD as PCP - General (Internal Medicine) Barrie Folk, RN (Inactive) as Oncology Nurse Navigator Lonie Peak, MD as Attending Physician (Radiation Oncology) Artis Delay, MD as Consulting Physician (Hematology and Oncology) Anabel Bene, RD as Dietitian (Nutrition)  ASSESSMENT & PLAN:  [facility]) The patient had recent profound weight loss despite taking in approximately 2600 cal/day He was evaluated by ENT physician recently I reviewed the records Laryngoscopy was performed in the office: "Findings included normal nasal passages, no mass or abnormality in the nasopharynx, and no mass or ulceration in the pharynx or larynx. Pyriform sinuses are open. Secretions are pooled somewhat in vallecula and pyriforms. There are radiation changes to the larynx and pharynx. There is a small bump on the right mid-cord in the striking zone. The vocal folds adduct and abduct symmetrically. There is good glottal closure. Muscle tension patterns are not present. Laryngeal edema is minimal" His neck appears to be more fibrosed compared to his prior visit Ultimately, I am not able to find out the cause of his extensive weight loss Due to his history of advanced stage disease, recurrent cancer must be ruled out I recommend PET/CT imaging for further evaluation I will see him back after the test is done to review test results  Weight loss He has recent profound weight loss, unknown etiology Thyroid function test is pending but his recent test from a month ago was normal Due to unexplained weight loss, I recommend imaging study and he is in agreement In the meantime, he will continue frequent small meals with high-calorie diet  Orders Placed This Encounter  Procedures   NM PET Image Restage (PS) Skull Base to Thigh (F-18 FDG)    Standing Status:   Future    Standing Expiration Date:   08/26/2023    Order  Specific Question:   If indicated for the ordered procedure, I authorize the administration of a radiopharmaceutical per Radiology protocol    Answer:   Yes    Order Specific Question:   Preferred imaging location?    Answer:   [facility]    Order Specific Question:   Radiology Contrast Protocol - do NOT remove file path    Answer:   [email or web address]    All questions were answered. The patient knows to call the clinic with any problems, questions or concerns. The total time spent in the appointment was 30 minutes encounter with patients including review of chart and various tests results, discussions about plan of care and coordination of care plan   Artis Delay, MD 08/26/2022 1:21 PM  INTERVAL HISTORY: Please see below for problem oriented charting. 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 miles every day at work He had recent intermittent dysphagia but denies recent choking He was evaluated by ENT surgeon recently  REVIEW OF SYSTEMS:   Constitutional: Denies fevers, chills  Eyes: Denies blurriness of vision Ears, nose, mouth, throat, and face: Denies mucositis or sore throat Respiratory: Denies cough, dyspnea or wheezes Cardiovascular: Denies palpitation, chest discomfort or lower extremity swelling Gastrointestinal:  Denies nausea, heartburn or change in bowel habits Skin: Denies abnormal skin rashes Lymphatics: Denies new lymphadenopathy or easy bruising Neurological:Denies numbness, tingling or new weaknesses  Behavioral/Psych: Mood is stable, no new changes  All other systems were reviewed with the patient and are negative.  I have reviewed the past medical history, past surgical history, social history and family history  with the patient and they are unchanged from previous note.  ALLERGIES:  is allergic to phenergan [promethazine hcl].  MEDICATIONS:  Current Outpatient Medications  Medication Sig Dispense Refill   acetaminophen (TYLENOL) 650 MG CR tablet Take 650 mg by mouth every 8 (eight) hours as needed for pain.     amLODipine (NORVASC) 10 MG tablet Take 1 tablet (10 mg total) by mouth daily. 30 tablet 1   Ascorbic Acid (VITAMIN C) 1000 MG tablet Take 200 mg by mouth daily.     aspirin EC 81 MG tablet Take 81 mg by mouth daily. Swallow whole.     Coenzyme Q10 (COQ-10) 200 MG CAPS Take 1 capsule by mouth daily.     Famotidine (PEPCID PO) Take 1 tablet by mouth as needed (gerd).     fenofibrate (TRICOR) 145 MG tablet Take 145 mg by mouth daily.      fexofenadine (ALLEGRA) 180 MG tablet Take 180 mg by mouth daily.     glucosamine-chondroitin 500-400 MG tablet Take 2 tablets by mouth every morning.     guaiFENesin (MUCINEX) 600 MG 12 hr tablet Take 600 mg by mouth 2 (two) times daily as needed for cough or to loosen phlegm.     levothyroxine (SYNTHROID) 125 MCG tablet Take 125 mcg by mouth daily before breakfast.     Multiple Vitamin (MULTIVITAMIN) capsule Take 1 capsule by mouth daily.     tamsulosin (FLOMAX) 0.4 MG CAPS capsule Take 0.4 mg by mouth daily.     No current facility-administered medications for this visit.    SUMMARY OF ONCOLOGIC HISTORY: Oncology History  Tonsil cancer  02/07/2016 Imaging   Ct neck showed abnormal soft tissue within the left pharynx, extending from the left tonsillar pillar inferiorly to the level of the hyoid and filling the left vallecula. The appearance is most consistent with a neoplastic process. 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 Histologic sampling and direct visualization should be considered. Confluent adenopathy within the left cervical chain with areas of central hypoattenuation suggesting degrees of necrosis. This is most  suggestive of metastatic spread. The areas of necrosis within the adenopathy combined with the above-described pharyngeal soft tissue mass are together suggestive of squamous cell carcinoma.   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 Borderline hypermetabolic small right station IIb lymph node. Several tiny lung nodules are observed, these are not hypermetabolic but are also below the threshold level for accurate characterization. Dedicated CT chest may be warranted to act as a baseline.   03/20/2016 Procedure   Placement of a right internal jugular approach power injectable Port-A-Cath. Fluoroscopic insertion of a 20-French pull-through gastrostomy tube.    03/24/2016 - 05/06/2016 Chemotherapy   He received high dose cisplatin; dose 2 and 3 with dose reduction due to side-effects   03/24/2016 - 05/15/2016 Radiation Therapy   Site/dose:  Left Tonsil and Bilateral Neck / 70 Gy in 35 fractions to gross disease, 63 Gy in 35 fractions to high risk nodal  echelons, and 56 Gy in 35 fractions to intermediate risk nodal echelons.  Beams/energy:   Helical IMRT / 6 MV photons   09/04/2016 Imaging   1. Significant response to therapy of left oropharyngeal/palatine tonsil primary. Low-level residual hypermetabolism could be treatment related or represent residual disease. 2. Resolution of cervical adenopathy. Low-level hypermetabolism within only 1 level 2 node remains. 3. No evidence of hypermetabolic extra cervical disease. 4. Age advanced coronary artery atherosclerosis. Recommend assessment of coronary risk factors and consideration of medical therapy. 5. Progressive sinus disease. 6. Bilateral pulmonary  nodules. A right lower lobe nodule may be new. Below PET resolution. Recommend attention on follow-up.    11/24/2016 Imaging   1. Post radiation changes in the neck. Left tonsillar mass and malignant lymphadenopathy appear resolved by CT, with residual 7-8 mm short axis bilateral level 2 lymph nodes, rim calcified on the left. 2. Interval bilateral maxilla wisdom tooth extractions with new inflammation in the right maxillary sinus which appears related to the tooth extraction site. Recommend ENT consultation. 3. Chest CT today is reported separately.   11/24/2016 Imaging   1. 3 mm posterior right lower lobe pulmonary nodule seen on the previous PET-CT has resolved completely in the interval. With 3 mm nodule in the posterior left lower lobe is unchanged. Continued attention on follow-up recommended. 2. No new or progressive findings on today's exam   01/30/2017 Procedure   Removal of percutaneous gastrostomy tube and port-a-cath.   11/23/2017 Imaging   Negative for recurrent tumor or adenopathy in the neck.  Improvement in post radiation changes involving the anterior neck.  Near complete opacification right maxillary sinus. This is likely odontogenic due to bony defect from prior third molar extraction.   11/23/2017 Imaging   1. Stable 3 mm posterior left lower lobe lung nodule is unchanged from previous exam. Favor benign abnormality. No new pulmonary nodules or evidence of metastatic disease identified. 2. Lad coronary artery atherosclerotic calcifications.     PHYSICAL EXAMINATION: ECOG PERFORMANCE STATUS: 0 - Asymptomatic  Vitals:   08/26/22 1233  BP: 125/82  Pulse: 69  Resp: 18  Temp: (!) 97.5 F (36.4 C)  SpO2: 100%   Filed Weights   08/26/22 1233  Weight: 160 lb 6.4 oz (72.8 kg)    GENERAL:alert, no distress and comfortable SKIN: skin color, texture, turgor are normal, no rashes or significant lesions EYES: normal, Conjunctiva are pink and non-injected, sclera  clear OROPHARYNX:no exudate, no erythema and lips, buccal mucosa, and tongue normal  NECK: Significant neck fibrosis is noted with change in his posture LYMPH:  no palpable lymphadenopathy in the cervical, axillary or inguinal LUNGS: clear to auscultation and percussion with normal breathing effort HEART: regular rate & rhythm and no murmurs and no lower extremity edema ABDOMEN:abdomen soft, non-tender and normal bowel sounds Musculoskeletal:no cyanosis of digits and no clubbing  NEURO: alert & oriented x 3 with fluent speech, no focal motor/sensory deficits  LABORATORY DATA:  I have reviewed the data as listed    Component Value Date/Time   NA 138 08/26/2022 1219   NA 140 02/16/2017 0841   K 4.3 08/26/2022 1219   K 4.1 02/16/2017 0841   CL 102 08/26/2022 1219   CO2 32 08/26/2022 1219   CO2 29 02/16/2017 0841   GLUCOSE 108 (H) 08/26/2022 1219   GLUCOSE 94 02/16/2017 0841   BUN 29 (H) 08/26/2022 1219   BUN 19.8 02/16/2017 0841   CREATININE 1.08 08/26/2022 1219   CREATININE 1.3 02/16/2017 0841  CALCIUM 10.0 08/26/2022 1219   CALCIUM 10.1 02/16/2017 0841   PROT 7.2 08/26/2022 1219   PROT 6.8 02/16/2017 0841   ALBUMIN 4.0 08/26/2022 1219   ALBUMIN 4.1 02/16/2017 0841   AST 23 08/26/2022 1219   AST 19 02/16/2017 0841   ALT 16 08/26/2022 1219   ALT 15 02/16/2017 0841   ALKPHOS 43 08/26/2022 1219   ALKPHOS 32 (L) 02/16/2017 0841   BILITOT 0.3 08/26/2022 1219   BILITOT 0.41 02/16/2017 0841   GFRNONAA >60 08/26/2022 1219   GFRAA >60 11/23/2017 1126    No results found for: "SPEP", "UPEP"  Lab Results  Component Value Date   WBC 8.3 08/26/2022   NEUTROABS 6.6 08/26/2022   HGB 11.7 (L) 08/26/2022   HCT 34.6 (L) 08/26/2022   MCV 84.4 08/26/2022   PLT 374 08/26/2022      Chemistry      Component Value Date/Time   NA 138 08/26/2022 1219   NA 140 02/16/2017 0841   K 4.3 08/26/2022 1219   K 4.1 02/16/2017 0841   CL 102 08/26/2022 1219   CO2 32 08/26/2022 1219   CO2  29 02/16/2017 0841   BUN 29 (H) 08/26/2022 1219   BUN 19.8 02/16/2017 0841   CREATININE 1.08 08/26/2022 1219   CREATININE 1.3 02/16/2017 0841      Component Value Date/Time   CALCIUM 10.0 08/26/2022 1219   CALCIUM 10.1 02/16/2017 0841   ALKPHOS 43 08/26/2022 1219   ALKPHOS 32 (L) 02/16/2017 0841   AST 23 08/26/2022 1219   AST 19 02/16/2017 0841   ALT 16 08/26/2022 1219   ALT 15 02/16/2017 0841   BILITOT 0.3 08/26/2022 1219   BILITOT 0.41 02/16/2017 0841

## 2022-08-26 NOTE — Telephone Encounter (Signed)
Called and left a message asking wife to call the office back. Scheduling appt on 4/29 with Dr. Bertis Ruddy.

## 2022-08-26 NOTE — Assessment & Plan Note (Signed)
He has recent profound weight loss, unknown etiology Thyroid function test is pending but his recent test from a month ago was normal Due to unexplained weight loss, I recommend imaging study and he is in agreement In the meantime, he will continue frequent small meals with high-calorie diet

## 2022-08-26 NOTE — Assessment & Plan Note (Signed)
The patient had recent profound weight loss despite taking in approximately 2600 cal/day He was evaluated by ENT physician recently I reviewed the records Laryngoscopy was performed in the office: "Findings included normal nasal passages, no mass or abnormality in the nasopharynx, and no mass or ulceration in the pharynx or larynx. Pyriform sinuses are open. Secretions are pooled somewhat in vallecula and pyriforms. There are radiation changes to the larynx and pharynx. There is a small bump on the right mid-cord in the striking zone. The vocal folds adduct and abduct symmetrically. There is good glottal closure. Muscle tension patterns are not present. Laryngeal edema is minimal" His neck appears to be more fibrosed compared to his prior visit Ultimately, I am not able to find out the cause of his extensive weight loss Due to his history of advanced stage disease, recurrent cancer must be ruled out I recommend PET/CT imaging for further evaluation I will see him back after the test is done to review test results

## 2022-08-27 ENCOUNTER — Telehealth: Payer: Self-pay

## 2022-08-27 NOTE — Telephone Encounter (Signed)
Pt's wife called and states 09/08/22 at 1245 would be best. Need to confirm if pt will need labs before visit. Message routed to covering RN and MD.

## 2022-08-28 NOTE — Telephone Encounter (Signed)
No need labs Steward Drone, can you schedule the appt? Thanks

## 2022-08-28 NOTE — Telephone Encounter (Signed)
Called and left a message appt for Todd [name] is scheduled on 4/29 at 1245. Ask her to call the office for questions.

## 2022-09-04 ENCOUNTER — Encounter (HOSPITAL_COMMUNITY)
Admission: RE | Admit: 2022-09-04 | Discharge: 2022-09-04 | Disposition: A | Discharge status: Home / Self Care | Payer: BC Managed Care – PPO | Source: Ambulatory Visit | Attending: Hematology and Oncology | Admitting: Hematology and Oncology

## 2022-09-04 DIAGNOSIS — C099 Malignant neoplasm of tonsil, unspecified: Secondary | ICD-10-CM | POA: Diagnosis not present

## 2022-09-04 DIAGNOSIS — J189 Pneumonia, unspecified organism: Secondary | ICD-10-CM | POA: Diagnosis not present

## 2022-09-04 DIAGNOSIS — R918 Other nonspecific abnormal finding of lung field: Secondary | ICD-10-CM | POA: Diagnosis not present

## 2022-09-04 MED ORDER — FLUDEOXYGLUCOSE F - 18 (FDG) INJECTION
8.1800 | Freq: Once | INTRAVENOUS | Status: AC | PRN
  Administered 2022-09-04: 8.18 via INTRAVENOUS

## 2022-09-08 ENCOUNTER — Other Ambulatory Visit: Payer: Self-pay

## 2022-09-08 ENCOUNTER — Encounter: Payer: Self-pay | Admitting: Hematology and Oncology

## 2022-09-08 ENCOUNTER — Inpatient Hospital Stay: Payer: BC Managed Care – PPO | Admitting: Hematology and Oncology

## 2022-09-08 VITALS — BP 128/67 | HR 75 | Temp 98.1°F | Resp 18 | Ht 70.0 in | Wt 162.6 lb

## 2022-09-08 DIAGNOSIS — Z79899 Other long term (current) drug therapy: Secondary | ICD-10-CM | POA: Diagnosis not present

## 2022-09-08 DIAGNOSIS — R634 Abnormal weight loss: Secondary | ICD-10-CM

## 2022-09-08 DIAGNOSIS — C099 Malignant neoplasm of tonsil, unspecified: Secondary | ICD-10-CM | POA: Diagnosis not present

## 2022-09-08 DIAGNOSIS — R918 Other nonspecific abnormal finding of lung field: Secondary | ICD-10-CM | POA: Diagnosis not present

## 2022-09-08 NOTE — Assessment & Plan Note (Signed)
I have reviewed the imaging studies extensively There is mild abnormalities noted on the base of the tongue on the left side Per discussion with radiologist, I will proceed to order MRI of the head and neck with contrast for evaluation If indeed, we see abnormalities in that region, I will refer him back to ENT for biopsy Overall, I do not believe there is slight abnormalities at the base of tongue and lungs the cause of his recent weight loss I recommend the patient to continue to increase his nutritional intake as tolerated

## 2022-09-08 NOTE — Assessment & Plan Note (Signed)
The pulmonary infiltrates seen could be related to recent aspiration pneumonia He does not need repeat antibiotics Continue close observation for now

## 2022-09-08 NOTE — Assessment & Plan Note (Signed)
This is multifactorial, could be due to excessive workload at work and infection He will continue frequent small meals

## 2022-09-08 NOTE — Progress Notes (Signed)
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 extensively There is mild abnormalities noted on the base of the tongue on the left side Per discussion with radiologist, I will proceed to order MRI of the head and neck with contrast for evaluation If indeed, we see abnormalities in that region, I will refer him back to ENT for biopsy Overall, I do not believe there is slight abnormalities at the base of tongue and lungs the cause of his recent weight loss I recommend the patient to continue to increase his nutritional intake as tolerated  Weight loss This is multifactorial, could be due to excessive workload at work and infection He will continue frequent small meals  Pulmonary infiltrates The pulmonary infiltrates seen could be related to recent aspiration pneumonia He does not need repeat antibiotics Continue close observation for now  Orders Placed This Encounter  Procedures   MR Neck Soft Tissue W Contrast    Standing Status:   Future    Standing Expiration Date:   09/08/2023    Order Specific Question:   Reason for Exam (SYMPTOM  OR DIAGNOSIS REQUIRED)    Answer:   History of oropharyngeal/tonsil can, abnormal PET    Order Specific Question:   If indicated for the ordered procedure, I authorize the administration of contrast media per Radiology protocol    Answer:   Yes    Order Specific Question:   What is the patient's sedation requirement?    Answer:   No Sedation    Order Specific Question:   Does the patient have a pacemaker or implanted devices?    Answer:   No    Order Specific  Question:   Preferred imaging location?    Answer:   [facility] (table limit - 550 lbs)    All questions were answered. The patient knows to call the clinic with any problems, questions or concerns. The total time spent in the appointment was 30 minutes encounter with patients including review of chart and various tests results, discussions about plan of care and coordination of care plan   Artis Delay, MD 09/08/2022 2:51 PM  INTERVAL HISTORY: Please see below for problem oriented charting. he returns for follow-up to review imaging studies In the meantime, he complains of right sided hearing loss, as well as occasional cough He has gained back a bit of weight since the last visit We spent a lot of time reviewing multiple test results and discussed next plan of care  REVIEW OF SYSTEMS:   Constitutional: Denies fevers, chills Eyes: Denies blurriness of vision Ears, nose, mouth, throat, and face: Denies mucositis or sore throat Cardiovascular: Denies palpitation, chest discomfort or lower extremity swelling Gastrointestinal:  Denies nausea, heartburn or change in bowel habits Skin: Denies abnormal skin rashes Lymphatics: Denies new lymphadenopathy or easy bruising Neurological:Denies numbness, tingling or new weaknesses Behavioral/Psych: Mood is stable, no new changes  All other systems were reviewed with the patient and are negative.  I have reviewed the past medical history, past surgical history, social history and family history with the patient and they are unchanged from previous note.  ALLERGIES:  is allergic to  phenergan [promethazine hcl].  MEDICATIONS:  Current Outpatient Medications  Medication Sig Dispense Refill   acetaminophen (TYLENOL) 650 MG CR tablet Take 650 mg by mouth every 8 (eight) hours as needed for pain.     amLODipine (NORVASC) 10 MG tablet Take 1 tablet (10 mg total) by mouth daily. 30 tablet 1   Ascorbic Acid (VITAMIN C) 1000 MG tablet Take 200  mg by mouth daily.     aspirin EC 81 MG tablet Take 81 mg by mouth daily. Swallow whole.     Coenzyme Q10 (COQ-10) 200 MG CAPS Take 1 capsule by mouth daily.     Famotidine (PEPCID PO) Take 1 tablet by mouth as needed (gerd).     fenofibrate (TRICOR) 145 MG tablet Take 145 mg by mouth daily.      fexofenadine (ALLEGRA) 180 MG tablet Take 180 mg by mouth daily.     glucosamine-chondroitin 500-400 MG tablet Take 2 tablets by mouth every morning.     guaiFENesin (MUCINEX) 600 MG 12 hr tablet Take 600 mg by mouth 2 (two) times daily as needed for cough or to loosen phlegm.     levothyroxine (SYNTHROID) 125 MCG tablet Take 125 mcg by mouth daily before breakfast.     Multiple Vitamin (MULTIVITAMIN) capsule Take 1 capsule by mouth daily.     tamsulosin (FLOMAX) 0.4 MG CAPS capsule Take 0.4 mg by mouth daily.     No current facility-administered medications for this visit.    SUMMARY OF ONCOLOGIC HISTORY: Oncology History  Tonsil cancer (HCC)  02/07/2016 Imaging   Ct neck showed abnormal soft tissue within the left pharynx, extending from the left tonsillar pillar inferiorly to the level of the hyoid and filling the left vallecula. The appearance is most consistent with a neoplastic process. 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 Histologic sampling and direct visualization should be considered. Confluent adenopathy within the left cervical chain with areas of central hypoattenuation suggesting degrees of necrosis. This is most suggestive of metastatic spread. The areas of necrosis within the adenopathy combined with the above-described pharyngeal soft tissue mass are together suggestive of squamous cell carcinoma.   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 Borderline hypermetabolic small right station IIb lymph node. Several tiny lung nodules are observed, these are not hypermetabolic but are also below the threshold level for accurate characterization. Dedicated CT chest may be warranted to act as a baseline.   03/20/2016 Procedure   Placement of a right internal jugular approach power injectable Port-A-Cath. Fluoroscopic insertion of a 20-French pull-through gastrostomy tube.    03/24/2016 - 05/06/2016 Chemotherapy   He received high dose cisplatin; dose 2 and 3 with dose reduction due to side-effects   03/24/2016 - 05/15/2016 Radiation Therapy   Site/dose:  Left Tonsil and Bilateral Neck / 70 Gy in 35 fractions to gross disease, 63 Gy in 35 fractions to high risk nodal echelons, and 56 Gy in 35 fractions to intermediate risk nodal echelons.  Beams/energy:   Helical IMRT / 6 MV photons   09/04/2016 Imaging   1. Significant response to therapy of left oropharyngeal/palatine tonsil primary. Low-level residual hypermetabolism could be treatment related or represent residual disease. 2.  Resolution of cervical adenopathy. Low-level hypermetabolism within only 1 level 2 node remains. 3. No evidence of hypermetabolic extra cervical disease. 4. Age advanced coronary artery atherosclerosis. Recommend assessment of coronary risk factors and consideration of medical therapy. 5. Progressive sinus disease. 6. Bilateral pulmonary nodules. A right lower lobe nodule may be new. Below PET resolution. Recommend attention on follow-up.    11/24/2016 Imaging   1. Post radiation changes in the neck. Left tonsillar mass and malignant lymphadenopathy appear resolved by CT, with residual 7-8 mm short axis bilateral level 2 lymph nodes, rim calcified on the left. 2. Interval bilateral maxilla wisdom tooth extractions with new inflammation in the right maxillary sinus which appears  related to the tooth extraction site. Recommend ENT consultation. 3. Chest CT today is reported separately.   11/24/2016 Imaging   1. 3 mm posterior right lower lobe pulmonary nodule seen on the previous PET-CT has resolved completely in the interval. With 3 mm nodule in the posterior left lower lobe is unchanged. Continued attention on follow-up recommended. 2. No new or progressive findings on today's exam   01/30/2017 Procedure   Removal of percutaneous gastrostomy tube and port-a-cath.   11/23/2017 Imaging   Negative for recurrent tumor or adenopathy in the neck.  Improvement in post radiation changes involving the anterior neck.  Near complete opacification right maxillary sinus. This is likely odontogenic due to bony defect from prior third molar extraction.   11/23/2017 Imaging   1. Stable 3 mm posterior left lower lobe lung nodule is unchanged from previous exam. Favor benign abnormality. No new pulmonary nodules or evidence of metastatic disease identified. 2. Lad coronary artery atherosclerotic calcifications.   09/08/2022 PET scan   NM PET Image Restage (PS) Skull Base to Thigh (F-18 FDG)  Result Date: 09/08/2022 CLINICAL DATA:  Subsequent treatment strategy for head and neck cancer. EXAM: NUCLEAR MEDICINE PET SKULL BASE TO THIGH TECHNIQUE: 18.18 mCi F-18 FDG was injected intravenously. Full-ring PET imaging was performed from the skull base to thigh after the radiotracer. CT data was obtained and used for attenuation correction and anatomic localization. Fasting blood glucose: 100 mg/dl COMPARISON:  CT neck and chest from 03/13/2021. PET-CT from 09/04/2016 FINDINGS: Mediastinal blood pool activity: SUV max 2.37 Liver activity: SUV max NA NECK: Within the left side scratch set within the left posterior tongue there is a asymmetric focus of increased radiotracer uptake which has an SUV max of 4.33. This is compared with 1.61 on the contralateral side. No tracer avid cervical lymph nodes.  Incidental CT findings: Asymmetric opacification of the left maxillary sinus. CHEST: Within the left lower lobe there is multiple areas of increased radiotracer uptake which correspond to areas of increased peribronchovascular densities, consolidation, are and atelectasis. Extensive tree-in-bud nodularity identified throughout the right middle lobe, both lower lobes and basilar right upper lobe. Occlusion of the left lower lobe airways identified, suboptimally evaluated due to nondiagnostic technique but suspicious for mucoid impaction. Subpleural nodular density within the posterior right lower lobe measures 9 mm without significant tracer uptake, image 70/7. Well-defined solid nodule in the left lower lobe is unchanged from 2022 measuring 3 mm, image 69/7 and is favored to represent a benign nodule. No discrete well-defined tracer avid lung nodule identified highly suspicious for metastatic disease. Increased tracer uptake identified within mediastinal and hilar lymph nodes. For example, right paratracheal lymph node measures 1 cm, maintains a benign fatty hilum and has an SUV max of 3.19. Increased uptake within the left infrahilar region has  an SUV max 3.63. Incidental CT findings: Coronary artery calcifications. No pericardial effusion. ABDOMEN/PELVIS: No abnormal hypermetabolic activity within the liver, pancreas, adrenal glands, or spleen. No hypermetabolic lymph nodes in the abdomen or pelvis. Incidental CT findings: Prostate gland enlargement. SKELETON: No focal hypermetabolic activity to suggest skeletal metastasis. Incidental CT findings: None. IMPRESSION: 1. Asymmetric focus of increased radiotracer uptake is identified within the left posterior tongue. Etiology indeterminate. Cannot exclude recurrent tumor. Consider further evaluation with direct visualization and/or contrast enhanced soft tissue neck CT or MRI. 2. No tracer avid cervical lymph nodes. 3. Heterogeneous, multifocal asymmetric increased  uptake is identified within the left lower lobe. On the corresponding nondiagnostic CT images there is extensive tree-in-bud nodularity, thickening of the peribronchovascular interstitium, mucoid impaction, and atelectasis/airspace consolidation. Imaging findings are more likely to reflect a inflammatory/infectious process. Suspect chronic recurrent aspiration. Advise clinical correlation for signs/symptoms aspiration. Additionally, follow-up imaging with diagnostic CT of the chest in 3 months is recommended to ensure resolution and to exclude a central obstructing lesion which could also account for these changes. 4. Mildly tracer avid lymph nodes within the mediastinum and left hilum. In the setting of inflammation/infection these are likely reactive. Metastatic adenopathy is considered less favored but cannot be excluded with a high degree of certainty in this patient who has a history of known malignancy. Attention to these lymph nodes on follow-up imaging is advised. 5. Diffuse tree-in-bud nodularity is also identified within the basilar right upper lobe, right middle lobe and right lower lobe. This is compatible with an inflammatory/infectious bronchiolitis and may be seen with recurrent aspiration. 6. Coronary artery calcifications. 7. Prostate gland enlargement. 8. Asymmetric opacification of the left maxillary sinus. Correlate for any clinical signs or symptoms of sinusitis. Electronically Signed   By: Signa Kell M.D.   On: 09/08/2022 09:16        PHYSICAL EXAMINATION: ECOG PERFORMANCE STATUS: 0 - Asymptomatic  Vitals:   09/08/22 1251  BP: 128/67  Pulse: 75  Resp: 18  Temp: 98.1 F (36.7 C)  SpO2: 95%   Filed Weights   09/08/22 1251  Weight: 162 lb 9.6 oz (73.8 kg)    GENERAL:alert, no distress and comfortable NEURO: alert & oriented x 3 with fluent speech, no focal motor/sensory deficits  LABORATORY DATA:  I have reviewed the data as listed    Component Value Date/Time   NA  138 08/26/2022 1219   NA 140 02/16/2017 0841   K 4.3 08/26/2022 1219   K 4.1 02/16/2017 0841   CL 102 08/26/2022 1219   CO2 32 08/26/2022 1219   CO2 29 02/16/2017 0841   GLUCOSE 108 (H) 08/26/2022 1219   GLUCOSE 94 02/16/2017 0841   BUN 29 (H) 08/26/2022 1219   BUN 19.8 02/16/2017 0841   CREATININE 1.08 08/26/2022 1219   CREATININE 1.3 02/16/2017 0841   CALCIUM 10.0 08/26/2022 1219   CALCIUM 10.1 02/16/2017 0841   PROT 7.2 08/26/2022 1219   PROT 6.8 02/16/2017 0841   ALBUMIN 4.0 08/26/2022 1219   ALBUMIN 4.1 02/16/2017 0841   AST 23 08/26/2022 1219   AST 19 02/16/2017 0841   ALT 16 08/26/2022 1219   ALT 15 02/16/2017 0841   ALKPHOS 43 08/26/2022 1219   ALKPHOS 32 (L) 02/16/2017 0841   BILITOT 0.3 08/26/2022 1219   BILITOT 0.41 02/16/2017 0841   GFRNONAA >60 08/26/2022 1219   GFRAA >60 11/23/2017 1126    No results found for: "SPEP", "UPEP"  Lab Results  Component Value Date  WBC 8.3 08/26/2022   NEUTROABS 6.6 08/26/2022   HGB 11.7 (L) 08/26/2022   HCT 34.6 (L) 08/26/2022   MCV 84.4 08/26/2022   PLT 374 08/26/2022      Chemistry      Component Value Date/Time   NA 138 08/26/2022 1219   NA 140 02/16/2017 0841   K 4.3 08/26/2022 1219   K 4.1 02/16/2017 0841   CL 102 08/26/2022 1219   CO2 32 08/26/2022 1219   CO2 29 02/16/2017 0841   BUN 29 (H) 08/26/2022 1219   BUN 19.8 02/16/2017 0841   CREATININE 1.08 08/26/2022 1219   CREATININE 1.3 02/16/2017 0841      Component Value Date/Time   CALCIUM 10.0 08/26/2022 1219   CALCIUM 10.1 02/16/2017 0841   ALKPHOS 43 08/26/2022 1219   ALKPHOS 32 (L) 02/16/2017 0841   AST 23 08/26/2022 1219   AST 19 02/16/2017 0841   ALT 16 08/26/2022 1219   ALT 15 02/16/2017 0841   BILITOT 0.3 08/26/2022 1219   BILITOT 0.41 02/16/2017 0841       RADIOGRAPHIC STUDIES: I have reviewed multiple imaging studies with the patient and his wife I have personally reviewed the radiological images as listed and agreed with the  findings in the report. NM PET Image Restage (PS) Skull Base to Thigh (F-18 FDG)  Result Date: 09/08/2022 CLINICAL DATA:  Subsequent treatment strategy for head and neck cancer. EXAM: NUCLEAR MEDICINE PET SKULL BASE TO THIGH TECHNIQUE: 18.18 mCi F-18 FDG was injected intravenously. Full-ring PET imaging was performed from the skull base to thigh after the radiotracer. CT data was obtained and used for attenuation correction and anatomic localization. Fasting blood glucose: 100 mg/dl COMPARISON:  CT neck and chest from 03/13/2021. PET-CT from 09/04/2016 FINDINGS: Mediastinal blood pool activity: SUV max 2.37 Liver activity: SUV max NA NECK: Within the left side scratch set within the left posterior tongue there is a asymmetric focus of increased radiotracer uptake which has an SUV max of 4.33. This is compared with 1.61 on the contralateral side. No tracer avid cervical lymph nodes. Incidental CT findings: Asymmetric opacification of the left maxillary sinus. CHEST: Within the left lower lobe there is multiple areas of increased radiotracer uptake which correspond to areas of increased peribronchovascular densities, consolidation, are and atelectasis. Extensive tree-in-bud nodularity identified throughout the right middle lobe, both lower lobes and basilar right upper lobe. Occlusion of the left lower lobe airways identified, suboptimally evaluated due to nondiagnostic technique but suspicious for mucoid impaction. Subpleural nodular density within the posterior right lower lobe measures 9 mm without significant tracer uptake, image 70/7. Well-defined solid nodule in the left lower lobe is unchanged from 2022 measuring 3 mm, image 69/7 and is favored to represent a benign nodule. No discrete well-defined tracer avid lung nodule identified highly suspicious for metastatic disease. Increased tracer uptake identified within mediastinal and hilar lymph nodes. For example, right paratracheal lymph node measures 1 cm,  maintains a benign fatty hilum and has an SUV max of 3.19. Increased uptake within the left infrahilar region has an SUV max 3.63. Incidental CT findings: Coronary artery calcifications. No pericardial effusion. ABDOMEN/PELVIS: No abnormal hypermetabolic activity within the liver, pancreas, adrenal glands, or spleen. No hypermetabolic lymph nodes in the abdomen or pelvis. Incidental CT findings: Prostate gland enlargement. SKELETON: No focal hypermetabolic activity to suggest skeletal metastasis. Incidental CT findings: None. IMPRESSION: 1. Asymmetric focus of increased radiotracer uptake is identified within the left posterior tongue. Etiology indeterminate. Cannot exclude recurrent  tumor. Consider further evaluation with direct visualization and/or contrast enhanced soft tissue neck CT or MRI. 2. No tracer avid cervical lymph nodes. 3. Heterogeneous, multifocal asymmetric increased uptake is identified within the left lower lobe. On the corresponding nondiagnostic CT images there is extensive tree-in-bud nodularity, thickening of the peribronchovascular interstitium, mucoid impaction, and atelectasis/airspace consolidation. Imaging findings are more likely to reflect a inflammatory/infectious process. Suspect chronic recurrent aspiration. Advise clinical correlation for signs/symptoms aspiration. Additionally, follow-up imaging with diagnostic CT of the chest in 3 months is recommended to ensure resolution and to exclude a central obstructing lesion which could also account for these changes. 4. Mildly tracer avid lymph nodes within the mediastinum and left hilum. In the setting of inflammation/infection these are likely reactive. Metastatic adenopathy is considered less favored but cannot be excluded with a high degree of certainty in this patient who has a history of known malignancy. Attention to these lymph nodes on follow-up imaging is advised. 5. Diffuse tree-in-bud nodularity is also identified within the  basilar right upper lobe, right middle lobe and right lower lobe. This is compatible with an inflammatory/infectious bronchiolitis and may be seen with recurrent aspiration. 6. Coronary artery calcifications. 7. Prostate gland enlargement. 8. Asymmetric opacification of the left maxillary sinus. Correlate for any clinical signs or symptoms of sinusitis. Electronically Signed   By: Signa Kell M.D.   On: 09/08/2022 09:16

## 2022-09-09 ENCOUNTER — Telehealth: Payer: Self-pay

## 2022-09-09 ENCOUNTER — Other Ambulatory Visit (HOSPITAL_COMMUNITY): Payer: BC Managed Care – PPO

## 2022-09-09 NOTE — Telephone Encounter (Signed)
Received call from radiology stating pt's MRI should be ordered as w/wo contrast. Notified MD and she signed new order. Pt is scheduled for MRI 5/6 and MD f/u per MD 5/9 at 1300. Pt's wife is aware and verbalized agreement.

## 2022-09-10 DIAGNOSIS — G959 Disease of spinal cord, unspecified: Secondary | ICD-10-CM | POA: Diagnosis not present

## 2022-09-10 DIAGNOSIS — G6282 Radiation-induced polyneuropathy: Secondary | ICD-10-CM | POA: Diagnosis not present

## 2022-09-10 DIAGNOSIS — M5412 Radiculopathy, cervical region: Secondary | ICD-10-CM | POA: Diagnosis not present

## 2022-09-12 NOTE — Progress Notes (Signed)
Mr. Tinder presents for follow up of radiation completed 05/15/16 to his Tonsillar fossa.   Neck MRI on 09-15-22 IMPRESSION: 1. No evidence of recurrent disease or pathologic lymphadenopathy in the neck. Specifically, there is no suspicious finding in the tongue to correspond to the focus of ill-defined radiotracer uptake on the recent PET-CT. NIRADS 1. 2. Right mastoid effusion, nonspecific.   PET scan on 09-04-22 IMPRESSION: 1. Asymmetric focus of increased radiotracer uptake is identified within the left posterior tongue. Etiology indeterminate. Cannot exclude recurrent tumor. Consider further evaluation with direct visualization and/or contrast enhanced soft tissue neck CT or MRI. 2. No tracer avid cervical lymph nodes. 3. Heterogeneous, multifocal asymmetric increased uptake is identified within the left lower lobe. On the corresponding nondiagnostic CT images there is extensive tree-in-bud nodularity, thickening of the peribronchovascular interstitium, mucoid impaction, and atelectasis/airspace consolidation. Imaging findings are more likely to reflect a inflammatory/infectious process. Suspect chronic recurrent aspiration. Advise clinical correlation for signs/symptoms aspiration. Additionally, follow-up imaging with diagnostic CT of the chest in 3 months is recommended to ensure resolution and to exclude a central obstructing lesion which could also account for these changes. 4. Mildly tracer avid lymph nodes within the mediastinum and left hilum. In the setting of inflammation/infection these are likely reactive. Metastatic adenopathy is considered less favored but cannot be excluded with a high degree of certainty in this patient who has a history of known malignancy. Attention to these lymph nodes on follow-up imaging is advised. 5. Diffuse tree-in-bud nodularity is also identified within the basilar right upper lobe, right middle lobe and right lower lobe. This is  compatible with an inflammatory/infectious bronchiolitis and may be seen with recurrent aspiration. 6. Coronary artery calcifications. 7. Prostate gland enlargement. 8. Asymmetric opacification of the left maxillary sinus. Correlate for any clinical signs or symptoms of sinusitis.    Pain issues, if any: Denies new pain. Using a feeding tube?: No Weight changes, if any: He has lost about 15 pounds over the last 3 months. Swallowing issues, if any: He reports solid foods are getting stuck.  He is able to so liquids without difficulty.   Smoking or chewing tobacco?  Using fluoride toothpaste daily?  Last ENT visit was on: Dr. Jenne Pane 08/20/2022 Other notable issues, if any: He was seen by pulmonary and his PCP who noted he has aspiration pneumonia and was prescribed antibiotics.  Dr. Jenne Pane on 08-20-22 HPI:   Todd [name] is a 63 y.o. male who presents as a return patient with a chief complaint of dysphagia and weight loss. He worked with speech pathology since the last visit and feels that treatment was a bit helpful. He feels that his tongue weakness contributes to his swallow problem. He has been losing weight, 14 pounds since August, worse since February. He thought he was taking enough calories. He has increased his calorie intake lately. He was treated for aspiration pneumonia last week. He was evaluated by neurology who ordered an MRI and nerve testing.   Procedures:  Preoperative Diagnosis: Hoarseness, dysphagia Postoperative Diagnosis: same Procedure: Transnasal fiberoptic laryngoscopy Anesthesia: Topical with Afrin and Xylocaine Complications: None EBL: None  Informed consent was obtainted including a discussion of risks, benefits, and alternatives. The patient was placed in a sitting position and the nasal passages sprayed with a combination of Afrin and Xyolcaine. The fiberoptic laryngoscope was then placed through the nasal passage to view the pharynx and larynx. After completion,  the telescope was removed. Findings included normal nasal passages, no mass or abnormality  in the nasopharynx, and no mass or ulceration in the pharynx or larynx. Pyriform sinuses are open. Secretions are pooled somewhat in vallecula and pyriforms. There are radiation changes to the larynx and pharynx. There is a small bump on the right mid-cord in the striking zone. The vocal folds adduct and abduct symmetrically. There is good glottal closure. Muscle tension patterns are not present. Laryngeal edema is minimal.   Impression & Plans:  Devvin Zwicker is a 62 y.o. male with history of oropharyngeal cancer, dysphagia, weight loss, and right vocal fold lesion.  - We discussed his symptoms at length and reviewed his recent test results. Fiberoptic exam of the pharynx and larynx demonstrates post-radiation changes and a small right vocal fold lesion. I recommended observation of the vocal fold lesion and will have him follow-up in two months for repeat laryngoscopy. Surgical management of the lesion may be challenging with limited neck mobility. Regarding his dysphagia, there is not a therapeutic maneuver to help this problem aside from what is being done with speech pathology. Prognosis for improvement is poor. We touched on consideration of G-tube placement due to aspiration risk and lack of sufficient calorie intake. He will consider this and discuss further with Dr. Bertis Ruddy. I do not see any cause of his weight loss on exam of the pharynx and larynx aside from potentially insufficient calorie intake. Further work-up for weight loss may be considered by Dr. Bertis Ruddy. The left tongue findings remain concerning and curious but MR imaging of the brain and neck in 12/2021 was unremarkable for a cause. Surely this issue affects his swallow problem along with other post-radiation changes. I emphasized the need for him to get adequate calorie intake and suggested he may discuss a nutritionist consultation with Dr. Bertis Ruddy.

## 2022-09-15 ENCOUNTER — Ambulatory Visit (HOSPITAL_COMMUNITY)
Admission: RE | Admit: 2022-09-15 | Discharge: 2022-09-15 | Disposition: A | Discharge status: Home / Self Care | Payer: BC Managed Care – PPO | Source: Ambulatory Visit | Attending: Hematology and Oncology | Admitting: Hematology and Oncology

## 2022-09-15 DIAGNOSIS — C099 Malignant neoplasm of tonsil, unspecified: Secondary | ICD-10-CM | POA: Insufficient documentation

## 2022-09-15 MED ORDER — GADOBUTROL 1 MMOL/ML IV SOLN
7.0000 mL | Freq: Once | INTRAVENOUS | Status: AC | PRN
  Administered 2022-09-15: 7 mL via INTRAVENOUS

## 2022-09-17 DIAGNOSIS — M5412 Radiculopathy, cervical region: Secondary | ICD-10-CM | POA: Diagnosis not present

## 2022-09-17 DIAGNOSIS — M542 Cervicalgia: Secondary | ICD-10-CM | POA: Diagnosis not present

## 2022-09-18 ENCOUNTER — Encounter: Payer: Self-pay | Admitting: Hematology and Oncology

## 2022-09-18 ENCOUNTER — Inpatient Hospital Stay: Payer: BC Managed Care – PPO | Attending: Hematology and Oncology | Admitting: Hematology and Oncology

## 2022-09-18 ENCOUNTER — Other Ambulatory Visit: Payer: Self-pay

## 2022-09-18 VITALS — BP 125/75 | HR 84 | Temp 98.7°F | Resp 12 | Wt 162.4 lb

## 2022-09-18 DIAGNOSIS — R634 Abnormal weight loss: Secondary | ICD-10-CM | POA: Insufficient documentation

## 2022-09-18 DIAGNOSIS — C099 Malignant neoplasm of tonsil, unspecified: Secondary | ICD-10-CM | POA: Diagnosis not present

## 2022-09-18 DIAGNOSIS — Z85810 Personal history of malignant neoplasm of tongue: Secondary | ICD-10-CM | POA: Diagnosis not present

## 2022-09-18 DIAGNOSIS — Z79899 Other long term (current) drug therapy: Secondary | ICD-10-CM | POA: Diagnosis not present

## 2022-09-18 NOTE — Assessment & Plan Note (Signed)
His MRI of the tongue came back negative So far, I have completed extensive workup to rule out malignancy His weight loss is not related to recurrent cancer He does not need long-term follow-up with me

## 2022-09-18 NOTE — Assessment & Plan Note (Signed)
The patient is attempting to increase oral food intake as tolerated

## 2022-09-18 NOTE — Progress Notes (Signed)
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   All questions were answered. The patient knows to call the clinic with any problems, questions or concerns. The total time spent in the appointment was 20 minutes encounter with patients including review of chart and various tests results, discussions about plan of care and coordination of care plan   Artis Delay, MD 09/18/2022 2:22 PM  INTERVAL HISTORY: Please see below for problem oriented charting. he returns to review test results We discussed results of his MRI He is delighted to see no evidence of malignancy We discussed plan of care  REVIEW OF SYSTEMS:   Constitutional: Denies fevers, chills or abnormal weight loss Eyes: Denies blurriness of vision Ears, nose, mouth, throat, and face: Denies mucositis or sore throat Respiratory: Denies cough, dyspnea or wheezes Cardiovascular: Denies palpitation, chest discomfort or lower extremity swelling Gastrointestinal:  Denies nausea, heartburn or change in bowel habits Skin: Denies abnormal skin rashes Lymphatics: Denies new lymphadenopathy or easy bruising Neurological:Denies numbness, tingling or new weaknesses Behavioral/Psych: Mood is stable, no new changes  All other systems were  reviewed with the patient and are negative.  I have reviewed the past medical history, past surgical history, social history and family history with the patient and they are unchanged from previous note.  ALLERGIES:  is allergic to phenergan [promethazine hcl].  MEDICATIONS:  Current Outpatient Medications  Medication Sig Dispense Refill   acetaminophen (TYLENOL) 650 MG CR tablet Take 650 mg by mouth every 8 (eight) hours as needed for pain.     amLODipine (NORVASC) 10 MG tablet Take 1 tablet (10 mg total) by mouth daily. 30 tablet 1   Ascorbic Acid (VITAMIN C) 1000 MG tablet Take 200 mg by mouth daily.     aspirin EC 81 MG tablet Take 81 mg by mouth daily. Swallow whole.     Coenzyme Q10 (COQ-10) 200 MG CAPS Take 1 capsule by mouth daily.     Famotidine (PEPCID PO) Take 1 tablet by mouth as needed (gerd).     fenofibrate (TRICOR) 145 MG tablet Take 145 mg by mouth daily.      fexofenadine (ALLEGRA) 180 MG tablet Take 180 mg by mouth daily.     glucosamine-chondroitin 500-400 MG tablet Take 2 tablets by mouth every morning.     guaiFENesin (MUCINEX) 600 MG 12 hr tablet Take 600 mg by mouth 2 (two) times daily as needed for cough or to loosen phlegm.     levothyroxine (SYNTHROID) 125 MCG tablet Take 125 mcg by mouth daily before breakfast.     Multiple Vitamin (MULTIVITAMIN) capsule Take 1 capsule by mouth daily.     tamsulosin (FLOMAX) 0.4 MG CAPS capsule Take 0.4 mg by mouth daily.     No current facility-administered medications for this visit.    SUMMARY OF ONCOLOGIC  HISTORY: Oncology History  Tonsil cancer (HCC)  02/07/2016 Imaging   Ct neck showed abnormal soft tissue within the left pharynx, extending from the left tonsillar pillar inferiorly to the level of the hyoid and filling the left vallecula. The appearance is most consistent with a neoplastic process. 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 Histologic sampling and direct visualization should be  considered. Confluent adenopathy within the left cervical chain with areas of central hypoattenuation suggesting degrees of necrosis. This is most suggestive of metastatic spread. The areas of necrosis within the adenopathy combined with the above-described pharyngeal soft tissue mass are together suggestive of squamous cell carcinoma.   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 Borderline hypermetabolic small right station IIb lymph node. Several tiny lung nodules are observed, these are not hypermetabolic but are also below the threshold level for accurate characterization. Dedicated CT chest may be warranted to act as a baseline.   03/20/2016 Procedure   Placement of a right internal jugular approach power injectable Port-A-Cath. Fluoroscopic insertion of a 20-French pull-through gastrostomy tube.    03/24/2016 - 05/06/2016 Chemotherapy   He received high dose cisplatin; dose 2 and 3 with dose reduction due to side-effects   03/24/2016 - 05/15/2016 Radiation Therapy   Site/dose:  Left Tonsil and Bilateral Neck / 70 Gy in 35 fractions to gross disease, 63 Gy in 35 fractions to high risk nodal echelons, and 56 Gy in 35 fractions to intermediate risk nodal echelons.  Beams/energy:   Helical IMRT / 6 MV photons   09/04/2016 Imaging   1. Significant response to therapy of left oropharyngeal/palatine tonsil primary. Low-level residual hypermetabolism could be treatment related or represent residual disease. 2. Resolution of cervical adenopathy. Low-level hypermetabolism within only 1 level 2 node remains. 3. No evidence of hypermetabolic extra cervical disease. 4. Age advanced coronary artery  atherosclerosis. Recommend assessment of coronary risk factors and consideration of medical therapy. 5. Progressive sinus disease. 6. Bilateral pulmonary nodules. A right lower lobe nodule may be new. Below PET resolution. Recommend attention on follow-up.    11/24/2016 Imaging   1. Post radiation changes in the neck. Left tonsillar mass and malignant lymphadenopathy appear resolved by CT, with residual 7-8 mm short axis bilateral level 2 lymph nodes, rim calcified on the left. 2. Interval bilateral maxilla wisdom tooth extractions with new inflammation in the right maxillary sinus which appears related to the tooth extraction site. Recommend ENT consultation. 3. Chest CT today is reported separately.   11/24/2016 Imaging   1. 3 mm posterior right lower lobe pulmonary nodule seen on the previous PET-CT has resolved completely in the interval. With 3 mm nodule in the posterior left lower lobe is unchanged. Continued attention on follow-up recommended. 2. No new or progressive findings on today's exam   01/30/2017 Procedure   Removal of percutaneous gastrostomy tube and port-a-cath.   11/23/2017 Imaging   Negative for recurrent tumor or adenopathy in the neck.  Improvement in post radiation changes involving the anterior neck.  Near complete opacification right maxillary sinus. This is likely odontogenic due to bony defect from prior third molar extraction.   11/23/2017 Imaging   1. Stable 3 mm posterior left lower  lobe lung nodule is unchanged from previous exam. Favor benign abnormality. No new pulmonary nodules or evidence of metastatic disease identified. 2. Lad coronary artery atherosclerotic calcifications.   09/08/2022 PET scan   NM PET Image Restage (PS) Skull Base to Thigh (F-18 FDG)  Result Date: 09/08/2022 CLINICAL DATA:  Subsequent treatment strategy for head and neck cancer. EXAM: NUCLEAR MEDICINE PET SKULL BASE TO THIGH TECHNIQUE: 18.18 mCi F-18 FDG was injected intravenously.  Full-ring PET imaging was performed from the skull base to thigh after the radiotracer. CT data was obtained and used for attenuation correction and anatomic localization. Fasting blood glucose: 100 mg/dl COMPARISON:  CT neck and chest from 03/13/2021. PET-CT from 09/04/2016 FINDINGS: Mediastinal blood pool activity: SUV max 2.37 Liver activity: SUV max NA NECK: Within the left side scratch set within the left posterior tongue there is a asymmetric focus of increased radiotracer uptake which has an SUV max of 4.33. This is compared with 1.61 on the contralateral side. No tracer avid cervical lymph nodes. Incidental CT findings: Asymmetric opacification of the left maxillary sinus. CHEST: Within the left lower lobe there is multiple areas of increased radiotracer uptake which correspond to areas of increased peribronchovascular densities, consolidation, are and atelectasis. Extensive tree-in-bud nodularity identified throughout the right middle lobe, both lower lobes and basilar right upper lobe. Occlusion of the left lower lobe airways identified, suboptimally evaluated due to nondiagnostic technique but suspicious for mucoid impaction. Subpleural nodular density within the posterior right lower lobe measures 9 mm without significant tracer uptake, image 70/7. Well-defined solid nodule in the left lower lobe is unchanged from 2022 measuring 3 mm, image 69/7 and is favored to represent a benign nodule. No discrete well-defined tracer avid lung nodule identified highly suspicious for metastatic disease. Increased tracer uptake identified within mediastinal and hilar lymph nodes. For example, right paratracheal lymph node measures 1 cm, maintains a benign fatty hilum and has an SUV max of 3.19. Increased uptake within the left infrahilar region has an SUV max 3.63. Incidental CT findings: Coronary artery calcifications. No pericardial effusion. ABDOMEN/PELVIS: No abnormal hypermetabolic activity within the liver,  pancreas, adrenal glands, or spleen. No hypermetabolic lymph nodes in the abdomen or pelvis. Incidental CT findings: Prostate gland enlargement. SKELETON: No focal hypermetabolic activity to suggest skeletal metastasis. Incidental CT findings: None. IMPRESSION: 1. Asymmetric focus of increased radiotracer uptake is identified within the left posterior tongue. Etiology indeterminate. Cannot exclude recurrent tumor. Consider further evaluation with direct visualization and/or contrast enhanced soft tissue neck CT or MRI. 2. No tracer avid cervical lymph nodes. 3. Heterogeneous, multifocal asymmetric increased uptake is identified within the left lower lobe. On the corresponding nondiagnostic CT images there is extensive tree-in-bud nodularity, thickening of the peribronchovascular interstitium, mucoid impaction, and atelectasis/airspace consolidation. Imaging findings are more likely to reflect a inflammatory/infectious process. Suspect chronic recurrent aspiration. Advise clinical correlation for signs/symptoms aspiration. Additionally, follow-up imaging with diagnostic CT of the chest in 3 months is recommended to ensure resolution and to exclude a central obstructing lesion which could also account for these changes. 4. Mildly tracer avid lymph nodes within the mediastinum and left hilum. In the setting of inflammation/infection these are likely reactive. Metastatic adenopathy is considered less favored but cannot be excluded with a high degree of certainty in this patient who has a history of known malignancy. Attention to these lymph nodes on follow-up imaging is advised. 5. Diffuse tree-in-bud nodularity is also identified within the basilar right upper lobe, right middle lobe and right  lower lobe. This is compatible with an inflammatory/infectious bronchiolitis and may be seen with recurrent aspiration. 6. Coronary artery calcifications. 7. Prostate gland enlargement. 8. Asymmetric opacification of the left  maxillary sinus. Correlate for any clinical signs or symptoms of sinusitis. Electronically Signed   By: Signa Kell M.D.   On: 09/08/2022 09:16      09/18/2022 Imaging   1. No evidence of recurrent disease or pathologic lymphadenopathy in the neck. Specifically, there is no suspicious finding in the tongue to correspond to the focus of ill-defined radiotracer uptake on the recent PET-CT. NIRADS 1. 2. Right mastoid effusion, nonspecific.       PHYSICAL EXAMINATION: ECOG PERFORMANCE STATUS: 1 - Symptomatic but completely ambulatory  Vitals:   09/18/22 1316  BP: 125/75  Pulse: 84  Resp: 12  Temp: 98.7 F (37.1 C)  SpO2: 93%   Filed Weights   09/18/22 1316  Weight: 162 lb 6.4 oz (73.7 kg)    GENERAL:alert, no distress and comfortable  NEURO: alert & oriented x 3 with fluent speech, no focal motor/sensory deficits  LABORATORY DATA:  I have reviewed the data as listed    Component Value Date/Time   NA 138 08/26/2022 1219   NA 140 02/16/2017 0841   K 4.3 08/26/2022 1219   K 4.1 02/16/2017 0841   CL 102 08/26/2022 1219   CO2 32 08/26/2022 1219   CO2 29 02/16/2017 0841   GLUCOSE 108 (H) 08/26/2022 1219   GLUCOSE 94 02/16/2017 0841   BUN 29 (H) 08/26/2022 1219   BUN 19.8 02/16/2017 0841   CREATININE 1.08 08/26/2022 1219   CREATININE 1.3 02/16/2017 0841   CALCIUM 10.0 08/26/2022 1219   CALCIUM 10.1 02/16/2017 0841   PROT 7.2 08/26/2022 1219   PROT 6.8 02/16/2017 0841   ALBUMIN 4.0 08/26/2022 1219   ALBUMIN 4.1 02/16/2017 0841   AST 23 08/26/2022 1219   AST 19 02/16/2017 0841   ALT 16 08/26/2022 1219   ALT 15 02/16/2017 0841   ALKPHOS 43 08/26/2022 1219   ALKPHOS 32 (L) 02/16/2017 0841   BILITOT 0.3 08/26/2022 1219   BILITOT 0.41 02/16/2017 0841   GFRNONAA >60 08/26/2022 1219   GFRAA >60 11/23/2017 1126    No results found for: "SPEP", "UPEP"  Lab Results  Component Value Date   WBC 8.3 08/26/2022   NEUTROABS 6.6 08/26/2022   HGB 11.7 (L) 08/26/2022   HCT  34.6 (L) 08/26/2022   MCV 84.4 08/26/2022   PLT 374 08/26/2022      Chemistry      Component Value Date/Time   NA 138 08/26/2022 1219   NA 140 02/16/2017 0841   K 4.3 08/26/2022 1219   K 4.1 02/16/2017 0841   CL 102 08/26/2022 1219   CO2 32 08/26/2022 1219   CO2 29 02/16/2017 0841   BUN 29 (H) 08/26/2022 1219   BUN 19.8 02/16/2017 0841   CREATININE 1.08 08/26/2022 1219   CREATININE 1.3 02/16/2017 0841      Component Value Date/Time   CALCIUM 10.0 08/26/2022 1219   CALCIUM 10.1 02/16/2017 0841   ALKPHOS 43 08/26/2022 1219   ALKPHOS 32 (L) 02/16/2017 0841   AST 23 08/26/2022 1219   AST 19 02/16/2017 0841   ALT 16 08/26/2022 1219   ALT 15 02/16/2017 0841   BILITOT 0.3 08/26/2022 1219   BILITOT 0.41 02/16/2017 0841       RADIOGRAPHIC STUDIES: I have personally reviewed the radiological images as listed and agreed with the findings  in the report. MR NECK SOFT TISSUE ONLY W WO CONTRAST  Result Date: 09/18/2022 CLINICAL DATA:  Tonsil cancer. EXAM: MRI OF THE NECK WITH CONTRAST TECHNIQUE: Multiplanar, multisequence MR imaging was performed following the administration of intravenous contrast. CONTRAST:  7mL GADAVIST GADOBUTROL 1 MMOL/ML IV SOLN COMPARISON:  PET-CT 09/04/2022, CT neck 03/13/2021 FINDINGS: Pharynx and larynx: The nasal cavity and nasopharynx are unremarkable. There is no abnormality in the tongue to correspond to the area of ill-defined radiotracer uptake seen on the recent PET-CT. There is no mass lesion or abnormal enhancement. There is no evidence of mass lesion or abnormal enhancement in the region of either tonsil. The oropharynx is unremarkable. Ill-defined T2 hyperintensity in the left parapharyngeal space with mild non masslike enhancement likely reflects posttreatment change, corresponding to ill-defined stranding seen on the CT neck going back to 2019. The hypopharynx and larynx are unremarkable. The vocal folds are normal in appearance There is no  retropharyngeal fluid collection.  The airway is patent. Salivary glands: The parotid and submandibular glands are unremarkable. Thyroid: Unremarkable. Lymph nodes: There is no pathologic lymphadenopathy in the neck. Vascular: The major flow voids are normal. Limited intracranial: Unremarkable. Visualized orbits: Unremarkable. Mastoids and visualized paranasal sinuses: There is mucosal thickening in the maxillary sinuses, left worse than right. There is a new right mastoid effusion. Skeleton: There is multilevel degenerative change of the cervical spine with disc space narrowing most advanced at C4-C5. There is moderate spinal canal stenosis at C2-C3 through C4-C5. Upper chest: The imaged lung apices are grossly unremarkable. Other: None. IMPRESSION: 1. No evidence of recurrent disease or pathologic lymphadenopathy in the neck. Specifically, there is no suspicious finding in the tongue to correspond to the focus of ill-defined radiotracer uptake on the recent PET-CT. NIRADS 1. 2. Right mastoid effusion, nonspecific. Electronically Signed   By: Lesia Hausen M.D.   On: 09/18/2022 09:12   NM PET Image Restage (PS) Skull Base to Thigh (F-18 FDG)  Result Date: 09/08/2022 CLINICAL DATA:  Subsequent treatment strategy for head and neck cancer. EXAM: NUCLEAR MEDICINE PET SKULL BASE TO THIGH TECHNIQUE: 18.18 mCi F-18 FDG was injected intravenously. Full-ring PET imaging was performed from the skull base to thigh after the radiotracer. CT data was obtained and used for attenuation correction and anatomic localization. Fasting blood glucose: 100 mg/dl COMPARISON:  CT neck and chest from 03/13/2021. PET-CT from 09/04/2016 FINDINGS: Mediastinal blood pool activity: SUV max 2.37 Liver activity: SUV max NA NECK: Within the left side scratch set within the left posterior tongue there is a asymmetric focus of increased radiotracer uptake which has an SUV max of 4.33. This is compared with 1.61 on the contralateral side. No  tracer avid cervical lymph nodes. Incidental CT findings: Asymmetric opacification of the left maxillary sinus. CHEST: Within the left lower lobe there is multiple areas of increased radiotracer uptake which correspond to areas of increased peribronchovascular densities, consolidation, are and atelectasis. Extensive tree-in-bud nodularity identified throughout the right middle lobe, both lower lobes and basilar right upper lobe. Occlusion of the left lower lobe airways identified, suboptimally evaluated due to nondiagnostic technique but suspicious for mucoid impaction. Subpleural nodular density within the posterior right lower lobe measures 9 mm without significant tracer uptake, image 70/7. Well-defined solid nodule in the left lower lobe is unchanged from 2022 measuring 3 mm, image 69/7 and is favored to represent a benign nodule. No discrete well-defined tracer avid lung nodule identified highly suspicious for metastatic disease. Increased tracer uptake identified within  mediastinal and hilar lymph nodes. For example, right paratracheal lymph node measures 1 cm, maintains a benign fatty hilum and has an SUV max of 3.19. Increased uptake within the left infrahilar region has an SUV max 3.63. Incidental CT findings: Coronary artery calcifications. No pericardial effusion. ABDOMEN/PELVIS: No abnormal hypermetabolic activity within the liver, pancreas, adrenal glands, or spleen. No hypermetabolic lymph nodes in the abdomen or pelvis. Incidental CT findings: Prostate gland enlargement. SKELETON: No focal hypermetabolic activity to suggest skeletal metastasis. Incidental CT findings: None. IMPRESSION: 1. Asymmetric focus of increased radiotracer uptake is identified within the left posterior tongue. Etiology indeterminate. Cannot exclude recurrent tumor. Consider further evaluation with direct visualization and/or contrast enhanced soft tissue neck CT or MRI. 2. No tracer avid cervical lymph nodes. 3. Heterogeneous,  multifocal asymmetric increased uptake is identified within the left lower lobe. On the corresponding nondiagnostic CT images there is extensive tree-in-bud nodularity, thickening of the peribronchovascular interstitium, mucoid impaction, and atelectasis/airspace consolidation. Imaging findings are more likely to reflect a inflammatory/infectious process. Suspect chronic recurrent aspiration. Advise clinical correlation for signs/symptoms aspiration. Additionally, follow-up imaging with diagnostic CT of the chest in 3 months is recommended to ensure resolution and to exclude a central obstructing lesion which could also account for these changes. 4. Mildly tracer avid lymph nodes within the mediastinum and left hilum. In the setting of inflammation/infection these are likely reactive. Metastatic adenopathy is considered less favored but cannot be excluded with a high degree of certainty in this patient who has a history of known malignancy. Attention to these lymph nodes on follow-up imaging is advised. 5. Diffuse tree-in-bud nodularity is also identified within the basilar right upper lobe, right middle lobe and right lower lobe. This is compatible with an inflammatory/infectious bronchiolitis and may be seen with recurrent aspiration. 6. Coronary artery calcifications. 7. Prostate gland enlargement. 8. Asymmetric opacification of the left maxillary sinus. Correlate for any clinical signs or symptoms of sinusitis. Electronically Signed   By: Signa Kell M.D.   On: 09/08/2022 09:16

## 2022-09-23 ENCOUNTER — Other Ambulatory Visit (HOSPITAL_BASED_OUTPATIENT_CLINIC_OR_DEPARTMENT_OTHER): Payer: Self-pay | Admitting: Registered Nurse

## 2022-09-23 DIAGNOSIS — C099 Malignant neoplasm of tonsil, unspecified: Secondary | ICD-10-CM | POA: Diagnosis not present

## 2022-09-23 DIAGNOSIS — R051 Acute cough: Secondary | ICD-10-CM | POA: Diagnosis not present

## 2022-09-23 DIAGNOSIS — D72825 Bandemia: Secondary | ICD-10-CM | POA: Diagnosis not present

## 2022-09-23 DIAGNOSIS — R942 Abnormal results of pulmonary function studies: Secondary | ICD-10-CM

## 2022-09-23 DIAGNOSIS — N1831 Chronic kidney disease, stage 3a: Secondary | ICD-10-CM | POA: Diagnosis not present

## 2022-09-23 DIAGNOSIS — R509 Fever, unspecified: Secondary | ICD-10-CM | POA: Diagnosis not present

## 2022-09-23 DIAGNOSIS — Z1152 Encounter for screening for COVID-19: Secondary | ICD-10-CM | POA: Diagnosis not present

## 2022-09-23 DIAGNOSIS — J189 Pneumonia, unspecified organism: Secondary | ICD-10-CM | POA: Diagnosis not present

## 2022-09-23 DIAGNOSIS — I129 Hypertensive chronic kidney disease with stage 1 through stage 4 chronic kidney disease, or unspecified chronic kidney disease: Secondary | ICD-10-CM | POA: Diagnosis not present

## 2022-09-24 ENCOUNTER — Telehealth: Payer: Self-pay | Admitting: *Deleted

## 2022-09-24 ENCOUNTER — Ambulatory Visit (HOSPITAL_COMMUNITY)
Admission: RE | Admit: 2022-09-24 | Discharge: 2022-09-24 | Disposition: A | Discharge status: Home / Self Care | Payer: BC Managed Care – PPO | Source: Ambulatory Visit | Attending: Registered Nurse | Admitting: Registered Nurse

## 2022-09-24 DIAGNOSIS — C099 Malignant neoplasm of tonsil, unspecified: Secondary | ICD-10-CM | POA: Diagnosis not present

## 2022-09-24 DIAGNOSIS — J181 Lobar pneumonia, unspecified organism: Secondary | ICD-10-CM | POA: Diagnosis not present

## 2022-09-24 DIAGNOSIS — R942 Abnormal results of pulmonary function studies: Secondary | ICD-10-CM | POA: Diagnosis not present

## 2022-09-24 DIAGNOSIS — D72825 Bandemia: Secondary | ICD-10-CM | POA: Diagnosis not present

## 2022-09-24 DIAGNOSIS — J189 Pneumonia, unspecified organism: Secondary | ICD-10-CM | POA: Insufficient documentation

## 2022-09-24 DIAGNOSIS — R918 Other nonspecific abnormal finding of lung field: Secondary | ICD-10-CM | POA: Diagnosis not present

## 2022-09-24 MED ORDER — IOHEXOL 300 MG/ML  SOLN
100.0000 mL | Freq: Once | INTRAMUSCULAR | Status: AC | PRN
  Administered 2022-09-24: 75 mL via INTRAVENOUS

## 2022-09-24 MED ORDER — SODIUM CHLORIDE (PF) 0.9 % IJ SOLN
INTRAMUSCULAR | Status: AC
  Filled 2022-09-24: qty 50

## 2022-09-24 NOTE — Telephone Encounter (Signed)
Received VM from [place] at [facility] - stating pt was seen by Jarome Lamas NP with CT done today.  " CT shows abnormalities with noted worsening fungal disease but cannot rule out metastatic disease  Pt is on two antibiotics and we will be seeing him again here for follow up "  Jarome Lamas NP  wanted Dr Bertis Ruddy to be aware of abnormal readings.   Note pt does not have follow up appointment with Dr Bertis Ruddy but is seeing Dr Basilio Cairo on 5/17.  This note will be forwarded to both above providers for review for any further recommendations.  Return call number for [facility] NP is [phone].

## 2022-09-25 NOTE — Telephone Encounter (Signed)
Called Todd [name] regarding call from NP at [facility] yesterday and concern for CT results. Given message from Todd [name]. He verbalized and appreciated the call. Ask if he would like a referral to pulmonary. He thinks that Todd Lamas NP  with [facility] did a referral already. Told him I would call Todd Lamas NP office and give message from Todd [name] and see if a referral has been sent and if not I would send referral.   Called and spoke with office staff at [facility] given message from Todd [name]. Told them I spoke with Todd [name] this morning and he was given below message. [facility] already sent a pulmonary referral. [facility] will call the office back with questions.

## 2022-09-26 ENCOUNTER — Encounter: Payer: Self-pay | Admitting: Internal Medicine

## 2022-09-26 ENCOUNTER — Ambulatory Visit
Admission: RE | Admit: 2022-09-26 | Discharge: 2022-09-26 | Disposition: A | Discharge status: Home / Self Care | Payer: BC Managed Care – PPO | Source: Ambulatory Visit | Attending: Radiation Oncology | Admitting: Radiation Oncology

## 2022-09-26 ENCOUNTER — Ambulatory Visit (INDEPENDENT_AMBULATORY_CARE_PROVIDER_SITE_OTHER): Payer: BC Managed Care – PPO | Admitting: Internal Medicine

## 2022-09-26 ENCOUNTER — Encounter: Payer: Self-pay | Admitting: Radiation Oncology

## 2022-09-26 VITALS — BP 116/85 | HR 73 | Temp 97.7°F | Resp 20 | Wt 153.0 lb

## 2022-09-26 VITALS — BP 120/78 | HR 85 | Temp 98.2°F | Ht 70.0 in | Wt 152.8 lb

## 2022-09-26 DIAGNOSIS — Z7982 Long term (current) use of aspirin: Secondary | ICD-10-CM | POA: Insufficient documentation

## 2022-09-26 DIAGNOSIS — C090 Malignant neoplasm of tonsillar fossa: Secondary | ICD-10-CM | POA: Diagnosis not present

## 2022-09-26 DIAGNOSIS — Z79899 Other long term (current) drug therapy: Secondary | ICD-10-CM | POA: Insufficient documentation

## 2022-09-26 DIAGNOSIS — R918 Other nonspecific abnormal finding of lung field: Secondary | ICD-10-CM | POA: Insufficient documentation

## 2022-09-26 DIAGNOSIS — C099 Malignant neoplasm of tonsil, unspecified: Secondary | ICD-10-CM

## 2022-09-26 DIAGNOSIS — Z79890 Hormone replacement therapy (postmenopausal): Secondary | ICD-10-CM | POA: Insufficient documentation

## 2022-09-26 DIAGNOSIS — I2510 Atherosclerotic heart disease of native coronary artery without angina pectoris: Secondary | ICD-10-CM | POA: Insufficient documentation

## 2022-09-26 DIAGNOSIS — Z85819 Personal history of malignant neoplasm of unspecified site of lip, oral cavity, and pharynx: Secondary | ICD-10-CM | POA: Diagnosis not present

## 2022-09-26 DIAGNOSIS — Z923 Personal history of irradiation: Secondary | ICD-10-CM | POA: Insufficient documentation

## 2022-09-26 DIAGNOSIS — N400 Enlarged prostate without lower urinary tract symptoms: Secondary | ICD-10-CM | POA: Diagnosis not present

## 2022-09-26 DIAGNOSIS — R634 Abnormal weight loss: Secondary | ICD-10-CM | POA: Diagnosis not present

## 2022-09-26 DIAGNOSIS — J690 Pneumonitis due to inhalation of food and vomit: Secondary | ICD-10-CM | POA: Diagnosis not present

## 2022-09-26 NOTE — Patient Instructions (Addendum)
Please follow up with me as needed. I am happy to see you back if your symptoms aren't improving, or if there are concerns about how to treat your recurrent aspiration.   Overall I think the recurrent aspiration is what is causing your CT scan findings. Please follow up with speech therapy and Dr. Basilio Cairo as we discussed.

## 2022-09-26 NOTE — Progress Notes (Signed)
Radiation Oncology         [phone] ________________________________  Name: Todd [name] MRN: [id number]  Date: 09/26/2022  DOB: May 04, 1961  Follow-Up Visit Note  Outpatient  CC: Tisovec, Adelfa Koh, MD  Tisovec, Adelfa Koh, MD  Diagnosis and Prior Radiotherapy:    ICD-10-CM   1. 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  Todd [name] presents for follow up of radiation completed 05/15/16 to his Tonsillar fossa/oropharynx.  Recent PET and MRI below were reviewed at tumor board, NED.  See below.  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  He is also losing weight.  Neck MRI on 09-15-22 IMPRESSION: 1. No evidence of recurrent disease or pathologic lymphadenopathy in the neck. Specifically, there is no suspicious finding in the tongue to correspond to the focus of ill-defined radiotracer uptake on the recent PET-CT. NIRADS 1. 2. Right mastoid effusion, nonspecific.   PET scan on 09-04-22 IMPRESSION: 1. Asymmetric  focus of increased radiotracer uptake is identified within the left posterior tongue. Etiology indeterminate. Cannot exclude recurrent tumor. Consider further evaluation with direct visualization and/or contrast enhanced soft tissue neck CT or MRI. 2. No tracer avid cervical lymph nodes. 3. Heterogeneous, multifocal asymmetric increased uptake is identified within the left lower lobe. On the corresponding nondiagnostic CT images there is extensive tree-in-bud nodularity, thickening of the peribronchovascular interstitium, mucoid impaction, and atelectasis/airspace consolidation. Imaging findings are more likely to reflect a inflammatory/infectious process. Suspect chronic recurrent aspiration. Advise clinical correlation for signs/symptoms aspiration. Additionally, follow-up imaging with diagnostic CT of the chest in 3 months is recommended to ensure resolution and to exclude a central obstructing lesion which could also account for these changes. 4. Mildly tracer avid lymph nodes within the mediastinum and left hilum. In the setting of inflammation/infection these are likely reactive. Metastatic adenopathy is considered less favored but cannot be excluded with a high degree of certainty in this patient who has a history of known malignancy. Attention to these lymph nodes on follow-up imaging is advised. 5. Diffuse tree-in-bud nodularity is also identified within the basilar right upper lobe, right middle lobe and right lower lobe. This is compatible with an inflammatory/infectious bronchiolitis and may be seen with recurrent aspiration. 6. Coronary artery calcifications. 7. Prostate gland enlargement. 8. Asymmetric opacification of the left maxillary sinus. Correlate for any clinical signs or symptoms of sinusitis.    Pain issues, if any: Denies new pain. Using a feeding tube?: No Weight changes, if any: He has lost about 15 pounds over the last 3 months. Swallowing issues, if  any: He reports solid foods  are getting stuck.  He is able to so liquids without difficulty.   Smoking or chewing tobacco?  Using fluoride toothpaste daily?  Last ENT visit was on: Dr. Jenne Pane 08/20/2022 Other notable issues, if any: He was seen by pulmonary and his PCP who noted he has aspiration pneumonia and was prescribed antibiotics.                         ALLERGIES:  is allergic to phenergan [promethazine hcl] and sulfamethoxazole-trimethoprim.  Meds: Current Outpatient Medications  Medication Sig Dispense Refill   acetaminophen (TYLENOL) 650 MG CR tablet Take 650 mg by mouth every 8 (eight) hours as needed for pain.     albuterol (VENTOLIN HFA) 108 (90 Base) MCG/ACT inhaler      amLODipine (NORVASC) 10 MG tablet Take 1 tablet (10 mg total) by mouth daily. 30 tablet 1   Ascorbic Acid (VITAMIN C) 1000 MG tablet Take 200 mg by mouth daily.     aspirin EC 81 MG tablet Take 81 mg by mouth daily. Swallow whole.     cefdinir (OMNICEF) 300 MG capsule Take by mouth.     Coenzyme Q10 (COQ-10) 200 MG CAPS Take 1 capsule by mouth daily.     doxycycline (VIBRA-TABS) 100 MG tablet Take 100 mg by mouth 2 (two) times daily.     Famotidine (PEPCID PO) Take 1 tablet by mouth as needed (gerd).     fenofibrate (TRICOR) 145 MG tablet Take 145 mg by mouth daily.      fexofenadine (ALLEGRA) 180 MG tablet Take 180 mg by mouth daily.     glucosamine-chondroitin 500-400 MG tablet Take 2 tablets by mouth every morning.     guaiFENesin (MUCINEX) 600 MG 12 hr tablet Take 600 mg by mouth 2 (two) times daily as needed for cough or to loosen phlegm.     levothyroxine (SYNTHROID) 125 MCG tablet Take 125 mcg by mouth daily before breakfast.     Multiple Vitamin (MULTIVITAMIN) capsule Take 1 capsule by mouth daily.     tamsulosin (FLOMAX) 0.4 MG CAPS capsule Take 0.4 mg by mouth daily.     No current facility-administered medications for this encounter.    Physical Findings: The patient is in no acute distress.  Patient is alert and oriented.  weight is 153 lb (69.4 kg). His temperature is 97.7 F (36.5 C). His blood pressure is 116/85 and his pulse is 73. His respiration is 20 and oxygen saturation is 94%. .     General: Alert and oriented, in no acute distress, somewhat hoarse HEENT: Head is normocephalic. Extraocular movements are intact. Oropharynx is clear of tumor but the posterior left tongue is notable for fasciculations and atrophy and his tongue deviates to the left Neck: Neck is supple, no palpable cervical or supraclavicular lymphadenopathy.  Fibrosis noted bilaterally. Heart: Regular in rate and rhythm with no murmurs, rubs, or gallops. Chest: Clear to auscultation bilaterally, with no rhonchi, wheezes, or rales. Abdomen: Soft, nontender, nondistended, with no rigidity or guarding. Extremities: No cyanosis or edema. Lymphatics: see Neck Exam Skin: No concerning lesions. Musculoskeletal: Grossly symmetric strength and muscle tone throughout. Neurologic: Cranial nerves II through XII are grossly intact. No obvious focalities. Speech is fluent. Coordination is intact. Psychiatric: Judgment and insight are intact. Affect is appropriate.  ECOG=1  Lab Findings: Lab Results  Component Value Date   WBC 8.3 08/26/2022   HGB 11.7 (L) 08/26/2022   HCT 34.6 (L) 08/26/2022  MCV 84.4 08/26/2022   PLT 374 08/26/2022    Radiographic Findings: CT CHEST W CONTRAST  Result Date: 09/24/2022 CLINICAL DATA:  Pneumonia, tonsillar cancer.  Abnormal PET. EXAM: CT CHEST WITH CONTRAST TECHNIQUE: Multidetector CT imaging of the chest was performed during intravenous contrast administration. RADIATION DOSE REDUCTION: This exam was performed according to the departmental dose-optimization program which includes automated exposure control, adjustment of the mA and/or kV according to patient size and/or use of iterative reconstruction technique. CONTRAST:  75mL OMNIPAQUE IOHEXOL 300 MG/ML  SOLN COMPARISON:  MR  neck 09/15/2022, PET 09/04/2022, CT chest 03/13/2021. FINDINGS: Cardiovascular: Coronary artery calcification. Heart is mildly enlarged. No pericardial effusion. Mediastinum/Nodes: Mediastinal lymph nodes measure up to 12 mm in the low right paratracheal station, unchanged from 09/04/2022. Bihilar lymph nodes measure up to 10 mm on the right. No axillary adenopathy. Esophagus is grossly unremarkable. Lungs/Pleura: Chronic apical subpleural ground-glass, possibly radiation related. Worsening peribronchovascular ground-glass, nodularity and consolidation throughout the lungs, worst in the left lower lobe. Scattered septal thickening. No pleural fluid. Debris is seen in the bronchus intermedius and left mainstem bronchus. Upper Abdomen: Probable right hepatic lobe cyst. Visualized portion of the liver is otherwise unremarkable. Adrenal glands and visualized portion of the right kidney are unremarkable. Low-attenuation lesion in the upper pole left kidney, too small to characterize. No specific follow-up necessary. Visualized portions of the spleen, pancreas, stomach and bowel are grossly unremarkable. No upper abdominal adenopathy. Musculoskeletal: Degenerative changes in the spine. No worrisome lytic or sclerotic lesions. IMPRESSION: 1. Progressive diffuse peribronchovascular ground-glass, nodularity and consolidation with scattered septal thickening, findings which may be due to a worsening atypical/fungal infectious process. Lymphangitic carcinomatosis is not excluded. 2. Borderline enlarged mediastinal lymph nodes, possibly reactive. Recommend continued attention on follow-up as malignancy cannot be excluded. 3. Coronary artery calcification. Electronically Signed   By: Leanna Battles M.D.   On: 09/24/2022 14:23   MR NECK SOFT TISSUE ONLY W WO CONTRAST  Result Date: 09/18/2022 CLINICAL DATA:  Tonsil cancer. EXAM: MRI OF THE NECK WITH CONTRAST TECHNIQUE: Multiplanar, multisequence MR imaging was performed  following the administration of intravenous contrast. CONTRAST:  7mL GADAVIST GADOBUTROL 1 MMOL/ML IV SOLN COMPARISON:  PET-CT 09/04/2022, CT neck 03/13/2021 FINDINGS: Pharynx and larynx: The nasal cavity and nasopharynx are unremarkable. There is no abnormality in the tongue to correspond to the area of ill-defined radiotracer uptake seen on the recent PET-CT. There is no mass lesion or abnormal enhancement. There is no evidence of mass lesion or abnormal enhancement in the region of either tonsil. The oropharynx is unremarkable. Ill-defined T2 hyperintensity in the left parapharyngeal space with mild non masslike enhancement likely reflects posttreatment change, corresponding to ill-defined stranding seen on the CT neck going back to 2019. The hypopharynx and larynx are unremarkable. The vocal folds are normal in appearance There is no retropharyngeal fluid collection.  The airway is patent. Salivary glands: The parotid and submandibular glands are unremarkable. Thyroid: Unremarkable. Lymph nodes: There is no pathologic lymphadenopathy in the neck. Vascular: The major flow voids are normal. Limited intracranial: Unremarkable. Visualized orbits: Unremarkable. Mastoids and visualized paranasal sinuses: There is mucosal thickening in the maxillary sinuses, left worse than right. There is a new right mastoid effusion. Skeleton: There is multilevel degenerative change of the cervical spine with disc space narrowing most advanced at C4-C5. There is moderate spinal canal stenosis at C2-C3 through C4-C5. Upper chest: The imaged lung apices are grossly unremarkable. Other: None. IMPRESSION: 1. No evidence of recurrent disease or  pathologic lymphadenopathy in the neck. Specifically, there is no suspicious finding in the tongue to correspond to the focus of ill-defined radiotracer uptake on the recent PET-CT. NIRADS 1. 2. Right mastoid effusion, nonspecific. Electronically Signed   By: Lesia Hausen M.D.   On: 09/18/2022  09:12   NM PET Image Restage (PS) Skull Base to Thigh (F-18 FDG)  Result Date: 09/08/2022 CLINICAL DATA:  Subsequent treatment strategy for head and neck cancer. EXAM: NUCLEAR MEDICINE PET SKULL BASE TO THIGH TECHNIQUE: 18.18 mCi F-18 FDG was injected intravenously. Full-ring PET imaging was performed from the skull base to thigh after the radiotracer. CT data was obtained and used for attenuation correction and anatomic localization. Fasting blood glucose: 100 mg/dl COMPARISON:  CT neck and chest from 03/13/2021. PET-CT from 09/04/2016 FINDINGS: Mediastinal blood pool activity: SUV max 2.37 Liver activity: SUV max NA NECK: Within the left side scratch set within the left posterior tongue there is a asymmetric focus of increased radiotracer uptake which has an SUV max of 4.33. This is compared with 1.61 on the contralateral side. No tracer avid cervical lymph nodes. Incidental CT findings: Asymmetric opacification of the left maxillary sinus. CHEST: Within the left lower lobe there is multiple areas of increased radiotracer uptake which correspond to areas of increased peribronchovascular densities, consolidation, are and atelectasis. Extensive tree-in-bud nodularity identified throughout the right middle lobe, both lower lobes and basilar right upper lobe. Occlusion of the left lower lobe airways identified, suboptimally evaluated due to nondiagnostic technique but suspicious for mucoid impaction. Subpleural nodular density within the posterior right lower lobe measures 9 mm without significant tracer uptake, image 70/7. Well-defined solid nodule in the left lower lobe is unchanged from 2022 measuring 3 mm, image 69/7 and is favored to represent a benign nodule. No discrete well-defined tracer avid lung nodule identified highly suspicious for metastatic disease. Increased tracer uptake identified within mediastinal and hilar lymph nodes. For example, right paratracheal lymph node measures 1 cm, maintains a  benign fatty hilum and has an SUV max of 3.19. Increased uptake within the left infrahilar region has an SUV max 3.63. Incidental CT findings: Coronary artery calcifications. No pericardial effusion. ABDOMEN/PELVIS: No abnormal hypermetabolic activity within the liver, pancreas, adrenal glands, or spleen. No hypermetabolic lymph nodes in the abdomen or pelvis. Incidental CT findings: Prostate gland enlargement. SKELETON: No focal hypermetabolic activity to suggest skeletal metastasis. Incidental CT findings: None. IMPRESSION: 1. Asymmetric focus of increased radiotracer uptake is identified within the left posterior tongue. Etiology indeterminate. Cannot exclude recurrent tumor. Consider further evaluation with direct visualization and/or contrast enhanced soft tissue neck CT or MRI. 2. No tracer avid cervical lymph nodes. 3. Heterogeneous, multifocal asymmetric increased uptake is identified within the left lower lobe. On the corresponding nondiagnostic CT images there is extensive tree-in-bud nodularity, thickening of the peribronchovascular interstitium, mucoid impaction, and atelectasis/airspace consolidation. Imaging findings are more likely to reflect a inflammatory/infectious process. Suspect chronic recurrent aspiration. Advise clinical correlation for signs/symptoms aspiration. Additionally, follow-up imaging with diagnostic CT of the chest in 3 months is recommended to ensure resolution and to exclude a central obstructing lesion which could also account for these changes. 4. Mildly tracer avid lymph nodes within the mediastinum and left hilum. In the setting of inflammation/infection these are likely reactive. Metastatic adenopathy is considered less favored but cannot be excluded with a high degree of certainty in this patient who has a history of known malignancy. Attention to these lymph nodes on follow-up imaging is advised. 5. Diffuse  tree-in-bud nodularity is also identified within the basilar right  upper lobe, right middle lobe and right lower lobe. This is compatible with an inflammatory/infectious bronchiolitis and may be seen with recurrent aspiration. 6. Coronary artery calcifications. 7. Prostate gland enlargement. 8. Asymmetric opacification of the left maxillary sinus. Correlate for any clinical signs or symptoms of sinusitis. Electronically Signed   By: Signa Kell M.D.   On: 09/08/2022 09:16    Impression/Plan:   This is a patient well-known by me with a history of locally advanced oropharyngeal cancer.  While recent imaging does not show any evidence of tumor recurrence he has multiple issues that we went over today.  We focused particularly on his swallowing issues as well as the issues with his weight loss, spinal cord and degenerative C-spine  He is going to get in touch with his orthopedic spine surgeon to try to schedule spinal surgery as soon as possible  Victorino Dike, our head and neck patient navigator, will get the patient back in w/ [name] for SLP. They prefer to do SLP w/ him here rather than at [facility] given that they have a history with [name] and live closer to our center.  I have asked Victorino Dike to also arrange nutritional reconsultation given his weight loss.   She will also arrange a gastroenterology consultation for upper endoscopy to rule out esophageal abnormalities - but arrange this GI consult to be in 48mo as he needs to have spine surgery first.   I let our speech-language pathologist, [name], know to inform me if he needs orders for any new studies that were not done at [facility] recently enough.  Pt is aspirating with signs of PNA on recent chest CT and he is losing wt. He feels like his tongue is not working as well as it used to and that there is a shelf sensation in his throat.  I am not sure if spine decompression might decompress his esophagus but diagnostic radiology does not think that his scans seemed very compelling for esophageal compression as I did.  The patient will  come back to see me in 5 months, sooner if needed.  He and his wife are pleased with this plan  On date of service, in total, I spent 40 minutes on this encounter. Patient was seen in person.  _____________________________________   Lonie Peak, MD

## 2022-09-26 NOTE — Progress Notes (Signed)
Todd [name]    [id number]    1960/12/26  Primary Care Physician:Tisovec, Adelfa Koh, MD  Referring Physician: Hervey Ard, PA-C [address] [place],  [place] [address] Reason for Consultation: pneumonia, abnormal CT Chest Date of Consultation: 09/26/2022  Chief complaint:   Chief Complaint  Patient presents with   Consult    Pt is being referred to bilateral pneumonia.  Pt states he has been having problems with SOB and also has had an occasional cough which has gotten better due to being on an abx.     HPI: Todd [name] is a 62 y.o. man who presents for new patient evaluation of pneumonia and abnormal CT Chest. 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 He has had pneumonia once already this year. He feels weak and fatigued. Notes dyspnea is with minimal exertion which interferes with ADLs and he needs to slow down.  He is being followed by Dr. Bertis Ruddy and had MRI of the neck which shows no recurrent disease. His surveillance pet scan showed some bilateral lower lobe predominant tree in bud opacities which were confirmed on a CT Chest as well as with debris within the trachea and bilateral main stem bronchi. He was referred here by [facility]. He was also started on antibiotics again. He was also given an inhaler albuterol which he takes less than 2-3 times/week. Does seem to help shortness of breath.   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 He was given some dysphagia exercises to perform with eating. He was also supposed to be on a dysphagia 3 (mechanical soft) diet with thin liquids.   More recently he has been doing SLP swallow therapy through [facility]  and is on a liquid diet.   Had bronchitis and pneumonia last year. No childhood respiratory disease.   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 20,000 steps a day.  Exposures: lives at home with wife.  Smoking history:  no substantial smoking use. No passive smoke exposure.   Social History   Occupational History   Occupation: Tax adviser    Comment: [employer]  Tobacco Use   Smoking status: Never   Smokeless tobacco: Never   Tobacco comments:    Per pt smoked from age 54 to age 16 socially but none since  Vaping Use   Vaping Use: Never used  Substance and Sexual Activity   Alcohol use: No   Drug use: Never   Sexual activity: Not on file    Relevant family history:  Family History  Problem Relation Age of Onset   Dementia Father    Diabetes Father    Cancer Maternal Grandmother        unknown ca   Asthma Son     Past Medical History:  Diagnosis Date   Acquired hypothyroidism    secondary to radiation //   followed by pcp   Arthritis    shoulders, neck, knees, feet   Bilateral sensorineural hearing loss    due to radiation   Decreased range of motion of neck    due to fibrotic changes of 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  Denervation atrophy of muscle    tongue ,  chronic ,  due to radiation injury   (neurologist-- dr a. Kate Sable ([facility])  EMG in care everywhere done 03-11-2022   Frequency of urination    GERD (gastroesophageal reflux disease)    History of cancer chemotherapy 2017   tonsil cancer   03-24-2016  to 05-06-2016   History of external beam radiation therapy    03-24-2016  to 05-15-2016  Left Tonsil and Bilateral Neck  @ [facility]   Hoarseness, chronic    due to radiation   Hyperlipidemia, mixed    Hypertension    Inguinal hernia, left    Lower facial weakness    intermittant left lower facial weakness   Numbness and tingling in left arm    intermittently per pt    Oropharyngeal dysphagia    hx neck radiation for left tonsil cancer ;  Speech therapy, regular diet with precautions swallows okay   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   followed by ENT dr bates/  oncologist--- dr Bertis Ruddy;  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;  completed chemo 05-06-2016 and completed radiation of left tonsil/ bilateral neck 05-15-2016  w/ radiation injury to tongue (denervation chronic)   Wears glasses    Wears hearing aid in both ears     Past Surgical History:  Procedure Laterality Date   INGUINAL HERNIA REPAIR Left 05/02/2022   Procedure: OPEN LEFT INGUINAL HERNIA REPAIR WITH MESH;  Surgeon: Berna Bue, MD;  Location: [facility];  Service: General;  Laterality: Left;   IR GASTROSTOMY TUBE REMOVAL  01/30/2017   IR GENERIC HISTORICAL  03/20/2016   IR US GUIDE VASC ACCESS RIGHT 03/20/2016 Simonne Come, MD [facility]   IR GENERIC HISTORICAL  03/20/2016   IR FLUORO GUIDE PORT INSERTION RIGHT 03/20/2016 Simonne Come, MD [facility]   IR GENERIC HISTORICAL  03/20/2016   IR GASTROSTOMY TUBE MOD SED 03/20/2016 Simonne Come, MD [facility]   IR REMOVAL TUN ACCESS W/ PORT W/O FL MOD SED  01/30/2017     Physical Exam: Blood pressure 120/78, pulse 85, temperature 98.2 F (36.8 C), temperature source Oral, height 5\' 10"  (1.778 m), weight 152 lb 12.8 oz (69.3 kg), SpO2 96 %. Gen:      No acute distress, mild dysarthria ENT:  no nasal polyps, mucus membranes moist, oral erythema with irregular surface of tongue and visible portions of hard and soft palate Lungs:    No increased respiratory effort, symmetric chest wall excursion, clear to auscultation bilaterally, no wheezes or crackles CV:         Regular rate and rhythm; no murmurs, rubs, or gallops.  No pedal edema Abd:      + bowel sounds; soft, non-tender; no distension MSK: no acute synovitis of DIP or PIP joints, no mechanics hands.  Skin:      Warm and dry; no rashes Neuro: normal speech, no focal facial  asymmetry Psych: alert and oriented x3, normal mood and affect   Data Reviewed/Medical Decision Making:  Independent interpretation of tests: Imaging:  Review of patient's CT Chest Sep 24 2022 images revealed bilateral multifocal opacities, basilar predominance, with debris in tracheobronchial tree consistent with aspiration. Lower lobe bronhchiectasis corresponds with chronic recurrent aspiration. The patient's images have been independently reviewed by me.    PFTs:  Labs:  Lab Results  Component Value Date   NA 138 08/26/2022   K 4.3 08/26/2022   CO2 32  08/26/2022   GLUCOSE 108 (H) 08/26/2022   BUN 29 (H) 08/26/2022   CREATININE 1.08 08/26/2022   CALCIUM 10.0 08/26/2022   EGFR 59 (L) 02/16/2017   GFRNONAA >60 08/26/2022   Lab Results  Component Value Date   WBC 8.3 08/26/2022   HGB 11.7 (L) 08/26/2022   HCT 34.6 (L) 08/26/2022   MCV 84.4 08/26/2022   PLT 374 08/26/2022     Immunization status:   There is no immunization history on file for this patient.   I reviewed prior external note(s) from oncology, pcp  I reviewed the result(s) of the labs and imaging as noted above.   I have ordered    Assessment:  Recurrent Aspiration Pneumonia History of SCC of the pharynx Dysphagia s/p ENT radiation  Plan/Recommendations:  I talked with Mr. Honea at length regarding his constellation of findings in the setting ENT cancer s/p chemo/rads. I have encouraged him to follow up with SLP. He is having weight loss and given the intensity of his job I suspect he's having difficulty keeping up his caloric needs with liquid diet only.   I agree these findings are most consistent with recurrent aspiration pneumonia and unlikely to be helped with recurrent antibiotic therapy.   We discussed limiting antibiotic therapy to only situations when he is having true signs or symptoms of infection including change in cough or mucus production, fevers, systemic signs of infection.   I  am happy to see him back as needed or if there is a change in clinical situation or his circumstances.   I spent 45 minutes in the care of this patient today including pre-charting, chart review, review of results, face-to-face care, coordination of care and communication with consultants etc.).  Return to Care: Return if symptoms worsen or fail to improve.  Durel Salts, MD Pulmonary and Critical Care Medicine [facility] [place] [phone]  CC: Hervey Ard, [place]*

## 2022-09-29 ENCOUNTER — Other Ambulatory Visit: Payer: Self-pay

## 2022-09-29 ENCOUNTER — Encounter: Payer: Self-pay | Admitting: Physician Assistant

## 2022-09-29 ENCOUNTER — Telehealth: Payer: Self-pay | Admitting: Dietician

## 2022-09-29 DIAGNOSIS — C099 Malignant neoplasm of tonsil, unspecified: Secondary | ICD-10-CM

## 2022-09-30 ENCOUNTER — Telehealth: Payer: Self-pay | Admitting: *Deleted

## 2022-09-30 DIAGNOSIS — M5412 Radiculopathy, cervical region: Secondary | ICD-10-CM | POA: Diagnosis not present

## 2022-09-30 NOTE — Telephone Encounter (Signed)
Called patient to inform of consult with GI doctor Quentin Mulling) for 12-10-22 - arrival time- 2:30 pm- address [address] [place] for a return call

## 2022-10-01 ENCOUNTER — Telehealth: Payer: Self-pay | Admitting: *Deleted

## 2022-10-01 NOTE — Telephone Encounter (Signed)
RETURNED PATIENT'S WIFE'S PHONE CALL, SPOKE WITH PATIENT'S WIFE (Todd [name])

## 2022-10-03 ENCOUNTER — Inpatient Hospital Stay: Payer: BC Managed Care – PPO | Admitting: Dietician

## 2022-10-03 NOTE — Progress Notes (Signed)
Nutrition Follow-up:  Patient completed concurrent chemoradiation for tonsil cancer. Final radiation 05/15/16 under the care of Dr. Basilio Cairo. Patient now with dysphagia and recurrent aspiration pneumonia.   Noted Neck MRI (09/15/22) negative for recurrent disease  Met with patient and wife in office. He reports progressive dysphagia that began ~1 year ago. Patient is tolerating a liquid diet, however reports "occasional coughing spells" with thick liquids. He is able to eat some soft foods including mac/cheese and bean burrito from [employer]. This takes him over an hour to eat. He was tolerating pasta until a couple months ago. Wife reports MBS in October was cancelled due to aspiration pna.  Patient endorses a good appetite. He stays active with his job stating he burns ~3000 calories/day. Patient recalls eating pop tart with his coffee on the way to work. He states the pop tart absorbs coffee well and able to swallow. He is drinking 4 Boost Plus/Ensure Plus as well as daily shake (protein powder, banana, blueberry, peanut butter powder, 2% milk). Says this provides him 600 calories. Patient is planning to have surgery for cervical radiculopathy. He states this will be a big surgery and needs to gain weight before having it scheduled.    Medications: reviewed   Labs: 4/16 labs reviewed   Anthropometrics: Wt 152 lb 12.8 oz on 5/17  4/29 - 162 lb 9.6 oz 12/22 - 170 lb   11% wt loss in 5 months - severe for time frame   NUTRITION DIAGNOSIS:  Inadequate oral intake continues  Severe malnutrition continues    INTERVENTION:  Recommend MBSS and SLP evaluation Continue thin liquids - educated on chin tuck with swallow  Discussed ways to add calories and protein to liquids (switching to whole fairlife milk, using milk vs water for soups, adding cheese/sour cream/gravy) Suggested Boost VHC (530 kcal, 22 g) vs Boost Plus - samples + coupons given for pt to try - educated to thin with milk/pour over  ice for thinner consistency. Recommend 4/day Continue high calorie shake - shake recipes + CIB powder samples     MONITORING, EVALUATION, GOAL: weight trends, intake   NEXT VISIT: To be scheduled as needed - contact information given and encouraged pt to contact with nutrition questions/concerns

## 2022-10-14 ENCOUNTER — Other Ambulatory Visit: Payer: Self-pay

## 2022-10-14 ENCOUNTER — Other Ambulatory Visit (HOSPITAL_COMMUNITY): Payer: Self-pay

## 2022-10-14 DIAGNOSIS — R1310 Dysphagia, unspecified: Secondary | ICD-10-CM

## 2022-10-14 DIAGNOSIS — R918 Other nonspecific abnormal finding of lung field: Secondary | ICD-10-CM

## 2022-10-14 DIAGNOSIS — C099 Malignant neoplasm of tonsil, unspecified: Secondary | ICD-10-CM

## 2022-10-21 ENCOUNTER — Encounter: Payer: Self-pay | Admitting: Nurse Practitioner

## 2022-10-21 ENCOUNTER — Ambulatory Visit (INDEPENDENT_AMBULATORY_CARE_PROVIDER_SITE_OTHER): Payer: BC Managed Care – PPO | Admitting: Nurse Practitioner

## 2022-10-21 VITALS — BP 88/64 | HR 75 | Ht 69.0 in | Wt 149.0 lb

## 2022-10-21 DIAGNOSIS — R1313 Dysphagia, pharyngeal phase: Secondary | ICD-10-CM | POA: Diagnosis not present

## 2022-10-21 DIAGNOSIS — R1310 Dysphagia, unspecified: Secondary | ICD-10-CM

## 2022-10-21 DIAGNOSIS — K148 Other diseases of tongue: Secondary | ICD-10-CM | POA: Diagnosis not present

## 2022-10-21 DIAGNOSIS — H903 Sensorineural hearing loss, bilateral: Secondary | ICD-10-CM | POA: Diagnosis not present

## 2022-10-21 DIAGNOSIS — R634 Abnormal weight loss: Secondary | ICD-10-CM | POA: Diagnosis not present

## 2022-10-21 DIAGNOSIS — H6122 Impacted cerumen, left ear: Secondary | ICD-10-CM | POA: Diagnosis not present

## 2022-10-21 NOTE — Patient Instructions (Addendum)
Contact our office after you see ENT & the spine surgeon.  If your blood pressure at your visit was 140/90 or greater, please contact your primary care physician to follow up on this.  _______________________________________________________  If you are age 62 or older, your body mass index should be between 23-30. Your Body mass index is 22 kg/m. If this is out of the aforementioned range listed, please consider follow up with your Primary Care Provider.  If you are age 69 or younger, your body mass index should be between 19-25. Your Body mass index is 22 kg/m. If this is out of the aformentioned range listed, please consider follow up with your Primary Care Provider.   ________________________________________________________  The [facility] providers would like to encourage you to use [facility] to communicate with providers for non-urgent requests or questions.  Due to long hold times on the telephone, sending your provider a message by [facility] may be a faster and more efficient way to get a response.  Please allow 48 business hours for a response.  Please remember that this is for non-urgent requests.  _______________________________________________________  Thank you for trusting me with your gastrointestinal care!   Alcide Evener, CRNP

## 2022-10-21 NOTE — Progress Notes (Addendum)
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 He presents to our office today as referred by radiation oncologist Dr. Lonie Peak to schedule an upper endoscopy to rule out esophageal abnormality. Dr. Basilio Cairo noted a GI consult to be done in 3 months as he needs spine surgery first.   He has chronic dysphagia since summer 2023. He describes having difficulty swallowing pills, liquids and solid foods which sometimes backs up into his mouth and out his nose. He describes having difficulty swallowing above the suprasternal notch. He feels like there is a shelf in his mouth or throat and the food, liquids and pills get hung up. He is tolerating mostly a liquid diet. He can tolerate eating pasta, tuna, [place], banana and peanut butter. He drinks carnation instant breakfast with protein powder once daily, about 900 calories. Some days he drinks 2 Boosts. He drinks one cup of coffee daily. He takes Famotidine 20mg  QHS. No heartburn. He endorses losing 40lbs since Jan. 2024. He is schedule to see his ENT later today and spine surgeon Dr. Debbe Bales next week. He stated his spine specialist thought he would need his ENT present during any future cervical spine surgery to guide him through the prior radiated neck anatomy. It is unclear, if he is a candidate for cervical spine surgery at this juncture. He is scheduled for a swallow study with a speech pathologist at [facility] on 10/30/2022.   He is passing fairly normal bowel movements daily. No rectal bleeding or black stools. He stated having a colonoscopy 15 years ago, results unknown.   He was seen by pulmonologist Dr. Celine Mans 09/26/2022  for further evaluation regarding pneumonia and abnormal chest CT.  Reported shortness of breath with associated weight loss x 4 months.  MRI of the neck without evidence of recurrent disease. Surveillance pet scan showed some bilateral lower lobe predominant tree in bud opacities which were confirmed on a CT Chest as well as with debris within the trachea and bilateral main stem bronchi.   Addendum: Labs 10/22/2022: Glucose 93.  Creatinine 1.0.  Sodium 137.  Potassium 4.4.  Total bili 0.4.  Albumin 3.5.  Alk phos 49.  AST 18.  ALT 19.  WBC 5.77.  Hemoglobin 12.  Hematocrit 38.5.  MCV 86.8.  Platelet 430.  TSH 1.62.     Latest Ref Rng & Units 08/26/2022   12:19 PM 05/02/2022    9:07 AM 11/23/2017   11:26 AM  CBC  WBC 4.0 - 10.5 K/uL 8.3   4.2   Hemoglobin 13.0 - 17.0 g/dL 40.9  81.1  91.4   Hematocrit 39.0 - 52.0 % 34.6  41.0  36.6   Platelets 150 - 400 K/uL 374   263         Latest Ref Rng & Units 08/26/2022   12:19 PM 05/02/2022    9:07 AM 11/23/2017   11:26 AM  CMP  Glucose 70 - 99 mg/dL 782  956  97   BUN 8 - 23 mg/dL 29  22  29    Creatinine 0.61 - 1.24 mg/dL 2.13  0.86  5.78   Sodium 135 - 145 mmol/L 138  140  138   Potassium 3.5 - 5.1 mmol/L 4.3  4.0  4.2   Chloride 98 - 111 mmol/L 102  101  101   CO2 22 - 32 mmol/L 32   29   Calcium 8.9 - 10.3 mg/dL 16.1   09.6   Total Protein 6.5 - 8.1 g/dL 7.2   7.2   Total Bilirubin 0.3 - 1.2 mg/dL 0.3   0.4   Alkaline Phos 38 - 126 U/L 43   43   AST 15 - 41 U/L 23   26   ALT 0 - 44 U/L 16   21     Chest CT 09/24/2022: FINDINGS: Cardiovascular: Coronary artery calcification. Heart is mildly enlarged. No pericardial effusion.   Mediastinum/Nodes: Mediastinal lymph nodes measure up to 12 mm in the low right paratracheal station, unchanged from 09/04/2022. Bihilar lymph nodes measure up to 10 mm on the right. No axillary adenopathy. Esophagus is grossly unremarkable.   Lungs/Pleura: Chronic apical subpleural ground-glass,  possibly radiation related. Worsening peribronchovascular ground-glass, nodularity and consolidation throughout the lungs, worst in the left lower lobe. Scattered septal thickening. No pleural fluid. Debris is seen in the bronchus intermedius and left mainstem bronchus.   Upper Abdomen: Probable right hepatic lobe cyst. Visualized portion of the liver is otherwise unremarkable. Adrenal glands and visualized portion of the right kidney are unremarkable. Low-attenuation lesion in the upper pole left kidney, too small to characterize. No specific follow-up necessary. Visualized portions of the spleen, pancreas, stomach and bowel are grossly unremarkable. No upper abdominal adenopathy.   Musculoskeletal: Degenerative changes in the spine. No worrisome lytic or sclerotic lesions.   IMPRESSION: 1. Progressive diffuse peribronchovascular ground-glass, nodularity and consolidation with scattered septal thickening, findings which may be due to a worsening atypical/fungal infectious process. Lymphangitic carcinomatosis is not excluded. 2. Borderline enlarged mediastinal lymph nodes, possibly reactive. Recommend continued attention on follow-up as malignancy cannot be excluded. 3. Coronary artery calcification.   Neck MRI on 09/14/2022: IMPRESSION: 1. No evidence of recurrent disease or pathologic lymphadenopathy in the neck. Specifically, there is no suspicious finding in the tongue to correspond to the focus of ill-defined radiotracer uptake on the recent PET-CT. NIRADS 1. 2. Right mastoid effusion, nonspecific.    PET scan on 09/04/2022: IMPRESSION: 1. Asymmetric focus of increased radiotracer uptake is identified within the left posterior tongue. Etiology indeterminate. Cannot exclude recurrent tumor. Consider further evaluation with direct visualization and/or contrast enhanced soft tissue neck CT or MRI. 2. No tracer avid cervical lymph nodes. 3. Heterogeneous, multifocal  asymmetric increased uptake is identified within the left lower lobe. On the corresponding nondiagnostic CT images there is extensive tree-in-bud nodularity, thickening of the peribronchovascular interstitium, mucoid impaction, and atelectasis/airspace consolidation. Imaging findings are more likely to reflect a inflammatory/infectious process. Suspect chronic recurrent aspiration. Advise clinical correlation for signs/symptoms aspiration. Additionally, follow-up imaging with diagnostic CT of the chest in 3 months is recommended to ensure resolution and to exclude a central obstructing lesion which could also account for these changes. 4. Mildly tracer avid lymph nodes within the mediastinum and left hilum. In the setting of inflammation/infection these are likely reactive. Metastatic adenopathy is considered less favored but cannot be excluded with a high degree of certainty in this patient who has a history of known malignancy. Attention to these lymph nodes on follow-up imaging is advised. 5. Diffuse tree-in-bud nodularity is also identified within the basilar right upper lobe, right middle lobe and right lower lobe. This is compatible with an inflammatory/infectious bronchiolitis and may be seen with recurrent aspiration. 6. Coronary artery  calcifications. 7. Prostate gland enlargement. 8. Asymmetric opacification of the left maxillary sinus. Correlate for any clinical signs or symptoms of sinusitis.  Past Medical History:  Diagnosis Date   Acquired hypothyroidism    secondary to radiation //   followed by pcp   Arthritis    shoulders, neck, knees, feet   Bilateral sensorineural hearing loss    due to radiation   Decreased range of motion of neck    due to fibrotic changes of 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   Denervation atrophy of muscle    tongue ,  chronic ,  due to radiation injury   (neurologist-- dr a. Kate Sable ([facility])  EMG in care everywhere done 03-11-2022    Frequency of urination    GERD (gastroesophageal reflux disease)    History of cancer chemotherapy 2017   tonsil cancer   03-24-2016  to 05-06-2016   History of external beam radiation therapy    03-24-2016  to 05-15-2016  Left Tonsil and Bilateral Neck  @ [facility]   Hoarseness, chronic    due to radiation   Hyperlipidemia, mixed    Hypertension    Inguinal hernia, left    Lower facial weakness    intermittant left lower facial weakness   Numbness and tingling in left arm    intermittently per pt   Oropharyngeal dysphagia    hx neck radiation for left tonsil cancer ;  Speech therapy, regular diet with precautions swallows okay   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   followed by ENT dr bates/  oncologist--- dr Bertis Ruddy;  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;  completed chemo 05-06-2016 and completed radiation of left tonsil/ bilateral neck 05-15-2016  w/ radiation injury to tongue (denervation chronic)   Wears glasses    Wears hearing aid in both ears    Past Surgical History:  Procedure Laterality Date   INGUINAL HERNIA REPAIR Left 05/02/2022   Procedure: OPEN LEFT INGUINAL HERNIA REPAIR WITH MESH;  Surgeon: Berna Bue, MD;  Location: [facility];  Service: General;  Laterality: Left;   IR GASTROSTOMY TUBE REMOVAL  01/30/2017   IR GENERIC HISTORICAL  03/20/2016   IR US GUIDE VASC ACCESS RIGHT 03/20/2016 Simonne Come, MD [facility]   IR GENERIC HISTORICAL  03/20/2016   IR FLUORO GUIDE PORT INSERTION RIGHT 03/20/2016 Simonne Come, MD [facility]   IR GENERIC HISTORICAL  03/20/2016   IR GASTROSTOMY TUBE MOD SED 03/20/2016 Simonne Come, MD [facility]   IR REMOVAL TUN ACCESS W/ PORT W/O FL MOD SED  01/30/2017   Social History: He is married. He has one son. [employer] blower/designer. Nonsmoker. No alcohol use. No drug use.   Family History: No known family history of esophageal, gastric or colorectal cancer. Father had diabetes and dementia.    Allergies   Allergen Reactions   Phenergan [Promethazine Hcl]     Got really nervous and shaky..   Sulfamethoxazole-Trimethoprim Other (See Comments)      Outpatient Encounter Medications as of 10/21/2022  Medication Sig   acetaminophen (TYLENOL) 650 MG CR tablet Take 650 mg by mouth every 8 (eight) hours as needed for pain.   albuterol (VENTOLIN HFA) 108 (90 Base) MCG/ACT inhaler    amLODipine (NORVASC) 10 MG tablet Take 1 tablet (10 mg total) by mouth daily.   Ascorbic Acid (VITAMIN C) 1000 MG tablet Take 200 mg by mouth daily.   aspirin EC 81 MG tablet Take 81 mg by  mouth daily. Swallow whole.   cefdinir (OMNICEF) 300 MG capsule Take by mouth.   Coenzyme Q10 (COQ-10) 200 MG CAPS Take 1 capsule by mouth daily.   doxycycline (VIBRA-TABS) 100 MG tablet Take 100 mg by mouth 2 (two) times daily.   Famotidine (PEPCID PO) Take 1 tablet by mouth as needed (gerd).   fenofibrate (TRICOR) 145 MG tablet Take 145 mg by mouth daily.    fexofenadine (ALLEGRA) 180 MG tablet Take 180 mg by mouth daily.   glucosamine-chondroitin 500-400 MG tablet Take 2 tablets by mouth every morning.   guaiFENesin (MUCINEX) 600 MG 12 hr tablet Take 600 mg by mouth 2 (two) times daily as needed for cough or to loosen phlegm.   levothyroxine (SYNTHROID) 125 MCG tablet Take 125 mcg by mouth daily before breakfast.   Multiple Vitamin (MULTIVITAMIN) capsule Take 1 capsule by mouth daily.   tamsulosin (FLOMAX) 0.4 MG CAPS capsule Take 0.4 mg by mouth daily.   No facility-administered encounter medications on file as of 10/21/2022.    REVIEW OF SYSTEMS:  Gen: See HPI. + Fatigue, nigh sweats and weight loss.  CV: Denies chest pain, palpitations or edema. Resp: + Cough. No hemoptysis.  GI: See HPI.  GU : Denies urinary burning, blood in urine, increased urinary frequency or incontinence. MS: + Back pain, arthritis. Derm: Denies rash, itchiness, skin lesions or unhealing ulcers. Psych: Denies depression, anxiety, memory loss or  confusion. Heme: Denies bruising, easy bleeding. Neuro:  Denies headaches, dizziness or paresthesias. Endo:  Denies any problems with DM, thyroid or adrenal function.  PHYSICAL EXAM: BP (!) 88/64   Pulse 75   Ht 5\' 9"  (1.753 m)   Wt 149 lb (67.6 kg)   SpO2 94%   BMI 22.00 kg/m  Wt Readings from Last 3 Encounters:  10/21/22 149 lb (67.6 kg)  09/26/22 153 lb (69.4 kg)  09/26/22 152 lb 12.8 oz (69.3 kg)    General: 62 year old male in no acute distress. Head: Normocephalic and atraumatic. Eyes:  Sclerae non-icteric, conjunctive pink. Ears: Normal auditory acuity. Mouth: Dentition intact. No ulcers or lesions.  Neck: Supple, no lymphadenopathy or thyromegaly.  Lungs: Clear bilaterally to auscultation without wheezes, crackles or rhonchi. Right rib cage protrudes/deformity.  Heart: Regular rate and rhythm. No murmur, rub or gallop appreciated.  Abdomen: Soft, nontender, flat abdomen/nondistended. No masses. No hepatosplenomegaly. Normoactive bowel sounds x 4 quadrants. Past PEG tube scar intact.  Rectal: Deferred.  Musculoskeletal: Symmetrical with no gross deformities. Skin: Warm and dry. No rash or lesions on visible extremities. Extremities: No edema. Neurological: Alert oriented x 4, no focal deficits.  Psychological:  Alert and cooperative. Normal mood and affect.  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 Oral phase dysphagia secondary to prior radiation tx and cervical disc disease.  -Proceed with swallow study with speech pathologist at [facility] as scheduled 10/30/2022 -Patient to provide update after he sees ENT later today and spine surgeon next week -Radiation oncologist requested GI consult/possible EGD after spine surgery -Dr. Lavon Paganini to review case to verify if EGD appropriate following C  spine surgery. It is not yet clearly understood if the patient is a candidate for C spine surgery  -Continue Famotidine 20mg  QHS -Continue follow up with oncology team and nutritionist   Today's encounter was 40 minutes which included precharting, extensive  chart/result review, history/exam, face-to-face time used for counseling, formulating a treatment plan with follow-up and documentation.   CC:  Tisovec, Adelfa Koh, MD

## 2022-10-22 DIAGNOSIS — C099 Malignant neoplasm of tonsil, unspecified: Secondary | ICD-10-CM | POA: Diagnosis not present

## 2022-10-22 DIAGNOSIS — I129 Hypertensive chronic kidney disease with stage 1 through stage 4 chronic kidney disease, or unspecified chronic kidney disease: Secondary | ICD-10-CM | POA: Diagnosis not present

## 2022-10-22 DIAGNOSIS — E039 Hypothyroidism, unspecified: Secondary | ICD-10-CM | POA: Diagnosis not present

## 2022-10-23 ENCOUNTER — Telehealth: Payer: Self-pay | Admitting: Nurse Practitioner

## 2022-10-23 DIAGNOSIS — M5412 Radiculopathy, cervical region: Secondary | ICD-10-CM | POA: Diagnosis not present

## 2022-10-23 LAB — LAB REPORT - SCANNED
A1c: 5.4
EGFR (Non-African Amer.): 91.9

## 2022-10-23 NOTE — Telephone Encounter (Signed)
Inbound call from patient f/u up on visit with the ent . States patient will see spine surgeon today at 3 pm.   States patient will need a G tube placed. Please advise.  Thank you

## 2022-10-24 DIAGNOSIS — M5412 Radiculopathy, cervical region: Secondary | ICD-10-CM | POA: Diagnosis not present

## 2022-10-24 NOTE — Telephone Encounter (Signed)
Patient's wife called in to let Jill Side, NP know that they saw Dr. Wylene Simmer (PCP) on Wednesday & he is recommending G-tube placement for night time feeds, and would like this to be placed as early as next week. They also discussed his low BP. At appt it was 120/78. He would also like for patient to have EGD as well with Dr. Lavon Paganini. He was seen by Dr. Jenne Pane (ENT) yesterday & will see Dr. Shon Baton (spine surgeon) today. She will call Monday with an update.

## 2022-10-24 NOTE — Telephone Encounter (Signed)
Dr. Lavon Paganini, this is patient we discussed on 10/23/2022. Please see phone note below. Await further update after patient sees his spine surgeon. Per our discussion yesterday, you discussed possible barium swallow study prior to considering an EGD. Complex situation.  Kaira/Steven, please forward any further update to Dr. Lavon Paganini as I will be out of the office until 6/19. Thanks.

## 2022-10-27 ENCOUNTER — Other Ambulatory Visit: Payer: Self-pay | Admitting: Hematology and Oncology

## 2022-10-27 ENCOUNTER — Telehealth: Payer: Self-pay

## 2022-10-27 ENCOUNTER — Telehealth: Payer: Self-pay | Admitting: Hematology and Oncology

## 2022-10-27 NOTE — Telephone Encounter (Signed)
Inbound call from patient's Wife requesting a call back regarding update regarding spine surgeon appointment. Please advise, thank you.

## 2022-10-27 NOTE — Telephone Encounter (Signed)
Returned call to wife. She is concerned about Revell not drinking enough. He is not able to get in 64 oz a day. He is still losing weight  still and struggling with getting food/ calories per day. She is requesting IV fluids 2x a week, Wednesday after 3 pm and Saturday am.  Peg tube is pending with GI. He seeing [facility] for possible spinal surgery. He is scheduled for swallowing study this week. Above message given to Dr. Bertis Ruddy.

## 2022-10-27 NOTE — Telephone Encounter (Signed)
Pt wife Todd [name] stated that she was notified to call back with an appointment date and time of pt's spine surgeon appointment.  Pt stated that they notified her that they was not going to be able to do the surgery locally and that they were going to have to go to [facility]. Todd [name] stated that they have a consult with a neurosurgeon next Tuesday.  Todd [name] stated that pt  seems to be wasting away and the PCP is recommending an EGD to be done and an G tube to be  placed.  Pt stated that pt PCP stated that if Dr. Lavon Paganini did not want  proceed with the recommendation then he would place an order for Radiology to be consulted for G tube to be placed. Please advise.

## 2022-10-27 NOTE — Telephone Encounter (Signed)
Called and given appt for IV fluids at 3 pm 6/19 to wife. Scheduling message sent to IV fluids x 2 weeks. Wife verbalized understanding.

## 2022-10-28 NOTE — Telephone Encounter (Signed)
Please advise patient to proceed with G-tube placement by radiology as recommended by PCP.  Per chart review, he is at risk for procedure and anesthesia related issues and do not recommend proceeding with EGD at this point

## 2022-10-29 ENCOUNTER — Other Ambulatory Visit: Payer: Self-pay

## 2022-10-29 ENCOUNTER — Other Ambulatory Visit (HOSPITAL_COMMUNITY): Payer: Self-pay | Admitting: Internal Medicine

## 2022-10-29 ENCOUNTER — Telehealth (HOSPITAL_COMMUNITY): Payer: Self-pay

## 2022-10-29 ENCOUNTER — Inpatient Hospital Stay: Payer: BC Managed Care – PPO | Attending: Hematology and Oncology

## 2022-10-29 ENCOUNTER — Ambulatory Visit: Payer: BC Managed Care – PPO

## 2022-10-29 VITALS — BP 160/95 | HR 72 | Temp 98.4°F | Resp 18 | Wt 148.4 lb

## 2022-10-29 DIAGNOSIS — Z85810 Personal history of malignant neoplasm of tongue: Secondary | ICD-10-CM | POA: Diagnosis not present

## 2022-10-29 DIAGNOSIS — R634 Abnormal weight loss: Secondary | ICD-10-CM | POA: Diagnosis not present

## 2022-10-29 DIAGNOSIS — C099 Malignant neoplasm of tonsil, unspecified: Secondary | ICD-10-CM

## 2022-10-29 DIAGNOSIS — R1312 Dysphagia, oropharyngeal phase: Secondary | ICD-10-CM

## 2022-10-29 MED ORDER — SODIUM CHLORIDE 0.9 % IV SOLN: Freq: Once | INTRAVENOUS | Status: AC

## 2022-10-29 NOTE — Patient Instructions (Signed)

## 2022-10-29 NOTE — Telephone Encounter (Signed)
Pt wife Arline Asp made aware of Dr. Lavon Paganini recommendations: Arline Asp stated that his PCP just ordered for the pt to have Radiology place G Tube on 11/07/2022 Arline Asp verbalized understanding with all questions answered.

## 2022-10-29 NOTE — Telephone Encounter (Signed)
-----   Message from Richarda Overlie, MD sent at 10/28/2022  1:16 PM EDT ----- Regarding: RE: Peg placement PROCEDURE / BIOPSY REVIEW Date: 10/28/22  Requested Biopsy site: Gastrostomy tube placement Reason for request: Dysphagia Imaging review: Anatomy is amendable based on Chest CT from May 2024  Decision: Approved Imaging modality to perform: Fluoroscopy/ IR Schedule with: Moderate Sedation Schedule for: Any VIR  Additional comments:  @Schedulers . Have patient drink barium/contrast the night before if he can  Please contact me with questions, concerns, or if issue pertaining to this request arise.  Arn Medal, MD [facility]   ----- Message ----- From: Sharee Pimple Sent: 10/27/2022   3:28 PM EDT To: Ir Procedure Requests Subject: Peg placement                                  Procedure: Gastrostomy tube placement  Dx: oropharyngeal dysphagia  Imaging: NM pet done 09/03/22 in epic  Ordering: Dr. Wylene Simmer [facility]) [phone]  Please review.   Thanks,  Fara Boros

## 2022-10-30 ENCOUNTER — Telehealth (HOSPITAL_COMMUNITY): Payer: Self-pay

## 2022-10-30 ENCOUNTER — Ambulatory Visit (HOSPITAL_COMMUNITY)
Admission: RE | Admit: 2022-10-30 | Discharge: 2022-10-30 | Disposition: A | Discharge status: Home / Self Care | Payer: BC Managed Care – PPO | Source: Ambulatory Visit | Attending: Internal Medicine | Admitting: Internal Medicine

## 2022-10-30 DIAGNOSIS — R1310 Dysphagia, unspecified: Secondary | ICD-10-CM | POA: Insufficient documentation

## 2022-10-30 DIAGNOSIS — R4921 Hypernasality: Secondary | ICD-10-CM | POA: Insufficient documentation

## 2022-10-30 DIAGNOSIS — E039 Hypothyroidism, unspecified: Secondary | ICD-10-CM | POA: Insufficient documentation

## 2022-10-30 DIAGNOSIS — R918 Other nonspecific abnormal finding of lung field: Secondary | ICD-10-CM

## 2022-10-30 DIAGNOSIS — R059 Cough, unspecified: Secondary | ICD-10-CM | POA: Insufficient documentation

## 2022-10-30 DIAGNOSIS — Z85818 Personal history of malignant neoplasm of other sites of lip, oral cavity, and pharynx: Secondary | ICD-10-CM | POA: Insufficient documentation

## 2022-10-30 DIAGNOSIS — C099 Malignant neoplasm of tonsil, unspecified: Secondary | ICD-10-CM

## 2022-10-30 NOTE — Telephone Encounter (Signed)
Spoke to [employer] and she is able to provide peg education for Todd [name] on Wed. I left a vm for Dr. Deneen Harts office letting them know that he will need nutrition set up as well. AB

## 2022-10-31 ENCOUNTER — Inpatient Hospital Stay: Payer: BC Managed Care – PPO

## 2022-10-31 ENCOUNTER — Other Ambulatory Visit: Payer: Self-pay

## 2022-10-31 VITALS — BP 162/106 | HR 71 | Resp 16

## 2022-10-31 DIAGNOSIS — R634 Abnormal weight loss: Secondary | ICD-10-CM

## 2022-10-31 DIAGNOSIS — C099 Malignant neoplasm of tonsil, unspecified: Secondary | ICD-10-CM

## 2022-10-31 DIAGNOSIS — Z85810 Personal history of malignant neoplasm of tongue: Secondary | ICD-10-CM | POA: Diagnosis not present

## 2022-10-31 MED ORDER — SODIUM CHLORIDE 0.9 % IV SOLN: Freq: Once | INTRAVENOUS | Status: AC

## 2022-10-31 NOTE — Patient Instructions (Signed)

## 2022-11-04 DIAGNOSIS — M4722 Other spondylosis with radiculopathy, cervical region: Secondary | ICD-10-CM | POA: Diagnosis not present

## 2022-11-04 DIAGNOSIS — M4803 Spinal stenosis, cervicothoracic region: Secondary | ICD-10-CM | POA: Diagnosis not present

## 2022-11-04 DIAGNOSIS — M4802 Spinal stenosis, cervical region: Secondary | ICD-10-CM | POA: Diagnosis not present

## 2022-11-05 ENCOUNTER — Other Ambulatory Visit: Payer: Self-pay

## 2022-11-05 ENCOUNTER — Inpatient Hospital Stay: Payer: BC Managed Care – PPO

## 2022-11-05 ENCOUNTER — Telehealth: Payer: Self-pay

## 2022-11-05 ENCOUNTER — Other Ambulatory Visit: Payer: Self-pay | Admitting: Radiology

## 2022-11-05 VITALS — BP 150/87 | HR 70 | Temp 98.1°F | Resp 15

## 2022-11-05 DIAGNOSIS — R634 Abnormal weight loss: Secondary | ICD-10-CM

## 2022-11-05 DIAGNOSIS — C099 Malignant neoplasm of tonsil, unspecified: Secondary | ICD-10-CM

## 2022-11-05 DIAGNOSIS — Z85810 Personal history of malignant neoplasm of tongue: Secondary | ICD-10-CM | POA: Diagnosis not present

## 2022-11-05 DIAGNOSIS — R1310 Dysphagia, unspecified: Secondary | ICD-10-CM

## 2022-11-05 MED ORDER — SODIUM CHLORIDE 0.9 % IV SOLN: Freq: Once | INTRAVENOUS | Status: AC

## 2022-11-05 NOTE — Progress Notes (Signed)
Oncology Nurse Navigator Documentation   Met with Todd [name] and Todd [name] to provide PEG education prior to 11/07/22 placement.   Using  Enfit PEG teaching device   and Teach Back, provided education for PEG use and care, including: hand hygiene, gravity bolus administration of daily water flushes and nutritional supplement, fluids and medications; care of tube insertion site including daily dressing change and cleaning; S&S of infection.   Todd [name] and Todd [name] correctly verbalized procedures for gravity administration of water, dressing change and site care. He has had a previous PEG years ago so I emphasized the difference with the new Enfit syringe.  I provided written instructions for PEG flushing/dressing change in support of verbal instruction.   I provided/described contents of Start of Care Bolus Feeding Kit (3 60 cc syringes, 1 box 4x4 drainage sponges, 1 package mesh briefs, 1 roll paper tape, 1 case Osmolite 1.5).  They  voiced understanding he is to start using Osmolite per guidance of Nutrition. I explained that Todd primary care office would need to refer to a [facility] agency with a Registered Dietician to manage Tube feeding and supplies.  Provided barium sulfate prep which I obtained from [facility] and reviewed instructions.    Hedda Slade RN, BSN, OCN Head & Neck Oncology Nurse Navigator [facility] at [facility] Phone # [phone]  Fax # [phone]

## 2022-11-05 NOTE — Telephone Encounter (Signed)
I am not sure if we have room to accommodate next week I will send LOS for scheduler to schedule more fluids and will get Steward Drone to follow

## 2022-11-05 NOTE — Telephone Encounter (Signed)
This nurse received a message from this patient's wife stating the fluids have been helping and he has maintained hs weight at 148 lbs.  Stomach tube placement is scheduled for Friday 11/07/22.  Patient did see neuro surgeon at [facility] and was informed that neck surgery would actually make his symptoms worse and it is not recommended.  Patient will go back to Chiropractor, physical therapy and pain management.  Patient would like to also continue receiving fluids twice a week for now if the provider is ok with that.  No further questions or concerns noted at this time and provider has been made aware.

## 2022-11-06 ENCOUNTER — Other Ambulatory Visit: Payer: Self-pay | Admitting: Internal Medicine

## 2022-11-07 ENCOUNTER — Telehealth: Payer: Self-pay | Admitting: Hematology and Oncology

## 2022-11-07 ENCOUNTER — Ambulatory Visit (HOSPITAL_COMMUNITY)
Admission: RE | Admit: 2022-11-07 | Discharge: 2022-11-07 | Disposition: A | Discharge status: Home / Self Care | Payer: BC Managed Care – PPO | Source: Ambulatory Visit | Attending: Internal Medicine | Admitting: Internal Medicine

## 2022-11-07 DIAGNOSIS — R1312 Dysphagia, oropharyngeal phase: Secondary | ICD-10-CM | POA: Insufficient documentation

## 2022-11-07 DIAGNOSIS — R1310 Dysphagia, unspecified: Secondary | ICD-10-CM | POA: Diagnosis not present

## 2022-11-07 DIAGNOSIS — K219 Gastro-esophageal reflux disease without esophagitis: Secondary | ICD-10-CM | POA: Diagnosis not present

## 2022-11-07 DIAGNOSIS — E785 Hyperlipidemia, unspecified: Secondary | ICD-10-CM | POA: Insufficient documentation

## 2022-11-07 DIAGNOSIS — C099 Malignant neoplasm of tonsil, unspecified: Secondary | ICD-10-CM | POA: Diagnosis not present

## 2022-11-07 DIAGNOSIS — I10 Essential (primary) hypertension: Secondary | ICD-10-CM | POA: Insufficient documentation

## 2022-11-07 HISTORY — PX: IR GASTROSTOMY TUBE MOD SED: IMG625

## 2022-11-07 LAB — BASIC METABOLIC PANEL
Anion gap: 9 (ref 5–15)
BUN: 30 mg/dL — ABNORMAL HIGH (ref 8–23)
CO2: 27 mmol/L (ref 22–32)
Calcium: 9.8 mg/dL (ref 8.9–10.3)
Chloride: 98 mmol/L (ref 98–111)
Creatinine, Ser: 1.1 mg/dL (ref 0.61–1.24)
GFR, Estimated: >60 mL/min (ref >60)
Glucose, Bld: 103 mg/dL — ABNORMAL HIGH (ref 70–99)
Potassium: 4.1 mmol/L (ref 3.5–5.1)
Sodium: 134 mmol/L — ABNORMAL LOW (ref 135–145)

## 2022-11-07 LAB — PROTIME-INR
INR: 1.2 (ref 0.8–1.2)
Prothrombin Time: 15 seconds (ref 11.4–15.2)

## 2022-11-07 LAB — CBC
HCT: 40.3 % (ref 39.0–52.0)
Hemoglobin: 12.7 g/dL — ABNORMAL LOW (ref 13.0–17.0)
MCH: 26.8 pg (ref 26.0–34.0)
MCHC: 31.5 g/dL (ref 30.0–36.0)
MCV: 85.2 fL (ref 80.0–100.0)
Platelets: 387 10*3/uL (ref 150–400)
RBC: 4.73 MIL/uL (ref 4.22–5.81)
RDW: 14.7 % (ref 11.5–15.5)
WBC: 7.3 10*3/uL (ref 4.0–10.5)
nRBC: 0 % (ref 0.0–0.2)

## 2022-11-07 MED ORDER — CEFAZOLIN SODIUM-DEXTROSE 2-4 GM/100ML-% IV SOLN
2.0000 g | INTRAVENOUS | Status: AC
  Administered 2022-11-07: 2 g via INTRAVENOUS

## 2022-11-07 MED ORDER — CEFAZOLIN SODIUM-DEXTROSE 2-4 GM/100ML-% IV SOLN
INTRAVENOUS | Status: AC
  Filled 2022-11-07: qty 100

## 2022-11-07 MED ORDER — FENTANYL CITRATE (PF) 100 MCG/2ML IJ SOLN
INTRAMUSCULAR | Status: AC | PRN
  Administered 2022-11-07: 50 ug via INTRAVENOUS
  Administered 2022-11-07: 25 ug via INTRAVENOUS

## 2022-11-07 MED ORDER — LIDOCAINE-EPINEPHRINE 1 %-1:100000 IJ SOLN
20.0000 mL | Freq: Once | INTRAMUSCULAR | Status: AC
  Administered 2022-11-07: 20 mL via INTRADERMAL

## 2022-11-07 MED ORDER — MIDAZOLAM HCL 2 MG/2ML IJ SOLN
INTRAMUSCULAR | Status: AC
  Filled 2022-11-07: qty 4

## 2022-11-07 MED ORDER — FENTANYL CITRATE (PF) 100 MCG/2ML IJ SOLN
INTRAMUSCULAR | Status: AC
  Filled 2022-11-07: qty 4

## 2022-11-07 MED ORDER — MIDAZOLAM HCL 2 MG/2ML IJ SOLN
INTRAMUSCULAR | Status: AC | PRN
  Administered 2022-11-07: 1 mg via INTRAVENOUS

## 2022-11-07 MED ORDER — LIDOCAINE-EPINEPHRINE 1 %-1:100000 IJ SOLN
INTRAMUSCULAR | Status: AC
  Filled 2022-11-07: qty 1

## 2022-11-07 MED ORDER — SODIUM CHLORIDE 0.9 % IV SOLN: INTRAVENOUS | Status: DC

## 2022-11-07 MED ORDER — IOHEXOL 300 MG/ML  SOLN
50.0000 mL | Freq: Once | INTRAMUSCULAR | Status: AC | PRN
  Administered 2022-11-07: 20 mL

## 2022-11-07 NOTE — Discharge Instructions (Signed)
Cleanse site with soap and water, dress with triple-antibiotic ointment, and cover with gauze for 7 days. Do not use scissors near the catheter.   

## 2022-11-07 NOTE — Telephone Encounter (Signed)
Scheduler will schedule infusion appts.

## 2022-11-07 NOTE — Telephone Encounter (Signed)
Spoke with patient confirming upcoming appointment  

## 2022-11-07 NOTE — H&P (Addendum)
Chief Complaint: Patient was seen in consultation today for dysphagia, aspiration pneumonia at the request of Tisovec,Richard W  Referring Physician(s): Tisovec,Richard W  Supervising Physician: Gilmer Mor  Patient Status: [facility] - Out-pt  History of Present Illness: Todd [name] is a 62 y.o. 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 Pt has c/o chronic dysphagia since summer 2023 with solid food, liquid and medications. He has also suffered from recurrent aspiration PNA. MBS 10/30/22 with speech showed persistent aspiration, Pt referred for gastrostomy tube placement. Imaging reviewed and approved by Dr. Lowella Dandy, IR.   Pt denies fever, chills, loss of appetite, SOB, CP, abd pain, N/V, dizziness, HA or weakness.  He endorses fatigue, weight loss and chronic cough.  He is NPO per order.  LD ASA was 10/31/22  Past Medical History:  Diagnosis Date   Acquired hypothyroidism    secondary to radiation //   followed by pcp   Arthritis    shoulders, neck, knees, feet   Bilateral sensorineural hearing loss    due to radiation   Decreased range of motion of neck    due to fibrotic changes of 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   Denervation atrophy of muscle    tongue ,  chronic ,  due to radiation injury   (neurologist-- dr aKate Sable ([facility] [place])  EMG in care everywhere done 03-11-2022   Frequency of urination    GERD (gastroesophageal reflux disease)    History of cancer chemotherapy 2017   tonsil cancer   03-24-2016  to 05-06-2016   History of external beam radiation therapy    03-24-2016  to 05-15-2016  Left Tonsil and Bilateral Neck  @ [facility]   Hoarseness, chronic    due to radiation   Hyperlipidemia, mixed    Hypertension    Inguinal hernia, left    Lower facial weakness     intermittant left lower facial weakness   Numbness and tingling in left arm    intermittently per pt   Oropharyngeal dysphagia    hx neck radiation for left tonsil cancer ;  Speech therapy, regular diet with precautions swallows okay   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   followed by ENT dr bates/  oncologist--- dr Bertis Ruddy;  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;  completed chemo 05-06-2016 and completed radiation of left tonsil/ bilateral neck 05-15-2016  w/ radiation injury to tongue (denervation chronic)   Wears glasses    Wears hearing aid in both ears     Past Surgical History:  Procedure Laterality Date   INGUINAL HERNIA REPAIR Left 05/02/2022   Procedure: OPEN LEFT INGUINAL HERNIA REPAIR WITH MESH;  Surgeon: Berna Bue, MD;  Location: [facility];  Service: General;  Laterality: Left;   IR GASTROSTOMY TUBE REMOVAL  01/30/2017   IR GENERIC HISTORICAL  03/20/2016   IR US GUIDE VASC ACCESS RIGHT 03/20/2016 Simonne Come, MD [facility]   IR GENERIC HISTORICAL  03/20/2016   IR FLUORO GUIDE PORT INSERTION RIGHT 03/20/2016 Simonne Come, MD [facility]   IR GENERIC HISTORICAL  03/20/2016   IR GASTROSTOMY TUBE MOD SED 03/20/2016 Simonne Come, MD [facility]   IR REMOVAL TUN ACCESS W/ PORT W/O FL MOD SED  01/30/2017    Allergies: Phenergan [promethazine hcl] and Sulfamethoxazole-trimethoprim  Medications: Prior to Admission medications   Medication Sig  Start Date End Date Taking? Authorizing Provider  acetaminophen (TYLENOL) 650 MG CR tablet Take 650 mg by mouth every 8 (eight) hours as needed for pain.   Yes [provider]  amLODipine (NORVASC) 10 MG tablet Take 1 tablet (10 mg total) by mouth daily. 08/24/17  Yes Gorsuch, Paula Compton, MD  Ascorbic Acid (VITAMIN C) 1000 MG tablet Take 200 mg by mouth daily.   Yes [provider]  cetirizine (ZYRTEC) 10 MG tablet Take 10 mg by mouth daily.   Yes [provider]  Coenzyme Q10 (COQ-10) 200 MG CAPS Take 1  capsule by mouth daily.   Yes [provider]  Famotidine (PEPCID PO) Take 1 tablet by mouth at bedtime.   Yes [provider]  fenofibrate (TRICOR) 145 MG tablet Take 145 mg by mouth daily.  12/03/15  Yes [provider]  glucosamine-chondroitin 500-400 MG tablet Take 2 tablets by mouth every morning.   Yes [provider]  levothyroxine (SYNTHROID) 125 MCG tablet Take 125 mcg by mouth daily before breakfast.   Yes [provider]  tamsulosin (FLOMAX) 0.4 MG CAPS capsule Take 0.4 mg by mouth daily.   Yes [provider]  albuterol (VENTOLIN HFA) 108 (90 Base) MCG/ACT inhaler Inhale 2 puffs into the lungs as needed. 02/11/22   [provider]  aspirin EC 81 MG tablet Take 81 mg by mouth daily. Swallow whole.    [provider]  guaiFENesin (MUCINEX) 600 MG 12 hr tablet Take 600 mg by mouth 2 (two) times daily as needed for cough or to loosen phlegm.    [provider]  Multiple Vitamin (MULTIVITAMIN) capsule Take 1 capsule by mouth daily. Patient not taking: Reported on 10/21/2022    [provider]     Family History  Problem Relation Age of Onset   Dementia Father    Diabetes Father    Cancer Maternal Grandmother        unknown ca   Asthma Son     Social History   Socioeconomic History   Marital status: Married    Spouse name: Arline Asp   Number of children: 3   Years of education: Not on file   Highest education level: Not on file  Occupational History   Occupation: Airline pilot rep    Comment: [employer]  Tobacco Use   Smoking status: Never   Smokeless tobacco: Never   Tobacco comments:    Per pt smoked from age 18 to age 2 socially but none since  Vaping Use   Vaping Use: Never used  Substance and Sexual Activity   Alcohol use: No   Drug use: Never   Sexual activity: Not on file  Other Topics Concern   Not on file  Social History Narrative   02/28/2016   Patient is divorced and  remarried. Patient had 2 daughters with his first wife and one daughter passed away due to SIDS. Patient has 2 stepchildren from his recent marriage. There ages are 82 and 11.   Patient smokes cigarettes from the ages of [age]. None since then. Patient never chewed tobacco. Patient with rare use of alcohol. Patient does not use other illicit drugs.   Social Determinants of Health   Financial Resource Strain: Not on file  Food Insecurity: Not on file  Transportation Needs: Not on file  Physical Activity: Not on file  Stress: Not on file  Social Connections: Not on file    Review of Systems: A 12 point ROS  discussed and pertinent positives are indicated in the HPI above.  All other systems are negative.  Review of Systems  Constitutional:  Positive for fatigue and unexpected weight change. Negative for appetite change, chills and fever.  Respiratory:  Positive for cough. Negative for shortness of breath.   Cardiovascular:  Negative for chest pain.  Gastrointestinal:  Negative for abdominal pain, nausea and vomiting.  Neurological:  Negative for dizziness, weakness and headaches.    Vital Signs: BP (!) 146/94   Pulse 70   Temp 98.2 F (36.8 C) (Oral)   Resp 15   Ht 5\' 9"  (1.753 m)   Wt 148 lb (67.1 kg)   SpO2 95%   BMI 21.86 kg/m   Pt is a FULL CODE STATUS  Physical Exam Vitals reviewed.  Constitutional:      General: He is not in acute distress.    Appearance: Normal appearance. He is ill-appearing.  HENT:     Head: Normocephalic and atraumatic.     Mouth/Throat:     Mouth: Mucous membranes are dry.     Pharynx: Oropharynx is clear.  Eyes:     Extraocular Movements: Extraocular movements intact.     Pupils: Pupils are equal, round, and reactive to light.  Cardiovascular:     Rate and Rhythm: Normal rate and regular rhythm.     Pulses: Normal pulses.     Heart sounds: Normal heart sounds.  Pulmonary:     Effort: Pulmonary effort is normal.     Breath sounds:  Normal breath sounds.  Abdominal:     General: Abdomen is flat. Bowel sounds are normal. There is no distension.     Palpations: Abdomen is soft.     Tenderness: There is no abdominal tenderness. There is no guarding.  Musculoskeletal:     Right lower leg: No edema.     Left lower leg: No edema.  Skin:    General: Skin is warm and dry.  Neurological:     Mental Status: He is alert.  Psychiatric:        Mood and Affect: Mood normal.        Behavior: Behavior normal.        Thought Content: Thought content normal.        Judgment: Judgment normal.     Imaging: DG SWALLOW FUNC SPEECH PATH  Result Date: 10/30/2022 Table formatting from the original result was not included. Modified Barium Swallow Study Patient Details Name: Todd [name] MRN: [id number] Date of Birth: 1960-12-30 Today's Date: 10/30/2022 HPI/PMH: HPI: Pt is a 62 yo male referred for OP MBS by Dr Basilio Cairo.  Pt PMH + for left tonsillar cancer s/p 40 XRTs 2017-2018 and chemo x3.  He previously had a PEG placed for nutrition during treatment and he reports it was removed approx six months following due to pt not needing it.  Pt reports significant worsening swallow function in the last 15 months, to extent currently that he requires IV fluid administration at [facility] for hydration.  Plan is for PEG in approx one week for nutrition and hydration.  Pt also with history significant for recurrent pneumonias *x3 in the last 1 1/2 years per pt - presumed aspiration pneumonias.  Dr Jenne Pane, ENT, saw pt in 09/2022 and noted lingual deviation to the left with lingual fasiculations as well as very little pharyngeal squeeze with effort, chink at vocal cords and secretion retention.  Pt plans to have cervical spine surgery at [facility] in the future for  cervical radiculopathy.  Prior MBS 03/2022 at [facility] showed moderate residual, penetration of thin, nectar and puree and aspiration of thin *silent*.  Compensations advised included chin tuck and  double swallows (partially effective) and throat clear/cough effective to clear aspirates/penetrates.     Pt has been maintained mostly on a liquid diet - consuming few very soft solids *eg pastas approx once a day.  He says he would like to just determine that he wants to eat something and do it *i.e. apple.  Denies requiring heimlich manuever nor having significant aspiration episodes prior to aspiration pneumonia events. Clinical Impression: Clinical Impression: Patient presents with mild oral and severe pharyngo-cervical esophageal dysphagia presumed due to iatrogenic effects of XRT that is significantly worse compared to prior MBS conducted by this SLP in 2018.   SLP reviewed [facility] from [facility] in 2023/11 but could not view images. 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  Head tilt to the right attempted to aid oral transiting/control with partial effectiveness.  Pharyngeal swallow marked by sevfere hypomotility resulting in minimal transiting of barium into esophagus and laryngeal penetration/aspiration. Mulitple dry swallows *pt initiated* is helpful to decrease amount of retention but does not fuly clear it, even after 5-6 swallows with small single liquid bolus.  Head of chair reclined to approx 45* with slight neck extension did decrease amount of penetration and aspiration as gravity faciliated retention of barium in posterior pharynx.  Even in posterior position and with use of right tongue for oral transiting, pt penetrated very small bite of masticated cracker after the swallow as pharyngeal retention spilled into open airway.  Cued cough effectively cleared cracker into pharynx for pt to swallow with liquids.  Todd into cervical esophagus was significantly impaired with gross retention of liquids as pyriform sinus.    At this time, pt is very high aspiration and malnutrition, dehydration risk with his current  level of dysphagia.  Note plan is for PEG next week *11/07/2022  per pt and SLP agrees this is warranted.  SLP reviewed MBS extensively with pt and reinforced most effective compensation strategies.  Encouraged pt to continue po with strict adherence to compensations including head of bed/chair reclined, chin elevated, multiple swallows per bolus, cough/throat clear and expectorate prn.   Question if he may benefit from intervention for PES opening to mitigate his dysphagia - advised he speak to Dr Jenne Pane, ENT, re: potential.  Unfortunately [facility] current dysphagia is profound and given progressive deficit (presumed from XRT effects, fibrosis) for 15 months, prognosis for swallow to return to functional level is guarded at best.    Advised pt consider ordering antichoking device for emergent use.  Thanks so much for this consult. Factors that may increase risk of adverse event in presence of aspiration Todd [name] & Todd [name] 2021): Factors that may increase risk of adverse event in presence of aspiration Todd [name] & Todd [name] 2021): Reduced saliva Recommendations/Plan: Swallowing Evaluation Recommendations Swallowing Evaluation Recommendations Recommendations: PO diet PO Diet Recommendation: Thin liquids (Level 0); Mildly thick liquids (Level 2, nectar thick); Full liquid diet, some soft solids Liquid Administration via: Cup; Straw; Spoon Medication Administration: Other (Comment) (consider suspension) Supervision: Patient able to self-feed Swallowing strategies  : effortful swallow; Multiple dry swallows after each bite/sip; Hard cough after swallowing; Head tilt right during swallowing; Slow rate; Small bites/sips (Recline approx 45* with slight chin elevation), Follow solids with liquids Postural changes: Partially reclined for meals Oral care recommendations: Oral  care QID (4x/day) Recommended consults: Consider ENT consultation (? ENT to determine if potential PES treatment may be helpful) Treatment Plan No data  recorded Recommendations Recommendations for follow up therapy are one component of a multi-disciplinary discharge planning process, led by the attending physician.  Recommendations may be updated based on patient status, additional functional criteria and insurance authorization. Assessment: Orofacial Exam: Orofacial Exam Oral Cavity: Oral Hygiene: Xerostomia Oral Cavity - Dentition: Adequate natural dentition Orofacial Anatomy: Other (comment) Oral Motor/Sensory Function: Suspected cranial nerve impairment CN V - Trigeminal: WFL CN VII - Facial: WFL (potential mild left labial weakness) CN IX - Glossopharyngeal, CN X - Vagus: Left motor impairment (no palatal elevation observed with phonation, some observed with swallowing) CN XII - Hypoglossal: Left motor impairment Anatomy: Anatomy: Other (Comment) Boluses Administered: Boluses Administered Boluses Administered: Thin liquids (Level 0); Mildly thick liquids (Level 2, nectar thick); Solid  Oral Impairment Domain: Oral Impairment Domain Lip Closure: No labial escape Tongue control during bolus hold: Cohesive bolus between tongue to palatal seal Bolus preparation/mastication: Slow prolonged chewing/mashing with complete recollection Bolus transport/lingual motion: Brisk tongue motion Oral residue: Trace residue lining oral structures; Residue collection on oral structures Location of oral residue : Tongue Initiation of pharyngeal swallow : Pyriform sinuses (larynx)  Pharyngeal Impairment Domain: Pharyngeal Impairment Domain Soft palate elevation: Escape to nasopharynx Laryngeal elevation: Minimal superior movement of thyroid cartilage with minimal approximation of arytenoids to epiglottic petiole Anterior hyoid excursion: Partial anterior movement Epiglottic movement: No inversion Laryngeal vestibule closure: Incomplete, narrow column air/contrast in laryngeal vestibule Pharyngeal stripping wave : Present - diminished Pharyngeal contraction (A/P view only): --  (diminished contraction noted) Pharyngoesophageal segment opening: Minimal distention/minimal duration, marked obstruction of flow Tongue base retraction: Wide column of contrast or air between tongue base and PPW Pharyngeal residue: Majority of contrast within or on pharyngeal structures; Collection of residue within or on pharyngeal structures Location of pharyngeal residue: Pyriform sinuses; Aryepiglottic folds; Pharyngeal wall; Valleculae; Tongue base; Diffuse (>3 areas)  Esophageal Impairment Domain: Esophageal Impairment Domain Esophageal Todd upright position: Complete Todd, esophageal coating Pill: No data recorded Penetration/Aspiration Scale Score: Penetration/Aspiration Scale Score 2.  Material enters airway, remains ABOVE vocal cords then ejected out: Solid 4.  Material enters airway, CONTACTS cords then ejected out: Mildly thick liquids (Level 2, nectar thick) 8.  Material enters airway, passes BELOW cords without attempt by patient to eject out (silent aspiration) : Thin liquids (Level 0) Compensatory Strategies: Compensatory Strategies Compensatory strategies: Yes Multiple swallows: Effective Effective Multiple Swallows: Thin liquid (Level 0); Mildly thick liquid (Level 2, nectar thick) Chin tuck: Ineffective Ineffective Chin Tuck: Thin liquid (Level 0) Liquid wash: Effective Effective Liquid Wash: Solid Right head tilt: Effective Effective Right Head Tilit: Thin liquid (Level 0); Solid; Mildly thick liquid (Level 2, nectar thick) Reclining posture: Effective Effective Reclining Posture: Thin liquid (Level 0); Mildly thick liquid (Level 2, nectar thick) Posterior head tilt: Effective Effective Posterior head tilt: Thin liquid (Level 0); Mildly thick liquid (Level 2, nectar thick)   General Information: Caregiver present: No  Diet Prior to this Study: Dysphagia 3 (mechanical soft); Thin liquids (Level 0)   Temperature : Normal   Respiratory Status: WFL   Supplemental O2: None (Room air)    History of Recent Intubation: No  Behavior/Cognition: Alert; Cooperative; Pleasant mood Self-Feeding Abilities: Able to self-feed Baseline vocal quality/speech: Other (comment) (mildly dysphonic, hypernasal) Volitional Cough: Able to elicit Volitional Swallow: Able to elicit No data recorded Goal Planning: No data recorded No data  recorded No data recorded No data recorded No data recorded Pain: Pain Assessment Pain Assessment: No/denies pain End of Session: Start Time:SLP Start Time (ACUTE ONLY): 1317 Stop Time: SLP Stop Time (ACUTE ONLY): 1400 Time Calculation:SLP Time Calculation (min) (ACUTE ONLY): 43 min Charges: SLP Evaluations $ SLP Speech Visit: 1 Visit SLP Evaluations $Outpatient MBS Swallow: 1 Procedure $Swallowing Treatment: 1 Procedure SLP visit diagnosis: SLP Visit Diagnosis: Dysphagia, oropharyngeal phase (R13.12); Dysphagia, pharyngoesophageal phase (R13.14) Past Medical History: Past Medical History: Diagnosis Date  Acquired hypothyroidism   secondary to radiation //   followed by pcp  Arthritis   shoulders, neck, knees, feet  Bilateral sensorineural hearing loss   due to radiation  Decreased range of motion of neck   due to fibrotic changes of 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 Kate Sable ([facility] [place])  EMG in care everywhere done 03-11-2022  Frequency of urination   GERD (gastroesophageal reflux disease)   History of cancer chemotherapy 2017  tonsil cancer   03-24-2016  to 05-06-2016  History of external beam radiation therapy   03-24-2016  to 05-15-2016  Left Tonsil and Bilateral Neck  @ [facility]  Hoarseness, chronic   due to radiation  Hyperlipidemia, mixed   Hypertension   Inguinal hernia, left   Lower facial weakness   intermittant left lower facial weakness  Numbness and tingling in left arm   intermittently per pt  Oropharyngeal dysphagia   hx neck radiation for left tonsil cancer ;   Speech therapy, regular diet with precautions swallows okay  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  followed by ENT dr bates/  oncologist--- dr Bertis Ruddy;  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;  completed chemo 05-06-2016 and completed radiation of left tonsil/ bilateral neck 05-15-2016  w/ radiation injury to tongue (denervation chronic)  Wears glasses   Wears hearing aid in both ears  Past Surgical History: Past Surgical History: Procedure Laterality Date  INGUINAL HERNIA REPAIR Left 05/02/2022  Procedure: OPEN LEFT INGUINAL HERNIA REPAIR WITH MESH;  Surgeon: Berna Bue, MD;  Location: [facility] [place];  Service: General;  Laterality: Left;  IR GASTROSTOMY TUBE REMOVAL  01/30/2017  IR GENERIC HISTORICAL  03/20/2016  IR US GUIDE VASC ACCESS RIGHT 03/20/2016 Simonne Come, MD [facility]  IR GENERIC HISTORICAL  03/20/2016  IR FLUORO GUIDE PORT INSERTION RIGHT 03/20/2016 Simonne Come, MD [facility]  IR GENERIC HISTORICAL  03/20/2016  IR GASTROSTOMY TUBE MOD SED 03/20/2016 Simonne Come, MD [facility]  IR REMOVAL TUN ACCESS W/ PORT W/O FL MOD SED  01/30/2017 Rolena Infante, MS [facility] SLP Acute Rehab Services Office [phone] Chales Abrahams 10/30/2022, 5:09 PM    Labs:  CBC: Recent Labs    05/02/22 0907 08/26/22 1219 11/07/22 1232  WBC  --  8.3 7.3  HGB 13.9 11.7* 12.7*  HCT 41.0 34.6* 40.3  PLT  --  374 387    COAGS: Recent Labs    11/07/22 1232  INR 1.2    BMP: Recent Labs    05/02/22 0907 08/26/22 1219 11/07/22 1232  NA 140 138 134*  K 4.0 4.3 4.1  CL 101 102 98  CO2  --  32 27  GLUCOSE 101* 108* 103*  BUN 22 29* 30*  CALCIUM  --  10.0 9.8  CREATININE 1.20 1.08 1.10  GFRNONAA  --  >60 >60  LIVER FUNCTION TESTS: Recent Labs    08/26/22 1219  BILITOT 0.3  AST 23  ALT 16  ALKPHOS 43  PROT 7.2  ALBUMIN 4.0    TUMOR MARKERS: No results for input(s): "AFPTM", "CEA", "CA199", "CHROMGRNA" in the last 8760 hours.  Assessment and Plan:  62 yo male with PMHX  significant for squamous cell tonsillar cancer, GERD, chemoradiation, HLD, HTN, aspiration pneumonia and oropharyngeal dysphagia presents to IR for gastrostomy tube placement.   Pt sitting upright in bed.  He is A&O, calm and pleasant.  He is in no distress.   Risks and benefits image guided gastrostomy tube placement was discussed with the patient including, but not limited to the need for a barium enema during the procedure, bleeding, infection, peritonitis and/or damage to adjacent structures.  All of the patient's questions were answered, patient is agreeable to proceed.  Consent signed and in chart.  Thank you for this interesting consult.  I greatly enjoyed meeting Todd [name] and look forward to participating in their care.  A copy of this report was sent to the requesting provider on this date.  Electronically Signed: Shon Hough, NP 11/07/2022, 2:07 PM   I spent a total of 20 minutes in face to face in clinical consultation, greater than 50% of which was counseling/coordinating care for dysphagia, aspiration pneumonia.

## 2022-11-07 NOTE — Procedures (Signed)
Interventional Radiology Procedure Note  Procedure: Placement of percutaneous 33F balloon-retention gastrostomy tube.  Complications: None  EBL:  < 5 mL  Recommendations: - NPO for 4 hours after placement - May advance diet as tolerated and begin using tube 4 hours after placement - Gastropexy sutures are absorbable and do not require removal  Marliss Coots, MD Pager: [phone]

## 2022-11-08 ENCOUNTER — Inpatient Hospital Stay: Payer: BC Managed Care – PPO

## 2022-11-08 VITALS — BP 113/99 | HR 76 | Temp 98.1°F | Resp 20

## 2022-11-08 DIAGNOSIS — Z85810 Personal history of malignant neoplasm of tongue: Secondary | ICD-10-CM | POA: Diagnosis not present

## 2022-11-08 DIAGNOSIS — C099 Malignant neoplasm of tonsil, unspecified: Secondary | ICD-10-CM

## 2022-11-08 DIAGNOSIS — R634 Abnormal weight loss: Secondary | ICD-10-CM | POA: Diagnosis not present

## 2022-11-08 MED ORDER — SODIUM CHLORIDE 0.9 % IV SOLN: Freq: Once | INTRAVENOUS | Status: AC

## 2022-11-08 NOTE — Patient Instructions (Signed)

## 2022-11-11 ENCOUNTER — Other Ambulatory Visit (HOSPITAL_COMMUNITY): Payer: Self-pay

## 2022-11-11 ENCOUNTER — Ambulatory Visit: Payer: BC Managed Care – PPO

## 2022-11-11 DIAGNOSIS — R1312 Dysphagia, oropharyngeal phase: Secondary | ICD-10-CM

## 2022-11-11 DIAGNOSIS — R471 Dysarthria and anarthria: Secondary | ICD-10-CM | POA: Insufficient documentation

## 2022-11-11 DIAGNOSIS — C099 Malignant neoplasm of tonsil, unspecified: Secondary | ICD-10-CM | POA: Insufficient documentation

## 2022-11-11 NOTE — Therapy (Signed)
[place] [facility] [address] [place], [place], [address] Phone: [phone]   Fax:  [phone]  Patient Details  Name: Todd [name] MRN: [id number] Date of Birth: [birth year] Referring Provider:  Lonie Peak, MD  Encounter Date: 11/11/2022  Arrive-Cancel  Pt and wife arrived - pt reported to SLP he thought his condition has worsened in the last 5-6 days. Pt stated, "Now it's a struggle to get water down."  Pt has lost more weight since PEG insertion Friday. Long discussion about how pt should proceed with this situation at this time - SLP suggested consulting with Drs. Filomena Jungling, PCP, or with Dr. Jenne Pane about a nutrition consult, and about which MD to order equipment for tube feeds. By this time, approximately 25 minutes had passed and SLP did not have adequate time to complete ST eval for swallowing. Pt will reschedule.   SLP, due to pt's situation, reviewed MBS recommendations (dated 10/30/22) and strongly suggested pt cont with H2O after meticulous oral care <30 minutes prior to inhibit muscle disuse atrophy, while following aspiration precautions provided at 10/30/22 MBS.  Given pt's report of difficulty with water SLP suggested small sips along with other aspiration precautions on 10/30/22 MBS.   [facility], CCC-SLP 11/11/2022, 5:24 PM  [place] [facility] [address], [address] [place], [place], [address] Phone: [phone]   Fax:  [phone]

## 2022-11-12 ENCOUNTER — Telehealth: Payer: Self-pay

## 2022-11-12 NOTE — Telephone Encounter (Signed)
Called wife and instructed to call the week of 7/15 to give up date on how Todd [name] is doing with IV fluids. So more IV fluids can be scheduled if needed. She verbalized understanding.  Per wife Dr. Wylene [name] ordered peg tube and sent referral to [facility] for tube feedings. Per wife, Todd [name] is doing pain management and unable to have surgery with neurosurgeon at [facility]. He has lost about 5 lbs since peg placed and currently taking antibiotic for fever. He saw GI at [facility] and is unable to have procedure for stricture due to being too weak. Forwarded above message to Dr. Bertis [name].

## 2022-11-14 ENCOUNTER — Ambulatory Visit: Payer: BC Managed Care – PPO | Admitting: Dietician

## 2022-11-14 ENCOUNTER — Other Ambulatory Visit: Payer: Self-pay

## 2022-11-14 ENCOUNTER — Inpatient Hospital Stay: Payer: BC Managed Care – PPO | Attending: Hematology and Oncology

## 2022-11-14 VITALS — BP 162/98 | HR 69 | Resp 16

## 2022-11-14 DIAGNOSIS — R634 Abnormal weight loss: Secondary | ICD-10-CM | POA: Insufficient documentation

## 2022-11-14 DIAGNOSIS — Z85810 Personal history of malignant neoplasm of tongue: Secondary | ICD-10-CM | POA: Insufficient documentation

## 2022-11-14 DIAGNOSIS — E43 Unspecified severe protein-calorie malnutrition: Secondary | ICD-10-CM | POA: Diagnosis not present

## 2022-11-14 DIAGNOSIS — C099 Malignant neoplasm of tonsil, unspecified: Secondary | ICD-10-CM

## 2022-11-14 MED ORDER — SODIUM CHLORIDE 0.9 % IV SOLN: Freq: Once | INTRAVENOUS | Status: AC

## 2022-11-14 NOTE — Progress Notes (Signed)
Nutrition Follow-up:  Patient completed concurrent chemoradiation for tonsil cancer. Final radiation 05/15/16 under the care of Dr. Basilio Cairo. Patient now with dysphagia and recurrent aspiration pneumonia.    Noted Neck MRI (09/15/22) negative for recurrent disease  S/p PEG - 6/28 (PCP)  Met with patient and wife in infusion. He is receiving supportive therapy with IV fluids. Patient is NPO other than small sips of water per SLP. He reports tolerating bolus feedings well. Patient requires assistance for feedings as tube does not have clamp. He has decreased hand grip strength, unable to pinch stiff tubing. Wife reports needing pliers to unscrew Enfit top. Patient is awaiting delivery of pump for overnight feeds. This will arrive this evening per wife. Wife reports working with [facility] [address]). Per wife patient prescribed 5 cartons Boost VHC via tube (provides 2650 kcal, 110 g) to promote wt gain. He is giving ~48 ounces FWF/day. Wife reports [facility] following weekly via telephone.     Medications: reviewed   Labs: 6/28 labs reviewed - Na 134, BUN 30  Anthropometrics: Last wt 148 lb on 6/28  5/17 - 152 lb 12.8 oz   Estimated Energy Needs  Kcals: 2210-2575 Protein: 96-110 Fluid: > 2 L  NUTRITION DIAGNOSIS: Inadequate oral intake continues - addressing with TF   MALNUTRITION DIAGNOSIS: Severe malnutrition continues - addressing with TF   INTERVENTION:  Continue regimen as prescribed per [facility] RD Diet advancement per SLP   MONITORING, EVALUATION, GOAL: weight trends, diet advancement  NEXT VISIT: To be scheduled as needed. Patient has contact information.

## 2022-11-14 NOTE — Patient Instructions (Signed)

## 2022-11-15 ENCOUNTER — Inpatient Hospital Stay: Payer: BC Managed Care – PPO

## 2022-11-19 ENCOUNTER — Other Ambulatory Visit: Payer: Self-pay

## 2022-11-19 ENCOUNTER — Inpatient Hospital Stay: Payer: BC Managed Care – PPO

## 2022-11-19 VITALS — BP 148/93 | HR 73 | Resp 18

## 2022-11-19 DIAGNOSIS — R634 Abnormal weight loss: Secondary | ICD-10-CM

## 2022-11-19 DIAGNOSIS — E43 Unspecified severe protein-calorie malnutrition: Secondary | ICD-10-CM | POA: Diagnosis not present

## 2022-11-19 DIAGNOSIS — C099 Malignant neoplasm of tonsil, unspecified: Secondary | ICD-10-CM

## 2022-11-19 DIAGNOSIS — R1312 Dysphagia, oropharyngeal phase: Secondary | ICD-10-CM | POA: Diagnosis not present

## 2022-11-19 DIAGNOSIS — Z85810 Personal history of malignant neoplasm of tongue: Secondary | ICD-10-CM | POA: Diagnosis not present

## 2022-11-19 MED ORDER — SODIUM CHLORIDE 0.9 % IV SOLN: Freq: Once | INTRAVENOUS | Status: AC

## 2022-11-19 NOTE — Patient Instructions (Signed)

## 2022-11-21 DIAGNOSIS — Z85818 Personal history of malignant neoplasm of other sites of lip, oral cavity, and pharynx: Secondary | ICD-10-CM | POA: Diagnosis not present

## 2022-11-21 DIAGNOSIS — Y842 Radiological procedure and radiotherapy as the cause of abnormal reaction of the patient, or of later complication, without mention of misadventure at the time of the procedure: Secondary | ICD-10-CM | POA: Diagnosis not present

## 2022-11-21 DIAGNOSIS — R634 Abnormal weight loss: Secondary | ICD-10-CM | POA: Diagnosis not present

## 2022-11-21 DIAGNOSIS — R1310 Dysphagia, unspecified: Secondary | ICD-10-CM | POA: Diagnosis not present

## 2022-11-22 ENCOUNTER — Inpatient Hospital Stay: Payer: BC Managed Care – PPO

## 2022-11-22 VITALS — BP 147/95 | HR 59 | Temp 97.3°F | Resp 16

## 2022-11-22 DIAGNOSIS — C099 Malignant neoplasm of tonsil, unspecified: Secondary | ICD-10-CM

## 2022-11-22 DIAGNOSIS — E43 Unspecified severe protein-calorie malnutrition: Secondary | ICD-10-CM | POA: Diagnosis not present

## 2022-11-22 DIAGNOSIS — R634 Abnormal weight loss: Secondary | ICD-10-CM | POA: Diagnosis not present

## 2022-11-22 DIAGNOSIS — Z85810 Personal history of malignant neoplasm of tongue: Secondary | ICD-10-CM | POA: Diagnosis not present

## 2022-11-22 MED ORDER — SODIUM CHLORIDE 0.9 % IV SOLN: Freq: Once | INTRAVENOUS | Status: AC

## 2022-11-24 ENCOUNTER — Ambulatory Visit: Payer: BC Managed Care – PPO | Attending: Radiation Oncology

## 2022-11-24 ENCOUNTER — Other Ambulatory Visit: Payer: Self-pay

## 2022-11-24 ENCOUNTER — Telehealth: Payer: Self-pay

## 2022-11-24 DIAGNOSIS — R471 Dysarthria and anarthria: Secondary | ICD-10-CM | POA: Diagnosis not present

## 2022-11-24 DIAGNOSIS — C099 Malignant neoplasm of tonsil, unspecified: Secondary | ICD-10-CM | POA: Diagnosis not present

## 2022-11-24 DIAGNOSIS — R1312 Dysphagia, oropharyngeal phase: Secondary | ICD-10-CM | POA: Diagnosis not present

## 2022-11-24 NOTE — Therapy (Signed)
OUTPATIENT SPEECH LANGUAGE PATHOLOGY SWALLOW EVALUATION   Patient Name: Todd [name] MRN: [id number] DOB:14-Dec-1960, 62 y.o., male Today's Date: 11/24/2022  PCP: Hiram Comber, MD REFERRING PROVIDER: Lonie Peak, MD  END OF SESSION:  End of Session - 11/24/22 2003     Visit Number 1    Number of Visits 17    Date for SLP Re-Evaluation 01/30/23    SLP Start Time 1451    SLP Stop Time  1531    SLP Time Calculation (min) 40 min    Activity Tolerance Patient tolerated treatment well             Past Medical History:  Diagnosis Date   Acquired hypothyroidism    secondary to radiation //   followed by pcp   Arthritis    shoulders, neck, knees, feet   Bilateral sensorineural hearing loss    due to radiation   Decreased range of motion of neck    due to fibrotic changes of 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   Denervation atrophy of muscle    tongue ,  chronic ,  due to radiation injury   (neurologist-- dr aKate Sable ([facility])  EMG in care everywhere done 03-11-2022   Frequency of urination    GERD (gastroesophageal reflux disease)    History of cancer chemotherapy 2017   tonsil cancer   03-24-2016  to 05-06-2016   History of external beam radiation therapy    03-24-2016  to 05-15-2016  Left Tonsil and Bilateral Neck  @ [facility]   Hoarseness, chronic    due to radiation   Hyperlipidemia, mixed    Hypertension    Inguinal hernia, left    Lower facial weakness    intermittant left lower facial weakness   Numbness and tingling in left arm    intermittently per pt   Oropharyngeal dysphagia    hx neck radiation for left tonsil cancer ;  Speech therapy, regular diet with precautions swallows okay   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   followed by ENT dr bates/  oncologist--- dr Bertis Ruddy;  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;  completed chemo 05-06-2016 and completed radiation of left tonsil/ bilateral neck 05-15-2016  w/ radiation injury to tongue  (denervation chronic)   Wears glasses    Wears hearing aid in both ears    Past Surgical History:  Procedure Laterality Date   INGUINAL HERNIA REPAIR Left 05/02/2022   Procedure: OPEN LEFT INGUINAL HERNIA REPAIR WITH MESH;  Surgeon: Berna Bue, MD;  Location: [facility];  Service: General;  Laterality: Left;   IR GASTROSTOMY TUBE MOD SED  11/07/2022   IR GASTROSTOMY TUBE REMOVAL  01/30/2017   IR GENERIC HISTORICAL  03/20/2016   IR US GUIDE VASC ACCESS RIGHT 03/20/2016 Simonne Come, MD [facility]   IR GENERIC HISTORICAL  03/20/2016   IR FLUORO GUIDE PORT INSERTION RIGHT 03/20/2016 Simonne Come, MD [facility]   IR GENERIC HISTORICAL  03/20/2016   IR GASTROSTOMY TUBE MOD SED 03/20/2016 Simonne Come, MD [facility]   IR REMOVAL TUN ACCESS W/ PORT W/O FL MOD SED  01/30/2017   Patient Active Problem List   Diagnosis Date Noted   Pulmonary infiltrates 09/08/2022   Hypothyroidism    Right hip pain 12/06/2018   CKD (chronic kidney disease), stage III (HCC) 11/30/2017   Anemia, chronic disease 11/30/2017   Multiple lung nodules on CT 09/08/2016   Pancytopenia, acquired (HCC) 05/26/2016   Deficiency anemia 05/26/2016  Splenic lesion 05/16/2016   Jaw pain 04/08/2016   Essential hypertension 03/18/2016   Weight loss 02/26/2016   Tonsil cancer (HCC) 02/21/2016    ONSET DATE: 2018   REFERRING DIAG: C09.9 (ICD-10-CM) - Tonsil cancer   THERAPY DIAG:  Dysphagia, oropharyngeal phase  Dysarthria and anarthria  Rationale for Evaluation and Treatment: Rehabilitation  SUBJECTIVE:   SUBJECTIVE STATEMENT: "I've been having a bottle of water a day since I was here." Pt accompanied by: significant other  PERTINENT HISTORY: From Dr. Basilio Cairo note May 2024: The patient returns today for follow-up after he approached me with new symptoms at our head and neck annual survivorship celebration.  Mr. [name] presents for follow up of radiation completed 05/15/16 to his Tonsillar  fossa/oropharynx.  Recent PET and MRI below were reviewed at tumor board, NED.  See below. 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  He is also losing weight.  PAIN:  Are you having pain? No  FALLS: Has patient fallen in last 6 months?  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 SLP reviewed note from [facility] in 2023/11 but could not view images. 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 Head tilt to the right attempted to aid oral transiting/control with partial effectiveness. Pharyngeal swallow marked by sevfere hypomotility resulting in minimal transiting of barium into esophagus and laryngeal penetration/aspiration. Mulitple dry swallows *pt initiated* is helpful to decrease amount of retention but does not fuly clear it, even after 5-6 swallows with small single liquid bolus. Head of chair reclined to approx 45* with slight neck extension did decrease amount of penetration and aspiration as gravity faciliated retention of barium in posterior pharynx. Even in  posterior position and with use of right tongue for oral transiting, pt penetrated very small bite of masticated cracker after the swallow as pharyngeal retention spilled into open airway. Cued cough effectively cleared cracker into pharynx for pt to swallow with liquids. Clearance into cervical esophagus was significantly impaired with gross retention of liquids as pyriform sinus. Question if he may benefit from intervention for PES opening to mitigate his dysphagia - advised he speak to Dr Jenne Pane, ENT, re: potential.   Unfortunately Mr [facility] current dysphagia is profound and given progressive deficit (presumed from XRT effects, fibrosis) for 15 months, prognosis for swallow to return to functional level is guarded at best.    Advised pt consider ordering antichoking device for emergent use.  Recommended diet:PO diet PO Diet Recommendation: Thin liquids (Level 0); Mildly thick liquids (Level 2, nectar thick); Full liquid diet, some soft solids Liquid Administration via: Cup; Straw; Spoon Medication Administration: Other (Comment) (consider suspension). Supervision: Patient able to self-feed Recommended compensations:  Swallowing strategies  :  effortful swallow; Multiple dry swallows after each bite/sip; Hard cough after swallowing; Head tilt right during swallowing; Slow rate; Small bites/sips (Recline approx 45* with slight chin elevation), Follow solids with liquids Postural changes: Partially reclined for meals Oral care recommendations: Oral care QID (4x/day) Recommended consults: Consider ENT consultation (? ENT to determine if potential PES treatment may be helpful)  COGNITION: Overall cognitive status: Within functional limits for tasks assessed  ORAL MOTOR EXAMINATION: Overall status: Impaired: Labial: Left (Symmetry) Lingual: Right (ROM, Symmetry, and Coordination) Velum: ROM Comments: Mild labial ROM decr'd on lt; Could not assess lingual strength due to decr'd ROM/lateralization,  but suspect ROM is decr'd due to pt's level of dysarthria. 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  CLINICAL SWALLOW ASSESSMENT:   Current diet: thin liquids Dentition: adequate natural dentition Patient directly observed with POs: No due to no oral care prior to ST eval Feeding: able to feed self Liquids provided by: cup with POs  PATIENT REPORTED OUTCOME MEASURES (PROM): EAT-10: to be provided in first 2 sessions   TODAY'S TREATMENT:                                                                                                                                         DATE:  11/24/22:  Today SLP explained results of pt's MBS and the rationale for the safe swallow strategies. Pt demonstrated understanding of each and reports he has been using these with one bottle of water each day. No overt s/sx of aspiration PNA to date with >1 week of PO water. Pt has NOT been performing oral care prior to POs. SLP educated pt re: swallow HEP procedure. Pt performed each exercise and SLP ensured correctness prior to moving to next exercise. Pt req'd min A with strength of cough with supraglottic.  PATIENT EDUCATION: Education details: see "today's treatment" Person educated: Patient and Spouse Education method: Explanation, Demonstration, Verbal cues, and Handouts Education comprehension: verbalized understanding, returned demonstration, verbal cues required, and needs further education   ASSESSMENT:  CLINICAL IMPRESSION: Patient is a 62 y.o. M who was seen today for assessment and follow up to MBS dated 10/30/22. SLP will implement swallowing HEP and ensure pt is safe and correct with safe swallow strategies. If progress is seen with HEP after 6 weeks- 8 weeks, he shold be referred for follow-up MBS to monitor progress. It should be noted that pt's prognosis after MBS last month for improvement to a functional swallow was GUARDED, however Baltazar was amenable to  complete swallow HEP and with improvement, to complete another MBS at a clinically appropriate time.  At this time pt's speech is less concerning however pt may benefit from a ST course for mastering speech compensations to improve speech intelligibility.  OBJECTIVE IMPAIRMENTS: include dysarthria and dysphagia. These impairments are limiting patient from ADLs/IADLs, effectively communicating at home  and in community, and safety when swallowing. Factors affecting potential to achieve goals and functional outcome are co-morbidities and severity of impairments. Patient will benefit from skilled SLP services to address above impairments and improve overall function.  REHAB POTENTIAL: Fair given prognosis from MBS   GOALS: Goals reviewed with patient? Yes  SHORT TERM GOALS: Target date: 12/25/22  Pt will perform HEP with rare min A in two sessions Baseline: Goal status: INITIAL  2.  Pt will follow prescribed safe swallow strategies in 3 sessions Baseline:  Goal status: INITIAL  3.  Pt will perform HEP 6/7 days a week for two weeks, per HEP monitoring method Baseline:  Goal status: INITIAL  4.  Pt and/or wife will ID 3 overt s/sx aspiration PNA with modified independence Baseline:  Goal status: INITIAL   LONG TERM GOALS: Target date: 01/30/23  Pt will perform HEP with rare min A in two sessions Baseline:  Goal status: INITIAL  2.  Pt will follow prescribed safe swallow strategies in 3 sessions Baseline:  Goal status: INITIAL  3.  Pt will undergo follow up MBS when/if clinically indicated Baseline:  Goal status: INITIAL  4.  Pt will score higher on PROM than initial score Baseline:  Goal status: INITIAL   PLAN:  SLP FREQUENCY: 1-2x/week  SLP DURATION: 8 weeks  PLANNED INTERVENTIONS: Aspiration precaution training, Pharyngeal strengthening exercises, Diet toleration management , Environmental controls, Trials of upgraded texture/liquids, Cueing hierachy,  Internal/external aids, SLP instruction and feedback, Compensatory strategies, Patient/family education, and Repeat MBS PRN    Tangy Drozdowski, CCC-SLP 11/24/2022, 8:05 PM

## 2022-11-24 NOTE — Telephone Encounter (Signed)
Wife called and left a message. They feel the IV fluids has been really helpful. They would like to continue IV fluids for the month of August on Wednesdays/ Saturdays.

## 2022-11-24 NOTE — Telephone Encounter (Signed)
Sent scheduling message to scheduled August IV fluids 2 x a week.

## 2022-11-24 NOTE — Telephone Encounter (Signed)
Please send scheduling msg

## 2022-11-24 NOTE — Patient Instructions (Addendum)
   SWALLOWING EXERCISES Do these for 8 weeks, then 2-3 times per week afterwards indefinitely Don't do any less than 5 reps at once; goal for 1,2, and 5 is 25-30 reps/day  Effortful Swallows - Press your tongue against the roof of your mouth for 3 seconds, then squeeze the muscles in your neck while you swallow your saliva or a sip of water - Repeat 5-15 times *use a wet spoon or a sip (After oral care)*  Masako Swallow - swallow with your tongue sticking out - Stick tongue out past your lips and gently bite tongue with your teeth - Swallow, while holding your tongue with your teeth - Repeat 5-15 times  *use a wet spoon only*  Chin Tuck exercise  - Press down HARD on the ball held under your chin for 15-30 SECONDS - Repeat 10 times  Tongue Stretch/Teeth Clean - Move your tongue around the pocket between your gums and teeth, clockwise and then counter-clockwise - Repeat on the back side, clockwise and then counter-clockwise - Repeat 10-15 times 2-3 times a day       5.  "Super Swallow"  - Take a breath and hold it   (sip here if necessary)  - Swallow then IMMEDIATELY cough  - Repeat 5-15 times  *use a sip (after oral care)    WHEN YOU HAVE YOUR BOTTLE OF WATER: Recline approx 45* with slight chin elevation Head tilt right during swallowing Effortful swallow Multiple dry swallows after each bite/sip Hard cough and re-swallow Slow rate Small bites/sips   =========================== Follow solids with liquids

## 2022-11-26 ENCOUNTER — Inpatient Hospital Stay: Payer: BC Managed Care – PPO

## 2022-11-26 VITALS — BP 153/64 | HR 67 | Temp 98.7°F | Resp 19

## 2022-11-26 DIAGNOSIS — C099 Malignant neoplasm of tonsil, unspecified: Secondary | ICD-10-CM

## 2022-11-26 DIAGNOSIS — E43 Unspecified severe protein-calorie malnutrition: Secondary | ICD-10-CM | POA: Diagnosis not present

## 2022-11-26 DIAGNOSIS — Z85810 Personal history of malignant neoplasm of tongue: Secondary | ICD-10-CM | POA: Diagnosis not present

## 2022-11-26 DIAGNOSIS — R634 Abnormal weight loss: Secondary | ICD-10-CM | POA: Diagnosis not present

## 2022-11-26 MED ORDER — SODIUM CHLORIDE 0.9 % IV SOLN: Freq: Once | INTRAVENOUS | Status: AC

## 2022-11-26 NOTE — Progress Notes (Signed)
Oncology Nurse Navigator Documentation   I met Mr. Redler in the infusion area today while he was receiving his IV hydration. I removed the final T-tac remaining to his PEG site without difficulty. The area was clean, dry, and intact without sign of infection. Mr. Ferrentino and his wife know to call me if I can assist them in anyway.  Hedda Slade RN, BSN, OCN Head & Neck Oncology Nurse Navigator [facility] at [facility] Phone # [phone]  Fax # [phone]

## 2022-11-26 NOTE — Patient Instructions (Signed)

## 2022-11-29 ENCOUNTER — Other Ambulatory Visit: Payer: Self-pay

## 2022-11-29 ENCOUNTER — Inpatient Hospital Stay: Payer: BC Managed Care – PPO

## 2022-11-29 VITALS — BP 106/60 | HR 56 | Temp 97.5°F | Resp 17

## 2022-11-29 DIAGNOSIS — Z85810 Personal history of malignant neoplasm of tongue: Secondary | ICD-10-CM | POA: Diagnosis not present

## 2022-11-29 DIAGNOSIS — C099 Malignant neoplasm of tonsil, unspecified: Secondary | ICD-10-CM

## 2022-11-29 DIAGNOSIS — E43 Unspecified severe protein-calorie malnutrition: Secondary | ICD-10-CM | POA: Diagnosis not present

## 2022-11-29 DIAGNOSIS — R634 Abnormal weight loss: Secondary | ICD-10-CM | POA: Diagnosis not present

## 2022-11-29 MED ORDER — SODIUM CHLORIDE 0.9 % IV SOLN: Freq: Once | INTRAVENOUS | Status: AC

## 2022-11-29 NOTE — Patient Instructions (Signed)
Dehydration, Adult Dehydration is a condition in which there is not enough water or other fluids in the body. This happens when a person loses more fluids than they take in. Important organs cannot work right without the right amount of fluids. Any loss of fluids from the body can cause dehydration. Dehydration can be mild, worse, or very bad. It should be treated right away to keep it from getting very bad. What are the causes? Conditions that cause loss of water in the body. They include: Watery poop (diarrhea). Vomiting. Sweating a lot. Fever. Infection. Peeing (urinating) a lot. Not drinking enough fluids. Certain medicines, such as medicines that take extra fluid out of the body (diuretics). Lack of safe drinking water. Not being able to get enough water and food. What increases the risk? Having a long-term (chronic) illness that has not been treated the right way, such as: Diabetes. Heart disease. Kidney disease. Being 65 years of age or older. Having a disability. Living in a place that is high above the ground or sea (high in altitude). The thinner, drier air causes more fluid loss. Doing exercises that put stress on your body for a long time. Being active when in hot places. What are the signs or symptoms? Symptoms of dehydration depend on how bad it is. Mild or worse dehydration Thirst. Dry lips or dry mouth. Feeling dizzy or light-headed. Muscle cramps. Passing little pee or dark pee. Pee may be the color of tea. Headache. Very bad dehydration Changes in skin. Skin may: Be cold to the touch (clammy). Be blotchy or pale. Not go back to normal right after you pinch it and let it go. Little or no tears, pee, or sweat. Fast breathing. Low blood pressure. Weak pulse. Pulse that is more than 100 beats a minute when you are sitting still. Other changes, such as: Feeling very thirsty. Eyes that look hollow (sunken). Cold hands and feet. Being confused. Being very  tired (lethargic) or having trouble waking from sleep. Losing weight. Loss of consciousness. How is this treated? Treatment for this condition depends on how bad your dehydration is. Treatment should start right away. Do not wait until your condition gets very bad. Very bad dehydration is an emergency. You will need to go to a hospital. Mild or worse dehydration can be treated at home. You may be asked to: Drink more fluids. Drink an oral rehydration solution (ORS). This drink gives you the right amount of fluids, salts, and minerals (electrolytes). Very bad dehydration can be treated: With fluids through an IV tube. By correcting low levels of electrolytes in the body. By treating the problem that caused your dehydration. Follow these instructions at home: Oral rehydration solution If told by your doctor, drink an ORS: Make an ORS. Use instructions on the package. Start by drinking small amounts, about  cup (120 mL) every 5-10 minutes. Slowly drink more until you have had the amount that your doctor said to have.  Eating and drinking  Drink enough clear fluid to keep your pee pale yellow. If you were told to drink an ORS, finish the ORS first. Then, start slowly drinking other clear fluids. Drink fluids such as: Water. Do not drink only water. Doing that can make the salt (sodium) level in your body get too low. Water from ice chips you suck on. Fruit juice that you have added water to (diluted). Low-calorie sports drinks. Eat foods that have the right amounts of salts and minerals, such as bananas, oranges, potatoes,   tomatoes, or spinach. Do not drink alcohol. Avoid drinks that have caffeine or sugar. These include:: High-calorie sports drinks. Fruit juice that you did not add water to. Soda. Coffee or energy drinks. Avoid foods that are greasy or have a lot of fat or sugar. General instructions Take over-the-counter and prescription medicines only as told by your doctor. Do  not take sodium tablets. Doing that can make the salt level in your body get too high. Return to your normal activities as told by your doctor. Ask your doctor what activities are safe for you. Keep all follow-up visits. Your doctor may check and change your treatment. Contact a doctor if: You have pain in your belly (abdomen) and the pain: Gets worse. Stays in one place. You have a rash. You have a stiff neck. You get angry or annoyed more easily than normal. You are more tired or have a harder time waking than normal. You feel weak or dizzy. You feel very thirsty. Get help right away if: You have any symptoms of very bad dehydration. You vomit every time you eat or drink. Your vomiting gets worse, does not go away, or you vomit blood or green stuff. You are getting treatment, but symptoms are getting worse. You have a fever. You have a very bad headache. You have: Diarrhea that gets worse or does not go away. Blood in your poop (stool). This may cause poop to look black and tarry. No pee in 6-8 hours. Only a small amount of pee in 6-8 hours, and the pee is very dark. You have trouble breathing. These symptoms may be an emergency. Get help right away. Call 911. Do not wait to see if the symptoms will go away. Do not drive yourself to the hospital. This information is not intended to replace advice given to you by your health care provider. Make sure you discuss any questions you have with your health care provider. Document Revised: 11/25/2021 Document Reviewed: 11/25/2021 Elsevier Patient Education  2024 [employer]  

## 2022-12-03 ENCOUNTER — Ambulatory Visit: Payer: BC Managed Care – PPO

## 2022-12-03 ENCOUNTER — Other Ambulatory Visit: Payer: Self-pay

## 2022-12-03 ENCOUNTER — Inpatient Hospital Stay: Payer: BC Managed Care – PPO

## 2022-12-03 VITALS — BP 124/79 | HR 63 | Temp 98.1°F | Resp 17

## 2022-12-03 DIAGNOSIS — R634 Abnormal weight loss: Secondary | ICD-10-CM

## 2022-12-03 DIAGNOSIS — E43 Unspecified severe protein-calorie malnutrition: Secondary | ICD-10-CM | POA: Diagnosis not present

## 2022-12-03 DIAGNOSIS — C099 Malignant neoplasm of tonsil, unspecified: Secondary | ICD-10-CM

## 2022-12-03 DIAGNOSIS — R1312 Dysphagia, oropharyngeal phase: Secondary | ICD-10-CM

## 2022-12-03 DIAGNOSIS — Z85810 Personal history of malignant neoplasm of tongue: Secondary | ICD-10-CM | POA: Diagnosis not present

## 2022-12-03 DIAGNOSIS — R471 Dysarthria and anarthria: Secondary | ICD-10-CM | POA: Diagnosis not present

## 2022-12-03 MED ORDER — SODIUM CHLORIDE 0.9 % IV SOLN: Freq: Once | INTRAVENOUS | Status: AC

## 2022-12-03 NOTE — Patient Instructions (Signed)

## 2022-12-03 NOTE — Therapy (Unsigned)
OUTPATIENT SPEECH LANGUAGE PATHOLOGY SWALLOW TREATMENT   Patient Name: Todd [name] MRN: [id number] DOB:Aug 07, 1960, 62 y.o., male Today's Date: 12/04/2022  PCP: Hiram Comber, MD REFERRING PROVIDER: Lonie Peak, MD  END OF SESSION:  End of Session - 12/04/22 1729     Visit Number 2    Number of Visits 17    Date for SLP Re-Evaluation 01/30/23    SLP Start Time 0850    SLP Stop Time  0930    SLP Time Calculation (min) 40 min    Activity Tolerance Patient tolerated treatment well              Past Medical History:  Diagnosis Date   Acquired hypothyroidism    secondary to radiation //   followed by pcp   Arthritis    shoulders, neck, knees, feet   Bilateral sensorineural hearing loss    due to radiation   Decreased range of motion of neck    due to fibrotic changes of 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   Denervation atrophy of muscle    tongue ,  chronic ,  due to radiation injury   (neurologist-- dr aKate Sable ([facility] [place])  EMG in care everywhere done 03-11-2022   Frequency of urination    GERD (gastroesophageal reflux disease)    History of cancer chemotherapy 2017   tonsil cancer   03-24-2016  to 05-06-2016   History of external beam radiation therapy    03-24-2016  to 05-15-2016  Left Tonsil and Bilateral Neck  @ [facility]   Hoarseness, chronic    due to radiation   Hyperlipidemia, mixed    Hypertension    Inguinal hernia, left    Lower facial weakness    intermittant left lower facial weakness   Numbness and tingling in left arm    intermittently per pt   Oropharyngeal dysphagia    hx neck radiation for left tonsil cancer ;  Speech therapy, regular diet with precautions swallows okay   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   followed by ENT dr bates/  oncologist--- dr Bertis Ruddy;  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;  completed chemo 05-06-2016 and completed radiation of left tonsil/ bilateral neck 05-15-2016  w/ radiation injury to tongue  (denervation chronic)   Wears glasses    Wears hearing aid in both ears    Past Surgical History:  Procedure Laterality Date   INGUINAL HERNIA REPAIR Left 05/02/2022   Procedure: OPEN LEFT INGUINAL HERNIA REPAIR WITH MESH;  Surgeon: Berna Bue, MD;  Location: [facility] [place];  Service: General;  Laterality: Left;   IR GASTROSTOMY TUBE MOD SED  11/07/2022   IR GASTROSTOMY TUBE REMOVAL  01/30/2017   IR GENERIC HISTORICAL  03/20/2016   IR US GUIDE VASC ACCESS RIGHT 03/20/2016 Simonne Come, MD [facility]   IR GENERIC HISTORICAL  03/20/2016   IR FLUORO GUIDE PORT INSERTION RIGHT 03/20/2016 Simonne Come, MD [facility]   IR GENERIC HISTORICAL  03/20/2016   IR GASTROSTOMY TUBE MOD SED 03/20/2016 Simonne Come, MD [facility]   IR REMOVAL TUN ACCESS W/ PORT W/O FL MOD SED  01/30/2017   Patient Active Problem List   Diagnosis Date Noted   Pulmonary infiltrates 09/08/2022   Hypothyroidism    Right hip pain 12/06/2018   CKD (chronic kidney disease), stage III (HCC) 11/30/2017   Anemia, chronic disease 11/30/2017   Multiple lung nodules on CT 09/08/2016   Pancytopenia, acquired (HCC) 05/26/2016   Deficiency anemia  05/26/2016   Splenic lesion 05/16/2016   Jaw pain 04/08/2016   Essential hypertension 03/18/2016   Weight loss 02/26/2016   Tonsil cancer (HCC) 02/21/2016    ONSET DATE: 2018   REFERRING DIAG: C09.9 (ICD-10-CM) - Tonsil cancer   THERAPY DIAG:  Dysphagia, oropharyngeal phase  Dysarthria and anarthria  Rationale for Evaluation and Treatment: Rehabilitation  SUBJECTIVE:   SUBJECTIVE STATEMENT: "I've been having a bottle of water a day since I was here." Pt accompanied by: significant other  PERTINENT HISTORY: From Dr. Basilio Cairo note May 2024: The patient returns today for follow-up after he approached me with new symptoms at our head and neck annual survivorship celebration.  Mr. Strohmeier presents for follow up of radiation completed 05/15/16 to his Tonsillar  fossa/oropharynx.  Recent PET and MRI below were reviewed at tumor board, NED.  See below. 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  He is also losing weight.  PAIN:  Are you having pain? No  FALLS: Has patient fallen in last 6 months?  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 SLP reviewed note from [facility] at [facility] in 2023/11 but could not view images. 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 Head tilt to the right attempted to aid oral transiting/control with partial effectiveness. Pharyngeal swallow marked by sevfere hypomotility resulting in minimal transiting of barium into esophagus and laryngeal penetration/aspiration. Mulitple dry swallows *pt initiated* is helpful to decrease amount of retention but does not fuly clear it, even after 5-6 swallows with small single liquid bolus. Head of chair reclined to approx 45* with slight neck extension did decrease amount of penetration and aspiration as gravity faciliated retention of barium in posterior pharynx. Even in  posterior position and with use of right tongue for oral transiting, pt penetrated very small bite of masticated cracker after the swallow as pharyngeal retention spilled into open airway. Cued cough effectively cleared cracker into pharynx for pt to swallow with liquids. Clearance into cervical esophagus was significantly impaired with gross retention of liquids as pyriform sinus. Question if he may benefit from intervention for PES opening to mitigate his dysphagia - advised he speak to Dr Jenne Pane, ENT, re: potential.   Unfortunately Mr [facility] current dysphagia is profound and given progressive deficit (presumed from XRT effects, fibrosis) for 15 months, prognosis for swallow to return to functional level is guarded at best.    Advised pt consider ordering antichoking device for emergent use.  Recommended diet:PO diet PO Diet Recommendation: Thin liquids (Level 0); Mildly thick liquids (Level 2, nectar thick); Full liquid diet, some soft solids Liquid Administration via: Cup; Straw; Spoon Medication Administration: Other (Comment) (consider suspension). Supervision: Patient able to self-feed Recommended compensations:  Swallowing strategies  : effortful swallow; Multiple dry swallows after each bite/sip; Hard cough after swallowing; Head tilt right during swallowing; Slow rate; Small bites/sips (Recline approx 45* with slight chin elevation), Follow solids with liquids Postural changes: Partially reclined for meals Oral care recommendations: Oral care QID (4x/day) Recommended consults: Consider ENT consultation (? ENT to determine if potential PES treatment may be helpful)  COGNITION: Overall cognitive status: Within functional limits for tasks assessed  ORAL MOTOR EXAMINATION: Overall status: Impaired: Labial: Left (Symmetry) Lingual: Right (ROM, Symmetry, and Coordination) Velum: ROM Comments: Mild labial ROM decr'd on lt; Could not assess lingual strength due to decr'd ROM/lateralization,  but suspect ROM is decr'd due to pt's level of dysarthria. 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  CLINICAL SWALLOW ASSESSMENT:   Current diet: thin liquids Dentition: adequate natural dentition Patient directly observed with POs: No due to no oral care prior to ST eval Feeding: able to feed self Liquids provided by: cup with POs  PATIENT REPORTED OUTCOME MEASURES (PROM): EAT-10: to be provided in first 2 sessions   TODAY'S TREATMENT:                                                                                                                                         DATE:  12/03/22: Pt has been performing HEP regularly. Dora Sims he performed exercises with initial mod cue for [place] (tongue protrusion).  He drank water without overt s/sx aspiration x3 using safe swallow strategies independently.  11/24/22:  Today SLP explained results of pt's MBS and the rationale for the safe swallow strategies. Pt demonstrated understanding of each and reports he has been using these with one bottle of water each day. No overt s/sx of aspiration PNA to date with >1 week of PO water. Pt has NOT been performing oral care prior to POs. SLP educated pt re: swallow HEP procedure. Pt performed each exercise and SLP ensured correctness prior to moving to next exercise. Pt req'd min A with strength of cough with supraglottic.  PATIENT EDUCATION: Education details: see "today's treatment" Person educated: Patient and Spouse Education method: Explanation, Demonstration, Verbal cues, and Handouts Education comprehension: verbalized understanding, returned demonstration, verbal cues required, and needs further education   ASSESSMENT:  CLINICAL IMPRESSION: Patient is a 62 y.o. M who was seen today for treatment of swallowing. SLP reviewed pt's swallowing HEP and ensured pt is safe and correct with safe swallow strategies. If progress is seen with HEP after 6 weeks- 8 weeks,  he shold be referred for follow-up MBS to monitor progress. It should be noted that pt's prognosis after MBS last month for improvement to a functional swallow was GUARDED. Todd [name] cont amenable to complete swallow HEP and with improvement, agrees to complete another MBS at a clinically appropriate time.  At this time pt's speech is less concerning however  pt may benefit from a ST course for mastering speech compensations to improve speech intelligibility.  OBJECTIVE IMPAIRMENTS: include dysarthria and dysphagia. These impairments are limiting patient from ADLs/IADLs, effectively communicating at home and in community, and safety when swallowing. Factors affecting potential to achieve goals and functional outcome are co-morbidities and severity of impairments. Patient will benefit from skilled SLP services to address above impairments and improve overall function.  REHAB POTENTIAL: Fair given prognosis from MBS   GOALS: Goals reviewed with patient? Yes  SHORT TERM GOALS: Target date: 12/25/22  Pt will perform HEP with rare min A in two sessions Baseline:12/03/22 Goal status: INITIAL  2.  Pt will follow prescribed safe swallow strategies in 3 sessions Baseline: 12/03/22 Goal status: INITIAL  3.  Pt will perform HEP 6/7 days a week for two weeks, per HEP monitoring method Baseline:  Goal status: INITIAL  4.  Pt and/or wife will ID 3 overt s/sx aspiration PNA with modified independence Baseline:  Goal status: INITIAL   LONG TERM GOALS: Target date: 01/30/23  Pt will perform HEP with rare min A in two sessions Baseline:  Goal status: INITIAL  2.  Pt will follow prescribed safe swallow strategies in 3 sessions Baseline:  Goal status: INITIAL  3.  Pt will undergo follow up MBS when/if clinically indicated Baseline:  Goal status: INITIAL  4.  Pt will score higher on PROM than initial score Baseline:  Goal status: INITIAL   PLAN:  SLP FREQUENCY: 1-2x/week  SLP DURATION: 8  weeks  PLANNED INTERVENTIONS: Aspiration precaution training, Pharyngeal strengthening exercises, Diet toleration management , Environmental controls, Trials of upgraded texture/liquids, Cueing hierachy, Internal/external aids, SLP instruction and feedback, Compensatory strategies, Patient/family education, and Repeat MBS PRN    Adar Rase, CCC-SLP 12/04/2022, 5:30 PM

## 2022-12-04 DIAGNOSIS — M5412 Radiculopathy, cervical region: Secondary | ICD-10-CM | POA: Diagnosis not present

## 2022-12-05 ENCOUNTER — Ambulatory Visit: Payer: BC Managed Care – PPO

## 2022-12-05 DIAGNOSIS — R471 Dysarthria and anarthria: Secondary | ICD-10-CM | POA: Diagnosis not present

## 2022-12-05 DIAGNOSIS — C099 Malignant neoplasm of tonsil, unspecified: Secondary | ICD-10-CM | POA: Diagnosis not present

## 2022-12-05 DIAGNOSIS — R1312 Dysphagia, oropharyngeal phase: Secondary | ICD-10-CM

## 2022-12-05 NOTE — Patient Instructions (Signed)
   Signs of Aspiration Pneumonia   Chest pain/tightness Fever (can be low grade) Cough  With foul-smelling phlegm (sputum) With sputum containing pus or blood With greenish sputum Fatigue  Shortness of breath  Wheezing   **IF YOU HAVE THESE SIGNS, CONTACT YOUR DOCTOR OR GO TO THE EMERGENCY DEPARTMENT OR URGENT CARE AS SOON AS POSSIBLE**     

## 2022-12-05 NOTE — Therapy (Addendum)
OUTPATIENT SPEECH LANGUAGE PATHOLOGY SWALLOW TREATMENT   Patient Name: Todd [name] MRN: [id number] DOB:1960-06-02, 62 y.o., male Today's Date: 12/05/2022  PCP: Hiram Comber, MD REFERRING PROVIDER: Lonie Peak, MD  END OF SESSION:  End of Session - 12/05/22 1104     Visit Number 3    Number of Visits 17    Date for SLP Re-Evaluation 01/30/23    SLP Start Time 1023    SLP Stop Time  1053    SLP Time Calculation (min) 30 min    Activity Tolerance Patient tolerated treatment well               Past Medical History:  Diagnosis Date   Acquired hypothyroidism    secondary to radiation //   followed by pcp   Arthritis    shoulders, neck, knees, feet   Bilateral sensorineural hearing loss    due to radiation   Decreased range of motion of neck    due to fibrotic changes of 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   Denervation atrophy of muscle    tongue ,  chronic ,  due to radiation injury   (neurologist-- dr aKate Sable ([facility] [place])  EMG in care everywhere done 03-11-2022   Frequency of urination    GERD (gastroesophageal reflux disease)    History of cancer chemotherapy 2017   tonsil cancer   03-24-2016  to 05-06-2016   History of external beam radiation therapy    03-24-2016  to 05-15-2016  Left Tonsil and Bilateral Neck  @ [facility]   Hoarseness, chronic    due to radiation   Hyperlipidemia, mixed    Hypertension    Inguinal hernia, left    Lower facial weakness    intermittant left lower facial weakness   Numbness and tingling in left arm    intermittently per pt   Oropharyngeal dysphagia    hx neck radiation for left tonsil cancer ;  Speech therapy, regular diet with precautions swallows okay   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   followed by ENT dr bates/  oncologist--- dr Bertis Ruddy;  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;  completed chemo 05-06-2016 and completed radiation of left tonsil/ bilateral neck 05-15-2016  w/ radiation injury to tongue  (denervation chronic)   Wears glasses    Wears hearing aid in both ears    Past Surgical History:  Procedure Laterality Date   INGUINAL HERNIA REPAIR Left 05/02/2022   Procedure: OPEN LEFT INGUINAL HERNIA REPAIR WITH MESH;  Surgeon: Berna Bue, MD;  Location: [facility];  Service: General;  Laterality: Left;   IR GASTROSTOMY TUBE MOD SED  11/07/2022   IR GASTROSTOMY TUBE REMOVAL  01/30/2017   IR GENERIC HISTORICAL  03/20/2016   IR US GUIDE VASC ACCESS RIGHT 03/20/2016 Simonne Come, MD [facility]   IR GENERIC HISTORICAL  03/20/2016   IR FLUORO GUIDE PORT INSERTION RIGHT 03/20/2016 Simonne Come, MD [facility]   IR GENERIC HISTORICAL  03/20/2016   IR GASTROSTOMY TUBE MOD SED 03/20/2016 Simonne Come, MD [facility]   IR REMOVAL TUN ACCESS W/ PORT W/O FL MOD SED  01/30/2017   Patient Active Problem List   Diagnosis Date Noted   Pulmonary infiltrates 09/08/2022   Hypothyroidism    Right hip pain 12/06/2018   CKD (chronic kidney disease), stage III (HCC) 11/30/2017   Anemia, chronic disease 11/30/2017   Multiple lung nodules on CT 09/08/2016   Pancytopenia, acquired (HCC) 05/26/2016   Deficiency  anemia 05/26/2016   Splenic lesion 05/16/2016   Jaw pain 04/08/2016   Essential hypertension 03/18/2016   Weight loss 02/26/2016   Tonsil cancer (HCC) 02/21/2016    ONSET DATE: 2018   REFERRING DIAG: C09.9 (ICD-10-CM) - Tonsil cancer   THERAPY DIAG:  Dysphagia, oropharyngeal phase  Dysarthria and anarthria  Rationale for Evaluation and Treatment: Rehabilitation  SUBJECTIVE:   SUBJECTIVE STATEMENT: "I've been having a bottle of water a day since I was here." Pt accompanied by: significant other  PERTINENT HISTORY: From Dr. Basilio Cairo note May 2024: The patient returns today for follow-up after he approached me with new symptoms at our head and neck annual survivorship celebration.  Mr. Tortora presents for follow up of radiation completed 05/15/16 to his Tonsillar  fossa/oropharynx.  Recent PET and MRI below were reviewed at tumor board, NED.  See below. 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  He is also losing weight.  PAIN:  Are you having pain? No  FALLS: Has patient fallen in last 6 months?  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 SLP reviewed note from [facility] at [facility] in 2023/11 but could not view images. 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 Head tilt to the right attempted to aid oral transiting/control with partial effectiveness. Pharyngeal swallow marked by sevfere hypomotility resulting in minimal transiting of barium into esophagus and laryngeal penetration/aspiration. Mulitple dry swallows *pt initiated* is helpful to decrease amount of retention but does not fuly clear it, even after 5-6 swallows with small single liquid bolus. Head of chair reclined to approx 45* with slight neck extension did decrease amount of penetration and aspiration as gravity faciliated retention of barium in posterior pharynx. Even in  posterior position and with use of right tongue for oral transiting, pt penetrated very small bite of masticated cracker after the swallow as pharyngeal retention spilled into open airway. Cued cough effectively cleared cracker into pharynx for pt to swallow with liquids. Clearance into cervical esophagus was significantly impaired with gross retention of liquids as pyriform sinus. Question if he may benefit from intervention for PES opening to mitigate his dysphagia - advised he speak to Dr Jenne Pane, ENT, re: potential.   Unfortunately [facility] current dysphagia is profound and given progressive deficit (presumed from XRT effects, fibrosis) for 15 months, prognosis for swallow to return to functional level is guarded at best.    Advised pt consider ordering antichoking device for emergent use.  Recommended diet:PO diet PO Diet Recommendation: Thin liquids (Level 0); Mildly thick liquids (Level 2, nectar thick); Full liquid diet, some soft solids Liquid Administration via: Cup; Straw; Spoon Medication Administration: Other (Comment) (consider suspension). Supervision: Patient able to self-feed Recommended compensations:  Swallowing strategies  : effortful swallow; Multiple dry swallows after each bite/sip; Hard cough after swallowing; Head tilt right during swallowing; Slow rate; Small bites/sips (Recline approx 45* with slight chin elevation), Follow solids with liquids Postural changes: Partially reclined for meals Oral care recommendations: Oral care QID (4x/day) Recommended consults: Consider ENT consultation (? ENT to determine if potential PES treatment may be helpful)  COGNITION: Overall cognitive status: Within functional limits for tasks assessed  ORAL MOTOR EXAMINATION: Overall status: Impaired: Labial: Left (Symmetry) Lingual: Right (ROM, Symmetry, and Coordination) Velum: ROM Comments: Mild labial ROM decr'd on lt; Could not assess lingual strength due to decr'd ROM/lateralization,  but suspect ROM is decr'd due to pt's level of dysarthria. 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  CLINICAL SWALLOW ASSESSMENT:   Current diet: thin liquids Dentition: adequate natural dentition Patient directly observed with POs: No due to no oral care prior to ST eval Feeding: able to feed self Liquids provided by: cup with POs  PATIENT REPORTED OUTCOME MEASURES (PROM): EAT-10: Pt returned 12/05/22 and scored 36/40 (lower scores = better QOL)   TODAY'S TREATMENT:                                                                                                                                         DATE:  12/05/22: Pt independent with HEP today, will use mirror for Masako until extent of tongue protrusion habituated. Pt demo'd strategies with sip water today, independently. Pt and SLP agree pt can reduce to once/week. SLP provided s/sx aspiration PNA and pt with mod I.  12/03/22: Pt has been performing HEP regularly. Dora Sims he performed exercises with initial mod cue for Masako (tongue protrusion).  He drank water without overt s/sx aspiration x3 using safe swallow strategies independently.  11/24/22:  Today SLP explained results of pt's MBS and the rationale for the safe swallow strategies. Pt demonstrated understanding of each and reports he has been using these with one bottle of water each day. No overt s/sx of aspiration PNA to date with >1 week of PO water. Pt has NOT been performing oral care prior to POs. SLP educated pt re: swallow HEP procedure. Pt performed each exercise and SLP ensured correctness prior to moving to next exercise. Pt req'd min A with strength of cough with supraglottic.  PATIENT EDUCATION: Education details: see "today's treatment" Person educated: Patient and Spouse Education method: Explanation, Demonstration, Verbal cues, and Handouts Education comprehension: verbalized understanding, returned demonstration, verbal cues  required, and needs further education   ASSESSMENT:  CLINICAL IMPRESSION: Patient is a 62 y.o. M who was seen today for treatment of swallowing. SLP reviewed pt's swallowing HEP and ensured pt is safe and correct with safe swallow strategies. If progress is seen with HEP after 6 weeks- 8 weeks, he shold be referred for follow-up MBS to monitor progress.  It should be noted that pt's prognosis after MBS last month for improvement to a functional swallow was GUARDED. Jillyn Hidden cont amenable to complete swallow HEP and with improvement, agrees to complete another MBS at a clinically appropriate time.  At this time pt's speech is less concerning however pt may benefit from a ST course for mastering speech compensations to improve speech intelligibility.  OBJECTIVE IMPAIRMENTS: include dysarthria and dysphagia. These impairments are limiting patient from ADLs/IADLs, effectively communicating at home and in community, and safety when swallowing. Factors affecting potential to achieve goals and functional outcome are co-morbidities and severity of impairments. Patient will benefit from skilled SLP services to address above impairments and improve overall function.  REHAB POTENTIAL: Fair given prognosis from MBS   GOALS: Goals reviewed with patient? Yes  SHORT TERM GOALS: Target date: 12/25/22  Pt will perform HEP with rare min A in two sessions Baseline:12/03/22 Goal status: Met  2.  Pt will follow prescribed safe swallow strategies in 3 sessions Baseline: 12/03/22 Goal status: INITIAL  3.  Pt will perform HEP 6/7 days a week for two weeks, per HEP monitoring method Baseline:  Goal status: INITIAL  4.  Pt and/or wife will ID 3 overt s/sx aspiration PNA with modified independence Baseline:  Goal status: met   LONG TERM GOALS: Target date: 01/30/23  Pt will perform HEP with rare min A in two sessions Baseline:  Goal status: INITIAL  2.  Pt will follow prescribed safe swallow strategies in 3  sessions Baseline:  Goal status: INITIAL  3.  Pt will undergo follow up MBS when/if clinically indicated Baseline:  Goal status: INITIAL  4.  Pt will score higher on PROM than initial score Baseline:  Goal status: INITIAL   PLAN:  SLP FREQUENCY: 1x/week  SLP DURATION: 8 weeks  PLANNED INTERVENTIONS: Aspiration precaution training, Pharyngeal strengthening exercises, Diet toleration management , Environmental controls, Trials of upgraded texture/liquids, Cueing hierachy, Internal/external aids, SLP instruction and feedback, Compensatory strategies, Patient/family education, and Repeat MBS PRN    Maudie Shingledecker, CCC-SLP 12/05/2022, 11:04 AM

## 2022-12-06 ENCOUNTER — Other Ambulatory Visit: Payer: Self-pay

## 2022-12-06 ENCOUNTER — Inpatient Hospital Stay: Payer: BC Managed Care – PPO

## 2022-12-06 VITALS — BP 107/60 | HR 62 | Temp 97.4°F | Resp 14

## 2022-12-06 DIAGNOSIS — Z85810 Personal history of malignant neoplasm of tongue: Secondary | ICD-10-CM | POA: Diagnosis not present

## 2022-12-06 DIAGNOSIS — R634 Abnormal weight loss: Secondary | ICD-10-CM

## 2022-12-06 DIAGNOSIS — E43 Unspecified severe protein-calorie malnutrition: Secondary | ICD-10-CM | POA: Diagnosis not present

## 2022-12-06 DIAGNOSIS — C099 Malignant neoplasm of tonsil, unspecified: Secondary | ICD-10-CM

## 2022-12-06 MED ORDER — SODIUM CHLORIDE 0.9 % IV SOLN: Freq: Once | INTRAVENOUS | Status: AC

## 2022-12-06 NOTE — Patient Instructions (Signed)

## 2022-12-09 ENCOUNTER — Ambulatory Visit: Payer: BC Managed Care – PPO

## 2022-12-09 DIAGNOSIS — I951 Orthostatic hypotension: Secondary | ICD-10-CM | POA: Diagnosis not present

## 2022-12-09 DIAGNOSIS — R42 Dizziness and giddiness: Secondary | ICD-10-CM | POA: Diagnosis not present

## 2022-12-09 DIAGNOSIS — R55 Syncope and collapse: Secondary | ICD-10-CM | POA: Diagnosis not present

## 2022-12-10 ENCOUNTER — Inpatient Hospital Stay: Payer: BC Managed Care – PPO

## 2022-12-10 ENCOUNTER — Other Ambulatory Visit: Payer: Self-pay

## 2022-12-10 ENCOUNTER — Ambulatory Visit: Payer: BC Managed Care – PPO | Admitting: Physician Assistant

## 2022-12-10 VITALS — BP 150/84 | HR 72 | Resp 16

## 2022-12-10 DIAGNOSIS — C099 Malignant neoplasm of tonsil, unspecified: Secondary | ICD-10-CM

## 2022-12-10 DIAGNOSIS — R634 Abnormal weight loss: Secondary | ICD-10-CM

## 2022-12-10 DIAGNOSIS — E43 Unspecified severe protein-calorie malnutrition: Secondary | ICD-10-CM | POA: Diagnosis not present

## 2022-12-10 DIAGNOSIS — Z85810 Personal history of malignant neoplasm of tongue: Secondary | ICD-10-CM | POA: Diagnosis not present

## 2022-12-10 MED ORDER — SODIUM CHLORIDE 0.9 % IV SOLN: Freq: Once | INTRAVENOUS | Status: AC

## 2022-12-10 NOTE — Patient Instructions (Signed)

## 2022-12-11 DIAGNOSIS — R1313 Dysphagia, pharyngeal phase: Secondary | ICD-10-CM | POA: Diagnosis not present

## 2022-12-11 DIAGNOSIS — R1314 Dysphagia, pharyngoesophageal phase: Secondary | ICD-10-CM | POA: Diagnosis not present

## 2022-12-12 ENCOUNTER — Ambulatory Visit: Payer: BC Managed Care – PPO | Attending: Radiation Oncology

## 2022-12-12 DIAGNOSIS — R1312 Dysphagia, oropharyngeal phase: Secondary | ICD-10-CM | POA: Insufficient documentation

## 2022-12-12 DIAGNOSIS — R471 Dysarthria and anarthria: Secondary | ICD-10-CM | POA: Diagnosis not present

## 2022-12-12 NOTE — Therapy (Addendum)
OUTPATIENT SPEECH LANGUAGE PATHOLOGY SWALLOW TREATMENT   Patient Name: Todd [name] MRN: [id number] DOB:02-03-1961, 62 y.o., male Today's Date: 12/16/2022  PCP: Todd Comber, MD REFERRING PROVIDER: Lonie Peak, MD  END OF SESSION:  End of Session - 12/16/22 1657     Visit Number 4    Number of Visits 17    Date for SLP Re-Evaluation 01/30/23    SLP Start Time 1018    SLP Stop Time  1100    SLP Time Calculation (min) 42 min    Activity Tolerance Patient tolerated treatment well                Past Medical History:  Diagnosis Date   Acquired hypothyroidism    secondary to radiation //   followed by pcp   Arthritis    shoulders, neck, knees, feet   Bilateral sensorineural hearing loss    due to radiation   Decreased range of motion of neck    due to fibrotic changes of 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   Denervation atrophy of muscle    tongue ,  chronic ,  due to radiation injury   (neurologist-- dr aKate Sable ([facility] [place])  EMG in care everywhere done 03-11-2022   Frequency of urination    GERD (gastroesophageal reflux disease)    History of cancer chemotherapy 2017   tonsil cancer   03-24-2016  to 05-06-2016   History of external beam radiation therapy    03-24-2016  to 05-15-2016  Left Tonsil and Bilateral Neck  @ [facility]   Hoarseness, chronic    due to radiation   Hyperlipidemia, mixed    Hypertension    Inguinal hernia, left    Lower facial weakness    intermittant left lower facial weakness   Numbness and tingling in left arm    intermittently per pt   Oropharyngeal dysphagia    hx neck radiation for left tonsil cancer ;  Speech therapy, regular diet with precautions swallows okay   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   followed by ENT dr bates/  oncologist--- dr Bertis Ruddy;  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;  completed chemo 05-06-2016 and completed radiation of left tonsil/ bilateral neck 05-15-2016  w/ radiation injury to tongue  (denervation chronic)   Wears glasses    Wears hearing aid in both ears    Past Surgical History:  Procedure Laterality Date   INGUINAL HERNIA REPAIR Left 05/02/2022   Procedure: OPEN LEFT INGUINAL HERNIA REPAIR WITH MESH;  Surgeon: Berna Bue, MD;  Location: [facility] ;  Service: General;  Laterality: Left;   IR GASTROSTOMY TUBE MOD SED  11/07/2022   IR GASTROSTOMY TUBE REMOVAL  01/30/2017   IR GENERIC HISTORICAL  03/20/2016   IR US GUIDE VASC ACCESS RIGHT 03/20/2016 Simonne Come, MD [facility]   IR GENERIC HISTORICAL  03/20/2016   IR FLUORO GUIDE PORT INSERTION RIGHT 03/20/2016 Simonne Come, MD [facility]   IR GENERIC HISTORICAL  03/20/2016   IR GASTROSTOMY TUBE MOD SED 03/20/2016 Simonne Come, MD [facility]   IR REMOVAL TUN ACCESS W/ PORT W/O FL MOD SED  01/30/2017   Patient Active Problem List   Diagnosis Date Noted   Pulmonary infiltrates 09/08/2022   Hypothyroidism    Right hip pain 12/06/2018   CKD (chronic kidney disease), stage III (HCC) 11/30/2017   Anemia, chronic disease 11/30/2017   Multiple lung nodules on CT 09/08/2016   Pancytopenia, acquired (HCC) 05/26/2016  Deficiency anemia 05/26/2016   Splenic lesion 05/16/2016   Jaw pain 04/08/2016   Essential hypertension 03/18/2016   Weight loss 02/26/2016   Tonsil cancer (HCC) 02/21/2016    ONSET DATE: 2018   REFERRING DIAG: C09.9 (ICD-10-CM) - Tonsil cancer   THERAPY DIAG:  Dysphagia, oropharyngeal phase  Dysarthria and anarthria  Rationale for Evaluation and Treatment: Rehabilitation  SUBJECTIVE:   SUBJECTIVE STATEMENT: "I've done one set already this morning." Pt accompanied by: significant other  PERTINENT HISTORY: From Dr. Basilio [name] note May 2024: The patient returns today for follow-up after he approached me with new symptoms at our head and neck annual survivorship celebration.  Mr. [name] presents for follow up of radiation completed 05/15/16 to his Tonsillar fossa/oropharynx.  Recent  PET and MRI below were reviewed at tumor board, NED.  See below. 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  He is also losing weight.  PAIN:  Are you having pain? No  FALLS: Has patient fallen in last 6 months?  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 SLP reviewed note from [facility] in 2023/11 but could not view images. 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 Head tilt to the right attempted to aid oral transiting/control with partial effectiveness. Pharyngeal swallow marked by sevfere hypomotility resulting in minimal transiting of barium into esophagus and laryngeal penetration/aspiration. Mulitple dry swallows *pt initiated* is helpful to decrease amount of retention but does not fuly clear it, even after 5-6 swallows with small single liquid bolus. Head of chair reclined to approx 45* with slight neck extension did decrease amount of penetration and aspiration as gravity faciliated retention of barium in posterior pharynx. Even in posterior position and with use of right tongue for oral transiting, pt penetrated very small bite of masticated cracker after the swallow as pharyngeal retention spilled into open airway. Cued cough effectively cleared  cracker into pharynx for pt to swallow with liquids. Clearance into cervical esophagus was significantly impaired with gross retention of liquids as pyriform sinus. Question if he may benefit from intervention for PES opening to mitigate his dysphagia - advised he speak to Dr Jenne Pane, ENT, re: potential.   Unfortunately Mr [facility] current dysphagia is profound and given progressive deficit (presumed from XRT effects, fibrosis) for 15 months, prognosis for swallow to return to functional level is guarded at best.    Advised pt consider ordering antichoking device for emergent use.  Recommended diet:PO diet PO Diet Recommendation: Thin liquids (Level 0); Mildly thick liquids (Level 2, nectar thick); Full liquid diet, some soft solids Liquid Administration via: Cup; Straw; Spoon Medication Administration: Other (Comment) (consider suspension). Supervision: Patient able to self-feed Recommended compensations:  Swallowing strategies  : effortful swallow; Multiple dry swallows after each bite/sip; Hard cough after swallowing; Head tilt right during swallowing; Slow rate; Small bites/sips (Recline approx 45* with slight chin  elevation), Follow solids with liquids Postural changes: Partially reclined for meals Oral care recommendations: Oral care QID (4x/day) Recommended consults: Consider ENT consultation (? ENT to determine if potential PES treatment may be helpful)   PATIENT REPORTED OUTCOME MEASURES (PROM): EAT-10: Pt returned 12/05/22 and scored 36/40 (lower scores = better QOL)   TODAY'S TREATMENT:                                                                                                                                         DATE:  12/12/22: Pt performed oral care prior to ST. Long discussion about whether or not BaSwallow exam would be safe for pt at this time. Afterwards, SLP emailed SLP who performed MBS with pt and she stated BaSwallow exam might be more difficult to perform safely at this  time. Pt performed HEP independently and used safe swallow techniques with sips of water.   12/05/22: Pt independent with HEP today, will use mirror for Masako until extent of tongue protrusion habituated. Pt demo'd strategies with sip water today, independently. Pt and SLP agree pt can reduce to once/week. SLP provided s/sx aspiration PNA and pt with mod I.  12/03/22: Pt has been performing HEP regularly. Dora Sims he performed exercises with initial mod cue for Masako (tongue protrusion).  He drank water without overt s/sx aspiration x3 using safe swallow strategies independently.  11/24/22:  Today SLP explained results of pt's MBS and the rationale for the safe swallow strategies. Pt demonstrated understanding of each and reports he has been using these with one bottle of water each day. No overt s/sx of aspiration PNA to date with >1 week of PO water. Pt has NOT been performing oral care prior to POs. SLP educated pt re: swallow HEP procedure. Pt performed each exercise and SLP ensured correctness prior to moving to next exercise. Pt req'd min A with strength of cough with supraglottic.  PATIENT EDUCATION: Education details: see "today's treatment" Person educated: Patient and Spouse Education method: Explanation, Demonstration, Verbal cues, and Handouts Education comprehension: verbalized understanding, returned demonstration, verbal cues required, and needs further education   ASSESSMENT:  CLINICAL IMPRESSION: Patient is a 62 y.o. M who was seen today for treatment of swallowing. SLP reviewed pt's swallowing HEP and ensured pt is safe and correct with safe swallow strategies. If progress is seen with HEP after 6 weeks- 8 weeks, he shold be referred for follow-up MBS to monitor progress. Please see "today's treatmetn" for more details. It should be noted that pt's prognosis after MBS last month for improvement to a functional swallow was GUARDED. Jillyn Hidden cont amenable to complete swallow HEP and with  improvement, agrees to complete another MBS at a clinically appropriate time.  At this time pt's speech is less concerning however pt may benefit from a ST course for mastering speech compensations to improve speech intelligibility.  OBJECTIVE IMPAIRMENTS: include dysarthria and dysphagia. These  impairments are limiting patient from ADLs/IADLs, effectively communicating at home and in community, and safety when swallowing. Factors affecting potential to achieve goals and functional outcome are co-morbidities and severity of impairments. Patient will benefit from skilled SLP services to address above impairments and improve overall function.  REHAB POTENTIAL: Fair given prognosis from MBS   GOALS: Goals reviewed with patient? Yes  SHORT TERM GOALS: Target date: 12/25/22  Pt will perform HEP with rare min A in two sessions Baseline:12/03/22 Goal status: Met  2.  Pt will follow prescribed safe swallow strategies in 3 sessions Baseline: 12/03/22, 12/12/22 Goal status: INITIAL  3.  Pt will perform HEP 6/7 days a week for two weeks, per HEP monitoring method Baseline:  Goal status: MET  4.  Pt and/or wife will ID 3 overt s/sx aspiration PNA with modified independence Baseline:  Goal status: met   LONG TERM GOALS: Target date: 01/30/23  Pt will perform HEP with rare min A in two sessions Baseline:  Goal status: INITIAL  2.  Pt will follow prescribed safe swallow strategies in 3 sessions Baseline:  Goal status: INITIAL  3.  Pt will undergo follow up MBS when/if clinically indicated Baseline:  Goal status: INITIAL  4.  Pt will score higher on PROM than initial score Baseline:  Goal status: INITIAL   PLAN:  SLP FREQUENCY: 1x/week  SLP DURATION: 8 weeks  PLANNED INTERVENTIONS: Aspiration precaution training, Pharyngeal strengthening exercises, Diet toleration management , Environmental controls, Trials of upgraded texture/liquids, Cueing hierachy, Internal/external aids, SLP  instruction and feedback, Compensatory strategies, Patient/family education, and Repeat MBS PRN    Morgane Joerger, CCC-SLP 12/16/2022, 4:57 PM

## 2022-12-13 ENCOUNTER — Inpatient Hospital Stay: Payer: BC Managed Care – PPO | Attending: Hematology and Oncology

## 2022-12-13 VITALS — BP 134/82 | HR 69 | Temp 98.1°F | Resp 18

## 2022-12-13 DIAGNOSIS — C099 Malignant neoplasm of tonsil, unspecified: Secondary | ICD-10-CM

## 2022-12-13 DIAGNOSIS — R634 Abnormal weight loss: Secondary | ICD-10-CM | POA: Insufficient documentation

## 2022-12-13 DIAGNOSIS — Z85810 Personal history of malignant neoplasm of tongue: Secondary | ICD-10-CM | POA: Diagnosis not present

## 2022-12-13 MED ORDER — SODIUM CHLORIDE 0.9 % IV SOLN: Freq: Once | INTRAVENOUS | Status: AC

## 2022-12-13 NOTE — Patient Instructions (Signed)

## 2022-12-16 ENCOUNTER — Encounter: Payer: Self-pay | Admitting: Hematology and Oncology

## 2022-12-17 ENCOUNTER — Other Ambulatory Visit: Payer: Self-pay

## 2022-12-17 ENCOUNTER — Encounter: Payer: Self-pay | Admitting: Hematology and Oncology

## 2022-12-17 ENCOUNTER — Inpatient Hospital Stay: Payer: BC Managed Care – PPO

## 2022-12-17 VITALS — BP 136/84 | HR 65 | Temp 98.1°F | Resp 18 | Wt 142.0 lb

## 2022-12-17 DIAGNOSIS — Z85810 Personal history of malignant neoplasm of tongue: Secondary | ICD-10-CM | POA: Diagnosis not present

## 2022-12-17 DIAGNOSIS — C099 Malignant neoplasm of tonsil, unspecified: Secondary | ICD-10-CM

## 2022-12-17 DIAGNOSIS — R634 Abnormal weight loss: Secondary | ICD-10-CM

## 2022-12-17 MED ORDER — SODIUM CHLORIDE 0.9 % IV SOLN: Freq: Once | INTRAVENOUS | Status: AC

## 2022-12-19 ENCOUNTER — Ambulatory Visit: Payer: BC Managed Care – PPO

## 2022-12-19 DIAGNOSIS — R1312 Dysphagia, oropharyngeal phase: Secondary | ICD-10-CM | POA: Diagnosis not present

## 2022-12-19 DIAGNOSIS — Z5181 Encounter for therapeutic drug level monitoring: Secondary | ICD-10-CM | POA: Diagnosis not present

## 2022-12-19 DIAGNOSIS — R471 Dysarthria and anarthria: Secondary | ICD-10-CM

## 2022-12-19 DIAGNOSIS — M47812 Spondylosis without myelopathy or radiculopathy, cervical region: Secondary | ICD-10-CM | POA: Diagnosis not present

## 2022-12-19 DIAGNOSIS — M5412 Radiculopathy, cervical region: Secondary | ICD-10-CM | POA: Diagnosis not present

## 2022-12-19 DIAGNOSIS — Z79899 Other long term (current) drug therapy: Secondary | ICD-10-CM | POA: Diagnosis not present

## 2022-12-19 NOTE — Therapy (Addendum)
OUTPATIENT SPEECH LANGUAGE PATHOLOGY SWALLOW TREATMENT   Patient Name: Todd [name] MRN: [id number] DOB:1960/10/13, 62 y.o., male Today's Date: 12/19/2022  PCP: Todd [name] REFERRING PROVIDER: Lonie Peak, [name]  END OF [name]:  End of [name] - 12/19/22 1035     Visit Number 5    Number of Visits 17    Date for SLP Re-Evaluation 01/30/23    SLP Start Time 1021    SLP Stop Time  1100    SLP Time Calculation (min) 39 min    Activity Tolerance Patient tolerated treatment well                Past Medical History:  Diagnosis Date   Acquired hypothyroidism    secondary to radiation //   followed by pcp   Arthritis    shoulders, neck, knees, feet   Bilateral sensorineural hearing loss    due to radiation   Decreased range of motion of neck    due to fibrotic changes of 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   Denervation atrophy of muscle    tongue ,  chronic ,  due to radiation injury   (neurologist-- Todd [name] ([facility])  EMG in care everywhere done 03-11-2022   Frequency of urination    GERD (gastroesophageal reflux disease)    History of cancer chemotherapy 2017   tonsil cancer   03-24-2016  to 05-06-2016   History of external beam radiation therapy    03-24-2016  to 05-15-2016  Left Tonsil and Bilateral Neck  @ [facility]   Hoarseness, chronic    due to radiation   Hyperlipidemia, mixed    Hypertension    Inguinal hernia, left    Lower facial weakness    intermittant left lower facial weakness   Numbness and tingling in left arm    intermittently per pt   Oropharyngeal dysphagia    hx neck radiation for left tonsil cancer ;  Speech therapy, regular diet with precautions swallows okay   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   followed by ENT Todd [name];  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;  completed chemo 05-06-2016 and completed radiation of left tonsil/ bilateral neck 05-15-2016  w/ radiation injury to tongue  (denervation chronic)   Wears glasses    Wears hearing aid in both ears    Past Surgical History:  Procedure Laterality Date   INGUINAL HERNIA REPAIR Left 05/02/2022   Procedure: OPEN LEFT INGUINAL HERNIA REPAIR WITH MESH;  Surgeon: Berna Bue, [name];  Location: [facility];  Service: General;  Laterality: Left;   IR GASTROSTOMY TUBE MOD SED  11/07/2022   IR GASTROSTOMY TUBE REMOVAL  01/30/2017   IR GENERIC HISTORICAL  03/20/2016   IR US GUIDE VASC ACCESS RIGHT 03/20/2016 Todd [name] [facility]INTER[facility]   IR GENERIC HISTORICAL  03/20/2016   IR FLUORO GUIDE PORT INSERTION RIGHT 03/20/2016 Todd [name] [facility]   IR GENERIC HISTORICAL  03/20/2016   IR GASTROSTOMY TUBE MOD SED 03/20/2016 Todd [name] [facility]   IR REMOVAL TUN ACCESS W/ PORT W/O FL MOD SED  01/30/2017   Patient Active Problem List   Diagnosis Date Noted   Pulmonary infiltrates 09/08/2022   Hypothyroidism    Right hip pain 12/06/2018   CKD (chronic kidney disease), stage III (HCC) 11/30/2017   Anemia, chronic disease 11/30/2017   Multiple lung nodules on CT 09/08/2016   Pancytopenia, acquired (HCC) 05/26/2016  Deficiency anemia 05/26/2016   Splenic lesion 05/16/2016   Jaw pain 04/08/2016   Essential hypertension 03/18/2016   Weight loss 02/26/2016   Tonsil cancer (HCC) 02/21/2016    ONSET DATE: 2018   REFERRING DIAG: C09.9 (ICD-10-CM) - Tonsil cancer   THERAPY DIAG:  Dysphagia, oropharyngeal phase  Dysarthria and anarthria  Rationale for Evaluation and Treatment: Rehabilitation  SUBJECTIVE:   SUBJECTIVE STATEMENT: "I got two hours sleep last night." (Due to cervical pain - ran out of Tramadol and sees pain [name] this afternoon) Pt accompanied by: significant other  PERTINENT HISTORY: From Todd. Basilio [name] note May 2024: The patient returns today for follow-up after he approached me with new symptoms at our head and neck annual survivorship celebration.  Todd [name] presents for follow up of  radiation completed 05/15/16 to his Tonsillar fossa/oropharynx.  Recent PET and MRI below were reviewed at tumor board, NED.  See below. 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  He is also losing weight.  PAIN:  Are you having pain? Yes: NPRS scale: 4/10 Pain location: cervical neck down to mid-back Pain description: annoying pain, makes it hard to sleep Aggravating factors: certain movement Relieving factors: meds  FALLS: Has patient fallen in last 6 months?  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 SLP reviewed note from [facility] in 2023/11 but could not view images. 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 Head tilt to the right attempted to aid oral transiting/control with partial effectiveness. Pharyngeal swallow marked by sevfere hypomotility resulting in minimal transiting of barium into esophagus and laryngeal penetration/aspiration. Mulitple dry swallows *pt initiated* is helpful to decrease amount of retention but does not fuly clear it, even after 5-6 swallows with small single liquid bolus. Head of chair reclined to approx 45* with slight neck extension did decrease amount of penetration and aspiration as gravity faciliated retention of  barium in posterior pharynx. Even in posterior position and with use of right tongue for oral transiting, pt penetrated very small bite of masticated cracker after the swallow as pharyngeal retention spilled into open airway. Cued cough effectively cleared cracker into pharynx for pt to swallow with liquids. Clearance into cervical esophagus was significantly impaired with gross retention of liquids as pyriform sinus. Question if he may benefit from intervention for PES opening to mitigate his dysphagia - advised he speak to Todd Jenne Pane, ENT, re: potential.   Unfortunately Mr [facility] current dysphagia is profound and given progressive deficit (presumed from XRT effects, fibrosis) for 15 months, prognosis for swallow to return to functional level is guarded at best.    Advised pt consider ordering antichoking device for emergent use.  Recommended diet:PO diet PO Diet Recommendation: Thin liquids (Level 0); Mildly thick liquids (Level 2, nectar thick); Full liquid diet, some soft solids Liquid Administration via: Cup; Straw; Spoon Medication Administration: Other (Comment) (  consider suspension). Supervision: Patient able to self-feed Recommended compensations:  Swallowing strategies  : effortful swallow; Multiple dry swallows after each bite/sip; Hard cough after swallowing; Head tilt right during swallowing; Slow rate; Small bites/sips (Recline approx 45* with slight chin elevation), Follow solids with liquids Postural changes: Partially reclined for meals Oral care recommendations: Oral care QID (4x/day) Recommended consults: Consider ENT consultation (? ENT to determine if potential PES treatment may be helpful)   PATIENT REPORTED OUTCOME MEASURES (PROM): EAT-10: Pt returned 12/05/22 and scored 36/40 (lower scores = better QOL)   TODAY'S TREATMENT:                                                                                                                                         DATE:  12/19/22:  No s/sx any aspiration PNA today, nor in this last week at all. Performed oral care just prior to appointment today. Pt independent with HEP today. Protruded tongue past lips independently. Pt cont to take one bottle of water per day, multiple (x2-3) sips per bolus with multiple hard swallows and cough if necessary. Instead, SLP told pt to take one sip with two-three hard swallows, cough, reswallow hard. SLP to attempt smooth purees with pt next [name] -will have been 4 weeks since initiation of HEP and pt without any overt s/sx aspiration PNA at this time.  12/12/22: Pt performed oral care prior to ST. Long discussion about whether or not BaSwallow exam would be safe for pt at this time. Afterwards, SLP emailed SLP who performed MBS with pt and she stated BaSwallow exam might be more difficult to perform safely at this time. Pt performed HEP independently and used safe swallow techniques with sips of water.   12/05/22: Pt independent with HEP today, will use mirror for Masako until extent of tongue protrusion habituated. Pt demo'd strategies with sip water today, independently. Pt and SLP agree pt can reduce to once/week. SLP provided s/sx aspiration PNA and pt with mod I.  12/03/22: Pt has been performing HEP regularly. Dora Sims he performed exercises with initial mod cue for Masako (tongue protrusion).  He drank water without overt s/sx aspiration x3 using safe swallow strategies independently.  11/24/22:  Today SLP explained results of pt's MBS and the rationale for the safe swallow strategies. Pt demonstrated understanding of each and reports he has been using these with one bottle of water each day. No overt s/sx of aspiration PNA to date with >1 week of PO water. Pt has NOT been performing oral care prior to POs. SLP educated pt re: swallow HEP procedure. Pt performed each exercise and SLP ensured correctness prior to moving to next exercise. Pt req'd min A with strength of cough with  supraglottic.  PATIENT EDUCATION: Education details: see "today's treatment" Person educated: Patient and Spouse Education method: Explanation, Demonstration, Verbal cues, and Handouts Education comprehension: verbalized understanding, returned demonstration, verbal cues  required, and needs further education   ASSESSMENT:  CLINICAL IMPRESSION: Modified LTG #2 to add date requirement. Patient is a 62 y.o. M who was seen today for treatment of swallowing. SLP reviewed pt's swallowing HEP and ensured pt is safe and correct with safe swallow strategies. If progress is seen with HEP after 6 weeks- 8 weeks, he shold be referred for follow-up MBS to monitor progress. Please see "today's treatmetn" for more details. It should be noted that pt's prognosis after MBS last month for improvement to a functional swallow was GUARDED. Jillyn Hidden cont amenable to complete swallow HEP and with improvement, agrees to complete another MBS at a clinically appropriate time.  At this time pt's speech is less concerning however pt may benefit from a ST course for mastering speech compensations to improve speech intelligibility.  OBJECTIVE IMPAIRMENTS: include dysarthria and dysphagia. These impairments are limiting patient from ADLs/IADLs, effectively communicating at home and in community, and safety when swallowing. Factors affecting potential to achieve goals and functional outcome are co-morbidities and severity of impairments. Patient will benefit from skilled SLP services to address above impairments and improve overall function.  REHAB POTENTIAL: Fair given prognosis from MBS   GOALS: Goals reviewed with patient? Yes  SHORT TERM GOALS: Target date: 12/25/22  Pt will perform HEP with rare min A in two sessions Baseline:12/03/22 Goal status: Met  2.  Pt will follow prescribed safe swallow strategies in 3 sessions Baseline: 12/03/22, 12/12/22 Goal status: INITIAL  3.  Pt will perform HEP 6/7 days a week for two  weeks, per HEP monitoring method Baseline:  Goal status: MET  4.  Pt and/or wife will ID 3 overt s/sx aspiration PNA with modified independence Baseline:  Goal status: met   LONG TERM GOALS: Target date: 01/30/23  Pt will perform HEP with rare min A in two sessions Baseline: 12/19/22 Goal status: Met  2.  Pt will follow prescribed safe swallow strategies in 3 sessions, after 12/25/22 Baseline:  Goal status: INITIAL  3.  Pt will undergo follow up MBS when/if clinically indicated Baseline:  Goal status: INITIAL  4.  Pt will score higher on PROM than initial score Baseline:  Goal status: INITIAL   PLAN:  SLP FREQUENCY: 1x/week  SLP DURATION: 8 weeks  PLANNED INTERVENTIONS: Aspiration precaution training, Pharyngeal strengthening exercises, Diet toleration management , Environmental controls, Trials of upgraded texture/liquids, Cueing hierachy, Internal/external aids, SLP instruction and feedback, Compensatory strategies, Patient/family education, and Repeat MBS PRN    Courtny Bennison, CCC-SLP 12/19/2022, 10:50 AM

## 2022-12-20 ENCOUNTER — Inpatient Hospital Stay: Payer: BC Managed Care – PPO

## 2022-12-20 VITALS — BP 133/82 | HR 66 | Resp 18

## 2022-12-20 DIAGNOSIS — C099 Malignant neoplasm of tonsil, unspecified: Secondary | ICD-10-CM

## 2022-12-20 DIAGNOSIS — R634 Abnormal weight loss: Secondary | ICD-10-CM

## 2022-12-20 DIAGNOSIS — Z85810 Personal history of malignant neoplasm of tongue: Secondary | ICD-10-CM | POA: Diagnosis not present

## 2022-12-20 MED ORDER — SODIUM CHLORIDE 0.9 % IV SOLN: Freq: Once | INTRAVENOUS | Status: AC

## 2022-12-20 NOTE — Patient Instructions (Signed)

## 2022-12-22 ENCOUNTER — Other Ambulatory Visit: Payer: Self-pay | Admitting: Otolaryngology

## 2022-12-22 DIAGNOSIS — Z85818 Personal history of malignant neoplasm of other sites of lip, oral cavity, and pharynx: Secondary | ICD-10-CM | POA: Diagnosis not present

## 2022-12-22 DIAGNOSIS — R1310 Dysphagia, unspecified: Secondary | ICD-10-CM | POA: Diagnosis not present

## 2022-12-22 DIAGNOSIS — R1313 Dysphagia, pharyngeal phase: Secondary | ICD-10-CM

## 2022-12-22 DIAGNOSIS — R634 Abnormal weight loss: Secondary | ICD-10-CM | POA: Diagnosis not present

## 2022-12-22 DIAGNOSIS — Y842 Radiological procedure and radiotherapy as the cause of abnormal reaction of the patient, or of later complication, without mention of misadventure at the time of the procedure: Secondary | ICD-10-CM | POA: Diagnosis not present

## 2022-12-23 NOTE — Progress Notes (Signed)
Todd [name] presents for follow up of radiation completed 05/15/16 to his Tonsillar fossa.   Pain issues, if any: Denies any mouth or throat pain Using a feeding tube?: Yes--continues to be main source of nutrition Weight changes, if any:  Wt Readings from Last 3 Encounters:  01/06/23 145 lb (65.8 kg)  12/17/22 142 lb (64.4 kg)  11/07/22 148 lb (67.1 kg)   Swallowing issues, if any: Yes--was advised to only swallow water after MBSS. Wife reports he is doing weekly SLP sessions, and has been approved to try pureed foods Smoking or chewing tobacco? None Using fluoride toothpaste daily? Yes--confirms he sees a Theatre stage manager at least twice a year.  Last ENT visit was on: 12/11/2022 Saw Dr. Christia Reading:  --He has been advised against cervical spine surgery due to the potential risks outweighing the benefits. He recently received an injection in his neck from a pain specialist  --He is currently undergoing weekly speech therapy. Over the past year, he has experienced aspiration pneumonia three times. His wife notes that his condition has improved significantly since their last visit. Currently, he is only allowed to drink water and receives nutrition through a feeding tube. --The patient's difficulty swallowing is possibly due to radiation-induced scarring. A barium swallow test will be ordered to further investigate the cause of his dysphagia. If the barium swallow test does not yield helpful results, a consultation with a gastroenterologist will be considered for potential upper endoscopy. If the gastroenterologist is unable to perform the procedure, the option of blindly passing dilators will be considered. He is currently advised to drink only water to prevent aspiration pneumonia and continues to receive nutrition through a feeding tube.   Other notable issues, if any: Denies any new areas of swelling or lumps/bumps down neck. Wife has questions regarding:  Patient's hands and feet are always  cold, and his BP tends to stay on the low side (improves some after weekly IVF sessions). She would like to know if a cardiology referral is warranted  Who will be able to do the esophageal stretching?   Can Dr. Basilio Cairo speak to tongue restriction related to cranial nerve #12.

## 2022-12-24 ENCOUNTER — Inpatient Hospital Stay: Payer: BC Managed Care – PPO | Admitting: Dietician

## 2022-12-24 ENCOUNTER — Inpatient Hospital Stay: Payer: BC Managed Care – PPO

## 2022-12-24 ENCOUNTER — Other Ambulatory Visit: Payer: Self-pay

## 2022-12-24 VITALS — BP 163/93 | HR 70 | Temp 97.6°F | Resp 16

## 2022-12-24 DIAGNOSIS — C099 Malignant neoplasm of tonsil, unspecified: Secondary | ICD-10-CM

## 2022-12-24 DIAGNOSIS — R634 Abnormal weight loss: Secondary | ICD-10-CM | POA: Diagnosis not present

## 2022-12-24 DIAGNOSIS — Z85810 Personal history of malignant neoplasm of tongue: Secondary | ICD-10-CM | POA: Diagnosis not present

## 2022-12-24 MED ORDER — SODIUM CHLORIDE 0.9 % IV SOLN: Freq: Once | INTRAVENOUS | Status: AC

## 2022-12-24 NOTE — Patient Instructions (Signed)

## 2022-12-24 NOTE — Progress Notes (Signed)
Nutrition Follow-up:  Patient completed concurrent chemoradiation for tonsil cancer. Final radiation 05/15/16 under the care of Dr. Basilio Cairo. Patient now with dysphagia and recurrent aspiration pneumonia.   Met with pt and wife in infusion. Pt reports tolerating 5 cartons Boost VHC via tube (2650 kcal, 110 g). His swallowing function is improving with guidance of SLP (8/9 SLP notes reviewed). Pt is now drinking one bottle of water orally. Pt giving 3 bottles via tube. Despite recent decline in weight, pt lean body mass is slowly improving. Pt endorses increased strength overall. Estimated needs increased today given wt loss.   Wife reports DME ([employer]) continues to send the incorrect syringes. Most recent shipment makes 6 shipments of syringes he is unable to use.   Medications: reviewed   Labs: no new labs   Anthropometrics: Last wt 142 lb on 8/7 decreased 4% (6 lbs) from 148 lb on 6/28 in 6 weeks - insignificant for time frame, however given severe malnutrition this is concerning   New Estimated Energy Needs (based on current 64.4 kg)   Kcals: 8413-2440 (40-45 kcal/kg) Protein: 103-116 (1.6-1.8 g/kg) Fluid: >/= 2.2 L  NUTRITION DIAGNOSIS: Inadequate oral intake continues - addressing with TF   MALNUTRITION DIAGNOSIS: Severe malnutrition continues - addressing with TF    INTERVENTION:  Continue one carton Boost VHC 5x/day Trial of ProSource to meet new estimated needs - each 30 ml serving provides 60 kcal, 15 g protein - sample bottle provided Educated to give 30 ml prosource mixed with 30 ml water via tube daily. Flush tube with 30 ml water This regimen will provide 2710 kcal, 125 g protein (meets >100% of new estimated needs)    MONITORING, EVALUATION, GOAL: weight trends, TF, diet advancement per SLP   NEXT VISIT: Wednesday August 21 during infusion

## 2022-12-26 ENCOUNTER — Ambulatory Visit: Payer: BC Managed Care – PPO

## 2022-12-26 DIAGNOSIS — R1312 Dysphagia, oropharyngeal phase: Secondary | ICD-10-CM | POA: Diagnosis not present

## 2022-12-26 DIAGNOSIS — R471 Dysarthria and anarthria: Secondary | ICD-10-CM | POA: Diagnosis not present

## 2022-12-26 DIAGNOSIS — Z85818 Personal history of malignant neoplasm of other sites of lip, oral cavity, and pharynx: Secondary | ICD-10-CM | POA: Diagnosis not present

## 2022-12-26 DIAGNOSIS — R634 Abnormal weight loss: Secondary | ICD-10-CM | POA: Diagnosis not present

## 2022-12-26 DIAGNOSIS — Y842 Radiological procedure and radiotherapy as the cause of abnormal reaction of the patient, or of later complication, without mention of misadventure at the time of the procedure: Secondary | ICD-10-CM | POA: Diagnosis not present

## 2022-12-26 DIAGNOSIS — R1310 Dysphagia, unspecified: Secondary | ICD-10-CM | POA: Diagnosis not present

## 2022-12-26 NOTE — Therapy (Signed)
OUTPATIENT SPEECH LANGUAGE PATHOLOGY SWALLOW TREATMENT   Patient Name: Todd [name] MRN: [id number] DOB:[birth year], 62 y.o., male Today's Date: 12/26/2022  PCP: Hiram Comber, MD REFERRING PROVIDER: Lonie Peak, MD  END OF SESSION:  End of Session - 12/26/22 1108     Visit Number 6    Number of Visits 17    Date for SLP Re-Evaluation 01/30/23    SLP Start Time 1104    SLP Stop Time  1145    SLP Time Calculation (min) 41 min    Activity Tolerance Patient tolerated treatment well                Past Medical History:  Diagnosis Date   Acquired hypothyroidism    secondary to radiation //   followed by pcp   Arthritis    shoulders, neck, knees, feet   Bilateral sensorineural hearing loss    due to radiation   Decreased range of motion of neck    due to fibrotic changes of 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   Denervation atrophy of muscle    tongue ,  chronic ,  due to radiation injury   (neurologist-- dr aKate Sable ([facility])  EMG in care everywhere done 03-11-2022   Frequency of urination    GERD (gastroesophageal reflux disease)    History of cancer chemotherapy 2017   tonsil cancer   03-24-2016  to 05-06-2016   History of external beam radiation therapy    03-24-2016  to 05-15-2016  Left Tonsil and Bilateral Neck  @ [facility]   Hoarseness, chronic    due to radiation   Hyperlipidemia, mixed    Hypertension    Inguinal hernia, left    Lower facial weakness    intermittant left lower facial weakness   Numbness and tingling in left arm    intermittently per pt   Oropharyngeal dysphagia    hx neck radiation for left tonsil cancer ;  Speech therapy, regular diet with precautions swallows okay   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   followed by ENT dr bates/  oncologist--- dr Bertis Ruddy;  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;  completed chemo 05-06-2016 and completed radiation of left tonsil/ bilateral neck 05-15-2016  w/ radiation injury to tongue  (denervation chronic)   Wears glasses    Wears hearing aid in both ears    Past Surgical History:  Procedure Laterality Date   INGUINAL HERNIA REPAIR Left 05/02/2022   Procedure: OPEN LEFT INGUINAL HERNIA REPAIR WITH MESH;  Surgeon: Berna Bue, MD;  Location: [facility];  Service: General;  Laterality: Left;   IR GASTROSTOMY TUBE MOD SED  11/07/2022   IR GASTROSTOMY TUBE REMOVAL  01/30/2017   IR GENERIC HISTORICAL  03/20/2016   IR US GUIDE VASC ACCESS RIGHT 03/20/2016 Simonne Come, MD [facility]   IR GENERIC HISTORICAL  03/20/2016   IR FLUORO GUIDE PORT INSERTION RIGHT 03/20/2016 Simonne Come, MD [facility]   IR GENERIC HISTORICAL  03/20/2016   IR GASTROSTOMY TUBE MOD SED 03/20/2016 Simonne Come, MD [facility]   IR REMOVAL TUN ACCESS W/ PORT W/O FL MOD SED  01/30/2017   Patient Active Problem List   Diagnosis Date Noted   Pulmonary infiltrates 09/08/2022   Hypothyroidism    Right hip pain 12/06/2018   CKD (chronic kidney disease), stage III (HCC) 11/30/2017   Anemia, chronic disease 11/30/2017   Multiple lung nodules on CT 09/08/2016   Pancytopenia, acquired (HCC) 05/26/2016  Deficiency anemia 05/26/2016   Splenic lesion 05/16/2016   Jaw pain 04/08/2016   Essential hypertension 03/18/2016   Weight loss 02/26/2016   Tonsil cancer (HCC) 02/21/2016    ONSET DATE: 2018   REFERRING DIAG: C09.9 (ICD-10-CM) - Tonsil cancer   THERAPY DIAG:  Dysphagia, oropharyngeal phase  Dysarthria and anarthria  Rationale for Evaluation and Treatment: Rehabilitation  SUBJECTIVE:   SUBJECTIVE STATEMENT: Pt brought in baby food applesauce today. Pt accompanied by: significant other  PERTINENT HISTORY: From Dr. Basilio Cairo note May 2024: The patient returns today for follow-up after he approached me with new symptoms at our head and neck annual survivorship celebration.  Mr. [name] presents for follow up of radiation completed 05/15/16 to his Tonsillar fossa/oropharynx.  Recent  PET and MRI below were reviewed at tumor board, NED.  See below. 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  He is also losing weight.  PAIN:  Are you having pain? Yes: NPRS scale: 4/10 Pain location: cervical neck down to mid-back Pain description: annoying pain, makes it hard to sleep Aggravating factors: certain movement Relieving factors: meds  FALLS: Has patient fallen in last 6 months?  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 SLP reviewed note from [facility] in 2023/11 but could not view images. 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 Head tilt to the right attempted to aid oral transiting/control with partial effectiveness. Pharyngeal swallow marked by sevfere hypomotility resulting in minimal transiting of barium into esophagus and laryngeal penetration/aspiration. Mulitple dry swallows *pt initiated* is helpful to decrease amount of retention but does not fuly clear it, even after 5-6 swallows with small single liquid bolus. Head of chair reclined to approx 45* with slight neck extension did decrease amount of penetration and aspiration as gravity faciliated retention of barium in posterior pharynx. Even in posterior position and with use of  right tongue for oral transiting, pt penetrated very small bite of masticated cracker after the swallow as pharyngeal retention spilled into open airway. Cued cough effectively cleared cracker into pharynx for pt to swallow with liquids. Clearance into cervical esophagus was significantly impaired with gross retention of liquids as pyriform sinus. Question if he may benefit from intervention for PES opening to mitigate his dysphagia - advised he speak to Dr Jenne Pane, ENT, re: potential.   Unfortunately [facility] current dysphagia is profound and given progressive deficit (presumed from XRT effects, fibrosis) for 15 months, prognosis for swallow to return to functional level is guarded at best.    Advised pt consider ordering antichoking device for emergent use.  Recommended diet:PO diet PO Diet Recommendation: Thin liquids (Level 0); Mildly thick liquids (Level 2, nectar thick); Full liquid diet, some soft solids Liquid Administration via: Cup; Straw; Spoon Medication Administration: Other (Comment) (consider suspension). Supervision: Patient able to self-feed Recommended compensations:  Swallowing strategies  : effortful  swallow; Multiple dry swallows after each bite/sip; Hard cough after swallowing; Head tilt right during swallowing; Slow rate; Small bites/sips (Recline approx 45* with slight chin elevation), Follow solids with liquids Postural changes: Partially reclined for meals Oral care recommendations: Oral care QID (4x/day) Recommended consults: Consider ENT consultation (? ENT to determine if potential PES treatment may be helpful)   PATIENT REPORTED OUTCOME MEASURES (PROM): EAT-10: Pt returned 12/05/22 and scored 36/40 (lower scores = better QOL)   TODAY'S TREATMENT:                                                                                                                                         DATE:  10/26/22: No s/sx any aspiration PNA today, nor in this last week at all. Pt cont  to perform PO trials with water at home, but coughs with every 3-4th swallow. SLP strongly urged pt to complete cough every swallow based upon MBS results. Pt followed other precautions with independence. SLP trialed baby food applesauce with pt with liquid wash and completed with success. SLP told pt to try 10 boluses as many times/day as desired but always stopping if he feels he has less than 8/10 stamina (10=WNL), and always perform thorough oral care before and after POs. SLP also suggested pt could try baby food applesauce-consistency pudding or yogurt. Pt completed HEP today independently. Due to initiating puree today pt will keep frequency at once/week.   12/19/22: No s/sx any aspiration PNA today, nor in this last week at all. Performed oral care just prior to appointment today. Pt independent with HEP today. Protruded tongue past lips independently. Pt cont to take one bottle of water per day, multiple (x2-3) sips per bolus with multiple hard swallows and cough if necessary. Instead, SLP told pt to take one sip with two-three hard swallows, cough, reswallow hard. SLP to attempt smooth purees with pt next session -will have been 4 weeks since initiation of HEP and pt without any overt s/sx aspiration PNA at this time.  12/12/22: Pt performed oral care prior to ST. Long discussion about whether or not BaSwallow exam would be safe for pt at this time. Afterwards, SLP emailed SLP who performed MBS with pt and she stated BaSwallow exam might be more difficult to perform safely at this time. Pt performed HEP independently and used safe swallow techniques with sips of water.   12/05/22: Pt independent with HEP today, will use mirror for Masako until extent of tongue protrusion habituated. Pt demo'd strategies with sip water today, independently. Pt and SLP agree pt can reduce to once/week. SLP provided s/sx aspiration PNA and pt with mod I.  12/03/22: Pt has been performing HEP regularly. Dora Sims he performed  exercises with initial mod cue for Masako (tongue protrusion).  He drank water without overt s/sx aspiration x3 using safe swallow strategies independently.  11/24/22:  Today SLP explained results of  pt's MBS and the rationale for the safe swallow strategies. Pt demonstrated understanding of each and reports he has been using these with one bottle of water each day. No overt s/sx of aspiration PNA to date with >1 week of PO water. Pt has NOT been performing oral care prior to POs. SLP educated pt re: swallow HEP procedure. Pt performed each exercise and SLP ensured correctness prior to moving to next exercise. Pt req'd min A with strength of cough with supraglottic.  PATIENT EDUCATION: Education details: see "today's treatment" Person educated: Patient and Spouse Education method: Explanation, Demonstration, Verbal cues, and Handouts Education comprehension: verbalized understanding, returned demonstration, verbal cues required, and needs further education   ASSESSMENT:  CLINICAL IMPRESSION: Patient is a 62 y.o. M who was seen today for treatment of swallowing. SLP reviewed pt's swallowing HEP and ensured pt is safe and correct with safe swallow strategies. If progress is seen with HEP after 6 weeks- 8 weeks, he shold be referred for follow-up MBS to monitor progress. Please see "today's treatment" for more details. After today's session, pt is trying baby food consistency applesauce at home with liquid wash, following all safe swallow precautions.It should be noted that pt's prognosis after MBS last month for improvement to a functional swallow was GUARDED. Jillyn Hidden cont amenable to complete swallow HEP and with improvement, agrees to complete another MBS at a clinically appropriate time.  At this time pt's speech is less concerning however pt may benefit from a ST course for mastering speech compensations to improve speech intelligibility.  OBJECTIVE IMPAIRMENTS: include dysarthria and dysphagia.  These impairments are limiting patient from ADLs/IADLs, effectively communicating at home and in community, and safety when swallowing. Factors affecting potential to achieve goals and functional outcome are co-morbidities and severity of impairments. Patient will benefit from skilled SLP services to address above impairments and improve overall function.  REHAB POTENTIAL: Fair given prognosis from MBS   GOALS: Goals reviewed with patient? Yes  SHORT TERM GOALS: Target date: 12/25/22  Pt will perform HEP with rare min A in two sessions Baseline:12/03/22 Goal status: Met  2.  Pt will follow prescribed safe swallow strategies in 3 sessions Baseline: 12/03/22, 12/12/22 Goal status: Partially met  3.  Pt will perform HEP 6/7 days a week for two weeks, per HEP monitoring method Baseline:  Goal status: MET  4.  Pt and/or wife will ID 3 overt s/sx aspiration PNA with modified independence Baseline:  Goal status: met   LONG TERM GOALS: Target date: 01/30/23  Pt will perform HEP with rare min A in two sessions Baseline: 12/19/22 Goal status: Met  2.  Pt will follow prescribed safe swallow strategies in 3 sessions, after 12/25/22 Baseline:  Goal status: INITIAL  3.  Pt will undergo follow up MBS when/if clinically indicated Baseline:  Goal status: INITIAL  4.  Pt will score higher on PROM than initial score Baseline:  Goal status: INITIAL   PLAN:  SLP FREQUENCY: every other week  SLP DURATION: 8 weeks  PLANNED INTERVENTIONS: Aspiration precaution training, Pharyngeal strengthening exercises, Diet toleration management , Environmental controls, Trials of upgraded texture/liquids, Cueing hierachy, Internal/external aids, SLP instruction and feedback, Compensatory strategies, Patient/family education, and Repeat MBS PRN    Eli Pattillo, CCC-SLP 12/26/2022, 11:09 AM

## 2022-12-27 ENCOUNTER — Inpatient Hospital Stay: Payer: BC Managed Care – PPO

## 2022-12-27 VITALS — BP 121/76 | HR 63 | Temp 98.5°F | Resp 16

## 2022-12-27 DIAGNOSIS — Z85810 Personal history of malignant neoplasm of tongue: Secondary | ICD-10-CM | POA: Diagnosis not present

## 2022-12-27 DIAGNOSIS — R634 Abnormal weight loss: Secondary | ICD-10-CM

## 2022-12-27 DIAGNOSIS — C099 Malignant neoplasm of tonsil, unspecified: Secondary | ICD-10-CM

## 2022-12-27 MED ORDER — SODIUM CHLORIDE 0.9 % IV SOLN
Freq: Once | INTRAVENOUS | Status: AC
  Administered 2022-12-27: 1000 mL via INTRAVENOUS

## 2022-12-27 NOTE — Patient Instructions (Signed)

## 2022-12-31 ENCOUNTER — Inpatient Hospital Stay: Payer: BC Managed Care – PPO

## 2022-12-31 ENCOUNTER — Encounter: Payer: Self-pay | Admitting: Hematology and Oncology

## 2022-12-31 ENCOUNTER — Inpatient Hospital Stay: Payer: BC Managed Care – PPO | Admitting: Dietician

## 2022-12-31 VITALS — BP 156/87 | HR 65 | Resp 16

## 2022-12-31 DIAGNOSIS — R634 Abnormal weight loss: Secondary | ICD-10-CM

## 2022-12-31 DIAGNOSIS — Z85810 Personal history of malignant neoplasm of tongue: Secondary | ICD-10-CM | POA: Diagnosis not present

## 2022-12-31 DIAGNOSIS — C099 Malignant neoplasm of tonsil, unspecified: Secondary | ICD-10-CM

## 2022-12-31 MED ORDER — SODIUM CHLORIDE 0.9 % IV SOLN: Freq: Once | INTRAVENOUS | Status: AC

## 2022-12-31 NOTE — Progress Notes (Signed)
Nutrition Follow-up:  Patient completed concurrent chemoradiation for tonsil cancer. Final radiation 05/15/16 under the care of Dr. Basilio Cairo. Patient now with dysphagia and recurrent aspiration pneumonia. Pt currently receiving supportive care with IV fluids.    Met with pt and wife in infusion. Pt reports trials of applesauce at last ST appt. This went well. He has been eating bites of applesauce and drinking orange juice at home. Pt is tolerating tube feedings at goal. He has been giving 30 ml ProSouce daily. Patient denies nausea, vomiting, diarrhea, constipation.    Medications: reviewed   Labs: no new labs for review  Anthropometrics: Wife is weighing pt weekly at home. Reports 143.2 lb on 8/20 increased from 142 lb on 8/13  Kcals: 2576-2898 (40-45 kcal/kg) Protein: 103-116 (1.6-1.8 g/kg) Fluid: >/= 2.2 L   NUTRITION DIAGNOSIS: Inadequate oral intake continues - addressing with TF   MALNUTRITION DIAGNOSIS: Severe malnutrition continues - addressing with TF   INTERVENTION:  Continue one carton Boost VHC via tube 5x/day Continue 30 ml ProSource once/day (60 kcal, 15g)    MONITORING, EVALUATION, GOAL: weight trends, TF, diet advancement per SLP   NEXT VISIT: Wednesday August 28 in infusion

## 2022-12-31 NOTE — Patient Instructions (Signed)

## 2023-01-01 ENCOUNTER — Ambulatory Visit: Payer: BC Managed Care – PPO

## 2023-01-01 DIAGNOSIS — R471 Dysarthria and anarthria: Secondary | ICD-10-CM

## 2023-01-01 DIAGNOSIS — R1312 Dysphagia, oropharyngeal phase: Secondary | ICD-10-CM | POA: Diagnosis not present

## 2023-01-01 NOTE — Therapy (Signed)
OUTPATIENT SPEECH LANGUAGE PATHOLOGY SWALLOW TREATMENT   Patient Name: Todd [name] MRN: [id number] DOB:[birth year], 62 y.o., male Today's Date: 01/01/2023  PCP: Hiram Comber, MD REFERRING PROVIDER: Lonie Peak, MD  END OF SESSION:  End of Session - 01/01/23 1028     Visit Number 7    Number of Visits 17    Date for SLP Re-Evaluation 01/30/23    SLP Start Time 1021    SLP Stop Time  1100    SLP Time Calculation (min) 39 min    Activity Tolerance Patient tolerated treatment well                Past Medical History:  Diagnosis Date   Acquired hypothyroidism    secondary to radiation //   followed by pcp   Arthritis    shoulders, neck, knees, feet   Bilateral sensorineural hearing loss    due to radiation   Decreased range of motion of neck    due to fibrotic changes of 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   Denervation atrophy of muscle    tongue ,  chronic ,  due to radiation injury   (neurologist-- dr aKate Sable ([facility])  EMG in care everywhere done 03-11-2022   Frequency of urination    GERD (gastroesophageal reflux disease)    History of cancer chemotherapy 2017   tonsil cancer   03-24-2016  to 05-06-2016   History of external beam radiation therapy    03-24-2016  to 05-15-2016  Left Tonsil and Bilateral Neck  @ [facility]   Hoarseness, chronic    due to radiation   Hyperlipidemia, mixed    Hypertension    Inguinal hernia, left    Lower facial weakness    intermittant left lower facial weakness   Numbness and tingling in left arm    intermittently per pt   Oropharyngeal dysphagia    hx neck radiation for left tonsil cancer ;  Speech therapy, regular diet with precautions swallows okay   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   followed by ENT dr bates/  oncologist--- dr Bertis Ruddy;  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;  completed chemo 05-06-2016 and completed radiation of left tonsil/ bilateral neck 05-15-2016  w/ radiation injury to tongue  (denervation chronic)   Wears glasses    Wears hearing aid in both ears    Past Surgical History:  Procedure Laterality Date   INGUINAL HERNIA REPAIR Left 05/02/2022   Procedure: OPEN LEFT INGUINAL HERNIA REPAIR WITH MESH;  Surgeon: Berna Bue, MD;  Location: [facility];  Service: General;  Laterality: Left;   IR GASTROSTOMY TUBE MOD SED  11/07/2022   IR GASTROSTOMY TUBE REMOVAL  01/30/2017   IR GENERIC HISTORICAL  03/20/2016   IR US GUIDE VASC ACCESS RIGHT 03/20/2016 Simonne Come, MD [facility]   IR GENERIC HISTORICAL  03/20/2016   IR FLUORO GUIDE PORT INSERTION RIGHT 03/20/2016 Simonne Come, MD [facility]   IR GENERIC HISTORICAL  03/20/2016   IR GASTROSTOMY TUBE MOD SED 03/20/2016 Simonne Come, MD [facility]   IR REMOVAL TUN ACCESS W/ PORT W/O FL MOD SED  01/30/2017   Patient Active Problem List   Diagnosis Date Noted   Pulmonary infiltrates 09/08/2022   Hypothyroidism    Right hip pain 12/06/2018   CKD (chronic kidney disease), stage III (HCC) 11/30/2017   Anemia, chronic disease 11/30/2017   Multiple lung nodules on CT 09/08/2016   Pancytopenia, acquired (HCC) 05/26/2016  Deficiency anemia 05/26/2016   Splenic lesion 05/16/2016   Jaw pain 04/08/2016   Essential hypertension 03/18/2016   Weight loss 02/26/2016   Tonsil cancer (HCC) 02/21/2016    ONSET DATE: 2018   REFERRING DIAG: C09.9 (ICD-10-CM) - Tonsil cancer   THERAPY DIAG:  Dysphagia, oropharyngeal phase  Dysarthria and anarthria  Rationale for Evaluation and Treatment: Rehabilitation  SUBJECTIVE:   SUBJECTIVE STATEMENT: No overt s/sx aspiration PNA today. Pt accompanied by: significant other  PERTINENT HISTORY: From Dr. Basilio Cairo note May 2024: The patient returns today for follow-up after he approached me with new symptoms at our head and neck annual survivorship celebration.  Mr. Groeber presents for follow up of radiation completed 05/15/16 to his Tonsillar fossa/oropharynx.  Recent PET  and MRI below were reviewed at tumor board, NED.  See below. 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  He is also losing weight.  PAIN:  Are you having pain? Yes: NPRS scale: 4/10 Pain location: cervical neck down to mid-back Pain description: annoying pain, makes it hard to sleep Aggravating factors: certain movement Relieving factors: meds  FALLS: Has patient fallen in last 6 months?  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 SLP reviewed note from [facility] at [facility] in 2023/11 but could not view images. 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 Head tilt to the right attempted to aid oral transiting/control with partial effectiveness. Pharyngeal swallow marked by sevfere hypomotility resulting in minimal transiting of barium into esophagus and laryngeal penetration/aspiration. Mulitple dry swallows *pt initiated* is helpful to decrease amount of retention but does not fuly clear it, even after 5-6 swallows with small single liquid bolus. Head of chair reclined to approx 45* with slight neck extension did decrease amount of penetration and aspiration as gravity faciliated retention of barium in posterior pharynx. Even in posterior position and with use of right  tongue for oral transiting, pt penetrated very small bite of masticated cracker after the swallow as pharyngeal retention spilled into open airway. Cued cough effectively cleared cracker into pharynx for pt to swallow with liquids. Clearance into cervical esophagus was significantly impaired with gross retention of liquids as pyriform sinus. Question if he may benefit from intervention for PES opening to mitigate his dysphagia - advised he speak to Dr Jenne Pane, ENT, re: potential.   Unfortunately [facility] current dysphagia is profound and given progressive deficit (presumed from XRT effects, fibrosis) for 15 months, prognosis for swallow to return to functional level is guarded at best.    Advised pt consider ordering antichoking device for emergent use.  Recommended diet:PO diet PO Diet Recommendation: Thin liquids (Level 0); Mildly thick liquids (Level 2, nectar thick); Full liquid diet, some soft solids Liquid Administration via: Cup; Straw; Spoon Medication Administration: Other (Comment) (consider suspension). Supervision: Patient able to self-feed Recommended compensations:  Swallowing strategies  : effortful swallow;  Multiple dry swallows after each bite/sip; Hard cough after swallowing; Head tilt right during swallowing; Slow rate; Small bites/sips (Recline approx 45* with slight chin elevation), Follow solids with liquids Postural changes: Partially reclined for meals Oral care recommendations: Oral care QID (4x/day) Recommended consults: Consider ENT consultation (? ENT to determine if potential PES treatment may be helpful)   PATIENT REPORTED OUTCOME MEASURES (PROM): EAT-10: Pt returned 12/05/22 and scored 36/40 (lower scores = better QOL)   TODAY'S TREATMENT:                                                                                                                                         DATE:  01/01/23: Pt reports difficult to get trigger on first 1-3 reps of Masako and  supraglottic but once he passes these number of reps - no difficulty with triggering swallow. No problem at all triggering effortful swallow from rep #1 on. Today pt did not have difficulty with triggering swallows with Masako. Although lingual protrusion was adequate, SLP told pt to protrude tongue a bit further for the first 2 reps and he could do so. SLP told pt protruding tongue to that length was preferable to the length he currently uses, so he should try to have more reps with further protrusion. Last session, SLP added dynamic Shaker to pt's regimen, 30 1-second reps at least BID. With POs, pt made through one 4 oz jar in one week. SLP suggested pt use drinkable yogurt, Fair life milk, or Boost as POs as well, as long as pt is using safe swallow techniques from [facility] 10/30/22. Pt req'd cues for a cough after every bite/sip, and SLP provided rationale and reminder that pt's sensation was not  100% when material was penetrated.  12/26/22: No s/sx any aspiration PNA today, nor in this last week at all. Pt cont to perform PO trials with water at home, but coughs with every 3-4th swallow. SLP strongly urged pt to complete cough every swallow based upon MBS results. Pt followed other precautions with independence. SLP trialed baby food applesauce with pt with liquid wash and completed with success. SLP told pt to try 10 boluses as many times/day as desired but always stopping if he feels he has less than 8/10 stamina (10=WNL), and always perform thorough oral care before and after POs. SLP also suggested pt could try baby food applesauce-consistency pudding or yogurt. Pt completed HEP today independently. Due to initiating puree today pt will keep frequency at once/week.   12/19/22: No s/sx any aspiration PNA today, nor in this last week at all. Performed oral care just prior to appointment today. Pt independent with HEP today. Protruded tongue past lips independently. Pt cont to take one bottle of water per day,  multiple (x2-3) sips per bolus with multiple hard swallows and cough if necessary. Instead, SLP told pt to take one sip with two-three hard swallows,  cough, reswallow hard. SLP to attempt smooth purees with pt next session -will have been 4 weeks since initiation of HEP and pt without any overt s/sx aspiration PNA at this time.  12/12/22: Pt performed oral care prior to ST. Long discussion about whether or not BaSwallow exam would be safe for pt at this time. Afterwards, SLP emailed SLP who performed MBS with pt and she stated BaSwallow exam might be more difficult to perform safely at this time. Pt performed HEP independently and used safe swallow techniques with sips of water.   12/05/22: Pt independent with HEP today, will use mirror for Masako until extent of tongue protrusion habituated. Pt demo'd strategies with sip water today, independently. Pt and SLP agree pt can reduce to once/week. SLP provided s/sx aspiration PNA and pt with mod I.  12/03/22: Pt has been performing HEP regularly. Dora Sims he performed exercises with initial mod cue for Masako (tongue protrusion).  He drank water without overt s/sx aspiration x3 using safe swallow strategies independently.  11/24/22:  Today SLP explained results of pt's MBS and the rationale for the safe swallow strategies. Pt demonstrated understanding of each and reports he has been using these with one bottle of water each day. No overt s/sx of aspiration PNA to date with >1 week of PO water. Pt has NOT been performing oral care prior to POs. SLP educated pt re: swallow HEP procedure. Pt performed each exercise and SLP ensured correctness prior to moving to next exercise. Pt req'd min A with strength of cough with supraglottic.  PATIENT EDUCATION: Education details: see "today's treatment" Person educated: Patient and Spouse Education method: Explanation, Demonstration, Verbal cues, and Handouts Education comprehension: verbalized understanding, returned  demonstration, verbal cues required, and needs further education   ASSESSMENT:  CLINICAL IMPRESSION: Patient is a 62 y.o. M who was seen today for treatment of swallowing. SLP reviewed pt's swallowing HEP and ensured pt is safe and correct with safe swallow strategies. If progress is seen with HEP by the end of September, he shold be referred for follow-up MBS to monitor progress. Please see "today's treatment" for more details. It should be noted that pt's prognosis after June 2024 MBS, improvement for a functional swallow was GUARDED. Todd [name] cont amenable to complete swallow HEP and with improvement, agrees to complete another MBS at a clinically appropriate time.  At this time pt's speech is less concerning however pt may benefit from a ST course for mastering speech compensations to improve speech intelligibility.  OBJECTIVE IMPAIRMENTS: include dysarthria and dysphagia. These impairments are limiting patient from ADLs/IADLs, effectively communicating at home and in community, and safety when swallowing. Factors affecting potential to achieve goals and functional outcome are co-morbidities and severity of impairments. Patient will benefit from skilled SLP services to address above impairments and improve overall function.  REHAB POTENTIAL: Fair given prognosis from MBS   GOALS: Goals reviewed with patient? Yes  SHORT TERM GOALS: Target date: 12/25/22  Pt will perform HEP with rare min A in two sessions Baseline:12/03/22 Goal status: Met  2.  Pt will follow prescribed safe swallow strategies in 3 sessions Baseline: 12/03/22, 12/12/22 Goal status: Partially met  3.  Pt will perform HEP 6/7 days a week for two weeks, per HEP monitoring method Baseline:  Goal status: MET  4.  Pt and/or wife will ID 3 overt s/sx aspiration PNA with modified independence Baseline:  Goal status: met   LONG TERM GOALS: Target date: 01/30/23  Pt will perform HEP with  rare min A in two sessions Baseline:  12/19/22 Goal status: Met  2.  Pt will follow prescribed safe swallow strategies in 3 sessions, after 12/25/22 Baseline: Goal status: INITIAL  3.  Pt will undergo follow up MBS when/if clinically indicated Baseline:  Goal status: INITIAL  4.  Pt will score higher on PROM than initial score Baseline:  Goal status: INITIAL   PLAN:  SLP FREQUENCY: 1x/week  SLP DURATION: 8 weeks  PLANNED INTERVENTIONS: Aspiration precaution training, Pharyngeal strengthening exercises, Diet toleration management , Environmental controls, Trials of upgraded texture/liquids, Cueing hierachy, Internal/external aids, SLP instruction and feedback, Compensatory strategies, Patient/family education, and Repeat MBS PRN    Aylssa Herrig, CCC-SLP 01/01/2023, 10:29 AM

## 2023-01-01 NOTE — Addendum Note (Signed)
Addended by: Verdie Mosher B on: 01/01/2023 12:00 PM   Modules accepted: Orders

## 2023-01-03 ENCOUNTER — Inpatient Hospital Stay: Payer: BC Managed Care – PPO

## 2023-01-03 ENCOUNTER — Other Ambulatory Visit: Payer: Self-pay

## 2023-01-03 VITALS — BP 147/93 | HR 77 | Temp 98.1°F | Resp 16

## 2023-01-03 DIAGNOSIS — C099 Malignant neoplasm of tonsil, unspecified: Secondary | ICD-10-CM

## 2023-01-03 DIAGNOSIS — R634 Abnormal weight loss: Secondary | ICD-10-CM

## 2023-01-03 DIAGNOSIS — Z85810 Personal history of malignant neoplasm of tongue: Secondary | ICD-10-CM | POA: Diagnosis not present

## 2023-01-03 MED ORDER — SODIUM CHLORIDE 0.9 % IV SOLN: Freq: Once | INTRAVENOUS | Status: AC

## 2023-01-03 NOTE — Patient Instructions (Signed)

## 2023-01-05 ENCOUNTER — Telehealth: Payer: Self-pay

## 2023-01-05 NOTE — Telephone Encounter (Signed)
Returned wife's call. She is requesting IV fluids x 1 a week for the month of September for [place].  Sent a scheduling message to schedule IV fluids.  FYI

## 2023-01-06 ENCOUNTER — Ambulatory Visit
Admission: RE | Admit: 2023-01-06 | Discharge: 2023-01-06 | Disposition: A | Discharge status: Home / Self Care | Payer: BC Managed Care – PPO | Source: Ambulatory Visit | Attending: Radiation Oncology | Admitting: Radiation Oncology

## 2023-01-06 VITALS — BP 134/89 | HR 71 | Temp 97.3°F | Resp 20 | Ht 69.0 in | Wt 145.0 lb

## 2023-01-06 DIAGNOSIS — Z7982 Long term (current) use of aspirin: Secondary | ICD-10-CM | POA: Insufficient documentation

## 2023-01-06 DIAGNOSIS — Z85818 Personal history of malignant neoplasm of other sites of lip, oral cavity, and pharynx: Secondary | ICD-10-CM | POA: Diagnosis not present

## 2023-01-06 DIAGNOSIS — C090 Malignant neoplasm of tonsillar fossa: Secondary | ICD-10-CM

## 2023-01-06 DIAGNOSIS — Z85819 Personal history of malignant neoplasm of unspecified site of lip, oral cavity, and pharynx: Secondary | ICD-10-CM | POA: Insufficient documentation

## 2023-01-06 DIAGNOSIS — Z79890 Hormone replacement therapy (postmenopausal): Secondary | ICD-10-CM | POA: Diagnosis not present

## 2023-01-06 DIAGNOSIS — C099 Malignant neoplasm of tonsil, unspecified: Secondary | ICD-10-CM

## 2023-01-06 DIAGNOSIS — R1310 Dysphagia, unspecified: Secondary | ICD-10-CM | POA: Insufficient documentation

## 2023-01-06 DIAGNOSIS — Z79899 Other long term (current) drug therapy: Secondary | ICD-10-CM | POA: Insufficient documentation

## 2023-01-06 NOTE — Progress Notes (Incomplete)
Radiation Oncology         [phone] ________________________________  Name: Todd [name] MRN: [id number]  Date: 01/06/2023  DOB: [birth year]  Follow-Up Visit Note  Outpatient  CC: Tisovec, Adelfa Koh, MD  Tisovec, Adelfa Koh, MD  Diagnosis and Prior Radiotherapy:    ICD-10-CM   1. Malignant neoplasm of tonsillar fossa (HCC)  C09.0       Cancer Staging  Tonsil cancer [facility]) Staging form: Pharynx - Oropharynx, AJCC 7th Edition - Clinical stage from 02/25/2016: Stage IVB (T3, N3, M0) - Signed by Artis Delay, MD on 02/27/2016 Staged by: Managing physician Diagnostic confirmation: Positive histology Specimen type: Aspirate Histopathologic type: Squamous cell carcinoma, NOS Laterality: Bilateral  CHIEF COMPLAINT:  swallowing issues, post-radiation treatment.   Narrative:  Todd [name] presents for follow up of radiation completed 05/15/16 to his Tonsillar fossa.   He was experiencing severe dysphagia which promted a follow-up visit approximately 4 months ago. 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 We also referred the patient to [facility] for SLP and arranged a GI consult to rule out esophageal abnormalities. Both his surgeon and gastroenterologist believed his medical condition was too frail to pursue further aggressive treatment or evaluation. He has been seeing [name].   Pain issues, if any: Denies any mouth or throat pain Using a feeding tube?: Yes--recently placed -main source of nutrition Weight changes, if any:     Wt Readings from Last 3 Encounters:  01/06/23 145 lb (65.8 kg)  12/17/22 142 lb (64.4 kg)  11/07/22 148 lb (67.1 kg)    Swallowing issues, if any: Yes--was advised to only swallow water after MBSS. Wife reports he is doing weekly SLP sessions, and has been approved to try pureed foods. He is swallowing baby food and drinkable yogurt.  Smoking or chewing tobacco? None Using fluoride  toothpaste daily? Yes--confirms he sees a Theatre stage manager at least twice a year.  Last ENT visit was on: 12/11/2022 Saw Dr. Christia Reading:  --He has been advised against cervical spine surgery due to the potential risks outweighing the benefits. He recently received an injection in his neck from a pain specialist  --He is currently undergoing weekly speech therapy. Over the past year, he has experienced aspiration pneumonia three times. His wife notes that his condition has improved significantly since their last visit. Currently, he is only allowed to drink water and receives nutrition through a feeding tube. --The patient's difficulty swallowing is possibly due to radiation-induced scarring. A barium swallow test will be ordered to further investigate the cause of his dysphagia. If the barium swallow test does not yield helpful results, a consultation with a gastroenterologist will be considered for potential upper endoscopy. If the gastroenterologist is unable to perform the procedure, the option of blindly passing dilators will be considered. He is currently advised to drink only water to prevent aspiration pneumonia and continues to receive nutrition through a feeding tube.    Other notable issues, if any: Denies any new areas of swelling or lumps/bumps down neck. Wife has questions regarding:  Patient's hands and feet are always cold, and his BP tends to stay on the low side (improves some after weekly IVF sessions). She would like to know if a cardiology referral is warranted  Who will be able to do the esophageal stretching?   Can Dr. Basilio Cairo speak to tongue restriction related to cranial nerve #12.  ALLERGIES:  is allergic to phenergan [promethazine hcl], sulfamethoxazole-trimethoprim, and tamsulosin.  Meds: Current Outpatient Medications  Medication Sig Dispense Refill   gabapentin (NEURONTIN) 100 MG capsule Take 100 mg by mouth at bedtime.     acetaminophen (TYLENOL)  650 MG CR tablet Take 650 mg by mouth every 8 (eight) hours as needed for pain.     albuterol (VENTOLIN HFA) 108 (90 Base) MCG/ACT inhaler Inhale 2 puffs into the lungs as needed.     amLODipine (NORVASC) 10 MG tablet Take 1 tablet (10 mg total) by mouth daily. 30 tablet 1   Ascorbic Acid (VITAMIN C) 1000 MG tablet Take 200 mg by mouth daily.     aspirin EC 81 MG tablet Take 81 mg by mouth daily. Swallow whole.     cetirizine (ZYRTEC) 10 MG tablet Take 10 mg by mouth daily.     Coenzyme Q10 (COQ-10) 200 MG CAPS Take 1 capsule by mouth daily.     Famotidine (PEPCID PO) Take 1 tablet by mouth at bedtime.     fenofibrate (TRICOR) 145 MG tablet Take 145 mg by mouth daily.      glucosamine-chondroitin 500-400 MG tablet Take 2 tablets by mouth every morning.     guaiFENesin (MUCINEX) 600 MG 12 hr tablet Take 600 mg by mouth 2 (two) times daily as needed for cough or to loosen phlegm.     levothyroxine (SYNTHROID) 125 MCG tablet Take 125 mcg by mouth daily before breakfast.     Multiple Vitamin (MULTIVITAMIN) capsule Take 1 capsule by mouth daily.     No current facility-administered medications for this encounter.    Physical Findings:  The patient is in no acute distress. Patient is alert and oriented.  height is 5\' 9"  (1.753 m) and weight is 145 lb (65.8 kg). His temperature is 97.3 F (36.3 C) (abnormal). His blood pressure is 134/89 and his pulse is 71. His respiration is 20 and oxygen saturation is 98%. .     General: Alert and oriented, in no acute distress, quite hoarse HEENT: Head is normocephalic. Extraocular movements are intact. Oropharynx is clear of tumor but the oral tongue is notable for fasciculations and atrophy and his tongue deviates to the left Neck: Neck is supple, no palpable cervical or supraclavicular lymphadenopathy.  Fibrosis noted bilaterally. Heart: Regular in rate and rhythm with no murmurs, rubs, or gallops. Chest: Clear to auscultation bilaterally, with no rhonchi,  wheezes, or rales. Abdomen: Soft, nontender, nondistended, with no rigidity or guarding. PEG tube in place with no signs of infection.  Extremities: No cyanosis or edema. Lymphatics: see Neck Exam Skin: No concerning lesions. Musculoskeletal: Grossly symmetric strength and muscle tone throughout. Neurologic: Cranial nerves II through XII are grossly intact. No obvious focalities. Speech is fluent. Coordination is intact. Psychiatric: Judgment and insight are intact. Affect is appropriate.  ECOG=1  Lab Findings: Lab Results  Component Value Date   WBC 7.3 11/07/2022   HGB 12.7 (L) 11/07/2022   HCT 40.3 11/07/2022   MCV 85.2 11/07/2022   PLT 387 11/07/2022    Radiographic Findings: No results found.  Impression/Plan:   This is a patient well-known by me with a history of locally advanced oropharyngeal cancer treated in 2017.  Since we last saw him approximately 4 months ago, his dysphagia has been slowly improving with the help from SLP. He sees [name] once a week. Recommend continuing to follow with SLP.   He was evaluated by gastroenterology, and his orthopedic spine surgeon per  our recommendations. The specialists didn't think he was healthy enough for further evaluation or treatment at that time.   [name] has made a referral to otolaryngologist, Dr. Irene Pap, to help for further evaluation. We also personally messaged her, requesting that she see him. He will benefit from examination of his mouth/pharynx/larynx and esophagus to complete his work-up; the dysphagia is likely a multi-factorial issue.  He may have tongue /nerve damage from the previous tumor and radiation, as well as possibly scar tissue or injury of the throat/esophagus.  It is also possible his esophagus is compressed by his degenerative spine disease.  Patient complains of cold hands and feet today along with low blood pressure. His PCP is working this up this issue, so we will defer this to them.   CT of the neck  and chest w/ contrast in 3 months with an office visit to follow and review imaging. Past PET and MRI this year have been reassuring for likely remission. Patient knows to call in the interim with any questions or concerns.   On date of service, in total, I spent 30 minutes on this encounter. Patient was seen in person. Note signed after encounter date; minutes pertain to date of service, only.  _____________________________________   Joyice Faster, PA-C    Lonie Peak, MD

## 2023-01-07 ENCOUNTER — Inpatient Hospital Stay: Payer: BC Managed Care – PPO | Admitting: Dietician

## 2023-01-07 ENCOUNTER — Encounter: Payer: Self-pay | Admitting: Radiation Oncology

## 2023-01-07 ENCOUNTER — Inpatient Hospital Stay: Payer: BC Managed Care – PPO

## 2023-01-07 ENCOUNTER — Other Ambulatory Visit: Payer: Self-pay | Admitting: Radiology

## 2023-01-07 VITALS — BP 121/88 | HR 64 | Temp 98.0°F | Resp 17 | Wt 147.2 lb

## 2023-01-07 DIAGNOSIS — R634 Abnormal weight loss: Secondary | ICD-10-CM

## 2023-01-07 DIAGNOSIS — Z85810 Personal history of malignant neoplasm of tongue: Secondary | ICD-10-CM | POA: Diagnosis not present

## 2023-01-07 DIAGNOSIS — Y842 Radiological procedure and radiotherapy as the cause of abnormal reaction of the patient, or of later complication, without mention of misadventure at the time of the procedure: Secondary | ICD-10-CM | POA: Diagnosis not present

## 2023-01-07 DIAGNOSIS — Z85818 Personal history of malignant neoplasm of other sites of lip, oral cavity, and pharynx: Secondary | ICD-10-CM | POA: Diagnosis not present

## 2023-01-07 DIAGNOSIS — C090 Malignant neoplasm of tonsillar fossa: Secondary | ICD-10-CM | POA: Insufficient documentation

## 2023-01-07 DIAGNOSIS — C099 Malignant neoplasm of tonsil, unspecified: Secondary | ICD-10-CM

## 2023-01-07 DIAGNOSIS — R1310 Dysphagia, unspecified: Secondary | ICD-10-CM | POA: Diagnosis not present

## 2023-01-07 MED ORDER — SODIUM CHLORIDE 0.9 % IV SOLN: Freq: Once | INTRAVENOUS | Status: AC

## 2023-01-07 NOTE — Patient Instructions (Signed)

## 2023-01-07 NOTE — Progress Notes (Signed)
Nutrition Follow-up:  Patient completed concurrent chemoradiation for tonsil cancer. Final radiation 05/15/16 under the care of Dr. Basilio Cairo. Patient now with dysphagia and recurrent aspiration pneumonia. Pt currently receiving supportive care with IV fluids.    Met with pt and wife in infusion. He is pleased with wt gain today. Wife reports they will do weekly weigh at home tomorrow. Pt continues to follow with SLP. He is tolerating applesauce and some baby foods. Planning trials of drinkable yogurt next week. Pt is looking forward to meeting with new ENT in a couple of weeks. Wife reports fatigue continues and asking about exercise.    Medications: reviewed  Labs: no new labs  Anthropometrics: Wt 147 lb 3 oz today increased  8/7 - 142 lb 6/28 - 148 lb   Kcals: 4098-1191 (40-45 kcal/kg) Protein: 103-116 (1.6-1.8 g/kg) Fluid: >/= 2.2 L   NUTRITION DIAGNOSIS: Inadequate oral intake continues - addressing with TF     MALNUTRITION DIAGNOSIS: Severe malnutrition continues - addressing with TF     INTERVENTION:  Continue one carton Boost VHC via tube 5x/day Continue 30 ml ProSource once/day (60 kcal, 15g) Continue weekly weights at home  Educated on yoga/tai chi online classes offered via [facility] support services - flyer with registration instructions provided    MONITORING, EVALUATION, GOAL: wt trends, TF, diet advancement per SLP   NEXT VISIT: To be scheduled in collaboration with upcoming [facility] appointments

## 2023-01-08 ENCOUNTER — Ambulatory Visit: Payer: BC Managed Care – PPO

## 2023-01-08 DIAGNOSIS — R471 Dysarthria and anarthria: Secondary | ICD-10-CM | POA: Diagnosis not present

## 2023-01-08 DIAGNOSIS — R1312 Dysphagia, oropharyngeal phase: Secondary | ICD-10-CM | POA: Diagnosis not present

## 2023-01-08 NOTE — Therapy (Signed)
OUTPATIENT SPEECH LANGUAGE PATHOLOGY SWALLOW TREATMENT   Patient Name: Todd [name] MRN: [id number] DOB:01-15-1961, 62 y.o., male Today's Date: 01/08/2023  PCP: Hiram Comber, MD REFERRING PROVIDER: Lonie Peak, MD  END OF SESSION:  End of Session - 01/08/23 0944     Visit Number 8    Number of Visits 17    Date for SLP Re-Evaluation 01/30/23    SLP Start Time 0935    SLP Stop Time  1015    SLP Time Calculation (min) 40 min    Activity Tolerance Patient tolerated treatment well                 Past Medical History:  Diagnosis Date   Acquired hypothyroidism    secondary to radiation //   followed by pcp   Arthritis    shoulders, neck, knees, feet   Bilateral sensorineural hearing loss    due to radiation   Decreased range of motion of neck    due to fibrotic changes of 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   Denervation atrophy of muscle    tongue ,  chronic ,  due to radiation injury   (neurologist-- dr aKate Sable ([facility] [place])  EMG in care everywhere done 03-11-2022   Frequency of urination    GERD (gastroesophageal reflux disease)    History of cancer chemotherapy 2017   tonsil cancer   03-24-2016  to 05-06-2016   History of external beam radiation therapy    03-24-2016  to 05-15-2016  Left Tonsil and Bilateral Neck  @ [facility]   Hoarseness, chronic    due to radiation   Hyperlipidemia, mixed    Hypertension    Inguinal hernia, left    Lower facial weakness    intermittant left lower facial weakness   Numbness and tingling in left arm    intermittently per pt   Oropharyngeal dysphagia    hx neck radiation for left tonsil cancer ;  Speech therapy, regular diet with precautions swallows okay   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   followed by ENT dr bates/  oncologist--- dr Bertis Ruddy;  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;  completed chemo 05-06-2016 and completed radiation of left tonsil/ bilateral neck 05-15-2016  w/ radiation injury to tongue  (denervation chronic)   Wears glasses    Wears hearing aid in both ears    Past Surgical History:  Procedure Laterality Date   INGUINAL HERNIA REPAIR Left 05/02/2022   Procedure: OPEN LEFT INGUINAL HERNIA REPAIR WITH MESH;  Surgeon: Berna Bue, MD;  Location: [facility];  Service: General;  Laterality: Left;   IR GASTROSTOMY TUBE MOD SED  11/07/2022   IR GASTROSTOMY TUBE REMOVAL  01/30/2017   IR GENERIC HISTORICAL  03/20/2016   IR US GUIDE VASC ACCESS RIGHT 03/20/2016 Simonne Come, MD [facility]   IR GENERIC HISTORICAL  03/20/2016   IR FLUORO GUIDE PORT INSERTION RIGHT 03/20/2016 Simonne Come, MD [facility]   IR GENERIC HISTORICAL  03/20/2016   IR GASTROSTOMY TUBE MOD SED 03/20/2016 Simonne Come, MD [facility]   IR REMOVAL TUN ACCESS W/ PORT W/O FL MOD SED  01/30/2017   Patient Active Problem List   Diagnosis Date Noted   Malignant neoplasm of tonsillar fossa (HCC) 01/07/2023   Pulmonary infiltrates 09/08/2022   Hypothyroidism    Right hip pain 12/06/2018   CKD (chronic kidney disease), stage III (HCC) 11/30/2017   Anemia, chronic disease 11/30/2017   Multiple lung nodules on  CT 09/08/2016   Pancytopenia, acquired (HCC) 05/26/2016   Deficiency anemia 05/26/2016   Splenic lesion 05/16/2016   Jaw pain 04/08/2016   Essential hypertension 03/18/2016   Weight loss 02/26/2016   Tonsil cancer (HCC) 02/21/2016    ONSET DATE: 2018   REFERRING DIAG: C09.9 (ICD-10-CM) - Tonsil cancer   THERAPY DIAG:  Dysphagia, oropharyngeal phase  Dysarthria and anarthria  Rationale for Evaluation and Treatment: Rehabilitation  SUBJECTIVE:   SUBJECTIVE STATEMENT: No overt s/sx aspiration PNA today. Pt accompanied by: significant other  PERTINENT HISTORY: From Dr. Basilio Cairo note May 2024: The patient returns today for follow-up after he approached me with new symptoms at our head and neck annual survivorship celebration.  Todd [name] presents for follow up of radiation  completed 05/15/16 to his Tonsillar fossa/oropharynx.  Recent PET and MRI below were reviewed at tumor board, NED.  See below. 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  He is also losing weight.  PAIN:  Are you having pain? Yes: NPRS scale: 4/10 Pain location: cervical neck down to mid-back Pain description: annoying pain, makes it hard to sleep Aggravating factors: certain movement Relieving factors: meds  FALLS: Has patient fallen in last 6 months?  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 SLP reviewed note from [facility] at [facility] in 2023/11 but could not view images. 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 Head tilt to the right attempted to aid oral transiting/control with partial effectiveness. Pharyngeal swallow marked by sevfere hypomotility resulting in minimal transiting of barium into esophagus and laryngeal penetration/aspiration. Mulitple dry swallows *pt initiated* is helpful to decrease amount of retention but does not fuly clear it, even after 5-6 swallows with small single liquid bolus. Head of chair reclined to approx 45* with slight neck extension did decrease amount of penetration and aspiration as gravity faciliated retention of barium in  posterior pharynx. Even in posterior position and with use of right tongue for oral transiting, pt penetrated very small bite of masticated cracker after the swallow as pharyngeal retention spilled into open airway. Cued cough effectively cleared cracker into pharynx for pt to swallow with liquids. Clearance into cervical esophagus was significantly impaired with gross retention of liquids as pyriform sinus. Question if he may benefit from intervention for PES opening to mitigate his dysphagia - advised he speak to Dr Jenne Pane, ENT, re: potential.   Unfortunately [facility] current dysphagia is profound and given progressive deficit (presumed from XRT effects, fibrosis) for 15 months, prognosis for swallow to return to functional level is guarded at best.    Advised pt consider ordering antichoking device for emergent use.  Recommended diet:PO diet PO Diet Recommendation: Thin liquids (Level 0); Mildly thick liquids (Level 2, nectar thick); Full liquid diet, some soft solids Liquid Administration via: Cup; Straw; Spoon Medication Administration: Other (Comment) (consider suspension). Supervision: Patient able to  self-feed Recommended compensations:  Swallowing strategies  : effortful swallow; Multiple dry swallows after each bite/sip; Hard cough after swallowing; Head tilt right during swallowing; Slow rate; Small bites/sips (Recline approx 45* with slight chin elevation), Follow solids with liquids Postural changes: Partially reclined for meals Oral care recommendations: Oral care QID (4x/day) Recommended consults: Consider ENT consultation (? ENT to determine if potential PES treatment may be helpful)   PATIENT REPORTED OUTCOME MEASURES (PROM): EAT-10: Pt returned 12/05/22 and scored 36/40 (lower scores = better QOL)   TODAY'S TREATMENT:                                                                                                                                         DATE:  01/08/23: SLP   educated pt/wife rationale for waiting to schedule MBS/FEES until mid-September or later will give Todd [name] ample time for strength gain in swallowing musculature (at least 8 weeks). No overt s/sx aspiration PNA this session; pt ate small spoons of baby food yams, and drank water. SLP had to provide initial cue to cough after a bite of yams (pt was inconsistent) but then Todd [name] cont'd with consistent coughing after last swallow of bolus. PT to cont with solid POs and liquid POs at home. Appointment with Dr. Irene Pap is 01/19/23. Min A with HEP for tongue protrusion on Masako. Will keep pt once/week to monitor for s/sx PNA and to possibly upgrade pt to regular pureed (instead of baby food) after assessment in-session.  01/01/23: Pt reports difficult to get trigger on first 1-3 reps of Masako and supraglottic but once he passes these number of reps - no difficulty with triggering swallow. No problem at all triggering effortful swallow from rep #1 on. Today pt did not have difficulty with triggering swallows with Masako. Although lingual protrusion was adequate, SLP told pt to protrude tongue a bit further for the first 2 reps and he could do so. SLP told pt protruding tongue to that length was preferable to the length he currently uses, so he should try to have more reps with further protrusion. Last session, SLP added dynamic Shaker to pt's regimen, 30 1-second reps at least BID. With POs, pt made through one 4 oz jar in one week. SLP suggested pt use drinkable yogurt, Fair life milk, or Boost as POs as well, as long as pt is using safe swallow techniques from [employer] 10/30/22. Pt req'd cues for a cough after every bite/sip, and SLP provided rationale and reminder that pt's sensation was not  100% when material was penetrated.  12/26/22: No s/sx any aspiration PNA today, nor in this last week at all. Pt cont to perform PO trials with water at home, but coughs with every 3-4th swallow. SLP strongly urged pt to complete cough  every swallow based upon MBS results. Pt followed other precautions with independence. SLP trialed baby food applesauce with pt with liquid  wash and completed with success. SLP told pt to try 10 boluses as many times/day as desired but always stopping if he feels he has less than 8/10 stamina (10=WNL), and always perform thorough oral care before and after POs. SLP also suggested pt could try baby food applesauce-consistency pudding or yogurt. Pt completed HEP today independently. Due to initiating puree today pt will keep frequency at once/week.   12/19/22: No s/sx any aspiration PNA today, nor in this last week at all. Performed oral care just prior to appointment today. Pt independent with HEP today. Protruded tongue past lips independently. Pt cont to take one bottle of water per day, multiple (x2-3) sips per bolus with multiple hard swallows and cough if necessary. Instead, SLP told pt to take one sip with two-three hard swallows, cough, reswallow hard. SLP to attempt smooth purees with pt next session -will have been 4 weeks since initiation of HEP and pt without any overt s/sx aspiration PNA at this time.  12/12/22: Pt performed oral care prior to ST. Long discussion about whether or not BaSwallow exam would be safe for pt at this time. Afterwards, SLP emailed SLP who performed MBS with pt and she stated BaSwallow exam might be more difficult to perform safely at this time. Pt performed HEP independently and used safe swallow techniques with sips of water.   12/05/22: Pt independent with HEP today, will use mirror for Masako until extent of tongue protrusion habituated. Pt demo'd strategies with sip water today, independently. Pt and SLP agree pt can reduce to once/week. SLP provided s/sx aspiration PNA and pt with mod I.  12/03/22: Pt has been performing HEP regularly. Dora Sims he performed exercises with initial mod cue for Masako (tongue protrusion).  He drank water without overt s/sx aspiration x3 using  safe swallow strategies independently.  11/24/22:  Today SLP explained results of pt's MBS and the rationale for the safe swallow strategies. Pt demonstrated understanding of each and reports he has been using these with one bottle of water each day. No overt s/sx of aspiration PNA to date with >1 week of PO water. Pt has NOT been performing oral care prior to POs. SLP educated pt re: swallow HEP procedure. Pt performed each exercise and SLP ensured correctness prior to moving to next exercise. Pt req'd min A with strength of cough with supraglottic.  PATIENT EDUCATION: Education details: see "today's treatment" Person educated: Patient and Spouse Education method: Explanation, Demonstration, Verbal cues, and Handouts Education comprehension: verbalized understanding, returned demonstration, verbal cues required, and needs further education   ASSESSMENT:  CLINICAL IMPRESSION: Added LTG #5. Patient is a 62 y.o. M who was seen today for treatment of swallowing. SLP reviewed pt's swallowing HEP and ensured pt is safe and correct with safe swallow strategies. If progress is seen with HEP by the end of September, he shold be referred for follow-up MBS to monitor progress. Please see "today's treatment" for more details. It should be noted that pt's prognosis after June 2024 MBS, improvement for a functional swallow was GUARDED. Todd [name] cont amenable to complete swallow HEP and with improvement, agrees to complete another MBS at a clinically appropriate time.  At this time pt's speech is less concerning however pt may benefit from a ST course for mastering speech compensations to improve speech intelligibility.  OBJECTIVE IMPAIRMENTS: include dysarthria and dysphagia. These impairments are limiting patient from ADLs/IADLs, effectively communicating at home and in community, and safety when swallowing. Factors affecting potential to achieve goals and  functional outcome are co-morbidities and severity of  impairments. Patient will benefit from skilled SLP services to address above impairments and improve overall function.  REHAB POTENTIAL: Fair given prognosis from MBS   GOALS: Goals reviewed with patient? Yes  SHORT TERM GOALS: Target date: 12/25/22  Pt will perform HEP with rare min A in two sessions Baseline:12/03/22 Goal status: Met  2.  Pt will follow prescribed safe swallow strategies in 3 sessions Baseline: 12/03/22, 12/12/22 Goal status: Partially met  3.  Pt will perform HEP 6/7 days a week for two weeks, per HEP monitoring method Baseline:  Goal status: MET  4.  Pt and/or wife will ID 3 overt s/sx aspiration PNA with modified independence Baseline:  Goal status: met   LONG TERM GOALS: Target date: 01/30/23  Pt will perform HEP with rare min A in two sessions Baseline: 12/19/22 Goal status: Met  2.  Pt will follow prescribed safe swallow strategies in 3 sessions, after 12/25/22 Baseline: 01/08/23 Goal status: INITIAL  3.  Pt will undergo follow up MBS when/if clinically indicated Baseline:  Goal status: INITIAL  4.  Pt will score higher on PROM than initial score Baseline:  Goal status: INITIAL  5.   Pt will perform HEP with independently in three sessions Baseline:  Goal status: Met   PLAN:  SLP FREQUENCY: 1x/week  SLP DURATION: 8 weeks  PLANNED INTERVENTIONS: Aspiration precaution training, Pharyngeal strengthening exercises, Diet toleration management , Environmental controls, Trials of upgraded texture/liquids, Cueing hierachy, Internal/external aids, SLP instruction and feedback, Compensatory strategies, Patient/family education, and Repeat MBS PRN    Todd [name], CCC-SLP 01/08/2023, 12:58 PM

## 2023-01-10 ENCOUNTER — Other Ambulatory Visit: Payer: Self-pay

## 2023-01-10 ENCOUNTER — Inpatient Hospital Stay: Payer: BC Managed Care – PPO

## 2023-01-10 VITALS — BP 117/74 | HR 60 | Temp 97.2°F | Resp 16

## 2023-01-10 DIAGNOSIS — C099 Malignant neoplasm of tonsil, unspecified: Secondary | ICD-10-CM

## 2023-01-10 DIAGNOSIS — R634 Abnormal weight loss: Secondary | ICD-10-CM | POA: Diagnosis not present

## 2023-01-10 DIAGNOSIS — Z85810 Personal history of malignant neoplasm of tongue: Secondary | ICD-10-CM | POA: Diagnosis not present

## 2023-01-10 MED ORDER — SODIUM CHLORIDE 0.9 % IV SOLN: Freq: Once | INTRAVENOUS | Status: AC

## 2023-01-10 NOTE — Patient Instructions (Signed)

## 2023-01-14 ENCOUNTER — Telehealth: Payer: Self-pay | Admitting: Hematology and Oncology

## 2023-01-14 NOTE — Telephone Encounter (Signed)
 Patient's spouse is aware of scheduled appointment times/dates

## 2023-01-16 ENCOUNTER — Ambulatory Visit: Payer: BC Managed Care – PPO | Attending: Radiation Oncology

## 2023-01-16 DIAGNOSIS — R471 Dysarthria and anarthria: Secondary | ICD-10-CM | POA: Insufficient documentation

## 2023-01-16 DIAGNOSIS — R1312 Dysphagia, oropharyngeal phase: Secondary | ICD-10-CM | POA: Insufficient documentation

## 2023-01-16 NOTE — Therapy (Signed)
OUTPATIENT SPEECH LANGUAGE PATHOLOGY SWALLOW TREATMENT   Patient Name: Todd [name] MRN: [id number] DOB:[birth year], 62 y.o., male Today's Date: 01/16/2023  PCP: Hiram Comber, MD REFERRING PROVIDER: Lonie Peak, MD  END OF SESSION:  End of Session - 01/16/23 2306     Visit Number 9    Number of Visits 17    Date for SLP Re-Evaluation 01/30/23    SLP Start Time 1021    SLP Stop Time  1100    SLP Time Calculation (min) 39 min    Activity Tolerance Patient tolerated treatment well                  Past Medical History:  Diagnosis Date   Acquired hypothyroidism    secondary to radiation //   followed by pcp   Arthritis    shoulders, neck, knees, feet   Bilateral sensorineural hearing loss    due to radiation   Decreased range of motion of neck    due to fibrotic changes of 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   Denervation atrophy of muscle    tongue ,  chronic ,  due to radiation injury   (neurologist-- dr aKate Sable ([facility] [place])  EMG in care everywhere done 03-11-2022   Frequency of urination    GERD (gastroesophageal reflux disease)    History of cancer chemotherapy 2017   tonsil cancer   03-24-2016  to 05-06-2016   History of external beam radiation therapy    03-24-2016  to 05-15-2016  Left Tonsil and Bilateral Neck  @ [facility]   Hoarseness, chronic    due to radiation   Hyperlipidemia, mixed    Hypertension    Inguinal hernia, left    Lower facial weakness    intermittant left lower facial weakness   Numbness and tingling in left arm    intermittently per pt   Oropharyngeal dysphagia    hx neck radiation for left tonsil cancer ;  Speech therapy, regular diet with precautions swallows okay   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   followed by ENT dr bates/  oncologist--- dr Bertis Ruddy;  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;  completed chemo 05-06-2016 and completed radiation of left tonsil/ bilateral neck 05-15-2016  w/ radiation injury to tongue  (denervation chronic)   Wears glasses    Wears hearing aid in both ears    Past Surgical History:  Procedure Laterality Date   INGUINAL HERNIA REPAIR Left 05/02/2022   Procedure: OPEN LEFT INGUINAL HERNIA REPAIR WITH MESH;  Surgeon: Berna Bue, MD;  Location: [facility];  Service: General;  Laterality: Left;   IR GASTROSTOMY TUBE MOD SED  11/07/2022   IR GASTROSTOMY TUBE REMOVAL  01/30/2017   IR GENERIC HISTORICAL  03/20/2016   IR US GUIDE VASC ACCESS RIGHT 03/20/2016 Simonne Come, MD [facility]   IR GENERIC HISTORICAL  03/20/2016   IR FLUORO GUIDE PORT INSERTION RIGHT 03/20/2016 Simonne Come, MD [facility]   IR GENERIC HISTORICAL  03/20/2016   IR GASTROSTOMY TUBE MOD SED 03/20/2016 Simonne Come, MD [facility]   IR REMOVAL TUN ACCESS W/ PORT W/O FL MOD SED  01/30/2017   Patient Active Problem List   Diagnosis Date Noted   Malignant neoplasm of tonsillar fossa (HCC) 01/07/2023   Pulmonary infiltrates 09/08/2022   Hypothyroidism    Right hip pain 12/06/2018   CKD (chronic kidney disease), stage III (HCC) 11/30/2017   Anemia, chronic disease 11/30/2017   Multiple lung nodules  on CT 09/08/2016   Pancytopenia, acquired (HCC) 05/26/2016   Deficiency anemia 05/26/2016   Splenic lesion 05/16/2016   Jaw pain 04/08/2016   Essential hypertension 03/18/2016   Weight loss 02/26/2016   Tonsil cancer (HCC) 02/21/2016    ONSET DATE: 2018   REFERRING DIAG: C09.9 (ICD-10-CM) - Tonsil cancer   THERAPY DIAG:  Dysphagia, oropharyngeal phase  Dysarthria and anarthria  Rationale for Evaluation and Treatment: Rehabilitation  SUBJECTIVE:   SUBJECTIVE STATEMENT: "I brought the drinkable yogurt today." No overt s/sx aspiration PNA today. Pt accompanied by: significant other  PERTINENT HISTORY: From Dr. Basilio Cairo note May 2024: The patient returns today for follow-up after [name] approached me with new symptoms at our head and neck annual survivorship celebration.  Todd [name]  presents for follow up of radiation completed 05/15/16 to his Tonsillar fossa/oropharynx.  Recent PET and MRI below were reviewed at tumor board, NED.  See below. 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  [name] is also losing weight.  PAIN:  Are you having pain? Yes: NPRS scale: 4/10 Pain location: cervical neck down to mid-back Pain description: annoying pain, makes it hard to sleep Aggravating factors: certain movement Relieving factors: meds  FALLS: Has patient fallen in last 6 months?  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 SLP reviewed [facility] from [facility] in [facility]23/11 but could not view images. 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 Head tilt to the right attempted to aid oral transiting/control with partial effectiveness. Pharyngeal swallow marked by sevfere hypomotility resulting in minimal transiting of barium into esophagus and laryngeal penetration/aspiration. Mulitple dry swallows *pt initiated* is helpful to decrease amount of retention but does not fuly clear it, even after 5-6 swallows with small single liquid bolus. Head of chair reclined to approx 45* with slight neck extension did decrease amount of penetration and aspiration as gravity  faciliated retention of barium in posterior pharynx. Even in posterior position and with use of right tongue for oral transiting, pt penetrated very small bite of masticated cracker after the swallow as pharyngeal retention spilled into open airway. Cued cough effectively cleared cracker into pharynx for pt to swallow with liquids. Clearance into cervical esophagus was significantly impaired with gross retention of liquids as pyriform sinus. Question if [name] may benefit from intervention for PES opening to mitigate his dysphagia - advised [name] speak to Dr Jenne Pane, ENT, re: potential.   Unfortunately [facility] current dysphagia is profound and given progressive deficit (presumed from XRT effects, fibrosis) for 15 months, prognosis for swallow to return to functional level is guarded at best.    Advised pt consider ordering antichoking device for emergent use.  Recommended diet:PO diet PO Diet Recommendation: Thin liquids (Level 0); Mildly thick liquids (Level 2, nectar thick); Full liquid diet, some soft solids Liquid Administration via: Cup; Straw; Spoon Medication Administration: Other (  Comment) (consider suspension). Supervision: Patient able to self-feed Recommended compensations:  Swallowing strategies  : effortful swallow; Multiple dry swallows after each bite/sip; Hard cough after swallowing; Head tilt right during swallowing; Slow rate; Small bites/sips (Recline approx 45* with slight chin elevation), Follow solids with liquids Postural changes: Partially reclined for meals Oral care recommendations: Oral care QID (4x/day) Recommended consults: Consider ENT consultation (? ENT to determine if potential PES treatment may be helpful)   PATIENT REPORTED OUTCOME MEASURES (PROM): EAT-10: Pt returned 12/05/22 and scored 36/40 (lower scores = better QOL)   TODAY'S TREATMENT:                                                                                                                                          DATE:  01/16/23: Pt to see Dr. Irene Pap 01/19/23. Pt performed oral care prior to ST today. SLP plan is to have pt perform dysphagia HEP for approx 10 weeks prior to [facility] (which will be end of September). Pt appears to cont to gain swallow strength week after week. With POs, >75% of the time Todd [name] had audible swallow, possibly insufficient velopharyngeal closure, as pt again (as in previous session) sniffed after swallowing water. To this point, pt has remained free of overt s/sx aspiration PNA and has followed safe swallow strategies in sessions, sometimes with rare initial reminder to cough/clear throat after 2nd or 3rd swallow with each bolus. Wet voice heard approx 50% of the time with yogurt and water, mostly cleared with multiple swallows. 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  Pt was independent with HEP.   01/08/23: SLP  educated pt/wife rationale for waiting to schedule MBS/FEES until mid-September or later will give [place] ample time for strength gain in swallowing musculature (at least 8 weeks). No overt s/sx aspiration PNA this session; pt ate small spoons of baby food yams, and drank water. SLP had to provide initial cue to cough after a bite of yams (pt was inconsistent) but then Todd [name] cont'd with consistent coughing after last swallow of bolus. PT to cont with solid POs and liquid POs at home. Appointment with Dr. Irene Pap is 01/19/23. Min A with HEP for tongue protrusion on Masako. Will keep pt once/week to monitor for s/sx PNA and to possibly upgrade pt to regular pureed (instead of baby food) after assessment in-session.  01/01/23: Pt reports difficult to get trigger on first 1-3 reps of Masako and supraglottic but once [name] passes these number of reps - no difficulty with triggering swallow. No problem at all triggering effortful swallow from rep #1 on. Today pt did not have difficulty with triggering swallows with Masako. Although lingual  protrusion was adequate, SLP told pt to protrude tongue a bit further for the first 2 reps and [name] could do so. SLP told pt protruding tongue to that length  was preferable to the length [name] currently uses, so [name] should try to have more reps with further protrusion. Last session, SLP added dynamic Shaker to pt's regimen, 30 1-second reps at least BID. With POs, pt made through one 4 oz jar in one week. SLP suggested pt use drinkable yogurt, Fair life milk, or Boost as POs as well, as long as pt is using safe swallow techniques from [facility] 10/30/22. Pt req'd cues for a cough after every bite/sip, and SLP provided rationale and reminder that pt's sensation was not  100% when material was penetrated.  12/26/22: No s/sx any aspiration PNA today, nor in this last week at all. Pt cont to perform PO trials with water at home, but coughs with every 3-4th swallow. SLP strongly urged pt to complete cough every swallow based upon MBS results. Pt followed other precautions with independence. SLP trialed baby food applesauce with pt with liquid wash and completed with success. SLP told pt to try 10 boluses as many times/day as desired but always stopping if [name] feels [name] has less than 8/10 stamina (10=WNL), and always perform thorough oral care before and after POs. SLP also suggested pt could try baby food applesauce-consistency pudding or yogurt. Pt completed HEP today independently. Due to initiating puree today pt will keep frequency at once/week.   12/19/22: No s/sx any aspiration PNA today, nor in this last week at all. Performed oral care just prior to appointment today. Pt independent with HEP today. Protruded tongue past lips independently. Pt cont to take one bottle of water per day, multiple (x2-3) sips per bolus with multiple hard swallows and cough if necessary. Instead, SLP told pt to take one sip with two-three hard swallows, cough, reswallow hard. SLP to attempt smooth purees with pt next session -will have been 4  weeks since initiation of HEP and pt without any overt s/sx aspiration PNA at this time.  12/12/22: Pt performed oral care prior to ST. Long discussion about whether or not BaSwallow exam would be safe for pt at this time. Afterwards, SLP emailed SLP who performed MBS with pt and she stated BaSwallow exam might be more difficult to perform safely at this time. Pt performed HEP independently and used safe swallow techniques with sips of water.   12/05/22: Pt independent with HEP today, will use mirror for Masako until extent of tongue protrusion habituated. Pt demo'd strategies with sip water today, independently. Pt and SLP agree pt can reduce to once/week. SLP provided s/sx aspiration PNA and pt with mod I.  12/03/22: Pt has been performing HEP regularly. Todd [name] performed exercises with initial mod cue for Masako (tongue protrusion).  [name] drank water without overt s/sx aspiration x3 using safe swallow strategies independently.  11/24/22:  Today SLP explained results of pt's MBS and the rationale for the safe swallow strategies. Pt demonstrated understanding of each and reports [name] has been using these with one bottle of water each day. No overt s/sx of aspiration PNA to date with >1 week of PO water. Pt has NOT been performing oral care prior to POs. SLP educated pt re: swallow HEP procedure. Pt performed each exercise and SLP ensured correctness prior to moving to next exercise. Pt req'd min A with strength of cough with supraglottic.  PATIENT EDUCATION: Education details: see "today's treatment" Person educated: Patient and Spouse Education method: Explanation, Demonstration, Verbal cues, and Handouts Education comprehension: verbalized understanding, returned demonstration, verbal cues required, and needs further education   ASSESSMENT:  CLINICAL IMPRESSION: Added LTG #5. Patient is a 62 y.o. M who was seen today for treatment of swallowing. SLP reviewed pt's swallowing HEP and ensured pt is  safe and correct with safe swallow strategies. If progress is seen with HEP by the end of September, [name] shold be referred for follow-up MBS to monitor progress. Please see "today's treatment" for more details. It should be noted that pt's prognosis after June 2024 MBS, improvement for a functional swallow was GUARDED. Todd [name] cont amenable to complete swallow HEP and with improvement, agrees to complete another MBS at a clinically appropriate time.  At this time pt's speech is less concerning however pt may benefit from a ST course for mastering speech compensations to improve speech intelligibility.  OBJECTIVE IMPAIRMENTS: include dysarthria and dysphagia. These impairments are limiting patient from ADLs/IADLs, effectively communicating at home and in community, and safety when swallowing. Factors affecting potential to achieve goals and functional outcome are co-morbidities and severity of impairments. Patient will benefit from skilled SLP services to address above impairments and improve overall function.  REHAB POTENTIAL: Fair given prognosis from MBS   GOALS: Goals reviewed with patient? Yes  SHORT TERM GOALS: Target date: 12/25/22  Pt will perform HEP with rare min A in two sessions Baseline:12/03/22 Goal status: Met  2.  Pt will follow prescribed safe swallow strategies in 3 sessions Baseline: 12/03/22, 12/12/22 Goal status: Partially met  3.  Pt will perform HEP 6/7 days a week for two weeks, per HEP monitoring method Baseline:  Goal status: MET  4.  Pt and/or wife will ID 3 overt s/sx aspiration PNA with modified independence Baseline:  Goal status: met   LONG TERM GOALS: Target date: 01/30/23  Pt will perform HEP with rare min A in two sessions Baseline: 12/19/22 Goal status: Met  2.  Pt will follow prescribed safe swallow strategies in 3 sessions, after 12/25/22 Baseline: 01/08/23, 01/16/23 Goal status: INITIAL  3.  Pt will undergo follow up MBS when/if clinically  indicated Baseline:  Goal status: INITIAL  4.  Pt will score higher on PROM than initial score Baseline:  Goal status: INITIAL  5.   Pt will perform HEP with independently in three sessions Baseline: 01/16/23 Goal status: Met   PLAN:  SLP FREQUENCY: 1x/week  SLP DURATION: 8 weeks  PLANNED INTERVENTIONS: Aspiration precaution training, Pharyngeal strengthening exercises, Diet toleration management , Environmental controls, Trials of upgraded texture/liquids, Cueing hierachy, Internal/external aids, SLP instruction and feedback, Compensatory strategies, Patient/family education, and Repeat MBS PRN    Frederich Montilla, CCC-SLP 01/16/2023, 11:06 PM

## 2023-01-19 ENCOUNTER — Encounter (INDEPENDENT_AMBULATORY_CARE_PROVIDER_SITE_OTHER): Payer: Self-pay | Admitting: Otolaryngology

## 2023-01-19 ENCOUNTER — Ambulatory Visit (INDEPENDENT_AMBULATORY_CARE_PROVIDER_SITE_OTHER): Payer: BC Managed Care – PPO | Admitting: Otolaryngology

## 2023-01-19 VITALS — BP 101/62 | HR 66 | Ht 70.0 in | Wt 151.0 lb

## 2023-01-19 DIAGNOSIS — R1310 Dysphagia, unspecified: Secondary | ICD-10-CM

## 2023-01-19 DIAGNOSIS — K1379 Other lesions of oral mucosa: Secondary | ICD-10-CM | POA: Diagnosis not present

## 2023-01-19 DIAGNOSIS — R1312 Dysphagia, oropharyngeal phase: Secondary | ICD-10-CM

## 2023-01-19 DIAGNOSIS — J3801 Paralysis of vocal cords and larynx, unilateral: Secondary | ICD-10-CM

## 2023-01-19 DIAGNOSIS — K148 Other diseases of tongue: Secondary | ICD-10-CM

## 2023-01-19 DIAGNOSIS — Z923 Personal history of irradiation: Secondary | ICD-10-CM

## 2023-01-19 DIAGNOSIS — Z85819 Personal history of malignant neoplasm of unspecified site of lip, oral cavity, and pharynx: Secondary | ICD-10-CM

## 2023-01-19 DIAGNOSIS — T17908A Unspecified foreign body in respiratory tract, part unspecified causing other injury, initial encounter: Secondary | ICD-10-CM

## 2023-01-19 NOTE — Progress Notes (Unsigned)
ENT CONSULT:  Reason for Consult: dysphagia hx of oropharyngeal cancer and cXRT  HPI: Todd [name] is an 62 y.o. male with hx of oropharyngeal SCCa stage IVB (T3N3M0) diagnosed 2017, s/p primary cXRT who is here for evaluation of severe dysphagia.  He was referred by Dr. Basilio Cairo and his current SLP, due to significant decline in swallowing function approximately 1 year ago and the need to rely on PEG for nutrition support secondary to four episodes of aspiration pneumonia.  He was previously diagnosed and followed by Dr. Jenne Pane ENT, who diagnosed his left oropharyngeal cancer, tonsil being primary site. He reports that approximately a year ago he started to have significant issues with his speech and swallowing.  Following primary chemoradiation for oropharyngeal cancer he was able to eat everything without problems, and his speech was normal.  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 Last spring approximately in April and May of 2024, he began to develop chills and fevers at home.  Oropharyngeal cancer recurrence was considered and he had PET/CT, neck MRI and chest CT. there was no evidence of disease recurrence however imaging demonstrated bilateral pneumonia and borderline enlarged mediastinal nodes.  Since then he had 4 episodes of pneumonia which was thought to be due to aspiration.  He was seen by pulmonary Dr. Celine Mans in May 2024.  Record review indicates that Dr. Celine Mans advised him to see his speech therapist, and stated in his note from May of 17th at the most likely cause of bilateral pneumonia is aspiration.  Plan comes to his swallowing, initially he noticed that he began to choke on both liquids and solids approximately a year ago.  He would have mucus and productive cough. Denies dyspnea with PNA and would get chills and fevers. Then he would develop productive cough.  After repeat modified barium swallow in June, due to concern for  recurrent aspiration pneumonia and significant weight loss, he underwent PEG placement.  He is consuming liquids such as water coffee and orange juice by mouth but supplements calories with boost through the PEG.  He also has a history of cervical myelopathy with bilateral numbness and tingling in his hands.  Had 2 neurosurgical consultations, locally and at [facility], and due to history of radiation was advised not to undergo procedure because the risks would be significant if ACDF was attempted.  Seeing neurology currently, although unclear what was the initial consult reason. Being f/b Neurology Dr Noreene Filbert, at [facility], and has a scheduled appt 03/02/23     Records Reviewed: Multiple office notes reviewed most pertinent summarized below and in HPI MBS 10/30/2022 Clinical Impression: Clinical Impression: Patient presents with mild oral and severe pharyngo-cervical esophageal dysphagia presumed due to iatrogenic effects of XRT that is significantly worse compared to prior MBS conducted by this SLP in 2018.   SLP reviewed note from [facility] at [facility] in 2023/11 but could not view images. 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  Head tilt to the right attempted to aid oral transiting/control with partial effectiveness.  Pharyngeal swallow marked by severe hypomotility resulting in minimal transiting of barium into esophagus and laryngeal penetration/aspiration. Mulitple dry swallows *pt initiated* is helpful to decrease amount of retention but does not fuly clear it, even after 5-6 swallows with small single liquid bolus.  Head of chair reclined to approx 45* with slight neck extension  did decrease amount of penetration and aspiration as gravity faciliated retention of barium in posterior pharynx.  Even in posterior position and with use of right tongue for oral transiting, pt penetrated very small  bite of masticated cracker after the swallow as pharyngeal retention spilled into open airway.  Cued cough effectively cleared cracker into pharynx for pt to swallow with liquids.  Clearance into cervical esophagus was significantly impaired with gross retention of liquids as pyriform sinus.         At this time, pt is very high aspiration and malnutrition, dehydration risk with his current level of dysphagia.  Note plan is for PEG next week *11/07/2022  per pt and SLP agrees this is warranted.  SLP reviewed MBS extensively with pt and reinforced most effective compensation strategies.     Encouraged pt to continue po with strict adherence to compensations including head of bed/chair reclined, chin elevated, multiple swallows per bolus, cough/throat clear and expectorate prn.      Question if he may benefit from intervention for PES opening to mitigate his dysphagia - advised he speak to Dr Jenne Pane, ENT, re: potential.     Unfortunately [name] current dysphagia is profound and given progressive deficit (presumed from XRT effects, fibrosis) for 15 months, prognosis for swallow to return to functional level is guarded at best.    Advised pt consider ordering antichoking device for emergent use.  Thanks so much for this consult.   Factors that may increase risk of adverse event in presence of aspiration [name] & [name] 2021): Factors that may increase risk of adverse event in presence of aspiration [name] & [name] 2021): Reduced saliva    Past Medical History:  Diagnosis Date   Acquired hypothyroidism    secondary to radiation //   followed by pcp   Arthritis    shoulders, neck, knees, feet   Bilateral sensorineural hearing loss    due to radiation   Decreased range of motion of neck    due to fibrotic changes of 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   Denervation atrophy of muscle    tongue ,  chronic ,  due to radiation injury   (neurologist-- dr aKate Sable ([facility] [place])  EMG in care  everywhere done 03-11-2022   Frequency of urination    GERD (gastroesophageal reflux disease)    History of cancer chemotherapy 2017   tonsil cancer   03-24-2016  to 05-06-2016   History of external beam radiation therapy    03-24-2016  to 05-15-2016  Left Tonsil and Bilateral Neck  @ [facility]   Hoarseness, chronic    due to radiation   Hyperlipidemia, mixed    Hypertension    Inguinal hernia, left    Lower facial weakness    intermittant left lower facial weakness   Numbness and tingling in left arm    intermittently per pt   Oropharyngeal dysphagia    hx neck radiation for left tonsil cancer ;  Speech therapy, regular diet with precautions swallows okay   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   followed by ENT dr bates/  oncologist--- dr Bertis Ruddy;  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;  completed chemo 05-06-2016 and completed radiation of left tonsil/ bilateral neck 05-15-2016  w/ radiation injury to tongue (denervation chronic)   Wears glasses    Wears hearing aid in both ears     Past Surgical History:  Procedure Laterality Date   INGUINAL HERNIA REPAIR Left 05/02/2022  Procedure: OPEN LEFT INGUINAL HERNIA REPAIR WITH MESH;  Surgeon: Berna Bue, MD;  Location: [facility];  Service: General;  Laterality: Left;   IR GASTROSTOMY TUBE MOD SED  11/07/2022   IR GASTROSTOMY TUBE REMOVAL  01/30/2017   IR GENERIC HISTORICAL  03/20/2016   IR US GUIDE VASC ACCESS RIGHT 03/20/2016 Simonne Come, MD [facility]   IR GENERIC HISTORICAL  03/20/2016   IR FLUORO GUIDE PORT INSERTION RIGHT 03/20/2016 Simonne Come, MD [facility]   IR GENERIC HISTORICAL  03/20/2016   IR GASTROSTOMY TUBE MOD SED 03/20/2016 Simonne Come, MD [facility]   IR REMOVAL TUN ACCESS W/ PORT W/O FL MOD SED  01/30/2017    Family History  Problem Relation Age of Onset   Dementia Father    Diabetes Father    Cancer Maternal Grandmother        unknown ca   Asthma Son     Social History:   reports that he has never smoked. He has never used smokeless tobacco. He reports that he does not drink alcohol and does not use drugs.  Allergies:  Allergies  Allergen Reactions   Phenergan [Promethazine Hcl]     Got really nervous and shaky.Virgel Gess Other (See Comments)   Tamsulosin Other (See Comments)    Caused orthostatic hypotension per pt    Medications: I have reviewed the patient's current medications.  The PMH, PSH, Medications, Allergies, and SH were reviewed and updated.  ROS: Constitutional: Negative for fever, weight loss and weight gain. Cardiovascular: Negative for chest pain and dyspnea on exertion. Respiratory: Is not experiencing shortness of breath at rest. Gastrointestinal: Negative for nausea and vomiting. Neurological: Negative for headaches. Psychiatric: The patient is not nervous/anxious  Blood pressure 101/62, pulse 66, height 5\' 10"  (1.778 m), weight 151 lb (68.5 kg), SpO2 97%.  PHYSICAL EXAM:  Exam: General: Well-developed, well-nourished Communication and Voice: Raspy Respiratory Respiratory effort: Equal inspiration and expiration without stridor Cardiovascular Peripheral Vascular: Warm extremities with equal color/perfusion Eyes: No nystagmus with equal extraocular motion bilaterally Neuro/Psych/Balance: Patient oriented to person, place, and time; Appropriate mood and affect; Gait is intact with no imbalance; 4 Cranial nerves: Evidence of diffuse tongue fasciculations, left cranial nerve XII deficit, reduced soft palate movement on the left side likely due to fibrosis Head and Face Inspection: Normocephalic and atraumatic without mass or lesion Facial Strength: Facial motility symmetric and full bilaterally ENT Pinna: External ear intact and fully developed External canal: Canal is patent with intact skin Tympanic Membrane: Clear and mobile External Nose: No scar or anatomic deformity Internal Nose: Septum is  slightly deviated to the left. No polyp, or purulence. Mucosal edema and erythema present.  Bilateral inferior turbinate hypertrophy.  Lips, Teeth, and gums: Mucosa and teeth intact and viable TMJ: No pain to palpation with full mobility Oral cavity/oropharynx: No erythema or exudate, no lesions present Nasopharynx: No mass or lesion with intact mucosa.  Evidence of VPI with no palate closure particularly on the left side during scope exam Hypopharynx: pooling of secretions along the right piriform and bilateral tongue base vallecula Larynx Glottic: Left vocal fold paralysis without lesion or mass Supraglottic: Normal appearing epiglottis and AE folds Interarytenoid Space: Severe postcricoid edema and pachydermia -passed flexible endoscope through UES able to see upper esophagus without evidence of stricture no stricture immediately above UES Subglottic Space: Patent without lesion or edema Neck Neck and Trachea: Midline trachea without mass or lesion post XRT changes along left neck  Thyroid: No mass or nodularity Lymphatics: No lymphadenopathy  Procedure: Findings: was able to do evaluation of UES/upper esophagus and post-cricoid area in the office - no stricture there, has post-XRT changes and L VF paralysis, has VPI with incomplete soft palate closure on the left side reduced mobility of the soft palate likely sequela of XRT  Preoperative diagnosis: hx of left oropharyngeal cancer, severe dysphagia   Postoperative diagnosis:   Same  Procedure: Flexible fiberoptic laryngoscopy  Surgeon: Ashok Croon, MD  Anesthesia: Topical lidocaine and Afrin Complications: None Condition is stable throughout exam  Indications and consent:  The patient presents to the clinic with Indirect laryngoscopy view was incomplete. Thus it was recommended that they undergo a flexible fiberoptic laryngoscopy. All of the risks, benefits, and potential complications were reviewed with the patient  preoperatively and verbal informed consent was obtained.  Procedure: The patient was seated upright in the clinic. Topical lidocaine and Afrin were applied to the nasal cavity. After adequate anesthesia had occurred, I then proceeded to pass the flexible telescope into the nasal cavity. The nasal cavity was patent without rhinorrhea or polyp. The nasopharynx was also patent without mass or lesion. The base of tongue was visualized and was normal, there is significant pooling of secretions along the vallecula bilaterally and in right piriform sinus. There were no signs of pooling of secretions in the piriform sinuses. The true vocal folds had evidence of left vocal fold paralysis and bilateral vocal fold atrophy. There were no signs of glottic or supraglottic mucosal lesion or mass. There was severe interarytenoid pachydermia and post cricoid edema. The telescope was then slowly withdrawn and the patient tolerated the procedure throughout.  Studies Reviewed:CT chest 09/24/22 IMPRESSION: 1. Progressive diffuse peribronchovascular ground-glass, nodularity and consolidation with scattered septal thickening, findings which may be due to a worsening atypical/fungal infectious process. Lymphangitic carcinomatosis is not excluded. 2. Borderline enlarged mediastinal lymph nodes, possibly reactive. Recommend continued attention on follow-up as malignancy cannot be excluded. 3. Coronary artery calcification  MRI neck 09/15/22 MRI OF THE NECK WITH CONTRAST   TECHNIQUE: Multiplanar, multisequence MR imaging was performed following the administration of intravenous contrast.   CONTRAST:  7mL GADAVIST GADOBUTROL 1 MMOL/ML IV SOLN   COMPARISON:  PET-CT 09/04/2022, CT neck 03/13/2021   FINDINGS: Pharynx and larynx: The nasal cavity and nasopharynx are unremarkable.   There is no abnormality in the tongue to correspond to the area of ill-defined radiotracer uptake seen on the recent PET-CT. There is no  mass lesion or abnormal enhancement. There is no evidence of mass lesion or abnormal enhancement in the region of either tonsil. The oropharynx is unremarkable. Ill-defined T2 hyperintensity in the left parapharyngeal space with mild non masslike enhancement likely reflects posttreatment change, corresponding to ill-defined stranding seen on the CT neck going back to 2019.   The hypopharynx and larynx are unremarkable. The vocal folds are normal in appearance   There is no retropharyngeal fluid collection.  The airway is patent.   Salivary glands: The parotid and submandibular glands are unremarkable.   Thyroid: Unremarkable.   Lymph nodes: There is no pathologic lymphadenopathy in the neck.   Vascular: The major flow voids are normal.   Limited intracranial: Unremarkable.   Visualized orbits: Unremarkable.   Mastoids and visualized paranasal sinuses: There is mucosal thickening in the maxillary sinuses, left worse than right. There is a new right mastoid effusion.   Skeleton: There is multilevel degenerative change of the cervical spine with disc space narrowing most  advanced at C4-C5. There is moderate spinal canal stenosis at C2-C3 through C4-C5.   Upper chest: The imaged lung apices are grossly unremarkable.   Other: None.   IMPRESSION: 1. No evidence of recurrent disease or pathologic lymphadenopathy in the neck. Specifically, there is no suspicious finding in the tongue to correspond to the focus of ill-defined radiotracer uptake on the recent PET-CT. NIRADS 1. 2. Right mastoid effusion, nonspecific.  4//25/24 PET/CT  IMPRESSION: 1. Asymmetric focus of increased radiotracer uptake is identified within the left posterior tongue. Etiology indeterminate. Cannot exclude recurrent tumor. Consider further evaluation with direct visualization and/or contrast enhanced soft tissue neck CT or MRI. 2. No tracer avid cervical lymph nodes. 3. Heterogeneous, multifocal  asymmetric increased uptake is identified within the left lower lobe. On the corresponding nondiagnostic CT images there is extensive tree-in-bud nodularity, thickening of the peribronchovascular interstitium, mucoid impaction, and atelectasis/airspace consolidation. Imaging findings are more likely to reflect a inflammatory/infectious process. Suspect chronic recurrent aspiration. Advise clinical correlation for signs/symptoms aspiration. Additionally, follow-up imaging with diagnostic CT of the chest in 3 months is recommended to ensure resolution and to exclude a central obstructing lesion which could also account for these changes. 4. Mildly tracer avid lymph nodes within the mediastinum and left hilum. In the setting of inflammation/infection these are likely reactive. Metastatic adenopathy is considered less favored but cannot be excluded with a high degree of certainty in this patient who has a history of known malignancy. Attention to these lymph nodes on follow-up imaging is advised. 5. Diffuse tree-in-bud nodularity is also identified within the basilar right upper lobe, right middle lobe and right lower lobe. This is compatible with an inflammatory/infectious bronchiolitis and may be seen with recurrent aspiration. 6. Coronary artery calcifications. 7. Prostate gland enlargement. 8. Asymmetric opacification of the left maxillary sinus. Correlate for any clinical signs or symptoms of sinusitis.     Assessment/Plan: Encounter Diagnoses  Name Primary?   Aspiration into airway, initial encounter    History of radiation to head and neck region    History of oropharyngeal cancer    Vocal fold paralysis, left    Velopharyngeal insufficiency, acquired    Paralysis of left side of tongue    Oropharyngeal dysphagia Yes   62 year old male with history of cXRT for stage IVb left oropharyngeal cancer in 2017, who is here for initial consultation with me due to severe dysphagia  primarily in oropharynx and dysarthria for approximately 1 year and recurrent aspiration pneumonia.  Had total of 4 aspiration pneumonia episodes and was seen by pulmonary with recommendation to follow with SLP, and limit antibiotic use.  Repeat MBS in June 2024 with evidence of pharyngeal dysphagia and aspiration.  Underwent PEG placement, and working with speech, liquid only intake by mouth and boost supplementation via PEG.  He reports significant decline in speech and swallowing about a year ago, and prior to that had normal speech and was tolerating regular diet without issues.   On my exam today I was able to do evaluation of UES/upper esophagus and post-cricoid area in the office advancing flexible scope passed postcricoid area- no stricture seen.  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  There was significant pooling of secretions along the vallecula bilaterally.  There was evidence of left cranial nerve XII deficit, and diffuse tongue fasciculations.   I discussed  exam findings with the patient.  My main concern is due to acute worsening of both speech/tongue mobility and evidence of diffuse tongue fasciculations, neurologic condition such as ALS needs to be excluded.  Other neurologic processes such as those affecting bulbar function also have to be ruled out.  Patient's severe pharyngeal dysphagia could still be related to sequela of radiation, but acute changes in his swallowing function are certainly suspicious for another cause.   I advised the patient to see his neurologist Dr Kate Sable at [facility] in October as scheduled and will communicate with his neurologist to ensure above concerns are addressed during his follow-up  Will consider procedural interventions in the future based on neurology evaluation and for further workup.   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  He will return after follow-up with his neurologist.  Will do videostrobe when he returns to determine if left vocal fold paralysis results in severe glottic insufficiency.   Thank you for allowing me to participate in the care of this patient. Please do not hesitate to contact me with any questions or concerns.   Ashok Croon, MD [facility] Phone: [phone] Fax: [phone]    01/20/2023, 5:46 AM

## 2023-01-20 ENCOUNTER — Telehealth: Payer: Self-pay | Admitting: Dietician

## 2023-01-20 DIAGNOSIS — E46 Unspecified protein-calorie malnutrition: Secondary | ICD-10-CM | POA: Diagnosis not present

## 2023-01-20 NOTE — Telephone Encounter (Signed)
Received VM from pt wife regarding protein modular. Unable to reach wife of pt upon return call. Left VM with contact information.

## 2023-01-21 ENCOUNTER — Inpatient Hospital Stay: Payer: BC Managed Care – PPO | Attending: Hematology and Oncology

## 2023-01-21 VITALS — BP 132/88 | HR 72 | Temp 98.2°F | Resp 18

## 2023-01-21 DIAGNOSIS — Z85810 Personal history of malignant neoplasm of tongue: Secondary | ICD-10-CM | POA: Diagnosis not present

## 2023-01-21 DIAGNOSIS — R634 Abnormal weight loss: Secondary | ICD-10-CM | POA: Diagnosis not present

## 2023-01-21 DIAGNOSIS — C099 Malignant neoplasm of tonsil, unspecified: Secondary | ICD-10-CM

## 2023-01-21 MED ORDER — SODIUM CHLORIDE 0.9 % IV SOLN: Freq: Once | INTRAVENOUS | Status: AC

## 2023-01-22 DIAGNOSIS — Z85818 Personal history of malignant neoplasm of other sites of lip, oral cavity, and pharynx: Secondary | ICD-10-CM | POA: Diagnosis not present

## 2023-01-22 DIAGNOSIS — R634 Abnormal weight loss: Secondary | ICD-10-CM | POA: Diagnosis not present

## 2023-01-22 DIAGNOSIS — R1310 Dysphagia, unspecified: Secondary | ICD-10-CM | POA: Diagnosis not present

## 2023-01-22 DIAGNOSIS — Y842 Radiological procedure and radiotherapy as the cause of abnormal reaction of the patient, or of later complication, without mention of misadventure at the time of the procedure: Secondary | ICD-10-CM | POA: Diagnosis not present

## 2023-01-23 ENCOUNTER — Ambulatory Visit: Payer: BC Managed Care – PPO

## 2023-01-23 DIAGNOSIS — R1312 Dysphagia, oropharyngeal phase: Secondary | ICD-10-CM | POA: Diagnosis not present

## 2023-01-23 DIAGNOSIS — R471 Dysarthria and anarthria: Secondary | ICD-10-CM | POA: Diagnosis not present

## 2023-01-23 NOTE — Therapy (Signed)
OUTPATIENT SPEECH LANGUAGE PATHOLOGY SWALLOW TREATMENT   Patient Name: Todd [name] MRN: [id number] DOB:05-22-1960, 62 y.o., male Today's Date: 01/23/2023  PCP: Hiram Comber, MD REFERRING PROVIDER: Lonie Peak, MD  END OF SESSION:  End of Session - 01/23/23 1230     Visit Number 10    Number of Visits 17    Date for SLP Re-Evaluation 01/30/23    SLP Start Time 1020    SLP Stop Time  1100    SLP Time Calculation (min) 40 min    Activity Tolerance Patient tolerated treatment well                   Past Medical History:  Diagnosis Date   Acquired hypothyroidism    secondary to radiation //   followed by pcp   Arthritis    shoulders, neck, knees, feet   Bilateral sensorineural hearing loss    due to radiation   Decreased range of motion of neck    due to fibrotic changes of 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   Denervation atrophy of muscle    tongue ,  chronic ,  due to radiation injury   (neurologist-- dr aKate Sable ([facility])  EMG in care everywhere done 03-11-2022   Frequency of urination    GERD (gastroesophageal reflux disease)    History of cancer chemotherapy 2017   tonsil cancer   03-24-2016  to 05-06-2016   History of external beam radiation therapy    03-24-2016  to 05-15-2016  Left Tonsil and Bilateral Neck  @ [facility]   Hoarseness, chronic    due to radiation   Hyperlipidemia, mixed    Hypertension    Inguinal hernia, left    Lower facial weakness    intermittant left lower facial weakness   Numbness and tingling in left arm    intermittently per pt   Oropharyngeal dysphagia    hx neck radiation for left tonsil cancer ;  Speech therapy, regular diet with precautions swallows okay   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   followed by ENT dr bates/  oncologist--- dr Bertis Ruddy;  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;  completed chemo 05-06-2016 and completed radiation of left tonsil/ bilateral neck 05-15-2016  w/ radiation injury to  tongue (denervation chronic)   Wears glasses    Wears hearing aid in both ears    Past Surgical History:  Procedure Laterality Date   INGUINAL HERNIA REPAIR Left 05/02/2022   Procedure: OPEN LEFT INGUINAL HERNIA REPAIR WITH MESH;  Surgeon: Berna Bue, MD;  Location: [facility];  Service: General;  Laterality: Left;   IR GASTROSTOMY TUBE MOD SED  11/07/2022   IR GASTROSTOMY TUBE REMOVAL  01/30/2017   IR GENERIC HISTORICAL  03/20/2016   IR US GUIDE VASC ACCESS RIGHT 03/20/2016 Simonne Come, MD [facility]   IR GENERIC HISTORICAL  03/20/2016   IR FLUORO GUIDE PORT INSERTION RIGHT 03/20/2016 Simonne Come, MD [facility]   IR GENERIC HISTORICAL  03/20/2016   IR GASTROSTOMY TUBE MOD SED 03/20/2016 Simonne Come, MD [facility]   IR REMOVAL TUN ACCESS W/ PORT W/O FL MOD SED  01/30/2017   Patient Active Problem List   Diagnosis Date Noted   Malignant neoplasm of tonsillar fossa (HCC) 01/07/2023   Pulmonary infiltrates 09/08/2022   Hypothyroidism    Right hip pain 12/06/2018   CKD (chronic kidney disease), stage III (HCC) 11/30/2017   Anemia, chronic disease 11/30/2017   Multiple lung  nodules on CT 09/08/2016   Pancytopenia, acquired (HCC) 05/26/2016   Deficiency anemia 05/26/2016   Splenic lesion 05/16/2016   Jaw pain 04/08/2016   Essential hypertension 03/18/2016   Weight loss 02/26/2016   Tonsil cancer (HCC) 02/21/2016    ONSET DATE: 2018   REFERRING DIAG: C09.9 (ICD-10-CM) - Tonsil cancer   THERAPY DIAG:  Dysphagia, oropharyngeal phase  Dysarthria and anarthria  Rationale for Evaluation and Treatment: Rehabilitation  SUBJECTIVE:   SUBJECTIVE STATEMENT: No overt s/sx aspiration PNA today. Pt performed oral care prior to session. Pt accompanied by: significant other  PERTINENT HISTORY: From Dr. Basilio Cairo note May 2024: The patient returns today for follow-up after he approached me with new symptoms at our head and neck annual survivorship celebration.  Mr.  Hergenrader presents for follow up of radiation completed 05/15/16 to his Tonsillar fossa/oropharynx.  Recent PET and MRI below were reviewed at tumor board, NED.  See below. 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  He is also losing weight.  PAIN:  Are you having pain? Yes: NPRS scale: 4/10 Pain location: cervical neck down to mid-back Pain description: annoying pain, makes it hard to sleep Aggravating factors: certain movement Relieving factors: meds  FALLS: Has patient fallen in last 6 months?  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  Had total of 4 aspiration pneumonia episodes and was seen by pulmonary with recommendation to follow with SLP, and limit antibiotic use.  Repeat MBS in June 2024 with evidence of pharyngeal dysphagia and aspiration.  Underwent PEG placement, and working with speech, liquid only intake by mouth and boost supplementation via PEG.  He reports significant decline in speech and swallowing about a year ago, and prior to that had normal speech and was tolerating regular diet without issues.  On my exam today I was able to do evaluation of UES/upper esophagus and post-cricoid area in the office advancing flexible scope passed postcricoid area- no stricture seen.  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  There was significant pooling of secretions along the vallecula bilaterally.  There was evidence of left cranial nerve XII deficit, and diffuse tongue fasciculations.  I discussed exam findings with the patient.  My main concern is due to acute worsening of both speech/tongue mobility and evidence of diffuse tongue fasciculations, neurologic condition such as ALS needs to be excluded.  Other neurologic processes such as those affecting bulbar function also have to be ruled out.  Patient's severe pharyngeal dysphagia could still be related to sequela of radiation, but acute changes in his swallowing function are certainly suspicious for another cause.  I advised the patient to see his neurologist Dr Kate Sable at [facility] in October as scheduled and will communicate with his neurologist to ensure  above concerns are addressed during his follow-up Will consider procedural interventions in the future based on neurology evaluation and for further workup.  Specifically will consider management of VPI and glottic insufficiency   Patient will follow-up with his SLP and continue swallow therapy and aspiration precautions including good oral hygiene and strategies to minimize aspiration.  He will return after follow-up with his neurologist.  Will do videostrobe when he returns to determine if left vocal fold paralysis results in severe glottic insufficiency.    RESULTS FROM OBJECTIVE SWALLOSW STUDY (MBS)-  10/30/22:  Clinical Impression: Patient presents with mild oral and severe pharyngo-cervical esophageal dysphagia presumed due to iatrogenic effects of XRT that is significantly worse compared to prior MBS conducted by this SLP in 2018. SLP reviewed [facility] from [facility] in 2023/11 but could not view images. Hypoglossal nerve deficit with impaired left lingual motility  and presence of fasciulations impacts oral control/strength. Head tilt to the right attempted to aid oral transiting/control with partial effectiveness. Pharyngeal swallow marked by sevfere hypomotility resulting in minimal transiting of barium into esophagus and laryngeal penetration/aspiration. Mulitple dry swallows *pt initiated* is helpful to decrease amount of retention but does not fuly clear it, even after 5-6 swallows with small single liquid bolus. Head of chair reclined to approx 45* with slight neck extension did decrease amount of penetration and aspiration as gravity faciliated retention of barium in posterior pharynx. Even in posterior position and with use of right tongue for oral transiting, pt penetrated very small bite of masticated cracker after the swallow as pharyngeal retention spilled into open airway. Cued cough effectively cleared cracker into pharynx for pt to swallow with liquids. Clearance into cervical esophagus was significantly impaired with gross retention of liquids as pyriform sinus. Question if he may benefit from intervention for PES opening to mitigate his dysphagia - advised he speak to Dr Jenne Pane, ENT, re: potential.   Unfortunately [facility] current dysphagia is profound and given progressive deficit (presumed from XRT effects, fibrosis) for 15 months, prognosis for swallow to return to functional level is guarded at best.    Advised pt consider ordering antichoking device for emergent use.  Recommended diet:PO diet PO Diet Recommendation: Thin liquids (Level 0); Mildly thick liquids (Level 2, nectar thick); Full liquid diet, some soft solids Liquid Administration via: Cup; Straw; Spoon Medication Administration: Other (Comment) (consider suspension). Supervision: Patient able to self-feed Recommended compensations:  Swallowing strategies  : effortful swallow; Multiple dry swallows after each bite/sip; Hard cough after swallowing; Head tilt right during swallowing;  Slow rate; Small bites/sips (Recline approx 45* with slight chin elevation), Follow solids with liquids Postural changes: Partially reclined for meals Oral care recommendations: Oral care QID (4x/day) Recommended consults: Consider ENT consultation (? ENT to determine if potential PES treatment may be helpful)   PATIENT REPORTED OUTCOME MEASURES (PROM): EAT-10: Pt returned 12/05/22 and scored 36/40 (lower scores = better QOL)   TODAY'S TREATMENT:  DATE:  01/23/23: Pt saw Dr. Irene Pap 01/19/23: Assessment/plan above under "Objective findings". SLP discussed that pt should perform HEP until mid-October. No plan to have follow up MBS at this time due to pt performance in ST sessions relatively unchanged in last 2-3 sessions. With POs (wet puree and water), wet voice intermittently, pt using safe swallow compensations with success. No overt s/sx aspiration PNA at this time. SLP told pt to cont with current solid consistency - said could have puree soups (homogenous consistency) and broths - teaspoons.  Pt completed HEP with independence. Pt agrees with SLP his condition is currently stable and will cont to be seen every other week for two months. SLP questions if pt's swallowing will improve with improved velopharyngeal closure, and the role possible glottic insufficiency may be playing in efficacy/efficiency of pt's swallow.  01/16/23: Pt to see Dr. Irene Pap 01/19/23. Pt performed oral care prior to ST today. SLP plan is to have pt perform dysphagia HEP for approx 10 weeks prior to [facility] (which will be end of September). Pt appears to cont to gain swallow strength week after week. With POs, >75% of the time Amichai had audible swallow, possibly insufficient velopharyngeal closure, as pt again (as in previous session) sniffed after swallowing water. To this point, pt has remained  free of overt s/sx aspiration PNA and has followed safe swallow strategies in sessions, sometimes with rare initial reminder to cough/clear throat after 2nd or 3rd swallow with each bolus. Wet voice heard approx 50% of the time with yogurt and water, mostly cleared with multiple swallows. 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  Pt was independent with HEP.   01/08/23: SLP  educated pt/wife rationale for waiting to schedule MBS/FEES until mid-September or later will give Czeslaw ample time for strength gain in swallowing musculature (at least 8 weeks). No overt s/sx aspiration PNA this session; pt ate small spoons of baby food yams, and drank water. SLP had to provide initial cue to cough after a bite of yams (pt was inconsistent) but then Rochelle cont'd with consistent coughing after last swallow of bolus. PT to cont with solid POs and liquid POs at home. Appointment with Dr. Irene Pap is 01/19/23. Min A with HEP for tongue protrusion on Masako. Will keep pt once/week to monitor for s/sx PNA and to possibly upgrade pt to regular pureed (instead of baby food) after assessment in-session.  01/01/23: Pt reports difficult to get trigger on first 1-3 reps of Masako and supraglottic but once he passes these number of reps - no difficulty with triggering swallow. No problem at all triggering effortful swallow from rep #1 on. Today pt did not have difficulty with triggering swallows with Masako. Although lingual protrusion was adequate, SLP told pt to protrude tongue a bit further for the first 2 reps and he could do so. SLP told pt protruding tongue to that length was preferable to the length he currently uses, so he should try to have more reps with further protrusion. Last session, SLP added dynamic Shaker to pt's regimen, 30 1-second reps at least BID. With POs, pt made through one 4 oz jar in one week. SLP suggested pt use drinkable yogurt, Fair life milk, or Boost as  POs as well, as long as pt is using safe swallow techniques from [facility] 10/30/22. Pt req'd cues for a cough after every bite/sip, and SLP provided rationale and reminder that pt's sensation was not  100% when material was penetrated.  12/26/22: No s/sx any aspiration PNA today, nor in this last week at all. Pt cont to perform PO trials with water at home, but coughs with every 3-4th swallow. SLP strongly urged pt to complete cough every swallow based upon MBS results. Pt followed other precautions with independence. SLP trialed baby food applesauce with pt with liquid wash and completed with success. SLP told pt to try 10 boluses as many times/day as desired but always stopping if he feels he has less than 8/10 stamina (10=WNL), and always perform thorough oral care before and after POs. SLP also suggested pt could try baby food applesauce-consistency pudding or yogurt. Pt completed HEP today independently. Due to initiating puree today pt will keep frequency at once/week.   12/19/22: No s/sx any aspiration PNA today, nor in this last week at all. Performed oral care just prior to appointment today. Pt independent with HEP today. Protruded tongue past lips independently. Pt cont to take one bottle of water per day, multiple (x2-3) sips per bolus with multiple hard swallows and cough if necessary. Instead, SLP told pt to take one sip with two-three hard swallows, cough, reswallow hard. SLP to attempt smooth purees with pt next session -will have been 4 weeks since initiation of HEP and pt without any overt s/sx aspiration PNA at this time.  12/12/22: Pt performed oral care prior to ST. Long discussion about whether or not BaSwallow exam would be safe for pt at this time. Afterwards, SLP emailed SLP who performed MBS with pt and she stated BaSwallow exam might be more difficult to perform safely at this time. Pt performed HEP independently and used safe swallow techniques with sips of water.   12/05/22: Pt independent  with HEP today, will use mirror for Masako until extent of tongue protrusion habituated. Pt demo'd strategies with sip water today, independently. Pt and SLP agree pt can reduce to once/week. SLP provided s/sx aspiration PNA and pt with mod I.  12/03/22: Pt has been performing HEP regularly. Dora Sims he performed exercises with initial mod cue for Masako (tongue protrusion).  He drank water without overt s/sx aspiration x3 using safe swallow strategies independently.  11/24/22:  Today SLP explained results of pt's MBS and the rationale for the safe swallow strategies. Pt demonstrated understanding of each and reports he has been using these with one bottle of water each day. No overt s/sx of aspiration PNA to date with >1 week of PO water. Pt has NOT been performing oral care prior to POs. SLP educated pt re: swallow HEP procedure. Pt performed each exercise and SLP ensured correctness prior to moving to next exercise. Pt req'd min A with strength of cough with supraglottic.  PATIENT EDUCATION: Education details: see "today's treatment" Person educated: Patient and Spouse Education method: Explanation, Demonstration, Verbal cues, and Handouts Education comprehension: verbalized understanding, returned demonstration, verbal cues required, and needs further education   ASSESSMENT:  CLINICAL IMPRESSION: Added LTG #6. Patient is a 62 y.o. M who was seen today for treatment of swallowing. SLP reviewed pt's swallowing HEP and ensured pt is safe and correct with safe swallow strategies. Please see "today's treatment" for more details. It should be noted that pt's prognosis after June 2024 MBS, improvement for a functional swallow was GUARDED. SLP told pt today pt's swallow does not appear unchanged in last 2-3 sessions and thus MBS may not provide much more definitive information at this time and pt should cont with HEP for  4 more weeks to maximize strength improvement - then maintain strength with x3/week  completion. MBS to be suggested as clinically appropriate.  OBJECTIVE IMPAIRMENTS: include dysarthria and dysphagia. These impairments are limiting patient from ADLs/IADLs, effectively communicating at home and in community, and safety when swallowing. Factors affecting potential to achieve goals and functional outcome are co-morbidities and severity of impairments. Patient will benefit from skilled SLP services to address above impairments and improve overall function.  REHAB POTENTIAL: Fair given prognosis from MBS   GOALS: Goals reviewed with patient? Yes  SHORT TERM GOALS: Target date: 12/25/22  Pt will perform HEP with rare min A in two sessions Baseline:12/03/22 Goal status: Met  2.  Pt will follow prescribed safe swallow strategies in 3 sessions Baseline: 12/03/22, 12/12/22 Goal status: Partially met  3.  Pt will perform HEP 6/7 days a week for two weeks, per HEP monitoring method Baseline:  Goal status: MET  4.  Pt and/or wife will ID 3 overt s/sx aspiration PNA with modified independence Baseline:  Goal status: met   LONG TERM GOALS: Target date: 01/30/23  Pt will perform HEP with rare min A in two sessions Baseline: 12/19/22 Goal status: Met  2.  Pt will follow prescribed safe swallow strategies in 3 sessions, after 12/25/22 Baseline: 01/08/23, 01/16/23 Goal status: met  3.  Pt will undergo follow up MBS when/if clinically indicated Baseline:  Goal status: INITIAL  4.  Pt will score higher on PROM than initial score Baseline:  Goal status: INITIAL  5.   Pt will perform HEP independently in three sessions Baseline: 01/16/23, 01/23/23 Goal status: Met  6.   Pt will remain free of overt s/sx aspiration PNA over three sessions Baseline: Goal Status: Initial    PLAN:  SLP FREQUENCY: 1x/week  SLP DURATION: 8 weeks  PLANNED INTERVENTIONS: Aspiration precaution training, Pharyngeal strengthening exercises, Diet toleration management , Environmental controls, Trials  of upgraded texture/liquids, Cueing hierachy, Internal/external aids, SLP instruction and feedback, Compensatory strategies, Patient/family education, and Repeat MBS PRN    Zanovia Rotz, CCC-SLP 01/23/2023, 12:31 PM

## 2023-01-28 ENCOUNTER — Inpatient Hospital Stay: Payer: BC Managed Care – PPO

## 2023-01-28 ENCOUNTER — Inpatient Hospital Stay: Payer: BC Managed Care – PPO | Admitting: Dietician

## 2023-01-28 VITALS — BP 122/87 | HR 62 | Temp 97.7°F | Resp 18 | Wt 152.2 lb

## 2023-01-28 DIAGNOSIS — E039 Hypothyroidism, unspecified: Secondary | ICD-10-CM | POA: Diagnosis not present

## 2023-01-28 DIAGNOSIS — R634 Abnormal weight loss: Secondary | ICD-10-CM | POA: Diagnosis not present

## 2023-01-28 DIAGNOSIS — Z1212 Encounter for screening for malignant neoplasm of rectum: Secondary | ICD-10-CM | POA: Diagnosis not present

## 2023-01-28 DIAGNOSIS — C099 Malignant neoplasm of tonsil, unspecified: Secondary | ICD-10-CM

## 2023-01-28 DIAGNOSIS — R7301 Impaired fasting glucose: Secondary | ICD-10-CM | POA: Diagnosis not present

## 2023-01-28 DIAGNOSIS — N401 Enlarged prostate with lower urinary tract symptoms: Secondary | ICD-10-CM | POA: Diagnosis not present

## 2023-01-28 DIAGNOSIS — I129 Hypertensive chronic kidney disease with stage 1 through stage 4 chronic kidney disease, or unspecified chronic kidney disease: Secondary | ICD-10-CM | POA: Diagnosis not present

## 2023-01-28 DIAGNOSIS — Z85810 Personal history of malignant neoplasm of tongue: Secondary | ICD-10-CM | POA: Diagnosis not present

## 2023-01-28 MED ORDER — PROSOURCE NO CARB PO LIQD: 30.0000 mL | Freq: Every day | ORAL | Status: DC

## 2023-01-28 MED ORDER — SODIUM CHLORIDE 0.9 % IV SOLN: Freq: Once | INTRAVENOUS | Status: AC

## 2023-01-28 NOTE — Patient Instructions (Signed)

## 2023-01-28 NOTE — Progress Notes (Signed)
Nutrition Follow-up:   Patient completed concurrent chemoradiation for tonsil cancer. Final radiation 05/15/16 under the care of Dr. Basilio Cairo. Patient now with dysphagia and recurrent aspiration pneumonia. Pt currently receiving supportive care with IV fluids.   Met with pt and wife in infusion. Pt excited to share wt gain with RD today. He reports improved energy and interest in doing things outside the house. Pt is wanting to go to the community gym in the neighborhood. Pt is followed closely by SLP. Pt is tolerating drinkable yogurt, baby food, broth by mouth. Diet advanced at last appointment. He is looking forward to making shakes. Pt asking if he can add Boost VHC to shake.    Medications: reviewed   Labs: no recent labs for review  Anthropometrics: Wt 152 lb 4 oz today - trending up  8/28 - 147 lb 3 oz 6/28 - 148 lb   Estimated Energy Needs  Kcals: 8413-2440 (40-45 kcal/kg) Protein: 103-116 (1.6-1.8 g/kg) Fluid: >/= 2.2 L  NUTRITION DIAGNOSIS: Inadequate oral intake continues - addressing with TF     MALNUTRITION DIAGNOSIS: Severe malnutrition continues - addressing with TF  INTERVENTION:  Continue one carton Boost VHC via tube 5x/day - pt may use one Boost blended in shake vs bolus Continue 30 ml ProSource once/day (60 kcal, 15g) - will place orders  Continue weekly weights at home     MONITORING, EVALUATION, GOAL: wt trends, TF, diet advancement per SLP   NEXT VISIT: Tuesday October 1 during infusion

## 2023-01-30 ENCOUNTER — Telehealth: Payer: Self-pay | Admitting: *Deleted

## 2023-01-30 ENCOUNTER — Ambulatory Visit: Payer: BC Managed Care – PPO

## 2023-01-30 NOTE — Telephone Encounter (Signed)
Ms. Golliher called - patient has felt better after IV fluids weekly for past 2 weeks. Scheduled for 2 more IV fluid infusions in next 2 weeks. Next fluid infusion 9/24. She didn't think he would need any more of those. She wants to know if they will be seeing Dr. Bertis Ruddy for a follow up.  She also asked for a referral for patient to see Dr. Barbaraann Cao as he was recommended to them since he's a neuro-oncologist and "Aurelius needs a 2nd opinion on his tongue problem.  Dr. Bertis Ruddy informed of Ms. Stineman's concerns and questions.  Contacted Ms. Boehle with Dr. Maxine Glenn response- Since he was scanned and ruled out cancer recurrence, he has not received treatment so does not require follow up care. Problems with his tongue would not be an indication to see a neuro-oncologist. He should be evaluated by his PCP and follow their recommendations/referrals.   Ms. Tara stated patient is seeing PCP on Monday and they will discuss concerns with them at that time. She said patient has seen an ENT and a neurologist regarding his involuntary tongue movement, and will see neurologist again as it may be related to cranial nerve damage during radiation. She reports patient's weight and strength is increasing, so they feel they are seeing progress. She thanked this caller for sharing Dr. Maxine Glenn response.

## 2023-02-02 ENCOUNTER — Encounter: Payer: Self-pay | Admitting: Hematology and Oncology

## 2023-02-02 DIAGNOSIS — C099 Malignant neoplasm of tonsil, unspecified: Secondary | ICD-10-CM | POA: Diagnosis not present

## 2023-02-02 DIAGNOSIS — R82998 Other abnormal findings in urine: Secondary | ICD-10-CM | POA: Diagnosis not present

## 2023-02-02 DIAGNOSIS — Z1339 Encounter for screening examination for other mental health and behavioral disorders: Secondary | ICD-10-CM | POA: Diagnosis not present

## 2023-02-02 DIAGNOSIS — Z0000 Encounter for general adult medical examination without abnormal findings: Secondary | ICD-10-CM | POA: Diagnosis not present

## 2023-02-02 DIAGNOSIS — Z1331 Encounter for screening for depression: Secondary | ICD-10-CM | POA: Diagnosis not present

## 2023-02-02 LAB — LAB REPORT - SCANNED
A1c: 5.7
Albumin, Urine POC: 10.5
Creatinine, POC: 122.8 mg/dL
EGFR: 61.3
Microalb Creat Ratio: 9

## 2023-02-03 ENCOUNTER — Inpatient Hospital Stay: Payer: BC Managed Care – PPO

## 2023-02-03 VITALS — BP 154/94 | HR 71 | Temp 97.5°F | Resp 16 | Wt 152.0 lb

## 2023-02-03 DIAGNOSIS — R634 Abnormal weight loss: Secondary | ICD-10-CM | POA: Diagnosis not present

## 2023-02-03 DIAGNOSIS — Z85810 Personal history of malignant neoplasm of tongue: Secondary | ICD-10-CM | POA: Diagnosis not present

## 2023-02-03 DIAGNOSIS — C099 Malignant neoplasm of tonsil, unspecified: Secondary | ICD-10-CM

## 2023-02-03 MED ORDER — SODIUM CHLORIDE 0.9 % IV SOLN: Freq: Once | INTRAVENOUS | Status: AC

## 2023-02-03 NOTE — Patient Instructions (Signed)

## 2023-02-06 ENCOUNTER — Ambulatory Visit: Payer: BC Managed Care – PPO

## 2023-02-09 DIAGNOSIS — M530 Cervicocranial syndrome: Secondary | ICD-10-CM | POA: Diagnosis not present

## 2023-02-09 DIAGNOSIS — M9901 Segmental and somatic dysfunction of cervical region: Secondary | ICD-10-CM | POA: Diagnosis not present

## 2023-02-10 ENCOUNTER — Inpatient Hospital Stay: Payer: BC Managed Care – PPO | Admitting: Dietician

## 2023-02-10 ENCOUNTER — Inpatient Hospital Stay: Payer: BC Managed Care – PPO | Attending: Hematology and Oncology

## 2023-02-10 VITALS — BP 177/99 | HR 75 | Temp 97.7°F | Resp 18 | Wt 153.0 lb

## 2023-02-10 DIAGNOSIS — R634 Abnormal weight loss: Secondary | ICD-10-CM | POA: Diagnosis not present

## 2023-02-10 DIAGNOSIS — C099 Malignant neoplasm of tonsil, unspecified: Secondary | ICD-10-CM

## 2023-02-10 DIAGNOSIS — Z85810 Personal history of malignant neoplasm of tongue: Secondary | ICD-10-CM | POA: Insufficient documentation

## 2023-02-10 MED ORDER — SODIUM CHLORIDE 0.9 % IV SOLN: Freq: Once | INTRAVENOUS | Status: AC

## 2023-02-10 NOTE — Progress Notes (Signed)
Nutrition Follow-up:  Patient completed concurrent chemoradiation for tonsil cancer. Final radiation 05/15/16 under the care of Dr. Basilio Cairo. Patient now with dysphagia and recurrent aspiration pneumonia. Pt currently receiving supportive care with IV fluids.   Met with patient and wife in infusion. He reports trying a vanilla milkshake from [employer] yesterday. Patient reports tolerating this without difficulty. Patient continues to follow with SLP biweekly. He is completing HEP as prescribed. Weight is slowly trending up. He is tolerating tube feedings at goal.   Medications: reviewed  Labs: reviewed   Anthropometrics: Wt 153 lb today - trending up  9/18 - 152 lb 4 oz  8/28 - 147 lb 3 oz  6/28 - 148 lb    Estimated Energy Needs  Kcals: 2576-2898 Protein: 103-116 Fluid: >/= 2.2 L  NUTRITION DIAGNOSIS: Inadequate oral intake continues - addressing with TF   MALNUTRITION DIAGNOSIS: Severe malnutrition continues - slowly improving   INTERVENTION:  Continue one carton Boost VHC via tube Continue 30 ml ProSource once/day Diet advancement per SLP    MONITORING, EVALUATION, GOAL: wt trends, TF, oral intake, diet advancement   NEXT VISIT: Wednesday October 30 in office

## 2023-02-12 DIAGNOSIS — Z85818 Personal history of malignant neoplasm of other sites of lip, oral cavity, and pharynx: Secondary | ICD-10-CM | POA: Diagnosis not present

## 2023-02-12 DIAGNOSIS — R634 Abnormal weight loss: Secondary | ICD-10-CM | POA: Diagnosis not present

## 2023-02-12 DIAGNOSIS — R1310 Dysphagia, unspecified: Secondary | ICD-10-CM | POA: Diagnosis not present

## 2023-02-12 DIAGNOSIS — Y842 Radiological procedure and radiotherapy as the cause of abnormal reaction of the patient, or of later complication, without mention of misadventure at the time of the procedure: Secondary | ICD-10-CM | POA: Diagnosis not present

## 2023-02-20 ENCOUNTER — Ambulatory Visit: Payer: BC Managed Care – PPO | Attending: Radiation Oncology

## 2023-02-20 DIAGNOSIS — R1312 Dysphagia, oropharyngeal phase: Secondary | ICD-10-CM | POA: Diagnosis not present

## 2023-02-20 DIAGNOSIS — R471 Dysarthria and anarthria: Secondary | ICD-10-CM

## 2023-02-20 NOTE — Therapy (Signed)
OUTPATIENT SPEECH LANGUAGE PATHOLOGY SWALLOW TREATMENT/ RECERTIFICATION   Patient Name: Todd [name] MRN: [id number] DOB:Jan 25, 1961, 62 y.o., male Today's Date: 02/20/2023  PCP: Hiram Comber, MD REFERRING PROVIDER: Lonie Peak, MD  END OF SESSION:  End of Session - 02/20/23 1208     Visit Number 11    Number of Visits 17    Date for SLP Re-Evaluation 05/21/23    SLP Start Time 1019    SLP Stop Time  1108    SLP Time Calculation (min) 49 min    Activity Tolerance Patient tolerated treatment well                    Past Medical History:  Diagnosis Date   Acquired hypothyroidism    secondary to radiation //   followed by pcp   Arthritis    shoulders, neck, knees, feet   Bilateral sensorineural hearing loss    due to radiation   Decreased range of motion of neck    due to fibrotic changes of 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   Denervation atrophy of muscle    tongue ,  chronic ,  due to radiation injury   (neurologist-- dr aKate Sable ([facility])  EMG in care everywhere done 03-11-2022   Frequency of urination    GERD (gastroesophageal reflux disease)    History of cancer chemotherapy 2017   tonsil cancer   03-24-2016  to 05-06-2016   History of external beam radiation therapy    03-24-2016  to 05-15-2016  Left Tonsil and Bilateral Neck  @ [facility]   Hoarseness, chronic    due to radiation   Hyperlipidemia, mixed    Hypertension    Inguinal hernia, left    Lower facial weakness    intermittant left lower facial weakness   Numbness and tingling in left arm    intermittently per pt   Oropharyngeal dysphagia    hx neck radiation for left tonsil cancer ;  Speech therapy, regular diet with precautions swallows okay   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   followed by ENT dr bates/  oncologist--- dr Bertis Ruddy;  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;  completed chemo 05-06-2016 and completed radiation of left tonsil/ bilateral neck 05-15-2016  w/  radiation injury to tongue (denervation chronic)   Wears glasses    Wears hearing aid in both ears    Past Surgical History:  Procedure Laterality Date   INGUINAL HERNIA REPAIR Left 05/02/2022   Procedure: OPEN LEFT INGUINAL HERNIA REPAIR WITH MESH;  Surgeon: Berna Bue, MD;  Location: [facility] [place];  Service: General;  Laterality: Left;   IR GASTROSTOMY TUBE MOD SED  11/07/2022   IR GASTROSTOMY TUBE REMOVAL  01/30/2017   IR GENERIC HISTORICAL  03/20/2016   IR US GUIDE VASC ACCESS RIGHT 03/20/2016 Simonne Come, MD [facility]   IR GENERIC HISTORICAL  03/20/2016   IR FLUORO GUIDE PORT INSERTION RIGHT 03/20/2016 Simonne Come, MD [facility]   IR GENERIC HISTORICAL  03/20/2016   IR GASTROSTOMY TUBE MOD SED 03/20/2016 Simonne Come, MD [facility]   IR REMOVAL TUN ACCESS W/ PORT W/O FL MOD SED  01/30/2017   Patient Active Problem List   Diagnosis Date Noted   Malignant neoplasm of tonsillar fossa (HCC) 01/07/2023   Pulmonary infiltrates 09/08/2022   Hypothyroidism    Right hip pain 12/06/2018   CKD (chronic kidney disease), stage III (HCC) 11/30/2017   Anemia, chronic disease 11/30/2017  Multiple lung nodules on CT 09/08/2016   Pancytopenia, acquired (HCC) 05/26/2016   Deficiency anemia 05/26/2016   Splenic lesion 05/16/2016   Jaw pain 04/08/2016   Essential hypertension 03/18/2016   Weight loss 02/26/2016   Tonsil cancer (HCC) 02/21/2016   Speech Therapy Progress Note  Dates of Reporting Period: 11/24/22  to present  Subjective Statement: Pt has been seen for 11 ST visits focusing on swallowing, Today pt asked for exercises to improve his articulation/intelligibility.  Objective: Pt is now drinking liquids, without overt s/sx aspiration PNA in approx 5 months. He is still PEG tube dependent. See below for more details.   Goal Update: See below  Plan: See below. Suspect 1-2 more visits.  Reason Skilled Services are Required: Ensure pt is safe with POs.      ONSET DATE: 2018   REFERRING DIAG: C09.9 (ICD-10-CM) - Tonsil cancer   THERAPY DIAG:  Dysphagia, oropharyngeal phase  Dysarthria and anarthria  Rationale for Evaluation and Treatment: Rehabilitation  SUBJECTIVE:   SUBJECTIVE STATEMENT: No overt s/sx aspiration PNA today. Pt performed oral care prior to session.  Pt accompanied by: significant other  PERTINENT HISTORY: From Dr. Basilio Cairo note May 2024: The patient returns today for follow-up after he approached me with new symptoms at our head and neck annual survivorship celebration.  Mr. Michonski presents for follow up of radiation completed 05/15/16 to his Tonsillar fossa/oropharynx.  Recent PET and MRI below were reviewed at tumor board, NED.  See below. 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  He is also losing weight.  PAIN:  Are you having pain? Yes: NPRS scale: 3/10 Pain location: cervical neck down to mid-back Pain description: annoying pain, makes it hard to sleep Aggravating factors: certain movement Relieving factors: meds  FALLS: Has patient fallen in last 6 months?  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  Had total of 4 aspiration pneumonia episodes and was seen by pulmonary with recommendation to follow with SLP, and limit antibiotic use.  Repeat MBS in June  2024 with evidence of pharyngeal dysphagia and aspiration.  Underwent PEG placement, and working with speech, liquid only intake by mouth and boost supplementation via PEG.  He reports significant decline in speech and swallowing about a year ago, and prior to that had normal speech and was tolerating regular diet without issues.  On my exam today I was able to do evaluation of UES/upper esophagus and post-cricoid area in the office advancing flexible scope passed postcricoid area- no stricture seen.  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  There was significant pooling of secretions along the vallecula bilaterally.  There was evidence of left cranial nerve XII deficit, and diffuse tongue fasciculations.  I discussed exam findings with the patient.  My main concern is due to acute worsening of both speech/tongue mobility and evidence of diffuse tongue fasciculations, neurologic condition such as ALS needs to be excluded.  Other neurologic processes such as those affecting bulbar function also have to be ruled out.  Patient's severe pharyngeal dysphagia could still be related to sequela of radiation, but acute changes in his swallowing function are certainly suspicious for another cause.  I advised the patient to see his neurologist Dr Kate Sable at [facility] in October as scheduled and will communicate with his neurologist to ensure above concerns are addressed during his follow-up Will consider procedural interventions in the future based on neurology evaluation and for further workup.  Specifically will consider management of VPI and glottic insufficiency   Patient will follow-up with his SLP and continue swallow therapy and aspiration precautions including good oral hygiene and strategies to minimize aspiration.  He will return after follow-up with his  neurologist.  Will do videostrobe when he returns to determine if left vocal fold paralysis results in severe glottic insufficiency.    RESULTS FROM OBJECTIVE SWALLOSW STUDY (MBS)-  10/30/22:  Clinical Impression: Patient presents with mild oral and severe pharyngo-cervical esophageal dysphagia presumed due to iatrogenic effects of XRT that is significantly worse compared to prior MBS conducted by this SLP in 2018. SLP reviewed note from [facility] at [facility] in 2023/11 but could not view images. 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 Head tilt to the right attempted to aid oral transiting/control with partial effectiveness. Pharyngeal swallow marked by sevfere hypomotility resulting in minimal transiting of barium into esophagus and laryngeal penetration/aspiration. Mulitple dry swallows *pt initiated* is helpful to decrease amount of retention but does not fuly clear it, even after 5-6 swallows with small single liquid bolus. Head of chair reclined to approx 45* with slight neck extension did decrease amount of penetration and aspiration as gravity faciliated retention of barium in posterior pharynx. Even in posterior position and with use of right tongue for oral transiting, pt penetrated very small bite of masticated cracker after the swallow as pharyngeal retention spilled into open airway. Cued cough effectively cleared cracker into pharynx for pt to swallow with liquids. Clearance into cervical esophagus was significantly impaired with gross retention of liquids as pyriform sinus. Question if he may benefit from intervention for PES opening to mitigate his dysphagia - advised he speak to Dr Jenne Pane, ENT, re: potential.   Unfortunately Mr [facility] current dysphagia is profound and given progressive deficit (presumed from XRT effects, fibrosis) for 15 months, prognosis for swallow to return to functional level is guarded at best.     Advised pt consider ordering antichoking device for emergent use.  Recommended diet:PO diet PO Diet Recommendation: Thin liquids (Level 0); Mildly thick liquids (Level 2, nectar thick); Full liquid diet, some soft solids Liquid Administration via: Cup; Straw; Spoon Medication Administration: Other (Comment) (consider suspension). Supervision: Patient able to self-feed Recommended compensations:  Swallowing strategies  : effortful swallow; Multiple dry swallows after each bite/sip; Hard cough after swallowing; Head tilt right during swallowing; Slow rate; Small bites/sips (Recline approx 45* with slight chin elevation), Follow solids with liquids Postural changes: Partially reclined for meals Oral care recommendations: Oral care QID (4x/day) Recommended consults: Consider ENT consultation (? ENT to determine if potential PES treatment may be helpful)   PATIENT REPORTED OUTCOME MEASURES (PROM): EAT-10: Pt returned 12/05/22 and scored 36/40 (lower scores = better QOL)   TODAY'S TREATMENT:  DATE:  02/20/23:  Swallow: Next session, pt can reduce HEP to x2/week. Pt currently having thin to nectar liquids. No overt s/sx aspiration PNA today. Pt with f/u with neurology (Dr. Lonna Cobb - [facility]) 03/12/23. He was independent with HEP. Is going to protrude tongue more with Masako from now on. With POs - pt had drinkable yogurt today with occasional throat clearing, pt stated due mostly to feeling of residue. SLP postulates pt with more frequent s/sx velopharyngeal incompetence during swallowing. Pt admitted to taking larger sips. SLP encouraged smaller sips.  Pt states his speech is more slurred after talking. SLP questions some neurological basis worsening pt's overall swallowing and speech ability. Follow up with neurologist 03/12/23. Pt and SLP agree pt could return  in 4 weeks instead of 2. Speech: SLP discussed and educated pt and wife re: speech compensations, and provided a handout of articulatory HEP and reviewed with pt and wife. Pt to begin this asap for speech compensations.  01/23/23: Pt saw Dr. Irene Pap 01/19/23: Assessment/plan above under "Objective findings". SLP discussed that pt should perform HEP until mid-October. No plan to have follow up MBS at this time due to pt performance in ST sessions relatively unchanged in last 2-3 sessions. With POs (wet puree and water), wet voice intermittently, pt using safe swallow compensations with success. No overt s/sx aspiration PNA at this time. SLP told pt to cont with current solid consistency - said could have puree soups (homogenous consistency) and broths - teaspoons.  Pt completed HEP with independence. Pt agrees with SLP his condition is currently stable and will cont to be seen every other week for two months. SLP questions if pt's swallowing will improve with improved velopharyngeal closure, and the role possible glottic insufficiency may be playing in efficacy/efficiency of pt's swallow.  01/16/23: Pt to see Dr. Irene Pap 01/19/23. Pt performed oral care prior to ST today. SLP plan is to have pt perform dysphagia HEP for approx 10 weeks prior to [facility] (which will be end of September). Pt appears to cont to gain swallow strength week after week. With POs, >75% of the time Federick had audible swallow, possibly insufficient velopharyngeal closure, as pt again (as in previous session) sniffed after swallowing water. To this point, pt has remained free of overt s/sx aspiration PNA and has followed safe swallow strategies in sessions, sometimes with rare initial reminder to cough/clear throat after 2nd or 3rd swallow with each bolus. Wet voice heard approx 50% of the time with yogurt and water, mostly cleared with multiple swallows. SLP cont to be unsure about the extent of the cranial nerve disorder on pt's swallow  function.  Pt was independent with HEP.   01/08/23: SLP  educated pt/wife rationale for waiting to schedule MBS/FEES until mid-September or later will give Oswaldo ample time for strength gain in swallowing musculature (at least 8 weeks). No overt s/sx aspiration PNA this session; pt ate small spoons of baby food yams, and drank water. SLP had to provide initial cue to cough after a bite of yams (pt was inconsistent) but then [place] cont'd with consistent coughing after last swallow of bolus. PT to cont with solid POs and liquid POs at home. Appointment with Dr. Irene Pap is 01/19/23. Min A with HEP for tongue protrusion on Masako. Will keep pt once/week to monitor for s/sx PNA and to possibly upgrade pt to regular pureed (instead of baby food) after assessment in-session.  01/01/23: Pt reports difficult to get trigger on first 1-3 reps of Masako  and supraglottic but once he passes these number of reps - no difficulty with triggering swallow. No problem at all triggering effortful swallow from rep #1 on. Today pt did not have difficulty with triggering swallows with Masako. Although lingual protrusion was adequate, SLP told pt to protrude tongue a bit further for the first 2 reps and he could do so. SLP told pt protruding tongue to that length was preferable to the length he currently uses, so he should try to have more reps with further protrusion. Last session, SLP added dynamic Shaker to pt's regimen, 30 1-second reps at least BID. With POs, pt made through one 4 oz jar in one week. SLP suggested pt use drinkable yogurt, Fair life milk, or Boost as POs as well, as long as pt is using safe swallow techniques from [facility] 10/30/22. Pt req'd cues for a cough after every bite/sip, and SLP provided rationale and reminder that pt's sensation was not  100% when material was penetrated.  12/26/22: No s/sx any aspiration PNA today, nor in this last week at all. Pt cont to perform PO trials with water at home, but coughs with  every 3-4th swallow. SLP strongly urged pt to complete cough every swallow based upon MBS results. Pt followed other precautions with independence. SLP trialed baby food applesauce with pt with liquid wash and completed with success. SLP told pt to try 10 boluses as many times/day as desired but always stopping if he feels he has less than 8/10 stamina (10=WNL), and always perform thorough oral care before and after POs. SLP also suggested pt could try baby food applesauce-consistency pudding or yogurt. Pt completed HEP today independently. Due to initiating puree today pt will keep frequency at once/week.   12/19/22: No s/sx any aspiration PNA today, nor in this last week at all. Performed oral care just prior to appointment today. Pt independent with HEP today. Protruded tongue past lips independently. Pt cont to take one bottle of water per day, multiple (x2-3) sips per bolus with multiple hard swallows and cough if necessary. Instead, SLP told pt to take one sip with two-three hard swallows, cough, reswallow hard. SLP to attempt smooth purees with pt next session -will have been 4 weeks since initiation of HEP and pt without any overt s/sx aspiration PNA at this time.  12/12/22: Pt performed oral care prior to ST. Long discussion about whether or not BaSwallow exam would be safe for pt at this time. Afterwards, SLP emailed SLP who performed MBS with pt and she stated BaSwallow exam might be more difficult to perform safely at this time. Pt performed HEP independently and used safe swallow techniques with sips of water.   12/05/22: Pt independent with HEP today, will use mirror for Masako until extent of tongue protrusion habituated. Pt demo'd strategies with sip water today, independently. Pt and SLP agree pt can reduce to once/week. SLP provided s/sx aspiration PNA and pt with mod I.  12/03/22: Pt has been performing HEP regularly. Dora Sims he performed exercises with initial mod cue for Masako (tongue  protrusion).  He drank water without overt s/sx aspiration x3 using safe swallow strategies independently.  11/24/22:  Today SLP explained results of pt's MBS and the rationale for the safe swallow strategies. Pt demonstrated understanding of each and reports he has been using these with one bottle of water each day. No overt s/sx of aspiration PNA to date with >1 week of PO water. Pt has NOT been performing oral care prior  to POs. SLP educated pt re: swallow HEP procedure. Pt performed each exercise and SLP ensured correctness prior to moving to next exercise. Pt req'd min A with strength of cough with supraglottic.  PATIENT EDUCATION: Education details: see "today's treatment" Person educated: Patient and Spouse Education method: Explanation, Demonstration, Verbal cues, and Handouts Education comprehension: verbalized understanding, returned demonstration, verbal cues required, and needs further education   ASSESSMENT:  CLINICAL IMPRESSION: RECERT TODAY. Patient is a 62 y.o. M who was seen today for treatment of swallowing. SLP reviewed pt's swallowing HEP and ensured pt is safe and correct with safe swallow strategies. Please see "today's treatment" for more details. Pt took HEP for speech compensations today and will work on this at home. He does not desire formal ST for speech intelligibility at this time. It should be noted that pt's prognosis after June 2024 MBS, improvement for a functional swallow was GUARDED.   OBJECTIVE IMPAIRMENTS: include dysarthria and dysphagia. These impairments are limiting patient from ADLs/IADLs, effectively communicating at home and in community, and safety when swallowing. Factors affecting potential to achieve goals and functional outcome are co-morbidities and severity of impairments. Patient will benefit from skilled SLP services to address above impairments and improve overall function.  REHAB POTENTIAL: Fair given prognosis from MBS   GOALS: Goals  reviewed with patient? Yes  SHORT TERM GOALS: Target date: 12/25/22  Pt will perform HEP with rare min A in two sessions Baseline:12/03/22 Goal status: Met  2.  Pt will follow prescribed safe swallow strategies in 3 sessions Baseline: 12/03/22, 12/12/22 Goal status: Partially met  3.  Pt will perform HEP 6/7 days a week for two weeks, per HEP monitoring method Baseline:  Goal status: MET  4.  Pt and/or wife will ID 3 overt s/sx aspiration PNA with modified independence Baseline:  Goal status: met   LONG TERM GOALS: Target date: 01/30/23; 05/21/23  Pt will perform HEP with rare min A in two sessions Baseline: 12/19/22 Goal status: Met  2.  Pt will follow prescribed safe swallow strategies in 3 sessions, after 12/25/22 Baseline: 01/08/23, 01/16/23 Goal status: met  3.  Pt will undergo follow up MBS when/if clinically indicated Baseline:  Goal status: DEFERRED  4.  Pt will score higher on PROM than initial score Baseline:  Goal status: DEFERRED, and ongoing  5.   Pt will perform HEP independently in three sessions Baseline: 01/16/23, 01/23/23 Goal status: Met  6.   Pt will remain free of overt s/sx aspiration PNA over three sessions Baseline: 02/20/23 Goal Status: Partially met, and ongoing    PLAN:  SLP FREQUENCY: 1x/month  SLP DURATION: other: 90 days  PLANNED INTERVENTIONS: Aspiration precaution training, Pharyngeal strengthening exercises, Diet toleration management , Environmental controls, Trials of upgraded texture/liquids, Cueing hierachy, Internal/external aids, SLP instruction and feedback, Compensatory strategies, Patient/family education, and Repeat MBS PRN    Ikea Demicco, CCC-SLP 02/20/2023, 12:09 PM

## 2023-02-20 NOTE — Patient Instructions (Signed)
Speech Exercises  Repeat these phrases 2 times, 2 times a day  Call the cat "Buttercup" A calendar of [place] Four floors to cover Yellow oil ointment Fellow lovers of felines Catastrophe in [place] Plump plumbers' plums The church's chimes chimed Telling time 'til eleven Five valve levers Keep the gate closed Go see that guy Fat cows give milk [employer] Gophers Fat frogs flip freely [employer] into bed Get that game to [employer] Thick thistles stick together Cinnamon aluminum linoleum Black bugs blood Lovely lemon linament Red leather, yellow leather  Big grocery buggy    Purple baby carriage [facility] [place] copper coffee pot Ripe purple cabbage Three free throws [employer] tackled  [employer] dipped the dessert  [employer] Buckle that [employer] of [employer] Shirts shrink, shells shouldn't [place] [employer] Take the tackle box File the flash message Give me five flapjacks Fundamental relatives Dye the pets purple Talking [place] time after time Dark chocolate chunks Political landscape of the kingdom Actuary genius We played yo-yos yesterday

## 2023-02-21 DIAGNOSIS — Z85818 Personal history of malignant neoplasm of other sites of lip, oral cavity, and pharynx: Secondary | ICD-10-CM | POA: Diagnosis not present

## 2023-02-21 DIAGNOSIS — R1310 Dysphagia, unspecified: Secondary | ICD-10-CM | POA: Diagnosis not present

## 2023-02-21 DIAGNOSIS — Y842 Radiological procedure and radiotherapy as the cause of abnormal reaction of the patient, or of later complication, without mention of misadventure at the time of the procedure: Secondary | ICD-10-CM | POA: Diagnosis not present

## 2023-02-21 DIAGNOSIS — R634 Abnormal weight loss: Secondary | ICD-10-CM | POA: Diagnosis not present

## 2023-03-06 ENCOUNTER — Ambulatory Visit: Payer: BC Managed Care – PPO

## 2023-03-09 ENCOUNTER — Encounter: Payer: Self-pay | Admitting: Cardiovascular Disease

## 2023-03-09 ENCOUNTER — Ambulatory Visit: Payer: BC Managed Care – PPO | Attending: Cardiovascular Disease | Admitting: Cardiovascular Disease

## 2023-03-09 VITALS — BP 122/78 | HR 77 | Ht 69.5 in | Wt 156.6 lb

## 2023-03-09 DIAGNOSIS — I951 Orthostatic hypotension: Secondary | ICD-10-CM

## 2023-03-09 NOTE — Progress Notes (Signed)
Cardiology Office Note:  .   Date:  03/09/2023  ID:  Todd [name], DOB July 02, 1960, MRN [id number] PCP: Gaspar Garbe, MD  [facility] Providers Cardiologist:  Myonna Chisom    History of Present Illness: Todd [name]   Todd [name] is a 62 y.o. 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         Risk Assessment/Calculations:             Physical Exam:   VS:  BP 122/78   Pulse 77   Ht 5' 9.5" (1.765 m)   Wt 156 lb 9.6 oz (71 kg)   SpO2 94%   BMI 22.79 kg/m    Wt Readings from Last 3 Encounters:  03/09/23 156 lb 9.6 oz (71 kg)  02/10/23 153 lb (69.4 kg)  02/03/23 152 lb (68.9 kg)    GEN: Well nourished, well developed in no acute distress NECK: No JVD; No carotid bruits CARDIAC: RRR, no murmurs, rubs, gallops RESPIRATORY:  Clear to auscultation without rales, wheezing or rhonchi  ABDOMEN: Soft, non-tender, non-distended EXTREMITIES:  No edema; No deformity   ASSESSMENT AND PLAN: .      Orthostatic hypotension: [name] presents for further evaluation of orthostatic hypotension.  He is at tonsillar cancer and now relies on his nutrition through a G-tube and tube feedings.  He is lost 35 pounds.  There is a good chance that his symptoms are due to hypovolemia.  He gets 5 cans of boost a day +4 bottles of water.  He says that his stomach will not accommodate much more than that.  His last BUN was 36 which suggest that he is volume depleted. Will get a basic metabolic profile today.  He  has been given a prescription for Florinef which I have encouraged him to start tomorrow.  See him again in 3 months for follow-up visit.       Dispo: 3 months    Signed, Kristeen Miss, MD

## 2023-03-09 NOTE — Patient Instructions (Signed)
Lab Work: BMET today If you have labs (blood work) drawn today and your tests are completely normal, you will receive your results only by: [employer] (if you have MyChart) OR A paper copy in the mail If you have any lab test that is abnormal or we need to change your treatment, we will call you to review the results.  Testing/Procedures: Echocardiogram Your physician has requested that you have an echocardiogram. Echocardiography is a painless test that uses sound waves to create images of your heart. It provides your doctor with information about the size and shape of your heart and how well your heart's chambers and valves are working. This procedure takes approximately one hour. There are no restrictions for this procedure. Please do NOT wear cologne, perfume, aftershave, or lotions (deodorant is allowed). Please arrive 15 minutes prior to your appointment time.  Follow-Up: At [facility], you and your health needs are our priority.  As part of our continuing mission to provide you with exceptional heart care, we have created designated Provider Care Teams.  These Care Teams include your primary Cardiologist (physician) and Advanced Practice Providers (APPs -  Physician Assistants and Nurse Practitioners) who all work together to provide you with the care you need, when you need it.  Your next appointment:   3 month(s)  Provider:   Kristeen Miss, MD

## 2023-03-10 ENCOUNTER — Encounter: Payer: Self-pay | Admitting: Internal Medicine

## 2023-03-10 DIAGNOSIS — I951 Orthostatic hypotension: Secondary | ICD-10-CM | POA: Diagnosis not present

## 2023-03-11 ENCOUNTER — Other Ambulatory Visit: Payer: Self-pay

## 2023-03-11 ENCOUNTER — Inpatient Hospital Stay: Payer: BC Managed Care – PPO | Admitting: Dietician

## 2023-03-11 LAB — BASIC METABOLIC PANEL
BUN/Creatinine Ratio: 27 — ABNORMAL HIGH (ref 10–24)
BUN: 43 mg/dL — ABNORMAL HIGH (ref 8–27)
CO2: 31 mmol/L — ABNORMAL HIGH (ref 20–29)
Calcium: 10.4 mg/dL — ABNORMAL HIGH (ref 8.6–10.2)
Chloride: 97 mmol/L (ref 96–106)
Creatinine, Ser: 1.57 mg/dL — ABNORMAL HIGH (ref 0.76–1.27)
Glucose: 120 mg/dL — ABNORMAL HIGH (ref 70–99)
Potassium: 4.9 mmol/L (ref 3.5–5.2)
Sodium: 139 mmol/L (ref 134–144)
eGFR: 50 mL/min/{1.73_m2} — ABNORMAL LOW (ref >59)

## 2023-03-12 ENCOUNTER — Encounter: Payer: Self-pay | Admitting: Hematology and Oncology

## 2023-03-12 ENCOUNTER — Telehealth: Payer: Self-pay | Admitting: Cardiovascular Disease

## 2023-03-12 DIAGNOSIS — Z931 Gastrostomy status: Secondary | ICD-10-CM | POA: Diagnosis not present

## 2023-03-12 DIAGNOSIS — R634 Abnormal weight loss: Secondary | ICD-10-CM | POA: Diagnosis not present

## 2023-03-12 DIAGNOSIS — Y842 Radiological procedure and radiotherapy as the cause of abnormal reaction of the patient, or of later complication, without mention of misadventure at the time of the procedure: Secondary | ICD-10-CM | POA: Diagnosis not present

## 2023-03-12 DIAGNOSIS — Z85818 Personal history of malignant neoplasm of other sites of lip, oral cavity, and pharynx: Secondary | ICD-10-CM | POA: Diagnosis not present

## 2023-03-12 NOTE — Telephone Encounter (Signed)
Wife is calling in patient bp went up to 171/112 on last night. Calling to see if they should stop the medication. Please advise

## 2023-03-12 NOTE — Progress Notes (Signed)
Nutrition Follow-up:  Patient completed concurrent chemoradiation for tonsil cancer. Final radiation 05/15/16 under the care of Dr. Basilio Cairo. Patient now with dysphagia and recurrent aspiration pneumonia   Met with patient and wife in office. Patient is doing well. Reports he is increasing oral intake some "When he feels like it" Recalls tolerating milkshakes, juice, soups, yogurt. He continues giving 5 cartons Boost VHC. He is drinking one 500 ml bottle of water plus giving additional 3 (500 ml) bottles via tube. Patient reports finishing Prostat bottle last week. He is asking if he needs to order more of this.    Medications: reviewed   Labs: 10/29 - glucose 120, BUN 43, Cr 1.57, Ca 10.4  Anthropometrics: Wt 156 lb in RD office today - increased   10/1 - 153 lb 9/18 - 152 lb 4 oz  8/28 - 147 lb 3 oz  6/28 - 148 lb    Estimated Energy Needs  Kcals: 2576-2898 Protein: 103-116 Fluid: >/= 2.2 L  NUTRITION DIAGNOSIS: Inadequate oral intake continues - addressing with TF   MALNUTRITION DIAGNOSIS: Severe malnutrition improving    INTERVENTION:  Continue one carton Boost VHC 5x/day via tube D/C prostat given increasing oral intake Encouraged trying po intake daily Recommend increasing water intake given labs >/= 2.5 L.day Diet advancement per SLP    MONITORING, EVALUATION, GOAL: wt trends, intake, TF, diet advancement   NEXT VISIT: Tuesday November 26 after MD

## 2023-03-12 NOTE — Telephone Encounter (Signed)
Spoke with the patient's wife who states that the patient's blood pressure got up to 171/112 last night. He started on Florinef 2 days ago. She states that yesterday morning he did have a dizzy spell with blood pressure at 88/58. He states that last night he started running a fever and she has been giving him tylenol. The only complaint he has is ear pain. His temperature this morning is 100.31F and blood pressure is 138/81. He has not taken his florinef today. Advised to hold off on that for right now and I will let Dr. Elease Hashimoto know. She states that she is also waiting to hear back from his primary care provider.

## 2023-03-13 NOTE — Telephone Encounter (Signed)
Wife is calling back for update.

## 2023-03-13 NOTE — Telephone Encounter (Addendum)
Spoke with wife, Arline Asp (DPR on file). Patient's blood pressure has been doing much better, 144/90 this morning, instructed on Dr Sallee Provencal recommendation on taking florinef every other day. Arline Asp states they have decided to hold off on taking the florinef for right now to see how his blood pressure trends (doesn't wish for it to be removed from medication list just yet). Arline Asp has been in contact with patient's PCP concerning his ear and fever. No further needs at this time.

## 2023-03-16 ENCOUNTER — Ambulatory Visit (INDEPENDENT_AMBULATORY_CARE_PROVIDER_SITE_OTHER): Payer: BC Managed Care – PPO | Admitting: Otolaryngology

## 2023-03-16 ENCOUNTER — Encounter (INDEPENDENT_AMBULATORY_CARE_PROVIDER_SITE_OTHER): Payer: Self-pay | Admitting: Otolaryngology

## 2023-03-16 ENCOUNTER — Telehealth: Payer: Self-pay

## 2023-03-16 VITALS — BP 78/53 | HR 79

## 2023-03-16 DIAGNOSIS — R1310 Dysphagia, unspecified: Secondary | ICD-10-CM | POA: Diagnosis not present

## 2023-03-16 DIAGNOSIS — Z923 Personal history of irradiation: Secondary | ICD-10-CM

## 2023-03-16 DIAGNOSIS — H9191 Unspecified hearing loss, right ear: Secondary | ICD-10-CM | POA: Diagnosis not present

## 2023-03-16 DIAGNOSIS — K1379 Other lesions of oral mucosa: Secondary | ICD-10-CM

## 2023-03-16 DIAGNOSIS — H9201 Otalgia, right ear: Secondary | ICD-10-CM

## 2023-03-16 DIAGNOSIS — R490 Dysphonia: Secondary | ICD-10-CM | POA: Diagnosis not present

## 2023-03-16 DIAGNOSIS — Z85819 Personal history of malignant neoplasm of unspecified site of lip, oral cavity, and pharynx: Secondary | ICD-10-CM

## 2023-03-16 DIAGNOSIS — R0981 Nasal congestion: Secondary | ICD-10-CM

## 2023-03-16 DIAGNOSIS — H6991 Unspecified Eustachian tube disorder, right ear: Secondary | ICD-10-CM

## 2023-03-16 DIAGNOSIS — T17908A Unspecified foreign body in respiratory tract, part unspecified causing other injury, initial encounter: Secondary | ICD-10-CM

## 2023-03-16 DIAGNOSIS — R1312 Dysphagia, oropharyngeal phase: Secondary | ICD-10-CM

## 2023-03-16 DIAGNOSIS — J3801 Paralysis of vocal cords and larynx, unilateral: Secondary | ICD-10-CM | POA: Diagnosis not present

## 2023-03-16 DIAGNOSIS — K148 Other diseases of tongue: Secondary | ICD-10-CM

## 2023-03-16 DIAGNOSIS — J383 Other diseases of vocal cords: Secondary | ICD-10-CM

## 2023-03-16 MED ORDER — SALINE SPRAY 0.65 % NA SOLN: 1.0000 | NASAL | 5 refills | Status: DC | PRN

## 2023-03-16 MED ORDER — METHYLPREDNISOLONE 4 MG PO TBPK: ORAL_TABLET | ORAL | 1 refills | Status: DC

## 2023-03-16 MED ORDER — AMOXICILLIN-POT CLAVULANATE 600-42.9 MG/5ML PO SUSR: 600.0000 mg | Freq: Two times a day (BID) | ORAL | 0 refills | Status: AC

## 2023-03-16 MED ORDER — FLUTICASONE PROPIONATE 50 MCG/ACT NA SUSP: 2.0000 | Freq: Every day | NASAL | 6 refills | Status: DC

## 2023-03-16 MED ORDER — AMOXICILLIN-POT CLAVULANATE 875-125 MG PO TABS: 1.0000 | ORAL_TABLET | Freq: Two times a day (BID) | ORAL | 0 refills | Status: DC

## 2023-03-16 NOTE — Patient Instructions (Addendum)
-   take antibiotic and steroid as directed - continue reflux medication - start nasal saline rinses with [employer]Med Bottle  - we will call from our surgery scheduler   Lloyd Huger Med Nasal Saline Rinse   - start nasal saline rinses with NeilMed Bottle available over the counter or online to help with nasal congestion

## 2023-03-16 NOTE — H&P (View-Only) (Signed)
 ENT Progress Note:  Update 03/16/23: His Neurology f/u is scheduled and currently pending. He drinks shakes for pleasure, but still relies on PEG for nutrition and takes medications via PEG. He has had sensation of decreased hearing on the right side and some ear discomfort. No fevers, no admissions to the hospital.    Initial Evaluation 01/19/23 Reason for Consult: dysphagia hx of oropharyngeal cancer and cXRT  HPI: Todd [name] is an 62 y.o. male with hx of oropharyngeal SCCa stage IVB (T3N3M0) diagnosed 2017, s/p primary cXRT who is here for evaluation of severe dysphagia.  He was referred by Dr. Basilio [name] and his current SLP, due to significant decline in swallowing function approximately 1 year ago and the need to rely on PEG for nutrition support secondary to four episodes of aspiration pneumonia.  He was previously diagnosed and followed by Dr. Jenne [name] ENT, who diagnosed his left oropharyngeal cancer, tonsil being primary site. He reports that approximately a year ago he started to have significant issues with his speech and swallowing.  Following primary chemoradiation for oropharyngeal cancer he was able to eat everything without problems, and his speech was normal.  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 Last spring approximately in April and May of 2024, he began to develop chills and fevers at home.  Oropharyngeal cancer recurrence was considered and he had PET/CT, neck MRI and chest CT. there was no evidence of disease recurrence however imaging demonstrated bilateral pneumonia and borderline enlarged mediastinal nodes.  Since then he had 4 episodes of pneumonia which was thought to be due to aspiration.  He was seen by pulmonary Dr. Celine [name] in May 2024.  Record review indicates that Dr. Celine [name] advised him to see his speech therapist, and stated in his note from May of 17th at the most likely cause of bilateral pneumonia is aspiration.   Plan comes to his swallowing, initially he noticed that he began to choke on both liquids and solids approximately a year ago.  He would have mucus and productive cough. Denies dyspnea with PNA and would get chills and fevers. Then he would develop productive cough.  After repeat modified barium swallow in June, due to concern for recurrent aspiration pneumonia and significant weight loss, he underwent PEG placement.  He is consuming liquids such as water coffee and orange juice by mouth but supplements calories with boost through the PEG.  He also has a history of cervical myelopathy with bilateral numbness and tingling in his hands.  Had 2 neurosurgical consultations, locally and at [facility], and due to history of radiation was advised not to undergo procedure because the risks would be significant if ACDF was attempted.  Seeing neurology currently, although unclear what was the initial consult reason. Being f/b Neurology Dr Todd [name], at [facility], and has a scheduled appt 03/02/23     Records Reviewed: Multiple office notes reviewed most pertinent summarized below and in HPI MBS 10/30/2022 Clinical Impression: Clinical Impression: Patient presents with mild oral and severe pharyngo-cervical esophageal dysphagia presumed due to iatrogenic effects of XRT that is significantly worse compared to prior MBS conducted by this SLP in 2018.   SLP reviewed note from [facility] at [facility] in 2023/11 but could not view images. 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  Head tilt to the right attempted to aid oral transiting/control with partial effectiveness.  Pharyngeal swallow marked by severe hypomotility resulting in minimal transiting of barium into esophagus and laryngeal penetration/aspiration. Mulitple dry swallows *pt initiated* is helpful to decrease amount of retention but does not fuly clear it, even  after 5-6 swallows with small single liquid bolus.  Head of chair reclined to approx 45* with slight neck extension did decrease amount of penetration and aspiration as gravity faciliated retention of barium in posterior pharynx.  Even in posterior position and with use of right tongue for oral transiting, pt penetrated very small bite of masticated cracker after the swallow as pharyngeal retention spilled into open airway.  Cued cough effectively cleared cracker into pharynx for pt to swallow with liquids.  Clearance into cervical esophagus was significantly impaired with gross retention of liquids as pyriform sinus.         At this time, pt is very high aspiration and malnutrition, dehydration risk with his current level of dysphagia.  Note plan is for PEG next week *11/07/2022  per pt and SLP agrees this is warranted.  SLP reviewed MBS extensively with pt and reinforced most effective compensation strategies.     Encouraged pt to continue po with strict adherence to compensations including head of bed/chair reclined, chin elevated, multiple swallows per bolus, cough/throat clear and expectorate prn.      Question if he may benefit from intervention for PES opening to mitigate his dysphagia - advised he speak to Dr Todd [name], ENT, re: potential.     Unfortunately [name] current dysphagia is profound and given progressive deficit (presumed from XRT effects, fibrosis) for 15 months, prognosis for swallow to return to functional level is guarded at best.    Advised pt consider ordering antichoking device for emergent use.  Thanks so much for this consult.   Factors that may increase risk of adverse event in presence of aspiration [name] & [name] 2021): Factors that may increase risk of adverse event in presence of aspiration [name] & [name] 2021): Reduced saliva    Past Medical History:  Diagnosis Date   Acquired hypothyroidism    secondary to radiation //   followed by pcp   Arthritis     shoulders, neck, knees, feet   Bilateral sensorineural hearing loss    due to radiation   Decreased range of motion of neck    due to fibrotic changes of 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   Denervation atrophy of muscle    tongue ,  chronic ,  due to radiation injury   (neurologist-- dr aKate Sable ([facility] [place])  EMG in care everywhere done 03-11-2022   Frequency of urination    GERD (gastroesophageal reflux disease)    History of cancer chemotherapy 2017   tonsil cancer   03-24-2016  to 05-06-2016   History of external beam radiation therapy    03-24-2016  to 05-15-2016  Left Tonsil and Bilateral Neck  @ [facility]   Hoarseness, chronic    due to radiation   Hyperlipidemia, mixed    Hypertension    Inguinal hernia, left    Lower facial weakness    intermittant left lower facial weakness   Numbness and tingling in left arm    intermittently per pt   Oropharyngeal dysphagia    hx neck radiation for left tonsil cancer ;  Speech therapy, regular diet with precautions swallows okay   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   followed by ENT dr bates/  oncologist--- dr Bertis Ruddy;  bx 10/ 2017 left tonsil showed  invasive SCC;  completed chemo 05-06-2016 and completed radiation of left tonsil/ bilateral neck 05-15-2016  w/ radiation injury to tongue (denervation chronic)   Wears glasses    Wears hearing aid in both ears     Past Surgical History:  Procedure Laterality Date   INGUINAL HERNIA REPAIR Left 05/02/2022   Procedure: OPEN LEFT INGUINAL HERNIA REPAIR WITH MESH;  Surgeon: Berna Bue, MD;  Location: [facility];  Service: General;  Laterality: Left;   IR GASTROSTOMY TUBE MOD SED  11/07/2022   IR GASTROSTOMY TUBE REMOVAL  01/30/2017   IR GENERIC HISTORICAL  03/20/2016   IR US GUIDE VASC ACCESS RIGHT 03/20/2016 Simonne Come, MD [facility]   IR GENERIC HISTORICAL  03/20/2016   IR FLUORO GUIDE PORT INSERTION RIGHT 03/20/2016 Simonne Come, MD [facility]   IR GENERIC  HISTORICAL  03/20/2016   IR GASTROSTOMY TUBE MOD SED 03/20/2016 Simonne Come, MD [facility]   IR REMOVAL TUN ACCESS W/ PORT W/O FL MOD SED  01/30/2017    Family History  Problem Relation Age of Onset   Dementia Father    Diabetes Father    Cancer Maternal Grandmother        unknown ca   Asthma Son     Social History:  reports that he has never smoked. He has never used smokeless tobacco. He reports that he does not drink alcohol and does not use drugs.  Allergies:  Allergies  Allergen Reactions   Phenergan [Promethazine Hcl]     Got really nervous and shaky.Virgel Gess Other (See Comments)   Tamsulosin Other (See Comments)    Caused orthostatic hypotension per pt    Medications: I have reviewed the patient's current medications.  The PMH, PSH, Medications, Allergies, and SH were reviewed and updated.  ROS: Constitutional: Negative for fever, weight loss and weight gain. Cardiovascular: Negative for chest pain and dyspnea on exertion. Respiratory: Is not experiencing shortness of breath at rest. Gastrointestinal: Negative for nausea and vomiting. Neurological: Negative for headaches. Psychiatric: The patient is not nervous/anxious  Blood pressure (!) 78/53, pulse 79, SpO2 96%.  PHYSICAL EXAM:  Exam: General: Well-developed, well-nourished Communication and Voice: Raspy Respiratory Respiratory effort: Equal inspiration and expiration without stridor Cardiovascular Peripheral Vascular: Warm extremities with equal color/perfusion Eyes: No nystagmus with equal extraocular motion bilaterally Neuro/Psych/Balance: Patient oriented to person, place, and time; Appropriate mood and affect; Gait is intact with no imbalance; 4 Cranial nerves: Evidence of diffuse tongue fasciculations, left cranial nerve XII deficit, reduced soft palate movement on the left > right side likely due to fibrosis Head and Face Inspection: Normocephalic and atraumatic without  mass or lesion Facial Strength: Facial motility symmetric and full bilaterally ENT Pinna: External ear intact and fully developed External canal: Canal is patent with intact skin Tympanic Membrane: right TM opaque suspect fluid in middle ear, L TM normal External Nose: No scar or anatomic deformity Internal Nose: Septum is slightly deviated to the left. No polyp, or purulence. Mucosal edema and erythema present.  Bilateral inferior turbinate hypertrophy.  Lips, Teeth, and gums: Mucosa and teeth intact and viable TMJ: No pain to palpation with full mobility Oral cavity/oropharynx: No erythema or exudate, no lesions present Nasopharynx: No mass or lesion with intact mucosa.  Thick yellow mucus and crusting along the nasopharynx. Evidence of VPI with no palate closure particularly on the left side during scope exam Hypopharynx: pooling of secretions along the right piriform and bilateral tongue base vallecula Larynx  Glottic: Left vocal fold paralysis without lesion or mass Supraglottic: Normal appearing epiglottis and AE folds Interarytenoid Space: Severe postcricoid edema and pachydermia. Thick yellow mucus along the laryngeal structures  Subglottic Space: Patent without lesion or edema Neck Neck and Trachea: Midline trachea without mass or lesion post XRT changes along left neck Thyroid: No mass or nodularity Lymphatics: No lymphadenopathy  Procedure:  Preoperative diagnosis: hoarseness L VF paralysis and dysphagia aspiration   Postoperative diagnosis:   same  Procedure: Flexible fiberoptic laryngoscopy with stroboscopy (57846)  Surgeon: Ashok Croon, MD  Anesthesia: Topical lidocaine and Afrin  Complications: None  Condition is stable throughout exam  Indications and consent:   The patient presents to the clinic with hoarseness. All the risks, benefits, and potential complications were reviewed with the patient preoperatively and informed verbal consent was  obtained.  Procedure: The patient was seated upright in the exam chair.   Topical lidocaine and Afrin were applied to the nasal cavity. After adequate anesthesia had occurred, the flexible telescope was passed into the nasal cavity. The nasopharynx was patent without mass or lesion. The scope was passed behind the soft palate and directed toward the base of tongue. The base of tongue was visualized and was symmetric with no apparent masses or abnormal appearing tissue. There were no signs of a mass or pooling of secretions in the piriform sinuses. The supraglottic structures were normal.  There was evidence of left VF paralysis. The medial edges were bowed and atrophic. Closure was incomplete. Periodicity was present. The mucosal wave and amplitude were intact. There is moderate interarytenoid pachydermia and post cricoid edema. The mucosa appears without lesions. There was thick yellow mucus along the nasopharynx/supraglottic structures/hypopharynx   The laryngoscope was then slowly withdrawn and the patient tolerated the procedure well. There were no complications or blood loss.    Studies Reviewed:CT chest 09/24/22 IMPRESSION: 1. Progressive diffuse peribronchovascular ground-glass, nodularity and consolidation with scattered septal thickening, findings which may be due to a worsening atypical/fungal infectious process. Lymphangitic carcinomatosis is not excluded. 2. Borderline enlarged mediastinal lymph nodes, possibly reactive. Recommend continued attention on follow-up as malignancy cannot be excluded. 3. Coronary artery calcification  MRI neck 09/15/22 MRI OF THE NECK WITH CONTRAST   TECHNIQUE: Multiplanar, multisequence MR imaging was performed following the administration of intravenous contrast.   CONTRAST:  7mL GADAVIST GADOBUTROL 1 MMOL/ML IV SOLN   COMPARISON:  PET-CT 09/04/2022, CT neck 03/13/2021   FINDINGS: Pharynx and larynx: The nasal cavity and nasopharynx  are unremarkable.   There is no abnormality in the tongue to correspond to the area of ill-defined radiotracer uptake seen on the recent PET-CT. There is no mass lesion or abnormal enhancement. There is no evidence of mass lesion or abnormal enhancement in the region of either tonsil. The oropharynx is unremarkable. Ill-defined T2 hyperintensity in the left parapharyngeal space with mild non masslike enhancement likely reflects posttreatment change, corresponding to ill-defined stranding seen on the CT neck going back to 2019.   The hypopharynx and larynx are unremarkable. The vocal folds are normal in appearance   There is no retropharyngeal fluid collection.  The airway is patent.   Salivary glands: The parotid and submandibular glands are unremarkable.   Thyroid: Unremarkable.   Lymph nodes: There is no pathologic lymphadenopathy in the neck.   Vascular: The major flow voids are normal.   Limited intracranial: Unremarkable.   Visualized orbits: Unremarkable.   Mastoids and visualized paranasal sinuses: There is mucosal thickening in the maxillary sinuses,  left worse than right. There is a new right mastoid effusion.   Skeleton: There is multilevel degenerative change of the cervical spine with disc space narrowing most advanced at C4-C5. There is moderate spinal canal stenosis at C2-C3 through C4-C5.   Upper chest: The imaged lung apices are grossly unremarkable.   Other: None.   IMPRESSION: 1. No evidence of recurrent disease or pathologic lymphadenopathy in the neck. Specifically, there is no suspicious finding in the tongue to correspond to the focus of ill-defined radiotracer uptake on the recent PET-CT. NIRADS 1. 2. Right mastoid effusion, nonspecific.  4//25/24 PET/CT  IMPRESSION: 1. Asymmetric focus of increased radiotracer uptake is identified within the left posterior tongue. Etiology indeterminate. Cannot exclude recurrent tumor. Consider further  evaluation with direct visualization and/or contrast enhanced soft tissue neck CT or MRI. 2. No tracer avid cervical lymph nodes. 3. Heterogeneous, multifocal asymmetric increased uptake is identified within the left lower lobe. On the corresponding nondiagnostic CT images there is extensive tree-in-bud nodularity, thickening of the peribronchovascular interstitium, mucoid impaction, and atelectasis/airspace consolidation. Imaging findings are more likely to reflect a inflammatory/infectious process. Suspect chronic recurrent aspiration. Advise clinical correlation for signs/symptoms aspiration. Additionally, follow-up imaging with diagnostic CT of the chest in 3 months is recommended to ensure resolution and to exclude a central obstructing lesion which could also account for these changes. 4. Mildly tracer avid lymph nodes within the mediastinum and left hilum. In the setting of inflammation/infection these are likely reactive. Metastatic adenopathy is considered less favored but cannot be excluded with a high degree of certainty in this patient who has a history of known malignancy. Attention to these lymph nodes on follow-up imaging is advised. 5. Diffuse tree-in-bud nodularity is also identified within the basilar right upper lobe, right middle lobe and right lower lobe. This is compatible with an inflammatory/infectious bronchiolitis and may be seen with recurrent aspiration. 6. Coronary artery calcifications. 7. Prostate gland enlargement. 8. Asymmetric opacification of the left maxillary sinus. Correlate for any clinical signs or symptoms of sinusitis.     Assessment/Plan: Encounter Diagnoses  Name Primary?   Decreased hearing of right ear Yes   Dysfunction of right eustachian tube    Oropharyngeal dysphagia    Vocal fold paralysis, left    Velopharyngeal insufficiency, acquired    Paralysis of left side of tongue    History of oropharyngeal cancer    Aspiration into  airway, initial encounter    History of radiation to head and neck region    Dysphagia, unspecified type    Glottic insufficiency    Vocal fold atrophy     62 year old male with history of cXRT for stage IVb left oropharyngeal cancer in 2017, who is here for initial consultation with me due to severe dysphagia primarily in oropharynx and dysarthria for approximately 1 year and recurrent aspiration pneumonia.  Had total of 4 aspiration pneumonia episodes and was seen by pulmonary with recommendation to follow with SLP, and limit antibiotic use.  Repeat MBS in June 2024 with evidence of pharyngeal dysphagia and aspiration.  Underwent PEG placement, and working with speech, liquid only intake by mouth and boost supplementation via PEG.  He reports significant decline in speech and swallowing about a year ago, and prior to that had normal speech and was tolerating regular diet without issues.   On my exam today I was able to do evaluation of UES/upper esophagus and post-cricoid area in the office advancing flexible scope passed postcricoid area- no stricture seen.  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  There was significant pooling of secretions along the vallecula bilaterally.  There was evidence of left cranial nerve XII deficit, and diffuse tongue fasciculations.   I discussed exam findings with the patient.  My main concern is due to acute worsening of both speech/tongue mobility and evidence of diffuse tongue fasciculations, neurologic condition such as ALS needs to be excluded.  Other neurologic processes such as those affecting bulbar function also have to be ruled out.  Patient's severe pharyngeal dysphagia could still be related to sequela of radiation, but acute changes in his swallowing function are certainly suspicious for another cause.    I advised the patient to see his neurologist Dr Kate Sable at [facility] in October as scheduled and will communicate with his neurologist to ensure above concerns are addressed during his follow-up  Will consider procedural interventions in the future based on neurology evaluation and for further workup.  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  He will return after follow-up with his neurologist.  Will do videostrobe when he returns to determine if left vocal fold paralysis results in severe glottic insufficiency.   Update 03/16/23 He drinks milk shakes for pleasure, but is still PEG dependent. He is not sure if swallow therapy is helping.  He had multiple aspiration PNA's in the past, last one Feb 2024 No fever or dyspnea at home.  Post-XRT sequelae, L VF paralysis chronic dysphonia and dysphagia PEG dependence 2/2 aspiration PNA - videostrobe today with incomplete glottic closure and evidence of thick yellow secretions in hypo/oropharynx and along naso pharyngeal wall - Augmentin suspension BID x 2 weeks and steroid pack (Medrol dose pack)  2. Chronic nasal congestion and right sided middle ear effusion - Augmentin/Medrol pack as above  - nasal saline rinses with NeilMed - Flonase 2 puffs b/l nares BID  3. Chronic dysphagia and aspiration PEG/Dependent  -We discussed at length that a lot of his swallowing problems are due to postradiation side effects.  We also discussed that there could be a stricture above the level of UES and those are common in head neck cancer survivors.  -Glottic insufficiency and vocal fold atrophy with left vocal fold paralysis potentially contribute to his risk of aspiration -Additionally evidence of bilateral VPI weakens the strength of swallow at the level of the pharynx -We  discussed the trial of vocal fold injection augmentation and EGD balloon dilation of PES, possible injection augmentation for VPI and he would like to proceed -I explained to the patient that procedure may not fix his trouble with swallowing but could help him resume p.o. -We also discussed the need to get neurology evaluation to ensure he does not have any underlying neurologic conditions such as ALS due to history of normal swallowing after radiation and acute onset trouble with swallowing approximately a year ago -Will schedule for DML rigid bronchoscopy bilateral vocal fold injection augmentation, possible posterior pharyngeal wall soft palate injection augmentation, EGD and balloon dilation  4.  Eustachian tube dysfunction right side, recurrent issue per patient.  He reports poor hearing on the left side, and right side is his better ear -Will order audiogram to evaluate hearing and middle  ear function -If evidence of fluid on tympanograms will consider tube placement on the right side at the time of his procedure  5. Chronic GERD LPR - continue Pepcid   Ashok Croon, MD Otolaryngology [facility] Phone: [phone] Fax: [phone]  03/17/2023, 1:37 PM

## 2023-03-16 NOTE — Telephone Encounter (Signed)
   Pre-operative Risk Assessment    Patient Name: Todd [name]  DOB: May 06, 1961 MRN: [id number]     Request for Surgical Clearance    Procedure:  direct microlaryngoscopy, EGD, possible balloon dilation , vocal fold augmentation    Date of Surgery:  Clearance TBD                                 Surgeon:  Dr. Jackelyn Knife  Surgeon's Group or Practice Name:  [facility]  Phone number:  [phone] Fax number:  [phone]   Type of Clearance Requested:   - Medical    Type of Anesthesia:  General    Additional requests/questions:    SignedVernard Gambles   03/16/2023, 5:32 PM

## 2023-03-16 NOTE — Progress Notes (Unsigned)
ENT Progress Note:  Update 03/16/23: His Neurology f/u is scheduled and currently pending. He drinks shakes for pleasure, but still relies on PEG for nutrition and takes medications via PEG. He has had sensation of decreased hearing on the right side and some ear discomfort. No fevers, no admissions to the hospital.    Initial Evaluation 01/19/23 Reason for Consult: dysphagia hx of oropharyngeal cancer and cXRT  HPI: Todd [name] is an 62 y.o. male with hx of oropharyngeal SCCa stage IVB (T3N3M0) diagnosed 2017, s/p primary cXRT who is here for evaluation of severe dysphagia.  He was referred by Dr. Basilio [name] and his current SLP, due to significant decline in swallowing function approximately 1 year ago and the need to rely on PEG for nutrition support secondary to four episodes of aspiration pneumonia.  He was previously diagnosed and followed by Dr. Jenne [name] ENT, who diagnosed his left oropharyngeal cancer, tonsil being primary site. He reports that approximately a year ago he started to have significant issues with his speech and swallowing.  Following primary chemoradiation for oropharyngeal cancer he was able to eat everything without problems, and his speech was normal.  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 Last spring approximately in April and May of 2024, he began to develop chills and fevers at home.  Oropharyngeal cancer recurrence was considered and he had PET/CT, neck MRI and chest CT. there was no evidence of disease recurrence however imaging demonstrated bilateral pneumonia and borderline enlarged mediastinal nodes.  Since then he had 4 episodes of pneumonia which was thought to be due to aspiration.  He was seen by pulmonary Dr. Celine [name] in May 2024.  Record review indicates that Dr. Celine [name] advised him to see his speech therapist, and stated in his note from May of 17th at the most likely cause of bilateral pneumonia is aspiration.   Plan comes to his swallowing, initially he noticed that he began to choke on both liquids and solids approximately a year ago.  He would have mucus and productive cough. Denies dyspnea with PNA and would get chills and fevers. Then he would develop productive cough.  After repeat modified barium swallow in June, due to concern for recurrent aspiration pneumonia and significant weight loss, he underwent PEG placement.  He is consuming liquids such as water coffee and orange juice by mouth but supplements calories with boost through the PEG.  He also has a history of cervical myelopathy with bilateral numbness and tingling in his hands.  Had 2 neurosurgical consultations, locally and at [facility], and due to history of radiation was advised not to undergo procedure because the risks would be significant if ACDF was attempted.  Seeing neurology currently, although unclear what was the initial consult reason. Being f/b Neurology Dr Todd [name], at [facility], and has a scheduled appt 03/02/23     Records Reviewed: Multiple office notes reviewed most pertinent summarized below and in HPI MBS 10/30/2022 Clinical Impression: Clinical Impression: Patient presents with mild oral and severe pharyngo-cervical esophageal dysphagia presumed due to iatrogenic effects of XRT that is significantly worse compared to prior MBS conducted by this SLP in 2018.   SLP reviewed note from [facility] at [facility] in 2023/11 but could not view images. 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  Head tilt to the right attempted to aid oral transiting/control with partial effectiveness.  Pharyngeal swallow marked by severe hypomotility resulting in minimal transiting of barium into esophagus and laryngeal penetration/aspiration. Mulitple dry swallows *pt initiated* is helpful to decrease amount of retention but does not fuly clear it, even  after 5-6 swallows with small single liquid bolus.  Head of chair reclined to approx 45* with slight neck extension did decrease amount of penetration and aspiration as gravity faciliated retention of barium in posterior pharynx.  Even in posterior position and with use of right tongue for oral transiting, pt penetrated very small bite of masticated cracker after the swallow as pharyngeal retention spilled into open airway.  Cued cough effectively cleared cracker into pharynx for pt to swallow with liquids.  Clearance into cervical esophagus was significantly impaired with gross retention of liquids as pyriform sinus.         At this time, pt is very high aspiration and malnutrition, dehydration risk with his current level of dysphagia.  Note plan is for PEG next week *11/07/2022  per pt and SLP agrees this is warranted.  SLP reviewed MBS extensively with pt and reinforced most effective compensation strategies.     Encouraged pt to continue po with strict adherence to compensations including head of bed/chair reclined, chin elevated, multiple swallows per bolus, cough/throat clear and expectorate prn.      Question if he may benefit from intervention for PES opening to mitigate his dysphagia - advised he speak to Dr Todd [name], ENT, re: potential.     Unfortunately [name] current dysphagia is profound and given progressive deficit (presumed from XRT effects, fibrosis) for 15 months, prognosis for swallow to return to functional level is guarded at best.    Advised pt consider ordering antichoking device for emergent use.  Thanks so much for this consult.   Factors that may increase risk of adverse event in presence of aspiration [name] & [name] 2021): Factors that may increase risk of adverse event in presence of aspiration [name] & [name] 2021): Reduced saliva    Past Medical History:  Diagnosis Date   Acquired hypothyroidism    secondary to radiation //   followed by pcp   Arthritis     shoulders, neck, knees, feet   Bilateral sensorineural hearing loss    due to radiation   Decreased range of motion of neck    due to fibrotic changes of 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   Denervation atrophy of muscle    tongue ,  chronic ,  due to radiation injury   (neurologist-- dr aKate Sable ([facility] [place])  EMG in care everywhere done 03-11-2022   Frequency of urination    GERD (gastroesophageal reflux disease)    History of cancer chemotherapy 2017   tonsil cancer   03-24-2016  to 05-06-2016   History of external beam radiation therapy    03-24-2016  to 05-15-2016  Left Tonsil and Bilateral Neck  @ [facility]   Hoarseness, chronic    due to radiation   Hyperlipidemia, mixed    Hypertension    Inguinal hernia, left    Lower facial weakness    intermittant left lower facial weakness   Numbness and tingling in left arm    intermittently per pt   Oropharyngeal dysphagia    hx neck radiation for left tonsil cancer ;  Speech therapy, regular diet with precautions swallows okay   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   followed by ENT dr bates/  oncologist--- dr Bertis Ruddy;  bx 10/ 2017 left tonsil showed  invasive SCC;  completed chemo 05-06-2016 and completed radiation of left tonsil/ bilateral neck 05-15-2016  w/ radiation injury to tongue (denervation chronic)   Wears glasses    Wears hearing aid in both ears     Past Surgical History:  Procedure Laterality Date   INGUINAL HERNIA REPAIR Left 05/02/2022   Procedure: OPEN LEFT INGUINAL HERNIA REPAIR WITH MESH;  Surgeon: Berna Bue, MD;  Location: [facility];  Service: General;  Laterality: Left;   IR GASTROSTOMY TUBE MOD SED  11/07/2022   IR GASTROSTOMY TUBE REMOVAL  01/30/2017   IR GENERIC HISTORICAL  03/20/2016   IR US GUIDE VASC ACCESS RIGHT 03/20/2016 Simonne Come, MD [facility]   IR GENERIC HISTORICAL  03/20/2016   IR FLUORO GUIDE PORT INSERTION RIGHT 03/20/2016 Simonne Come, MD [facility]   IR GENERIC  HISTORICAL  03/20/2016   IR GASTROSTOMY TUBE MOD SED 03/20/2016 Simonne Come, MD [facility]   IR REMOVAL TUN ACCESS W/ PORT W/O FL MOD SED  01/30/2017    Family History  Problem Relation Age of Onset   Dementia Father    Diabetes Father    Cancer Maternal Grandmother        unknown ca   Asthma Son     Social History:  reports that he has never smoked. He has never used smokeless tobacco. He reports that he does not drink alcohol and does not use drugs.  Allergies:  Allergies  Allergen Reactions   Phenergan [Promethazine Hcl]     Got really nervous and shaky.Virgel Gess Other (See Comments)   Tamsulosin Other (See Comments)    Caused orthostatic hypotension per pt    Medications: I have reviewed the patient's current medications.  The PMH, PSH, Medications, Allergies, and SH were reviewed and updated.  ROS: Constitutional: Negative for fever, weight loss and weight gain. Cardiovascular: Negative for chest pain and dyspnea on exertion. Respiratory: Is not experiencing shortness of breath at rest. Gastrointestinal: Negative for nausea and vomiting. Neurological: Negative for headaches. Psychiatric: The patient is not nervous/anxious  Blood pressure (!) 78/53, pulse 79, SpO2 96%.  PHYSICAL EXAM:  Exam: General: Well-developed, well-nourished Communication and Voice: Raspy Respiratory Respiratory effort: Equal inspiration and expiration without stridor Cardiovascular Peripheral Vascular: Warm extremities with equal color/perfusion Eyes: No nystagmus with equal extraocular motion bilaterally Neuro/Psych/Balance: Patient oriented to person, place, and time; Appropriate mood and affect; Gait is intact with no imbalance; 4 Cranial nerves: Evidence of diffuse tongue fasciculations, left cranial nerve XII deficit, reduced soft palate movement on the left > right side likely due to fibrosis Head and Face Inspection: Normocephalic and atraumatic without  mass or lesion Facial Strength: Facial motility symmetric and full bilaterally ENT Pinna: External ear intact and fully developed External canal: Canal is patent with intact skin Tympanic Membrane: right TM opaque suspect fluid in middle ear, L TM normal External Nose: No scar or anatomic deformity Internal Nose: Septum is slightly deviated to the left. No polyp, or purulence. Mucosal edema and erythema present.  Bilateral inferior turbinate hypertrophy.  Lips, Teeth, and gums: Mucosa and teeth intact and viable TMJ: No pain to palpation with full mobility Oral cavity/oropharynx: No erythema or exudate, no lesions present Nasopharynx: No mass or lesion with intact mucosa.  Thick yellow mucus and crusting along the nasopharynx. Evidence of VPI with no palate closure particularly on the left side during scope exam Hypopharynx: pooling of secretions along the right piriform and bilateral tongue base vallecula Larynx  Glottic: Left vocal fold paralysis without lesion or mass Supraglottic: Normal appearing epiglottis and AE folds Interarytenoid Space: Severe postcricoid edema and pachydermia. Thick yellow mucus along the laryngeal structures  Subglottic Space: Patent without lesion or edema Neck Neck and Trachea: Midline trachea without mass or lesion post XRT changes along left neck Thyroid: No mass or nodularity Lymphatics: No lymphadenopathy  Procedure:  Preoperative diagnosis: hoarseness L VF paralysis and dysphagia aspiration   Postoperative diagnosis:   same  Procedure: Flexible fiberoptic laryngoscopy with stroboscopy (57846)  Surgeon: Ashok Croon, MD  Anesthesia: Topical lidocaine and Afrin  Complications: None  Condition is stable throughout exam  Indications and consent:   The patient presents to the clinic with hoarseness. All the risks, benefits, and potential complications were reviewed with the patient preoperatively and informed verbal consent was  obtained.  Procedure: The patient was seated upright in the exam chair.   Topical lidocaine and Afrin were applied to the nasal cavity. After adequate anesthesia had occurred, the flexible telescope was passed into the nasal cavity. The nasopharynx was patent without mass or lesion. The scope was passed behind the soft palate and directed toward the base of tongue. The base of tongue was visualized and was symmetric with no apparent masses or abnormal appearing tissue. There were no signs of a mass or pooling of secretions in the piriform sinuses. The supraglottic structures were normal.  There was evidence of left VF paralysis. The medial edges were bowed and atrophic. Closure was incomplete. Periodicity was present. The mucosal wave and amplitude were intact. There is moderate interarytenoid pachydermia and post cricoid edema. The mucosa appears without lesions. There was thick yellow mucus along the nasopharynx/supraglottic structures/hypopharynx   The laryngoscope was then slowly withdrawn and the patient tolerated the procedure well. There were no complications or blood loss.    Studies Reviewed:CT chest 09/24/22 IMPRESSION: 1. Progressive diffuse peribronchovascular ground-glass, nodularity and consolidation with scattered septal thickening, findings which may be due to a worsening atypical/fungal infectious process. Lymphangitic carcinomatosis is not excluded. 2. Borderline enlarged mediastinal lymph nodes, possibly reactive. Recommend continued attention on follow-up as malignancy cannot be excluded. 3. Coronary artery calcification  MRI neck 09/15/22 MRI OF THE NECK WITH CONTRAST   TECHNIQUE: Multiplanar, multisequence MR imaging was performed following the administration of intravenous contrast.   CONTRAST:  7mL GADAVIST GADOBUTROL 1 MMOL/ML IV SOLN   COMPARISON:  PET-CT 09/04/2022, CT neck 03/13/2021   FINDINGS: Pharynx and larynx: The nasal cavity and nasopharynx  are unremarkable.   There is no abnormality in the tongue to correspond to the area of ill-defined radiotracer uptake seen on the recent PET-CT. There is no mass lesion or abnormal enhancement. There is no evidence of mass lesion or abnormal enhancement in the region of either tonsil. The oropharynx is unremarkable. Ill-defined T2 hyperintensity in the left parapharyngeal space with mild non masslike enhancement likely reflects posttreatment change, corresponding to ill-defined stranding seen on the CT neck going back to 2019.   The hypopharynx and larynx are unremarkable. The vocal folds are normal in appearance   There is no retropharyngeal fluid collection.  The airway is patent.   Salivary glands: The parotid and submandibular glands are unremarkable.   Thyroid: Unremarkable.   Lymph nodes: There is no pathologic lymphadenopathy in the neck.   Vascular: The major flow voids are normal.   Limited intracranial: Unremarkable.   Visualized orbits: Unremarkable.   Mastoids and visualized paranasal sinuses: There is mucosal thickening in the maxillary sinuses,  left worse than right. There is a new right mastoid effusion.   Skeleton: There is multilevel degenerative change of the cervical spine with disc space narrowing most advanced at C4-C5. There is moderate spinal canal stenosis at C2-C3 through C4-C5.   Upper chest: The imaged lung apices are grossly unremarkable.   Other: None.   IMPRESSION: 1. No evidence of recurrent disease or pathologic lymphadenopathy in the neck. Specifically, there is no suspicious finding in the tongue to correspond to the focus of ill-defined radiotracer uptake on the recent PET-CT. NIRADS 1. 2. Right mastoid effusion, nonspecific.  4//25/24 PET/CT  IMPRESSION: 1. Asymmetric focus of increased radiotracer uptake is identified within the left posterior tongue. Etiology indeterminate. Cannot exclude recurrent tumor. Consider further  evaluation with direct visualization and/or contrast enhanced soft tissue neck CT or MRI. 2. No tracer avid cervical lymph nodes. 3. Heterogeneous, multifocal asymmetric increased uptake is identified within the left lower lobe. On the corresponding nondiagnostic CT images there is extensive tree-in-bud nodularity, thickening of the peribronchovascular interstitium, mucoid impaction, and atelectasis/airspace consolidation. Imaging findings are more likely to reflect a inflammatory/infectious process. Suspect chronic recurrent aspiration. Advise clinical correlation for signs/symptoms aspiration. Additionally, follow-up imaging with diagnostic CT of the chest in 3 months is recommended to ensure resolution and to exclude a central obstructing lesion which could also account for these changes. 4. Mildly tracer avid lymph nodes within the mediastinum and left hilum. In the setting of inflammation/infection these are likely reactive. Metastatic adenopathy is considered less favored but cannot be excluded with a high degree of certainty in this patient who has a history of known malignancy. Attention to these lymph nodes on follow-up imaging is advised. 5. Diffuse tree-in-bud nodularity is also identified within the basilar right upper lobe, right middle lobe and right lower lobe. This is compatible with an inflammatory/infectious bronchiolitis and may be seen with recurrent aspiration. 6. Coronary artery calcifications. 7. Prostate gland enlargement. 8. Asymmetric opacification of the left maxillary sinus. Correlate for any clinical signs or symptoms of sinusitis.     Assessment/Plan: Encounter Diagnoses  Name Primary?   Decreased hearing of right ear Yes   Dysfunction of right eustachian tube    Oropharyngeal dysphagia    Vocal fold paralysis, left    Velopharyngeal insufficiency, acquired    Paralysis of left side of tongue    History of oropharyngeal cancer    Aspiration into  airway, initial encounter    History of radiation to head and neck region    Dysphagia, unspecified type    Glottic insufficiency    Vocal fold atrophy     62 year old male with history of cXRT for stage IVb left oropharyngeal cancer in 2017, who is here for initial consultation with me due to severe dysphagia primarily in oropharynx and dysarthria for approximately 1 year and recurrent aspiration pneumonia.  Had total of 4 aspiration pneumonia episodes and was seen by pulmonary with recommendation to follow with SLP, and limit antibiotic use.  Repeat MBS in June 2024 with evidence of pharyngeal dysphagia and aspiration.  Underwent PEG placement, and working with speech, liquid only intake by mouth and boost supplementation via PEG.  He reports significant decline in speech and swallowing about a year ago, and prior to that had normal speech and was tolerating regular diet without issues.   On my exam today I was able to do evaluation of UES/upper esophagus and post-cricoid area in the office advancing flexible scope passed postcricoid area- no stricture seen.  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  There was significant pooling of secretions along the vallecula bilaterally.  There was evidence of left cranial nerve XII deficit, and diffuse tongue fasciculations.   I discussed exam findings with the patient.  My main concern is due to acute worsening of both speech/tongue mobility and evidence of diffuse tongue fasciculations, neurologic condition such as ALS needs to be excluded.  Other neurologic processes such as those affecting bulbar function also have to be ruled out.  Patient's severe pharyngeal dysphagia could still be related to sequela of radiation, but acute changes in his swallowing function are certainly suspicious for another cause.    I advised the patient to see his neurologist Dr Kate Sable at [facility] in October as scheduled and will communicate with his neurologist to ensure above concerns are addressed during his follow-up  Will consider procedural interventions in the future based on neurology evaluation and for further workup.  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  He will return after follow-up with his neurologist.  Will do videostrobe when he returns to determine if left vocal fold paralysis results in severe glottic insufficiency.   Update 03/16/23 He drinks milk shakes for pleasure, but is still PEG dependent. He is not sure if swallow therapy is helping.  He had multiple aspiration PNA's in the past, last one Feb 2024 No fever or dyspnea at home.  Post-XRT sequelae, L VF paralysis chronic dysphonia and dysphagia PEG dependence 2/2 aspiration PNA - videostrobe today with incomplete glottic closure and evidence of thick yellow secretions in hypo/oropharynx and along naso pharyngeal wall - Augmentin suspension BID x 2 weeks and steroid pack (Medrol dose pack)  2. Chronic nasal congestion and right sided middle ear effusion - Augmentin/Medrol pack as above  - nasal saline rinses with NeilMed - Flonase 2 puffs b/l nares BID  3. Chronic dysphagia and aspiration PEG/Dependent  -We discussed at length that a lot of his swallowing problems are due to postradiation side effects.  We also discussed that there could be a stricture above the level of UES and those are common in head neck cancer survivors.  -Glottic insufficiency and vocal fold atrophy with left vocal fold paralysis potentially contribute to his risk of aspiration -Additionally evidence of bilateral VPI weakens the strength of swallow at the level of the pharynx -We  discussed the trial of vocal fold injection augmentation and EGD balloon dilation of PES, possible injection augmentation for VPI and he would like to proceed -I explained to the patient that procedure may not fix his trouble with swallowing but could help him resume p.o. -We also discussed the need to get neurology evaluation to ensure he does not have any underlying neurologic conditions such as ALS due to history of normal swallowing after radiation and acute onset trouble with swallowing approximately a year ago -Will schedule for DML rigid bronchoscopy bilateral vocal fold injection augmentation, possible posterior pharyngeal wall soft palate injection augmentation, EGD and balloon dilation  4.  Eustachian tube dysfunction right side, recurrent issue per patient.  He reports poor hearing on the left side, and right side is his better ear -Will order audiogram to evaluate hearing and middle  ear function -If evidence of fluid on tympanograms will consider tube placement on the right side at the time of his procedure  5. Chronic GERD LPR - continue Pepcid   Ashok Croon, MD Otolaryngology [facility] Phone: [phone] Fax: [phone]  03/17/2023, 1:37 PM

## 2023-03-17 NOTE — Telephone Encounter (Signed)
Dr. Elease Hashimoto,  You saw this patient on 03/09/2023. Per office protocol, will you please comment on medical clearance for direct microlaryngoscopy, EGD, possible balloon dilation, vocal fold augmentation? I see he is scheduled for echo on 11/25. Will he need to have that completed prior to clearance?   Please route your response to P CV DIV Preop. I will communicate with requesting office once you have given recommendations.   Thank you!  Carlos Levering, NP

## 2023-03-19 ENCOUNTER — Telehealth (INDEPENDENT_AMBULATORY_CARE_PROVIDER_SITE_OTHER): Payer: Self-pay

## 2023-03-19 NOTE — Telephone Encounter (Signed)
   Patient Name: Todd [name]  DOB: November 06, 1960 MRN: [id number]  Primary Cardiologist: None  Chart reviewed as part of pre-operative protocol coverage. Given past medical history and time since last visit, based on ACC/AHA guidelines, Todd [name] is at acceptable risk for the planned procedure without further cardiovascular testing.  Per Dr. Elease Hashimoto patient will not need to wait for echocardiogram to be completed prior to procedure.  The patient was advised that if he develops new symptoms prior to surgery to contact our office to arrange for a follow-up visit, and he verbalized understanding.  I will route this recommendation to the requesting party via Epic fax function and remove from pre-op pool.  Please call with questions.  Napoleon Form, Leodis Rains, NP 03/19/2023, 9:15 AM

## 2023-03-19 NOTE — Telephone Encounter (Signed)
[facility],  Per the office NP, due to past hx of ACC/AHA, the patient is at acceptable risk for surgery without further cardiovascular testing, Per Dr. Melburn Popper, patient will not need to wait for echo to be done prior to surgery.  If patient develops symptoms he was instructed to call their office.

## 2023-03-23 ENCOUNTER — Ambulatory Visit: Payer: BC Managed Care – PPO | Attending: Radiation Oncology

## 2023-03-23 DIAGNOSIS — R471 Dysarthria and anarthria: Secondary | ICD-10-CM | POA: Insufficient documentation

## 2023-03-23 DIAGNOSIS — R1312 Dysphagia, oropharyngeal phase: Secondary | ICD-10-CM | POA: Insufficient documentation

## 2023-03-23 DIAGNOSIS — M5412 Radiculopathy, cervical region: Secondary | ICD-10-CM | POA: Diagnosis not present

## 2023-03-23 NOTE — Therapy (Signed)
OUTPATIENT SPEECH LANGUAGE PATHOLOGY SWALLOW TREATMENT   Patient Name: Todd [name] MRN: [id number] DOB:[birth year], 61 y.o., male Today's Date: 03/23/2023  PCP: Hiram Comber, MD REFERRING PROVIDER: Lonie Peak, MD  END OF SESSION:  End of Session - 03/23/23 1233     Visit Number 12    Number of Visits 17    Date for SLP Re-Evaluation 05/21/23    SLP Start Time 1106    SLP Stop Time  1146    SLP Time Calculation (min) 40 min    Activity Tolerance Patient tolerated treatment well                     Past Medical History:  Diagnosis Date   Acquired hypothyroidism    secondary to radiation //   followed by pcp   Arthritis    shoulders, neck, knees, feet   Bilateral sensorineural hearing loss    due to radiation   Decreased range of motion of neck    due to fibrotic changes of 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   Denervation atrophy of muscle    tongue ,  chronic ,  due to radiation injury   (neurologist-- dr aKate Sable ([facility])  EMG in care everywhere done 03-11-2022   Frequency of urination    GERD (gastroesophageal reflux disease)    History of cancer chemotherapy 2017   tonsil cancer   03-24-2016  to 05-06-2016   History of external beam radiation therapy    03-24-2016  to 05-15-2016  Left Tonsil and Bilateral Neck  @ [facility]   Hoarseness, chronic    due to radiation   Hyperlipidemia, mixed    Hypertension    Inguinal hernia, left    Lower facial weakness    intermittant left lower facial weakness   Numbness and tingling in left arm    intermittently per pt   Oropharyngeal dysphagia    hx neck radiation for left tonsil cancer ;  Speech therapy, regular diet with precautions swallows okay   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   followed by ENT dr bates/  oncologist--- dr Bertis Ruddy;  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;  completed chemo 05-06-2016 and completed radiation of left tonsil/ bilateral neck 05-15-2016  w/ radiation injury  to tongue (denervation chronic)   Wears glasses    Wears hearing aid in both ears    Past Surgical History:  Procedure Laterality Date   INGUINAL HERNIA REPAIR Left 05/02/2022   Procedure: OPEN LEFT INGUINAL HERNIA REPAIR WITH MESH;  Surgeon: Berna Bue, MD;  Location: [facility];  Service: General;  Laterality: Left;   IR GASTROSTOMY TUBE MOD SED  11/07/2022   IR GASTROSTOMY TUBE REMOVAL  01/30/2017   IR GENERIC HISTORICAL  03/20/2016   IR US GUIDE VASC ACCESS RIGHT 03/20/2016 Simonne Come, MD [facility]   IR GENERIC HISTORICAL  03/20/2016   IR FLUORO GUIDE PORT INSERTION RIGHT 03/20/2016 Simonne Come, MD [facility]   IR GENERIC HISTORICAL  03/20/2016   IR GASTROSTOMY TUBE MOD SED 03/20/2016 Simonne Come, MD [facility]   IR REMOVAL TUN ACCESS W/ PORT W/O FL MOD SED  01/30/2017   Patient Active Problem List   Diagnosis Date Noted   Malignant neoplasm of tonsillar fossa (HCC) 01/07/2023   Pulmonary infiltrates 09/08/2022   Hypothyroidism    Right hip pain 12/06/2018   CKD (chronic kidney disease), stage III (HCC) 11/30/2017   Anemia, chronic disease 11/30/2017  Multiple lung nodules on CT 09/08/2016   Pancytopenia, acquired (HCC) 05/26/2016   Deficiency anemia 05/26/2016   Splenic lesion 05/16/2016   Jaw pain 04/08/2016   Essential hypertension 03/18/2016   Weight loss 02/26/2016   Tonsil cancer (HCC) 02/21/2016   Speech Therapy Progress Note  Dates of Reporting Period: 11/24/22  to present  Subjective Statement: Pt has been seen for 11 ST visits focusing on swallowing, Today pt asked for exercises to improve his articulation/intelligibility.  Objective: Pt is now drinking liquids, without overt s/sx aspiration PNA in approx 5 months. He is still PEG tube dependent. See below for more details.   Goal Update: See below  Plan: See below. Suspect 1-2 more visits.  Reason Skilled Services are Required: Ensure pt is safe with POs.     ONSET DATE: 2018    REFERRING DIAG: C09.9 (ICD-10-CM) - Tonsil cancer   THERAPY DIAG:  Dysarthria and anarthria  Dysphagia, oropharyngeal phase  Rationale for Evaluation and Treatment: Rehabilitation  SUBJECTIVE:   SUBJECTIVE STATEMENT: No overt s/sx aspiration PNA today.  Pt accompanied by: significant other  PERTINENT HISTORY: From Dr. Basilio Cairo note May 2024: The patient returns today for follow-up after he approached me with new symptoms at our head and neck annual survivorship celebration.  Mr. Salmons presents for follow up of radiation completed 05/15/16 to his Tonsillar fossa/oropharynx.  Recent PET and MRI below were reviewed at tumor board, NED.  See below. 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  He is also losing weight.  PAIN:  Are you having pain? Yes: NPRS scale: 3/10 Pain location: cervical neck down to mid-back Pain description: annoying pain, makes it hard to sleep Aggravating factors: certain movement Relieving factors: meds  FALLS: Has patient fallen in last 6 months?  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  Had total of 4 aspiration pneumonia episodes and was seen by pulmonary with recommendation to follow with SLP, and limit antibiotic use.  Repeat MBS in June 2024 with evidence of pharyngeal dysphagia and aspiration.   Underwent PEG placement, and working with speech, liquid only intake by mouth and boost supplementation via PEG.  He reports significant decline in speech and swallowing about a year ago, and prior to that had normal speech and was tolerating regular diet without issues.  On my exam today I was able to do evaluation of UES/upper esophagus and post-cricoid area in the office advancing flexible scope passed postcricoid area- no stricture seen.  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  There was significant pooling of secretions along the vallecula bilaterally.  There was evidence of left cranial nerve XII deficit, and diffuse tongue fasciculations.  I discussed exam findings with the patient.  My main concern is due  to acute worsening of both speech/tongue mobility and evidence of diffuse tongue fasciculations, neurologic condition such as ALS needs to be excluded.  Other neurologic processes such as those affecting bulbar function also have to be ruled out.  Patient's severe pharyngeal dysphagia could still be related to sequela of radiation, but acute changes in his swallowing function are certainly suspicious for another cause.  I advised the patient to see his neurologist Dr Kate Sable at [facility] in October as scheduled and will communicate with his neurologist to ensure above concerns are addressed during his follow-up. Will consider procedural interventions in the future based on neurology evaluation and for further workup.  Specifically will consider management of VPI and glottic insufficiency Patient will follow-up with his SLP and continue swallow therapy and aspiration precautions including good oral hygiene and strategies to minimize aspiration.  He will return after follow-up with his neurologist.  Will do videostrobe when he returns to determine  if left vocal fold paralysis results in severe glottic insufficiency.  Update 03/16/23 He drinks milk shakes for pleasure, but is still PEG dependent. He is not sure if swallow therapy is helping.  He had multiple aspiration PNA's in the past, last one Feb 2024 No fever or dyspnea at home. Post-XRT sequelae, L VF paralysis chronic dysphonia and dysphagia PEG dependence 2/2 aspiration PNA - videostrobe today with incomplete glottic closure and evidence of thick yellow secretions in hypo/oropharynx and along naso pharyngeal wall - Augmentin suspension BID x 2 weeks and steroid pack (Medrol dose pack) 2. Chronic nasal congestion and right sided middle ear effusion - Augmentin/Medrol pack as above  - nasal saline rinses with NeilMed - Flonase 2 puffs b/l nares BID 3. Chronic dysphagia and aspiration PEG/Dependent  -We discussed at length that a lot of his swallowing problems are due to postradiation side effects.  We also discussed that there could be a stricture above the level of UES and those are common in head neck cancer survivors.  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 -We also discussed the need to get neurology evaluation to ensure he does not have any underlying neurologic conditions such as ALS due to history of normal swallowing after radiation and acute onset trouble with swallowing approximately a year ago -Will schedule for DML rigid bronchoscopy bilateral vocal fold injection augmentation, possible posterior pharyngeal wall soft  palate injection augmentation, EGD and balloon dilation 4.  Eustachian tube dysfunction right side, recurrent issue per patient.  He reports poor hearing on the left side, and right side is his better ear -Will order audiogram to evaluate hearing and middle ear function -If evidence of fluid on tympanograms will consider tube placement on the right side at the time of his procedure   5. Chronic GERD LPR - continue Pepcid    RESULTS FROM OBJECTIVE SWALLOSW STUDY (MBS)-  10/30/22:  Clinical Impression: Patient presents with mild oral and severe pharyngo-cervical esophageal dysphagia presumed due to iatrogenic effects of XRT that is significantly worse compared to prior MBS conducted by this SLP in 2018. SLP reviewed note from [facility]  at [facility] in 2023/11 but could not view images. 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 Head tilt to the right attempted to aid oral transiting/control with partial effectiveness. Pharyngeal swallow marked by sevfere hypomotility resulting in minimal transiting of barium into esophagus and laryngeal penetration/aspiration. Mulitple dry swallows *pt initiated* is helpful to decrease amount of retention but does not fuly clear it, even after 5-6 swallows with small single liquid bolus. Head of chair reclined to approx 45* with slight neck extension did decrease amount of penetration and aspiration as gravity faciliated retention of barium in posterior pharynx. Even in posterior position and with use of right tongue for oral transiting, pt penetrated very small bite of masticated cracker after the swallow as pharyngeal retention spilled into open airway. Cued cough effectively cleared cracker into pharynx for pt to swallow with liquids. Clearance into cervical esophagus was significantly impaired with gross retention of liquids as pyriform sinus. Question if he may benefit from intervention for PES  opening to mitigate his dysphagia - advised he speak to Dr Jenne Pane, ENT, re: potential.   Unfortunately [facility] current dysphagia is profound and given progressive deficit (presumed from XRT effects, fibrosis) for 15 months, prognosis for swallow to return to functional level is guarded at best.    Advised pt consider ordering antichoking device for emergent use.  Recommended diet:PO diet PO Diet Recommendation: Thin liquids (Level 0); Mildly thick liquids (Level 2, nectar thick); Full liquid diet, some soft solids Liquid Administration via: Cup; Straw; Spoon Medication Administration: Other (Comment) (consider suspension). Supervision: Patient able to self-feed Recommended compensations:  Swallowing strategies  : effortful swallow; Multiple dry swallows after each bite/sip; Hard cough after swallowing; Head tilt right during swallowing; Slow rate; Small bites/sips (Recline approx 45* with slight chin elevation), Follow solids with liquids Postural changes: Partially reclined for meals Oral care recommendations: Oral care QID (4x/day) Recommended consults: Consider ENT consultation (? ENT to determine if potential PES treatment may be helpful)   PATIENT REPORTED OUTCOME MEASURES (PROM): EAT-10: Pt returned 12/05/22 and scored 36/40 (lower scores = better QOL)   TODAY'S TREATMENT:                                                                                                                                         DATE:  03/23/23: Noted Dr. Irene Pap appt 03/16/23 and results above. Pt is no longer having POs other than one bottle of water/day, since 03/16/23, due to being extra careful about surgery date. SLP told pt he can do this but should cont with the HEP for swallowing once/day. HEP completed with independence. Pt with consistent throat clear with liquids however is doing this for safety. Speech tx: Pt should cont HEP for speech and if he notes his speech beginning to be more dysarthric  during HEP, he should stop and wait 30 minutes  to complete the HEP. Today he completed the HEP with specific difficulty articulating velar (k,g) and liquids (for example, s, sh, z). Pt with EGD balloon dilation and vocal fold injection sx 04/14/23 so next visit after that on 04/20/23.   02/20/23:  Swallow: Next session, pt can reduce HEP to x2/week. Pt currently having thin to nectar liquids. No overt s/sx aspiration PNA today. Pt with f/u with neurology (Dr. Lonna Cobb - [facility]) 03/12/23. He was independent with HEP. Is going to protrude tongue more with Masako from now on. With POs - pt had drinkable yogurt today with occasional throat clearing, pt stated due mostly to feeling of residue. SLP postulates pt with more frequent s/sx velopharyngeal incompetence during swallowing. Pt admitted to taking larger sips. SLP encouraged smaller sips.  Pt states his speech is more slurred after talking. SLP questions some neurological basis worsening pt's overall swallowing and speech ability. Follow up with neurologist 03/12/23. Pt and SLP agree pt could return in 4 weeks instead of 2. Speech: SLP discussed and educated pt and wife re: speech compensations, and provided a handout of articulatory HEP and reviewed with pt and wife. Pt to begin this asap for speech compensations.   01/23/23: Pt saw Dr. Irene Pap 01/19/23: Assessment/plan above under "Objective findings". SLP discussed that pt should perform HEP until mid-October. No plan to have follow up MBS at this time due to pt performance in ST sessions relatively unchanged in last 2-3 sessions. With POs (wet puree and water), wet voice intermittently, pt using safe swallow compensations with success. No overt s/sx aspiration PNA at this time. SLP told pt to cont with current solid consistency - said could have puree soups (homogenous consistency) and broths - teaspoons.  Pt completed HEP with independence. Pt agrees with SLP his condition is currently stable and  will cont to be seen every other week for two months. SLP questions if pt's swallowing will improve with improved velopharyngeal closure, and the role possible glottic insufficiency may be playing in efficacy/efficiency of pt's swallow.  01/16/23: Pt to see Dr. Irene Pap 01/19/23. Pt performed oral care prior to ST today. SLP plan is to have pt perform dysphagia HEP for approx 10 weeks prior to [facility] (which will be end of September). Pt appears to cont to gain swallow strength week after week. With POs, >75% of the time [place] had audible swallow, possibly insufficient velopharyngeal closure, as pt again (as in previous session) sniffed after swallowing water. To this point, pt has remained free of overt s/sx aspiration PNA and has followed safe swallow strategies in sessions, sometimes with rare initial reminder to cough/clear throat after 2nd or 3rd swallow with each bolus. Wet voice heard approx 50% of the time with yogurt and water, mostly cleared with multiple swallows. 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  Pt was independent with HEP.   01/08/23: SLP  educated pt/wife rationale for waiting to schedule MBS/FEES until mid-September or later will give [place] ample time for strength gain in swallowing musculature (at least 8 weeks). No overt s/sx aspiration PNA this session; pt ate small spoons of baby food yams, and drank water. SLP had to provide initial cue to cough after a bite of yams (pt was inconsistent) but then [place] cont'd with consistent coughing after last swallow of bolus. PT to cont with solid POs and liquid POs at home. Appointment with Dr. Irene Pap is 01/19/23. Min A with HEP for tongue protrusion  on Masako. Will keep pt once/week to monitor for s/sx PNA and to possibly upgrade pt to regular pureed (instead of baby food) after assessment in-session.  01/01/23: Pt reports difficult to get trigger on first 1-3 reps of Masako and supraglottic but  once he passes these number of reps - no difficulty with triggering swallow. No problem at all triggering effortful swallow from rep #1 on. Today pt did not have difficulty with triggering swallows with Masako. Although lingual protrusion was adequate, SLP told pt to protrude tongue a bit further for the first 2 reps and he could do so. SLP told pt protruding tongue to that length was preferable to the length he currently uses, so he should try to have more reps with further protrusion. Last session, SLP added dynamic Shaker to pt's regimen, 30 1-second reps at least BID. With POs, pt made through one 4 oz jar in one week. SLP suggested pt use drinkable yogurt, Fair life milk, or Boost as POs as well, as long as pt is using safe swallow techniques from [facility] 10/30/22. Pt req'd cues for a cough after every bite/sip, and SLP provided rationale and reminder that pt's sensation was not  100% when material was penetrated.  12/26/22: No s/sx any aspiration PNA today, nor in this last week at all. Pt cont to perform PO trials with water at home, but coughs with every 3-4th swallow. SLP strongly urged pt to complete cough every swallow based upon MBS results. Pt followed other precautions with independence. SLP trialed baby food applesauce with pt with liquid wash and completed with success. SLP told pt to try 10 boluses as many times/day as desired but always stopping if he feels he has less than 8/10 stamina (10=WNL), and always perform thorough oral care before and after POs. SLP also suggested pt could try baby food applesauce-consistency pudding or yogurt. Pt completed HEP today independently. Due to initiating puree today pt will keep frequency at once/week.   12/19/22: No s/sx any aspiration PNA today, nor in this last week at all. Performed oral care just prior to appointment today. Pt independent with HEP today. Protruded tongue past lips independently. Pt cont to take one bottle of water per day, multiple (x2-3)  sips per bolus with multiple hard swallows and cough if necessary. Instead, SLP told pt to take one sip with two-three hard swallows, cough, reswallow hard. SLP to attempt smooth purees with pt next session -will have been 4 weeks since initiation of HEP and pt without any overt s/sx aspiration PNA at this time.  12/12/22: Pt performed oral care prior to ST. Long discussion about whether or not BaSwallow exam would be safe for pt at this time. Afterwards, SLP emailed SLP who performed MBS with pt and she stated BaSwallow exam might be more difficult to perform safely at this time. Pt performed HEP independently and used safe swallow techniques with sips of water.   12/05/22: Pt independent with HEP today, will use mirror for Masako until extent of tongue protrusion habituated. Pt demo'd strategies with sip water today, independently. Pt and SLP agree pt can reduce to once/week. SLP provided s/sx aspiration PNA and pt with mod I.  12/03/22: Pt has been performing HEP regularly. Dora Sims he performed exercises with initial mod cue for Masako (tongue protrusion).  He drank water without overt s/sx aspiration x3 using safe swallow strategies independently.  11/24/22:  Today SLP explained results of pt's MBS and the rationale for the safe swallow strategies. Pt  demonstrated understanding of each and reports he has been using these with one bottle of water each day. No overt s/sx of aspiration PNA to date with >1 week of PO water. Pt has NOT been performing oral care prior to POs. SLP educated pt re: swallow HEP procedure. Pt performed each exercise and SLP ensured correctness prior to moving to next exercise. Pt req'd min A with strength of cough with supraglottic.  PATIENT EDUCATION: Education details: see "today's treatment" Person educated: Patient and Spouse Education method: Explanation, Demonstration, Verbal cues, and Handouts Education comprehension: verbalized understanding, returned demonstration, verbal  cues required, and needs further education   ASSESSMENT:  CLINICAL IMPRESSION: Patient is a 62 y.o. M who was seen today for treatment of swallowing and speech. Pt swallowed sips water with consistent throat clear, doing so for safety concerns. Pt without overt s/sx aspiration pNA at this time. Please see "today's treatment" for more details. Pt took HEP for speech compensations today and will work on this at home. He does not desire formal ST for speech intelligibility at this time. It should be noted that pt's prognosis after June 2024 MBS, improvement for a functional swallow was GUARDED.   OBJECTIVE IMPAIRMENTS: include dysarthria and dysphagia. These impairments are limiting patient from ADLs/IADLs, effectively communicating at home and in community, and safety when swallowing. Factors affecting potential to achieve goals and functional outcome are co-morbidities and severity of impairments. Patient will benefit from skilled SLP services to address above impairments and improve overall function.  REHAB POTENTIAL: Fair given prognosis from MBS   GOALS: Goals reviewed with patient? Yes  SHORT TERM GOALS: Target date: 12/25/22  Pt will perform HEP with rare min A in two sessions Baseline:12/03/22 Goal status: Met  2.  Pt will follow prescribed safe swallow strategies in 3 sessions Baseline: 12/03/22, 12/12/22 Goal status: Partially met  3.  Pt will perform HEP 6/7 days a week for two weeks, per HEP monitoring method Baseline:  Goal status: MET  4.  Pt and/or wife will ID 3 overt s/sx aspiration PNA with modified independence Baseline:  Goal status: met   LONG TERM GOALS: Target date: 01/30/23; 05/21/23  Pt will perform HEP with rare min A in two sessions Baseline: 12/19/22 Goal status: Met  2.  Pt will follow prescribed safe swallow strategies in 3 sessions, after 12/25/22 Baseline: 01/08/23, 01/16/23 Goal status: met  3.  Pt will undergo follow up MBS when/if clinically  indicated Baseline:  Goal status: DEFERRED  4.  Pt will score higher on PROM than initial score Baseline:  Goal status: DEFERRED, and ongoing  5.   Pt will perform HEP independently in three sessions Baseline: 01/16/23, 01/23/23 Goal status: Met  6.   Pt will remain free of overt s/sx aspiration PNA over three sessions Baseline: 02/20/23 Goal Status: Partially met, and ongoing    PLAN:  SLP FREQUENCY: 1x/month  SLP DURATION: other: 90 days  PLANNED INTERVENTIONS: Aspiration precaution training, Pharyngeal strengthening exercises, Diet toleration management , Environmental controls, Trials of upgraded texture/liquids, Cueing hierachy, Internal/external aids, SLP instruction and feedback, Compensatory strategies, Patient/family education, and Repeat MBS PRN    Flordia Kassem, CCC-SLP 03/23/2023, 12:33 PM

## 2023-03-24 ENCOUNTER — Telehealth: Payer: Self-pay | Admitting: Plastic Surgery

## 2023-03-24 DIAGNOSIS — Z85818 Personal history of malignant neoplasm of other sites of lip, oral cavity, and pharynx: Secondary | ICD-10-CM | POA: Diagnosis not present

## 2023-03-24 DIAGNOSIS — R1310 Dysphagia, unspecified: Secondary | ICD-10-CM | POA: Diagnosis not present

## 2023-03-24 DIAGNOSIS — R1312 Dysphagia, oropharyngeal phase: Secondary | ICD-10-CM | POA: Diagnosis not present

## 2023-03-24 DIAGNOSIS — R634 Abnormal weight loss: Secondary | ICD-10-CM | POA: Diagnosis not present

## 2023-03-24 DIAGNOSIS — Y842 Radiological procedure and radiotherapy as the cause of abnormal reaction of the patient, or of later complication, without mention of misadventure at the time of the procedure: Secondary | ICD-10-CM | POA: Diagnosis not present

## 2023-03-24 NOTE — Telephone Encounter (Signed)
Patients provider Dr. Kate Sable would like to speak you about some sort of cancer treatment.

## 2023-03-27 DIAGNOSIS — M5412 Radiculopathy, cervical region: Secondary | ICD-10-CM | POA: Diagnosis not present

## 2023-03-27 DIAGNOSIS — M542 Cervicalgia: Secondary | ICD-10-CM | POA: Diagnosis not present

## 2023-03-27 DIAGNOSIS — M47812 Spondylosis without myelopathy or radiculopathy, cervical region: Secondary | ICD-10-CM | POA: Diagnosis not present

## 2023-03-27 DIAGNOSIS — Z79899 Other long term (current) drug therapy: Secondary | ICD-10-CM | POA: Diagnosis not present

## 2023-03-30 ENCOUNTER — Telehealth: Payer: Self-pay | Admitting: *Deleted

## 2023-03-30 NOTE — Telephone Encounter (Signed)
CALLED PATIENT TO INFORM OF CT FOR 04-06-23- ARRIVAL TIME- 4:15 PM @ [facility], NO RESTRICTIONS TO SCAN, PATIENT TO RECEIVE RESULTS FROM DR. SQUIRE ON 04-07-23 @ 2:20 PM, LVM FOR A RETURN CALL

## 2023-03-31 ENCOUNTER — Encounter: Payer: Self-pay | Admitting: Hematology and Oncology

## 2023-04-02 ENCOUNTER — Ambulatory Visit (INDEPENDENT_AMBULATORY_CARE_PROVIDER_SITE_OTHER): Payer: BC Managed Care – PPO | Admitting: Audiology

## 2023-04-02 DIAGNOSIS — H90A31 Mixed conductive and sensorineural hearing loss, unilateral, right ear with restricted hearing on the contralateral side: Secondary | ICD-10-CM

## 2023-04-02 DIAGNOSIS — H90A22 Sensorineural hearing loss, unilateral, left ear, with restricted hearing on the contralateral side: Secondary | ICD-10-CM | POA: Diagnosis not present

## 2023-04-02 NOTE — Progress Notes (Addendum)
  [address] [place], [place] [address] [phone]  Audiological Evaluation    Name: Todd [name]     DOB:   04-23-1961      MRN:   [id number]                                                                                     Service Date: 04/02/2023     Accompanied by: wife   Patient comes today after Dr. Irene Pap, ENT sent a referral for a hearing evaluation due to concerns with Eustachian tube dysfunction.   Symptoms Yes Details  Hearing loss  [x]  The left ear used to be his "bad" ear, but now it seems like it is the right  Tinnitus  [x]  Constant in both ears  Ear pain/ Ear infections  [x]  Dr. Irene Pap noticed middle ear effusion, worse in the right ear during the last visit.   Balance problems  [x]  Was told to have orthostatic hypotension - a little lightheaded when he stands up quickly.  Noise exposure  [x]  Music when he was a teenager/young adult  Previous ear surgeries  []    Family history  []    Amplification  [x]  Has a set of 2018 or 2019 Phonak hearing aids.  Other  [x]  Wife helped with medical history. He has difficulty producing speech after he had throat cancer treatment.    Otoscopy: Right ear: Clear external ear canals and notable landmarks visualized on the tympanic membrane. Left ear:  Clear external ear canals and notable landmarks visualized on the tympanic membrane.  Tympanometry: Right ear: Normal external ear canal volume with normal middle ear pressure, broad gradient and borderline normal tympanic membrane compliance. 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 Left ear-  Normal to severe sensorineural hearing loss from 250 Hz - 8000 Hz.  The hearing test results were completed under headphones and re-checked with inserts and results are deemed to be of good  reliability. Test technique:  conventional     Speech Audiometry: Right ear- Speech Reception Threshold (SRT) was obtained at 40 dBHL Left ear-Speech Reception Threshold (SRT) was obtained at 20 dBHL   Word Recognition Score Could not test as patient's articulation was compromised after he had throat cancer.   Impression: There is a significant difference in pure-tone thresholds between ears (air conduction only). Today's test results may seem to be worse, mainly in the right ear when compared Dr. Westly Pam report from 03-04-16. I was unable to find the audiogram for that test.   Recommendations: Follow up with ENT as scheduled for today. Return for a hearing evaluation if concerns with hearing changes arise or per MD recommendation. Repeat audiogram after medical care. Continue hearing aid use, may consider new ones.   Ameliah Baskins MARIE LEROUX-MARTINEZ, AUD

## 2023-04-03 ENCOUNTER — Ambulatory Visit: Payer: Self-pay | Admitting: Radiation Oncology

## 2023-04-06 ENCOUNTER — Ambulatory Visit (HOSPITAL_BASED_OUTPATIENT_CLINIC_OR_DEPARTMENT_OTHER): Payer: BC Managed Care – PPO

## 2023-04-06 ENCOUNTER — Ambulatory Visit (HOSPITAL_COMMUNITY)
Admission: RE | Admit: 2023-04-06 | Discharge: 2023-04-06 | Disposition: A | Discharge status: Home / Self Care | Payer: BC Managed Care – PPO | Source: Ambulatory Visit | Attending: Radiology | Admitting: Radiology

## 2023-04-06 ENCOUNTER — Encounter: Payer: Self-pay | Admitting: Radiation Oncology

## 2023-04-06 DIAGNOSIS — C099 Malignant neoplasm of tonsil, unspecified: Secondary | ICD-10-CM | POA: Diagnosis not present

## 2023-04-06 DIAGNOSIS — I951 Orthostatic hypotension: Secondary | ICD-10-CM

## 2023-04-06 DIAGNOSIS — C090 Malignant neoplasm of tonsillar fossa: Secondary | ICD-10-CM | POA: Insufficient documentation

## 2023-04-06 DIAGNOSIS — G9520 Unspecified cord compression: Secondary | ICD-10-CM | POA: Diagnosis not present

## 2023-04-06 DIAGNOSIS — R918 Other nonspecific abnormal finding of lung field: Secondary | ICD-10-CM | POA: Diagnosis not present

## 2023-04-06 LAB — ECHOCARDIOGRAM COMPLETE
Area-P 1/2: 3.27 cm2
S' Lateral: 3.4 cm

## 2023-04-06 MED ORDER — IOHEXOL 300 MG/ML  SOLN
75.0000 mL | Freq: Once | INTRAMUSCULAR | Status: AC | PRN
  Administered 2023-04-06: 75 mL via INTRAVENOUS

## 2023-04-06 NOTE — Progress Notes (Incomplete Revision)
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  Treatment Completion Date: 05/15/2016 Pain issues, if any: None Using a feeding tube?: Yes Weight changes, if any: None Swallowing issues, if any: No issues drinking fluids. Smoking or chewing tobacco? None Using fluoride toothpaste daily? Yes Last ENT visit was on: 1st week of November 2024 w/ Dr. Irene Pap Other notable issues, if any: none   Vitals: ***  This concludes the interaction.  Ruel Favors, LPN

## 2023-04-06 NOTE — Progress Notes (Signed)
Patient identity verified x2  Patient Samie Sever is here today- 04/06/2023 for follow-up of Radiation to the Tonsillar Fossa.  Treatment Completion Date: 05/15/2016 Pain issues, if any: None Using a feeding tube?: Yes Weight changes, if any: None Swallowing issues, if any: No issues drinking fluids. Smoking or chewing tobacco? None Using fluoride toothpaste daily? Yes Last ENT visit was on: 1st week of November 2024 w/ Dr. Irene Pap Other notable issues, if any: none   Vitals: BP 111/81 (BP Location: Left Arm, Patient Position: Sitting, Cuff Size: Normal)   Pulse 69   Temp (!) 97.1 F (36.2 C) (Temporal)   Resp 18   Ht 5' 9.5" (1.765 m)   Wt 159 lb 8 oz (72.3 kg)   SpO2 99%   BMI 23.22 kg/m   This concludes the interaction.  Ruel Favors, LPN

## 2023-04-06 NOTE — Progress Notes (Signed)
Radiation Oncology         [phone] ________________________________  Name: Todd [name] MRN: [id number]  Date: 04/07/2023  DOB: [birth year]  Follow-Up Visit Note  Outpatient  CC: Tisovec, Adelfa Koh, MD  Tisovec, Adelfa Koh, MD  Diagnosis and Prior Radiotherapy:  No diagnosis found.   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   Narrative:  Todd [name] presents for follow up of radiation completed 05/15/16 to his Tonsillar fossa.   He was experiencing severe dysphagia which promted a follow-up visit approximately 4 months ago. 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 We also referred the patient to [facility] for SLP and arranged a GI consult to rule out esophageal abnormalities. Both his surgeon and gastroenterologist believed his medical condition was too frail to pursue further aggressive treatment or evaluation. He has been seeing [name]. ***  ***                     ALLERGIES:  is allergic to phenergan [promethazine hcl], bactrim [sulfamethoxazole-trimethoprim], and tamsulosin.  Meds: Current Outpatient Medications  Medication Sig Dispense Refill   acetaminophen (TYLENOL) 500 MG tablet Take 1,000 mg by mouth daily. LIQUID FORM ONLY     cetirizine (ZYRTEC) 10 MG tablet Take 10 mg by mouth daily.     Coenzyme Q10 (COQ-10) 200 MG CAPS Take 200 mg by mouth daily.     famotidine (PEPCID) 20 MG tablet Take 20 mg by mouth at bedtime.     fenofibrate (TRICOR) 145 MG tablet Take 145 mg by mouth daily.      fluticasone (FLONASE) 50  MCG/ACT nasal spray Place 2 sprays into both nostrils daily. 16 g 6   glucosamine-chondroitin 500-400 MG tablet Take 2 tablets by mouth every morning. Chewables     levothyroxine (SYNTHROID) 125 MCG tablet Take 125 mcg by mouth daily before breakfast.     meloxicam (MOBIC) 7.5 MG tablet Take 7.5 mg by mouth daily.     No current facility-administered medications for this encounter.    Physical Findings: *** The patient is in no acute distress. Patient is alert and oriented.  vitals were not taken for this visit. .     General: Alert and oriented, in no acute distress, quite hoarse HEENT: Head is normocephalic. Extraocular movements are intact. Oropharynx is clear of tumor but the oral tongue is notable for fasciculations and atrophy and his tongue deviates to the left Neck: Neck is supple, no palpable cervical or supraclavicular lymphadenopathy.  Fibrosis noted bilaterally. Heart: Regular in rate and rhythm with no murmurs, rubs, or gallops. Chest: Clear to auscultation bilaterally, with no rhonchi, wheezes, or rales. Abdomen: Soft, nontender, nondistended, with no rigidity or guarding. PEG tube in place with no signs of infection.  Extremities: No cyanosis or edema. Lymphatics: see Neck Exam Skin: No concerning lesions. Musculoskeletal: Grossly symmetric strength and muscle tone throughout. Neurologic: Cranial nerves II through XII are grossly intact. No obvious focalities. Speech is fluent. Coordination is intact. Psychiatric: Judgment and insight are intact. Affect is appropriate.  ECOG=1  Lab Findings: Lab Results  Component Value Date   WBC  7.3 11/07/2022   HGB 12.7 (L) 11/07/2022   HCT 40.3 11/07/2022   MCV 85.2 11/07/2022   PLT 387 11/07/2022    Radiographic Findings: No results found.  Impression/Plan:  *** This is a patient well-known by me with a history of locally advanced oropharyngeal cancer treated in 2017.  Since we last saw him approximately 4 months ago,  his dysphagia has been slowly improving with the help from SLP. He sees [name] once a week. Recommend continuing to follow with SLP.   He was evaluated by gastroenterology, and his orthopedic spine surgeon per our recommendations. The specialists didn't think he was healthy enough for further evaluation or treatment at that time.   [name] has made a referral to otolaryngologist, Dr. Irene Pap, to help for further evaluation. We also personally messaged her, requesting that she see him. He will benefit from examination of his mouth/pharynx/larynx and esophagus to complete his work-up; the dysphagia is likely a multi-factorial issue.  He may have tongue /nerve damage from the previous tumor and radiation, as well as possibly scar tissue or injury of the throat/esophagus.  It is also possible his esophagus is compressed by his degenerative spine disease.  Patient complains of cold hands and feet today along with low blood pressure. His PCP is working this up this issue, so we will defer this to them.   CT of the neck and chest w/ contrast in 3 months with an office visit to follow and review imaging. Past PET and MRI this year have been reassuring for likely remission. Patient knows to call in the interim with any questions or concerns.   On date of service, in total, I spent 30 minutes on this encounter. Patient was seen in person. Note signed after encounter date; minutes pertain to date of service, only.  _____________________________________   Joyice Faster, PA-C    Lonie Peak, MD

## 2023-04-07 ENCOUNTER — Inpatient Hospital Stay: Payer: BC Managed Care – PPO | Attending: Hematology and Oncology | Admitting: Dietician

## 2023-04-07 ENCOUNTER — Ambulatory Visit
Admission: RE | Admit: 2023-04-07 | Discharge: 2023-04-07 | Disposition: A | Discharge status: Home / Self Care | Payer: BC Managed Care – PPO | Source: Ambulatory Visit | Attending: Radiation Oncology | Admitting: Radiation Oncology

## 2023-04-07 ENCOUNTER — Encounter: Payer: Self-pay | Admitting: Radiation Oncology

## 2023-04-07 ENCOUNTER — Telehealth: Payer: Self-pay

## 2023-04-07 VITALS — BP 111/81 | HR 69 | Temp 97.1°F | Resp 18 | Ht 69.5 in | Wt 159.5 lb

## 2023-04-07 DIAGNOSIS — Z79890 Hormone replacement therapy (postmenopausal): Secondary | ICD-10-CM | POA: Insufficient documentation

## 2023-04-07 DIAGNOSIS — Z79899 Other long term (current) drug therapy: Secondary | ICD-10-CM | POA: Diagnosis not present

## 2023-04-07 DIAGNOSIS — Z923 Personal history of irradiation: Secondary | ICD-10-CM | POA: Diagnosis not present

## 2023-04-07 DIAGNOSIS — Z791 Long term (current) use of non-steroidal anti-inflammatories (NSAID): Secondary | ICD-10-CM | POA: Insufficient documentation

## 2023-04-07 DIAGNOSIS — E039 Hypothyroidism, unspecified: Secondary | ICD-10-CM | POA: Diagnosis not present

## 2023-04-07 DIAGNOSIS — C090 Malignant neoplasm of tonsillar fossa: Secondary | ICD-10-CM | POA: Diagnosis not present

## 2023-04-07 DIAGNOSIS — C099 Malignant neoplasm of tonsil, unspecified: Secondary | ICD-10-CM

## 2023-04-07 DIAGNOSIS — I77819 Aortic ectasia, unspecified site: Secondary | ICD-10-CM

## 2023-04-07 DIAGNOSIS — Z85819 Personal history of malignant neoplasm of unspecified site of lip, oral cavity, and pharynx: Secondary | ICD-10-CM | POA: Diagnosis not present

## 2023-04-07 NOTE — Telephone Encounter (Signed)
Called and spoke with patient's wife on DPR who verbalized understanding and agreed with plan for MRA. Wife mentions that they have met their deductible for this year and if test cannot be done by 05/12/23, doesn't know if they will be able to afford it. Advised I will request this be done by then.

## 2023-04-07 NOTE — Progress Notes (Signed)
Nutrition Follow-up:  Patient completed concurrent chemoradiation for tonsil cancer. Final radiation 05/15/16 under the care of Dr. Basilio Cairo. Patient now with dysphagia and recurrent aspiration pneumonia   Met with patient and wife in office. Patient reports holding on oral intake s/p otolaryngologist appointment 11/4 revealing incomplete glottic closure and thick yellow secretions in oropharynx as well as along naso pharyngeal wall seen on videostrobe. Patient is pending laryngoscopy with bronchoscopy and balloon dilation on 12/3 under the care of Dr. Irene Pap. Patient continues completing swallowing exercises as prescribed per SLP. Patient is tolerating tube feedings at goal. He has been unable to increase total water secondary to abdominal fullness/discomfort.    Medications: reviewed   Labs: no new labs for review  Anthropometrics: Wt 159 lb 8 oz today - trending up   10/30 - 156 lb  10/1 - 153 lb 9/18 - 152 lb 4 oz  8/28 - 147 lb 3 oz  6/28 - 148 lb      Estimated Energy Needs   Kcals: 2576-2898 Protein: 103-116 Fluid: >/= 2.2 L  NUTRITION DIAGNOSIS: Inadequate oral intake continues - pt relying on tube   MALNUTRITION DIAGNOSIS: Severe malnutrition improving   INTERVENTION:  Continue one carton Boost VHC 5x/day via tube  Suggested spreading additional water over the 5 feedings to meet hydration goals Continue HEP per SLP Support and encouragement     MONITORING, EVALUATION, GOAL: wt trends, TF, diet advancement   NEXT VISIT: Friday December 20 in office

## 2023-04-07 NOTE — Pre-Procedure Instructions (Signed)
Surgical Instructions   Your procedure is scheduled on Tuesday, December 3rd. Report to [facility] Main Entrance "A" at 10:30 A.M., then check in with the Admitting office. Any questions or running late day of surgery: call [phone]  Questions prior to your surgery date: call [phone], Monday-Friday, 8am-4pm. If you experience any cold or flu symptoms such as cough, fever, chills, shortness of breath, etc. between now and your scheduled surgery, please notify us at the above number.     Remember:  Do not eat or drink after midnight the night before your surgery    Take these medicines the morning of surgery with A SIP OF WATER  cetirizine (ZYRTEC)  fenofibrate (TRICOR)  fluticasone (FLONASE)  levothyroxine (SYNTHROID)    May take these medicines IF NEEDED: acetaminophen (TYLENOL)    One week prior to surgery, STOP taking any Aspirin (unless otherwise instructed by your surgeon) Aleve, Naproxen, Ibuprofen, Motrin, Advil, Goody's, BC's, meloxicam (MOBIC), all herbal medications, fish oil, and non-prescription vitamins.                     Do NOT Smoke (Tobacco/Vaping) for 24 hours prior to your procedure.  If you use a CPAP at night, you may bring your mask/headgear for your overnight stay.   You will be asked to remove any contacts, glasses, piercing's, hearing aid's, dentures/partials prior to surgery. Please bring cases for these items if needed.    Patients discharged the day of surgery will not be allowed to drive home, and someone needs to stay with them for 24 hours.  SURGICAL WAITING ROOM VISITATION Patients may have no more than 2 support people in the waiting area - these visitors may rotate.   Pre-op nurse will coordinate an appropriate time for 1 ADULT support person, who may not rotate, to accompany patient in pre-op.  Children under the age of 12 must have an adult with them who is not the patient and must remain in the main waiting area with an  adult.  If the patient needs to stay at the hospital during part of their recovery, the visitor guidelines for inpatient rooms apply.  Please refer to the [facility] website for the visitor guidelines for any additional information.   If you received a COVID test during your pre-op visit  it is requested that you wear a mask when out in public, stay away from anyone that may not be feeling well and notify your surgeon if you develop symptoms. If you have been in contact with anyone that has tested positive in the last 10 days please notify you surgeon.      Pre-operative CHG Bathing Instructions   You can play a key role in reducing the risk of infection after surgery. Your skin needs to be as free of germs as possible. You can reduce the number of germs on your skin by washing with CHG (chlorhexidine gluconate) soap before surgery. CHG is an antiseptic soap that kills germs and continues to kill germs even after washing.   DO NOT use if you have an allergy to chlorhexidine/CHG or antibacterial soaps. If your skin becomes reddened or irritated, stop using the CHG and notify one of our RNs at [phone].              TAKE A SHOWER THE NIGHT BEFORE SURGERY AND THE DAY OF SURGERY    Please keep in mind the following:  DO NOT shave, including legs and underarms, 48 hours prior to  surgery.   You may shave your face before/day of surgery.  Place clean sheets on your bed the night before surgery Use a clean washcloth (not used since being washed) for each shower. DO NOT sleep with pet's night before surgery.  CHG Shower Instructions:  Wash your face and private area with normal soap. If you choose to wash your hair, wash first with your normal shampoo.  After you use shampoo/soap, rinse your hair and body thoroughly to remove shampoo/soap residue.  Turn the water OFF and apply half the bottle of CHG soap to a CLEAN washcloth.  Apply CHG soap ONLY FROM YOUR NECK DOWN TO YOUR TOES (washing  for 3-5 minutes)  DO NOT use CHG soap on face, private areas, open wounds, or sores.  Pay special attention to the area where your surgery is being performed.  If you are having back surgery, having someone wash your back for you may be helpful. Wait 2 minutes after CHG soap is applied, then you may rinse off the CHG soap.  Pat dry with a clean towel  Put on clean pajamas    Additional instructions for the day of surgery: DO NOT APPLY any lotions, deodorants, cologne, or perfumes.   Do not wear jewelry or makeup Do not wear nail polish, gel polish, artificial nails, or any other type of covering on natural nails (fingers and toes) Do not bring valuables to the hospital. [facility] is not responsible for valuables/personal belongings. Put on clean/comfortable clothes.  Please brush your teeth.  Ask your nurse before applying any prescription medications to the skin.

## 2023-04-07 NOTE — Telephone Encounter (Signed)
-----   Message from Kristeen Miss sent at 04/06/2023  5:33 PM EST ----- Normal LV EF . 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

## 2023-04-08 ENCOUNTER — Other Ambulatory Visit: Payer: Self-pay

## 2023-04-08 ENCOUNTER — Encounter (HOSPITAL_COMMUNITY)
Admission: RE | Admit: 2023-04-08 | Discharge: 2023-04-08 | Disposition: A | Discharge status: Home / Self Care | Payer: BC Managed Care – PPO | Source: Ambulatory Visit | Attending: Otolaryngology | Admitting: Otolaryngology

## 2023-04-08 ENCOUNTER — Encounter (HOSPITAL_COMMUNITY): Payer: Self-pay

## 2023-04-08 VITALS — BP 109/68 | HR 72 | Temp 97.8°F | Resp 18 | Ht 69.5 in | Wt 160.0 lb

## 2023-04-08 DIAGNOSIS — Z01812 Encounter for preprocedural laboratory examination: Secondary | ICD-10-CM | POA: Diagnosis not present

## 2023-04-08 DIAGNOSIS — H9192 Unspecified hearing loss, left ear: Secondary | ICD-10-CM | POA: Insufficient documentation

## 2023-04-08 DIAGNOSIS — E785 Hyperlipidemia, unspecified: Secondary | ICD-10-CM | POA: Diagnosis not present

## 2023-04-08 DIAGNOSIS — Z85818 Personal history of malignant neoplasm of other sites of lip, oral cavity, and pharynx: Secondary | ICD-10-CM | POA: Insufficient documentation

## 2023-04-08 DIAGNOSIS — I2510 Atherosclerotic heart disease of native coronary artery without angina pectoris: Secondary | ICD-10-CM | POA: Insufficient documentation

## 2023-04-08 DIAGNOSIS — R1312 Dysphagia, oropharyngeal phase: Secondary | ICD-10-CM | POA: Diagnosis not present

## 2023-04-08 DIAGNOSIS — K219 Gastro-esophageal reflux disease without esophagitis: Secondary | ICD-10-CM | POA: Diagnosis not present

## 2023-04-08 DIAGNOSIS — E039 Hypothyroidism, unspecified: Secondary | ICD-10-CM | POA: Diagnosis not present

## 2023-04-08 DIAGNOSIS — Z01818 Encounter for other preprocedural examination: Secondary | ICD-10-CM

## 2023-04-08 DIAGNOSIS — I10 Essential (primary) hypertension: Secondary | ICD-10-CM | POA: Insufficient documentation

## 2023-04-08 DIAGNOSIS — J3801 Paralysis of vocal cords and larynx, unilateral: Secondary | ICD-10-CM | POA: Insufficient documentation

## 2023-04-08 HISTORY — DX: Orthostatic hypotension: I95.1

## 2023-04-08 HISTORY — DX: Hyperlipidemia, unspecified: E78.5

## 2023-04-08 LAB — CBC
HCT: 40 % (ref 39.0–52.0)
Hemoglobin: 13.1 g/dL (ref 13.0–17.0)
MCH: 29.4 pg (ref 26.0–34.0)
MCHC: 32.8 g/dL (ref 30.0–36.0)
MCV: 89.9 fL (ref 80.0–100.0)
Platelets: 258 K/uL (ref 150–400)
RBC: 4.45 MIL/uL (ref 4.22–5.81)
RDW: 13.9 % (ref 11.5–15.5)
WBC: 3.4 K/uL — ABNORMAL LOW (ref 4.0–10.5)
nRBC: 0 % (ref 0.0–0.2)

## 2023-04-08 LAB — BASIC METABOLIC PANEL
Anion gap: 9 (ref 5–15)
BUN: 38 mg/dL — ABNORMAL HIGH (ref 8–23)
CO2: 31 mmol/L (ref 22–32)
Calcium: 10 mg/dL (ref 8.9–10.3)
Chloride: 100 mmol/L (ref 98–111)
Creatinine, Ser: 1.54 mg/dL — ABNORMAL HIGH (ref 0.61–1.24)
GFR, Estimated: 51 mL/min — ABNORMAL LOW (ref >60)
Glucose, Bld: 135 mg/dL — ABNORMAL HIGH (ref 70–99)
Potassium: 4 mmol/L (ref 3.5–5.1)
Sodium: 140 mmol/L (ref 135–145)

## 2023-04-08 NOTE — Progress Notes (Signed)
PCP - Dr. Guerry Bruin Cardiologist - Dr. Kristeen Miss  PPM/ICD - denies   Chest x-ray - 09/24/22- [facility]- [facility]  EKG - 12/09/22 Stress Test - denies ECHO - 04/06/23 Cardiac Cath - denies  Sleep Study - denies   DM- denies  Last dose of GLP1 agonist-  n/a   ASA/Blood Thinner Instructions: n/a   ERAS Protcol - no, NPO   COVID TEST- n/a   Anesthesia review: yes, cardiac hx. Pt has G-tube and has been told to hold tube feedings starting at midnight before surgery  Patient denies shortness of breath, fever, cough and chest pain at PAT appointment   All instructions explained to the patient, with a verbal understanding of the material. Patient agrees to go over the instructions while at home for a better understanding.  The opportunity to ask questions was provided.

## 2023-04-10 NOTE — Progress Notes (Signed)
Anesthesia Chart Review:  Case: [id number] Date/Time: 04/14/23 1215   Procedures:      LARYNGOSCOPY WITH BRONCHOSCOPY AND BALLOON DILATION     MICROLARYNGOSCOPY WITH VOCAL CORD INJECTION (Bilateral)     RIGID BRONCHOSCOPY     MYRINGOTOMY WITH TUBE PLACEMENT (Right)   Anesthesia type: Choice   Pre-op diagnosis: Decreased hearing of right ear,Oropharyngeal dysphagia, Vocal fold paralysis, left   Location: [facility] ROOM 09 / [facility]   Surgeons: Ashok Croon, MD       DISCUSSION: Patient is a 62 year old male scheduled for the above procedure.  History includes never smoker, HTN (and orthostatic hypotension), HLD, left tonsillar cancer (stage IVB SCC, HPV negative, s/p chemoradiation 2017; s/p G-tube 03/20/16-03/20/16, replacement 11/07/22 for supplementation), post-radiation hypothyroidism, GERD, chronic hoarseness (post radiation), sensorineural hearing loss (post radiation, uses hearing aids), paraesthesias (LUE).  He is followed by [facility]rologist Dr. 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 Also some LUE paresthesias. 02/2022 EMG consistent with left carpal tunnel syndrome. Chronic denervation changes in the genioglossus were felt most consistent with radiation effects, no evidence of motor neuron disease. 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 He saw "Dr. Andria Meuse in neurosurgery who did not feel surgical intervention would be safe with his existing scar tissue or improve his symptoms. He did get an injection though which helped." Per last visit on 03/23/23, Dr. Kate Sable wrote, "Unfortunately he has more hypernasal dysarthria and more tongue atrophy today, his weakness worsened to the point where he needed a PEG tube over the summer. He is doing better with this, and his arm weakness and numbness improved after her cervical spine injections. 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 This is still possibly related to the radiation field, but at this point his voice sounds more like a patient with ALS. Will discuss with our ALS clinic and consider having him go for a second opinion. In the meantime, he can continue following with ENT and undergo the procedure they are planning in December. Will plan for follow-up here in 4 months, sooner if needed."  Last radiation oncology visit was on 04/07/23 with Dr. Basilio Cairo. She noted plans for ENT procedure.     He was evaluated by cardiologist Dr. Elease Hashimoto for HLD and orthostatic hypotension (drops as low as 60/40) on 03/09/23. , had been prescribed Florinef). Hypovolemia from weight loss with decreased intake from tonsillar cancer likely contributing. He was getting IV[facility] at [facility] and using Boost supplement via PEG tube. Echo ordered.  - Preoperative cardiology input outlined by Neila Gear, Np  on 03/19/23, "Given past medical history and time since last visit, based on ACC/AHA guidelines, Asmir Lauersdorf is at acceptable risk for the planned procedure without further cardiovascular testing.  Per Dr. Elease Hashimoto patient will not need to wait for echocardiogram to be completed prior to procedure..."  Echo on 04/06/23 showed LVEF 55-60%, no regional wall motion abnormalities, normal RV systolic function, aortic root 41 mm. MRA in the future planned to better assess thoracic aorta.   Anesthesia team to evaluate on the day of surgery.   VS: BP 109/68   Pulse 72   Temp 36.6 C (Oral)   Resp 18   Ht 5' 9.5" (1.765 m)   Wt 72.6 kg   SpO2 100%  BMI 23.29 kg/m    PROVIDERS: Tisovec, Adelfa Koh, MD is PCP Kristeen Miss, MD is cardiologist Lonie Peak, MD is RAD-ONC Artis Delay, MD is Ardelia Mems, MD is neurologist   LABS: Labs reviewed: Acceptable for surgery. Cr 1.54, previously 1.57 on 03/10/23, which was up from 1.1 on 11/07/22 and  volume depletion thought to be contributing and has been getting IVF and Boost supplements.  Prescribed Florinef. (all labs ordered are listed, but only abnormal results are displayed)  Labs Reviewed  BASIC METABOLIC PANEL - Abnormal; Notable for the following components:      Result Value   Glucose, Bld 135 (*)    BUN 38 (*)    Creatinine, Ser 1.54 (*)    GFR, Estimated 51 (*)    All other components within normal limits  CBC - Abnormal; Notable for the following components:   WBC 3.4 (*)    All other components within normal limits     IMAGES: CT Chest 04/06/23: In process.   CT Soft Tissue Neck 04/05/23: In process.   CT Chest 09/24/22: IMPRESSION: 1. Progressive diffuse peribronchovascular ground-glass, nodularity and consolidation with scattered septal thickening, findings which may be due to a worsening atypical/fungal infectious process. Lymphangitic carcinomatosis is not excluded. 2. Borderline enlarged mediastinal lymph nodes, possibly reactive. Recommend continued attention on follow-up as malignancy cannot be excluded. 3. Coronary artery calcification.   MRI Neck Soft Tissue 09/15/22: IMPRESSION: 1. No evidence of recurrent disease or pathologic lymphadenopathy in the neck. Specifically, there is no suspicious finding in the tongue to correspond to the focus of ill-defined radiotracer uptake on the recent PET-CT. NIRADS 1. 2. Right mastoid effusion, nonspecific.  PET Scan 09/04/22: IMPRESSION: 1. Asymmetric focus of increased radiotracer uptake is identified within the left posterior tongue. Etiology indeterminate. Cannot exclude recurrent tumor. Consider further evaluation with direct visualization and/or contrast enhanced soft tissue neck CT or MRI. 2. No tracer avid cervical lymph nodes. 3. Heterogeneous, multifocal asymmetric increased uptake is identified within the left lower lobe. On the corresponding nondiagnostic CT images there is extensive  tree-in-bud nodularity, thickening of the peribronchovascular interstitium, mucoid impaction, and atelectasis/airspace consolidation. Imaging findings are more likely to reflect a inflammatory/infectious process. Suspect chronic recurrent aspiration. Advise clinical correlation for signs/symptoms aspiration. Additionally, follow-up imaging with diagnostic CT of the chest in 3 months is recommended to ensure resolution and to exclude a central obstructing lesion which could also account for these changes. 4. Mildly tracer avid lymph nodes within the mediastinum and left hilum. In the setting of inflammation/infection these are likely reactive. Metastatic adenopathy is considered less favored but cannot be excluded with a high degree of certainty in this patient who has a history of known malignancy. Attention to these lymph nodes on follow-up imaging is advised. 5. Diffuse tree-in-bud nodularity is also identified within the basilar right upper lobe, right middle lobe and right lower lobe. This is compatible with an inflammatory/infectious bronchiolitis and may be seen with recurrent aspiration. 6. Coronary artery calcifications. 7. Prostate gland enlargement. 8. Asymmetric opacification of the left maxillary sinus. Correlate for any clinical signs or symptoms of sinusitis.    EKG: 12/09/22: NSR   CV: Echo 04/06/23: IMPRESSIONS   1. Left ventricular ejection fraction, by estimation, is 55 to 60%. The  left ventricle has normal function. The left ventricle has no regional  wall motion abnormalities. Left ventricular diastolic parameters were  normal.   2. Right ventricular systolic function is normal. The right ventricular  size is  normal.   3. The mitral valve is normal in structure. No evidence of mitral valve  regurgitation. No evidence of mitral stenosis.   4. The aortic valve is tricuspid. Aortic valve regurgitation is not  visualized. No aortic stenosis is present.   5.  Aortic dilatation noted. There is mild dilatation of the aortic root,  measuring 41 mm.   6. The inferior vena cava is normal in size with greater than 50%  respiratory variability, suggesting right atrial pressure of 3 mmHg.   Past Medical History:  Diagnosis Date   Acquired hypothyroidism    secondary to radiation //   followed by pcp   Arthritis    shoulders, neck, knees, feet   Bilateral sensorineural hearing loss    due to radiation   Decreased range of motion of neck    due to fibrotic changes of 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   Denervation atrophy of muscle    tongue ,  chronic ,  due to radiation injury   (neurologist-- dr a. Kate Sable ([facility] [place])  EMG in care everywhere done 03-11-2022   Frequency of urination    GERD (gastroesophageal reflux disease)    History of cancer chemotherapy 2017   tonsil cancer   03-24-2016  to 05-06-2016   History of external beam radiation therapy    03-24-2016  to 05-15-2016  Left Tonsil and Bilateral Neck  @ [facility]   HLD (hyperlipidemia)    Hoarseness, chronic    due to radiation   Hyperlipidemia, mixed    Hypertension    pt states he has not had HTN since June 2024 (he has not taken BP meds since then since his BP has been on the lower side)   Inguinal hernia, left    Lower facial weakness    intermittant left lower facial weakness   Numbness and tingling in left arm    intermittently per pt   Oropharyngeal dysphagia    hx neck radiation for left tonsil cancer ;  Speech therapy, regular diet with precautions swallows okay   Orthostatic hypotension    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   followed by ENT dr bates/  oncologist--- dr Bertis Ruddy;  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;  completed chemo 05-06-2016 and completed radiation of left tonsil/ bilateral neck 05-15-2016  w/ radiation injury to tongue (denervation chronic)   Wears glasses    Wears hearing aid in both ears     Past Surgical History:  Procedure  Laterality Date   INGUINAL HERNIA REPAIR Left 05/02/2022   Procedure: OPEN LEFT INGUINAL HERNIA REPAIR WITH MESH;  Surgeon: Berna Bue, MD;  Location: [facility] [place];  Service: General;  Laterality: Left;   IR GASTROSTOMY TUBE MOD SED  11/07/2022   IR GASTROSTOMY TUBE REMOVAL  01/30/2017   IR GENERIC HISTORICAL  03/20/2016   IR US GUIDE VASC ACCESS RIGHT 03/20/2016 Simonne Come, MD [facility]   IR GENERIC HISTORICAL  03/20/2016   IR FLUORO GUIDE PORT INSERTION RIGHT 03/20/2016 Simonne Come, MD [facility]   IR GENERIC HISTORICAL  03/20/2016   IR GASTROSTOMY TUBE MOD SED 03/20/2016 Simonne Come, MD [facility]   IR REMOVAL TUN ACCESS W/ PORT W/O FL MOD SED  01/30/2017    MEDICATIONS:  acetaminophen (TYLENOL) 500 MG tablet   cetirizine (ZYRTEC) 10 MG tablet   Coenzyme Q10 (COQ-10) 200 MG CAPS   famotidine (PEPCID) 20 MG tablet   fenofibrate (TRICOR) 145 MG tablet  fluticasone (FLONASE) 50 MCG/ACT nasal spray   glucosamine-chondroitin 500-400 MG tablet   levothyroxine (SYNTHROID) 125 MCG tablet   meloxicam (MOBIC) 7.5 MG tablet   No current facility-administered medications for this encounter.    Shonna Chock, PA-C Surgical Short Stay/Anesthesiology [facility] Phone [phone] [facility] Phone [phone] 04/10/2023 1:38 PM

## 2023-04-10 NOTE — Anesthesia Preprocedure Evaluation (Signed)
Anesthesia Evaluation  Patient identified by MRN, date of birth, ID band Patient awake    Reviewed: Allergy & Precautions, NPO status , Patient's Chart, lab work & pertinent test results, reviewed documented beta blocker date and time   History of Anesthesia Complications Negative for: history of anesthetic complications  Airway Mallampati: III  TM Distance: >3 FB Neck ROM: Limited  Mouth opening: Limited Mouth Opening  Dental no notable dental hx.    Pulmonary pneumonia, resolved, neg recent URI   breath sounds clear to auscultation       Cardiovascular hypertension, (-) angina (-) CAD and (-) Past MI  Rhythm:Regular Rate:Normal     Neuro/Psych  Neuromuscular disease    GI/Hepatic ,GERD  ,,(+) neg Cirrhosis        Endo/Other  Hypothyroidism    Renal/GU Renal disease     Musculoskeletal  (+) Arthritis ,    Abdominal   Peds  Hematology  (+) Blood dyscrasia, anemia   Anesthesia Other Findings   Reproductive/Obstetrics                             Anesthesia Physical Anesthesia Plan  ASA: 3  Anesthesia Plan: General   Post-op Pain Management:    Induction: Intravenous  PONV Risk Score and Plan: 2 and Ondansetron and Dexamethasone  Airway Management Planned: Oral ETT and Video Laryngoscope Planned  Additional Equipment:   Intra-op Plan:   Post-operative Plan: Extubation in OR  Informed Consent: I have reviewed the patients History and Physical, chart, labs and discussed the procedure including the risks, benefits and alternatives for the proposed anesthesia with the patient or authorized representative who has indicated his/her understanding and acceptance.     Dental advisory given  Plan Discussed with:   Anesthesia Plan Comments: (See PAT note written 04/10/2023 by Shonna Chock, PA-C. S/p radiation for left tonsillar cancer 2017. Prescribed Florinef for orthostatic  hypotension in setting of volume depletion/decreased oral intake. S/p G-tube, replaced June 2024.   Per 03/23/23 visit with [facility] neurology for "follow up of tongue weakness, cervical DDD. Unfortunately he has more hypernasal dysarthria and more tongue atrophy today, his weakness worsened to the point where he needed a PEG tube over the summer. He is doing better with this, and his arm weakness and numbness improved after her cervical spine injections. 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 This is still possibly related to the radiation field, but at this point his voice sounds more like a patient with ALS. Will discuss with our ALS clinic and consider having him go for a second opinion. In the meantime, he can continue following with ENT and undergo the procedure they are planning in December. Will plan for follow-up here in 4 months, sooner if needed." )        Anesthesia Quick Evaluation

## 2023-04-13 MED ORDER — SODIUM CHLORIDE 0.9 % IV SOLN
3.0000 g | INTRAVENOUS | Status: AC
  Administered 2023-04-14: 3 g via INTRAVENOUS
  Filled 2023-04-13 (×3): qty 8

## 2023-04-14 ENCOUNTER — Ambulatory Visit (HOSPITAL_COMMUNITY): Payer: BC Managed Care – PPO | Admitting: Vascular Surgery

## 2023-04-14 ENCOUNTER — Other Ambulatory Visit: Payer: Self-pay

## 2023-04-14 ENCOUNTER — Encounter (HOSPITAL_COMMUNITY)
Admission: RE | Disposition: A | Discharge status: Home / Self Care | Payer: Self-pay | Source: Ambulatory Visit | Attending: Otolaryngology

## 2023-04-14 ENCOUNTER — Encounter (HOSPITAL_COMMUNITY): Payer: Self-pay

## 2023-04-14 ENCOUNTER — Ambulatory Visit (HOSPITAL_COMMUNITY): Payer: BC Managed Care – PPO | Admitting: Certified Registered Nurse Anesthetist

## 2023-04-14 ENCOUNTER — Ambulatory Visit (HOSPITAL_BASED_OUTPATIENT_CLINIC_OR_DEPARTMENT_OTHER)
Admission: RE | Admit: 2023-04-14 | Discharge: 2023-04-14 | Disposition: A | Discharge status: Home / Self Care | Payer: BC Managed Care – PPO | Source: Ambulatory Visit | Attending: Otolaryngology | Admitting: Otolaryngology

## 2023-04-14 DIAGNOSIS — K1379 Other lesions of oral mucosa: Secondary | ICD-10-CM | POA: Insufficient documentation

## 2023-04-14 DIAGNOSIS — J3801 Paralysis of vocal cords and larynx, unilateral: Secondary | ICD-10-CM | POA: Insufficient documentation

## 2023-04-14 DIAGNOSIS — H9071 Mixed conductive and sensorineural hearing loss, unilateral, right ear, with unrestricted hearing on the contralateral side: Secondary | ICD-10-CM | POA: Insufficient documentation

## 2023-04-14 DIAGNOSIS — R1314 Dysphagia, pharyngoesophageal phase: Secondary | ICD-10-CM | POA: Diagnosis not present

## 2023-04-14 DIAGNOSIS — Z923 Personal history of irradiation: Secondary | ICD-10-CM | POA: Insufficient documentation

## 2023-04-14 DIAGNOSIS — Z9221 Personal history of antineoplastic chemotherapy: Secondary | ICD-10-CM | POA: Insufficient documentation

## 2023-04-14 DIAGNOSIS — J383 Other diseases of vocal cords: Secondary | ICD-10-CM | POA: Diagnosis not present

## 2023-04-14 DIAGNOSIS — R1310 Dysphagia, unspecified: Secondary | ICD-10-CM | POA: Insufficient documentation

## 2023-04-14 DIAGNOSIS — I129 Hypertensive chronic kidney disease with stage 1 through stage 4 chronic kidney disease, or unspecified chronic kidney disease: Secondary | ICD-10-CM | POA: Diagnosis not present

## 2023-04-14 DIAGNOSIS — R490 Dysphonia: Secondary | ICD-10-CM

## 2023-04-14 DIAGNOSIS — H6991 Unspecified Eustachian tube disorder, right ear: Secondary | ICD-10-CM | POA: Insufficient documentation

## 2023-04-14 DIAGNOSIS — H9201 Otalgia, right ear: Secondary | ICD-10-CM | POA: Diagnosis not present

## 2023-04-14 DIAGNOSIS — T17908A Unspecified foreign body in respiratory tract, part unspecified causing other injury, initial encounter: Secondary | ICD-10-CM

## 2023-04-14 DIAGNOSIS — Z931 Gastrostomy status: Secondary | ICD-10-CM | POA: Insufficient documentation

## 2023-04-14 DIAGNOSIS — R1312 Dysphagia, oropharyngeal phase: Secondary | ICD-10-CM | POA: Diagnosis not present

## 2023-04-14 DIAGNOSIS — Z85818 Personal history of malignant neoplasm of other sites of lip, oral cavity, and pharynx: Secondary | ICD-10-CM | POA: Insufficient documentation

## 2023-04-14 DIAGNOSIS — N183 Chronic kidney disease, stage 3 unspecified: Secondary | ICD-10-CM | POA: Diagnosis not present

## 2023-04-14 HISTORY — PX: MYRINGOTOMY WITH TUBE PLACEMENT: SHX5663

## 2023-04-14 HISTORY — PX: RIGID BRONCHOSCOPY: SHX5069

## 2023-04-14 HISTORY — PX: MICROLARYNGOSCOPY W/VOCAL CORD INJECTION: SHX2665

## 2023-04-14 HISTORY — PX: LARYNGOSCOPY WITH BRONCHOSCOPY AND BALLOON DILATION: SHX5939

## 2023-04-14 SURGERY — LARYNGOSCOPY, WITH BRONCHOSCOPY AND DILATION PROCEDURE
Anesthesia: General | Laterality: Right

## 2023-04-14 MED ORDER — LIDOCAINE 2% (20 MG/ML) 5 ML SYRINGE
INTRAMUSCULAR | Status: AC
  Filled 2023-04-14: qty 5

## 2023-04-14 MED ORDER — PHENYLEPHRINE 80 MCG/ML (10ML) SYRINGE FOR IV PUSH (FOR BLOOD PRESSURE SUPPORT)
PREFILLED_SYRINGE | INTRAVENOUS | Status: DC | PRN
  Administered 2023-04-14: 160 ug via INTRAVENOUS
  Administered 2023-04-14 (×2): 80 ug via INTRAVENOUS

## 2023-04-14 MED ORDER — OXYCODONE HCL 5 MG/5ML PO SOLN: 5.0000 mg | Freq: Four times a day (QID) | ORAL | 0 refills | Status: AC | PRN

## 2023-04-14 MED ORDER — PROPOFOL 10 MG/ML IV BOLUS
INTRAVENOUS | Status: DC | PRN
  Administered 2023-04-14: 120 mg via INTRAVENOUS

## 2023-04-14 MED ORDER — FENTANYL CITRATE (PF) 100 MCG/2ML IJ SOLN
INTRAMUSCULAR | Status: AC
  Filled 2023-04-14: qty 2

## 2023-04-14 MED ORDER — MIDAZOLAM HCL 2 MG/2ML IJ SOLN
INTRAMUSCULAR | Status: DC | PRN
  Administered 2023-04-14: 2 mg via INTRAVENOUS

## 2023-04-14 MED ORDER — CIPROFLOXACIN-DEXAMETHASONE 0.3-0.1 % OT SUSP
OTIC | Status: DC | PRN
  Administered 2023-04-14: 4 [drp] via OTIC

## 2023-04-14 MED ORDER — ROCURONIUM BROMIDE 10 MG/ML (PF) SYRINGE
PREFILLED_SYRINGE | INTRAVENOUS | Status: AC
  Filled 2023-04-14: qty 10

## 2023-04-14 MED ORDER — LIDOCAINE-EPINEPHRINE 1 %-1:100000 IJ SOLN
INTRAMUSCULAR | Status: AC
  Filled 2023-04-14: qty 1

## 2023-04-14 MED ORDER — ONDANSETRON HCL 4 MG/2ML IJ SOLN: 4.0000 mg | Freq: Once | INTRAMUSCULAR | Status: DC | PRN

## 2023-04-14 MED ORDER — CHLORHEXIDINE GLUCONATE 0.12 % MT SOLN: 15.0000 mL | Freq: Once | OROMUCOSAL | Status: AC

## 2023-04-14 MED ORDER — CIPROFLOXACIN-DEXAMETHASONE 0.3-0.1 % OT SUSP
4.0000 [drp] | OTIC | Status: DC
  Filled 2023-04-14: qty 7.5

## 2023-04-14 MED ORDER — AMOXICILLIN-POT CLAVULANATE 600-42.9 MG/5ML PO SUSR: 600.0000 mg | Freq: Two times a day (BID) | ORAL | 0 refills | Status: AC

## 2023-04-14 MED ORDER — EPINEPHRINE HCL (NASAL) 0.1 % NA SOLN
NASAL | Status: AC
  Filled 2023-04-14: qty 60

## 2023-04-14 MED ORDER — CHLORHEXIDINE GLUCONATE 0.12 % MT SOLN
OROMUCOSAL | Status: AC
  Administered 2023-04-14: 15 mL via OROMUCOSAL
  Filled 2023-04-14: qty 15

## 2023-04-14 MED ORDER — DEXAMETHASONE SODIUM PHOSPHATE 10 MG/ML IJ SOLN
INTRAMUSCULAR | Status: DC | PRN
  Administered 2023-04-14: 10 mg via INTRAVENOUS

## 2023-04-14 MED ORDER — FENTANYL CITRATE (PF) 250 MCG/5ML IJ SOLN
INTRAMUSCULAR | Status: DC | PRN
  Administered 2023-04-14 (×2): 50 ug via INTRAVENOUS

## 2023-04-14 MED ORDER — ROCURONIUM BROMIDE 10 MG/ML (PF) SYRINGE
PREFILLED_SYRINGE | INTRAVENOUS | Status: DC | PRN
  Administered 2023-04-14: 80 mg via INTRAVENOUS

## 2023-04-14 MED ORDER — ONDANSETRON HCL 4 MG/2ML IJ SOLN
INTRAMUSCULAR | Status: AC
  Filled 2023-04-14: qty 2

## 2023-04-14 MED ORDER — FENTANYL CITRATE (PF) 100 MCG/2ML IJ SOLN: 25.0000 ug | INTRAMUSCULAR | Status: DC | PRN

## 2023-04-14 MED ORDER — ONDANSETRON HCL 4 MG/2ML IJ SOLN
INTRAMUSCULAR | Status: DC | PRN
  Administered 2023-04-14: 4 mg via INTRAVENOUS

## 2023-04-14 MED ORDER — OXYMETAZOLINE HCL 0.05 % NA SOLN
NASAL | Status: DC | PRN
  Administered 2023-04-14: 1 via TOPICAL

## 2023-04-14 MED ORDER — TRIAMCINOLONE ACETONIDE 40 MG/ML IJ SUSP
INTRAMUSCULAR | Status: AC
  Filled 2023-04-14: qty 5

## 2023-04-14 MED ORDER — CIPROFLOXACIN-DEXAMETHASONE 0.3-0.1 % OT SUSP
OTIC | Status: AC
  Filled 2023-04-14: qty 7.5

## 2023-04-14 MED ORDER — OXYMETAZOLINE HCL 0.05 % NA SOLN
NASAL | Status: AC
  Filled 2023-04-14: qty 30

## 2023-04-14 MED ORDER — OXYCODONE HCL 5 MG PO TABS: 5.0000 mg | ORAL_TABLET | Freq: Once | ORAL | Status: DC | PRN

## 2023-04-14 MED ORDER — MIDAZOLAM HCL 2 MG/2ML IJ SOLN
INTRAMUSCULAR | Status: AC
  Filled 2023-04-14: qty 2

## 2023-04-14 MED ORDER — LIDOCAINE 2% (20 MG/ML) 5 ML SYRINGE
INTRAMUSCULAR | Status: DC | PRN
  Administered 2023-04-14: 100 mg via INTRAVENOUS

## 2023-04-14 MED ORDER — ORAL CARE MOUTH RINSE: 15.0000 mL | Freq: Once | OROMUCOSAL | Status: AC

## 2023-04-14 MED ORDER — METHYLPREDNISOLONE 4 MG PO TBPK: ORAL_TABLET | ORAL | 1 refills | Status: DC

## 2023-04-14 MED ORDER — LACTATED RINGERS IV SOLN: INTRAVENOUS | Status: DC

## 2023-04-14 MED ORDER — SUGAMMADEX SODIUM 200 MG/2ML IV SOLN
INTRAVENOUS | Status: DC | PRN
  Administered 2023-04-14: 200 mg via INTRAVENOUS

## 2023-04-14 MED ORDER — OXYCODONE HCL 5 MG/5ML PO SOLN: 5.0000 mg | Freq: Once | ORAL | Status: DC | PRN

## 2023-04-14 MED ORDER — 0.9 % SODIUM CHLORIDE (POUR BTL) OPTIME
TOPICAL | Status: DC | PRN
  Administered 2023-04-14: 1000 mL

## 2023-04-14 MED ORDER — ACETAMINOPHEN 10 MG/ML IV SOLN: 1000.0000 mg | Freq: Once | INTRAVENOUS | Status: DC | PRN

## 2023-04-14 SURGICAL SUPPLY — 50 items
BAG COUNTER SPONGE SURGICOUNT (BAG) ×3 IMPLANT
BALLN PULM 15 16.5 18X75 (BALLOONS)
BALLOON PULM 15 16.5 18X75 (BALLOONS) IMPLANT
BLADE MYRINGOTOMY 6 SPEAR HDL (BLADE) ×3 IMPLANT
BLADE SURG 15 STRL LF DISP TIS (BLADE) IMPLANT
CANISTER SUCT 3000ML PPV (MISCELLANEOUS) ×3 IMPLANT
CATH BALLOON 10X40MM (CATHETERS) IMPLANT
CNTNR URN SCR LID CUP LEK RST (MISCELLANEOUS) ×3 IMPLANT
COTTONBALL LRG STERILE PKG (GAUZE/BANDAGES/DRESSINGS) ×3 IMPLANT
COVER BACK TABLE 60X90IN (DRAPES) ×3 IMPLANT
COVER MAYO STAND STRL (DRAPES) ×3 IMPLANT
DEVICE INFLATION 20/61 (MISCELLANEOUS) IMPLANT
DRAPE HALF SHEET 40X57 (DRAPES) ×3 IMPLANT
DRSG TELFA 3X8 NADH STRL (GAUZE/BANDAGES/DRESSINGS) ×3 IMPLANT
GAUZE 4X4 16PLY ~~LOC~~+RFID DBL (SPONGE) ×3 IMPLANT
GAUZE SPONGE 4X4 12PLY STRL (GAUZE/BANDAGES/DRESSINGS) ×3 IMPLANT
GLOVE BIO SURGEON STRL SZ 6 (GLOVE) ×3 IMPLANT
GOWN STRL REUS W/ TWL LRG LVL3 (GOWN DISPOSABLE) IMPLANT
GOWN STRL REUS W/TWL LRG LVL3 (GOWN DISPOSABLE) ×3 IMPLANT
GUARD TEETH (MISCELLANEOUS) ×3 IMPLANT
KIT BASIN OR (CUSTOM PROCEDURE TRAY) ×3 IMPLANT
KIT PROLARN PLUS GEL W/NDL (Prosthesis and Implant ENT) IMPLANT
KIT TURNOVER KIT B (KITS) ×3 IMPLANT
MARKER SKIN DUAL TIP RULER LAB (MISCELLANEOUS) ×3 IMPLANT
NDL HYPO 25GX1X1/2 BEV (NEEDLE) IMPLANT
NDL SPNL 22GX3.5 QUINCKE BK (NEEDLE) IMPLANT
NDL TRANS ORAL INJECTION (NEEDLE) IMPLANT
NEEDLE HYPO 25GX1X1/2 BEV (NEEDLE)
NEEDLE SPNL 22GX3.5 QUINCKE BK (NEEDLE)
NEEDLE TRANS ORAL INJECTION (NEEDLE)
NS IRRIG 1000ML POUR BTL (IV SOLUTION) ×3 IMPLANT
PAD ARMBOARD 7.5X6 YLW CONV (MISCELLANEOUS) ×6 IMPLANT
PATTIES SURGICAL .5 X.5 (GAUZE/BANDAGES/DRESSINGS) ×3 IMPLANT
PATTIES SURGICAL .5 X1 (DISPOSABLE) ×3 IMPLANT
POSITIONER HEAD DONUT 9IN (MISCELLANEOUS) ×3 IMPLANT
SET COLLECT BLD 25X3/4 12 (NEEDLE) IMPLANT
SOL ANTI FOG 6CC (MISCELLANEOUS) ×3 IMPLANT
SURGILUBE 2OZ TUBE FLIPTOP (MISCELLANEOUS) ×3 IMPLANT
SYR 3ML LL SCALE MARK (SYRINGE) IMPLANT
SYR BULB EAR ULCER 3OZ GRN STR (SYRINGE) IMPLANT
SYR GAUGE ASSEMBLY ALLIANCE II (MISCELLANEOUS) IMPLANT
SYR TB 1ML LUER SLIP (SYRINGE) IMPLANT
SYSTEM BALLN DILATION 16X40 (BALLOONS) IMPLANT
TOWEL GREEN STERILE FF (TOWEL DISPOSABLE) ×6 IMPLANT
TUBE CONNECTING 12X1/4 (SUCTIONS) ×3 IMPLANT
TUBE EAR PAPARELLA TYPE 1 (OTOLOGIC RELATED) IMPLANT
TUBE EAR T MOD 1.32X4.8 BL (OTOLOGIC RELATED) IMPLANT
TUBING EXTENTION W/L.L. (IV SETS) ×3 IMPLANT
WATER STERILE IRR 1000ML POUR (IV SOLUTION) ×3 IMPLANT
WIRE CRE 18-20MM 8CM F G (MISCELLANEOUS) IMPLANT

## 2023-04-14 NOTE — Transfer of Care (Signed)
Immediate Anesthesia Transfer of Care Note  Patient: Todd [name]  Procedure(s) Performed: LARYNGOSCOPY WITH BRONCHOSCOPY AND BALLOON DILATION MICROLARYNGOSCOPY WITH VOCAL CORD INJECTION (Bilateral) RIGID BRONCHOSCOPY MYRINGOTOMY WITH TUBE PLACEMENT (Right)  Patient Location: PACU  Anesthesia Type:General  Level of Consciousness: awake, alert , and oriented  Airway & Oxygen Therapy: Patient Spontanous Breathing  Post-op Assessment: Report given to RN and Post -op Vital signs reviewed and stable  Post vital signs: Reviewed and stable  Last Vitals:  Vitals Value Taken Time  BP 181/109 04/14/23 1345  Temp    Pulse 63 04/14/23 1347  Resp 12 04/14/23 1347  SpO2 98 % 04/14/23 1347  Vitals shown include unfiled device data.  Last Pain:  Vitals:   04/14/23 1049  TempSrc:   PainSc: 0-No pain         Complications: No notable events documented.

## 2023-04-14 NOTE — Op Note (Signed)
OPERATIVE REPORT  PREOPERATIVE DIAGNOSIS: 1. Left vocal fold paralysis  2. Dysphonia and glottic insufficiency 3. History of oropharyngeal cancer treated with radiation  4. Dysphagia and aspiration, PEG dependent  5. Right sided eustachian tube dysfunction and chronic middle ear effusion, evidence of mixed hearing loss on the right side on recent Audiogram  POSTOPERATIVE DIAGNOSIS: 1. Left vocal fold paralysis  2. Dysphonia and glottic insufficiency 3. History of oropharyngeal cancer treated with radiation  4. Dysphagia and aspiration, PEG dependent  5. Right sided eustachian tube dysfunction and chronic middle ear effusion, evidence of mixed hearing loss on the right side on recent Audiogram  PROCEDURE: 1. Direct microlaryngoscopy rigid bronchoscopy and bilateral vocal fold injection augmentation  2. EGD and balloon dilation of UES/CP segment  3. Myringotomy and ventilating T-tube placement   SURGEON: Ashok Croon, MD  ANESTHESIA: General endotracheal   COMPLICATIONS: None.  ESTIMATED BLOOD LOSS: < 5 mL  INTRAOPERATIVE FINDINGS: 1. Bilateral vocal fold atrophy  2. EGD with irregular Z-line, no obvious strictures noted in esophagus, narrowed pharyngoesophageal segment 3. No evidence of purulence or fluid during myringotomy and T-tube placement  SPECIMEN: None   INDICATIONS AND CONSENT: 18 yoM with hx of stage IV oropharyngeal cancer, s/p primary chemo-radiation a few years ago, who developed dysphagia and aspiration, became PEG dependent. Examination revealed pharyngoesophageal dysphagia and aspiration on swallow study and evidence of left vocal fold paralysis and glottic insufficiency. Of note he also had evidence of chronic middle ear effusion on the right and evidence of mixed hearing loss on the right. The patient was offered the aforementioned procedures. The procedure, risks, benefits, alternatives, potential complications, possible outcomes as well as the option of no  treatment were reviewed with the patient who indicated their understanding and wished to proceed forward with the procedure. Informed consent was obtained.  DESCRIPTION OF PROCEDURE: On 04/14/23, the patient was brought to the operating room by the anesthesia team. The patient was transferred onto the operating table and placed in supine position. After smooth induction of general endotracheal anesthesia, a surgeon initiated time-out was performed. We first began by bringing the operating microscope to the right side of the bed and evaluating right ear canal. There was no evidence of obvious air fluid level. Next, using a myringotomy knife a small radial incision was created and a 1.14 T-tube was inserted through the opening without issues. There was evidence of good hemostasis at the end of this step, the barrel of the tube appeared in good position.  Next, the head of the bed had been rotated 90 degrees. The patient was then draped in the appropriate fashion.  We first began by inserting a maxillary tooth guard over the maxillary dentition for protection during the procedure. The male Sataloff laryngoscope was then advanced into the oral cavity and oropharynx until the larynx was brought into view. Once the larynx was adequately visualized the patient was placed into suspension. A 0-degree Hopkins endoscope was then used to visualize the larynx and photo documentation was obtained. Thorough examination revealed bilateral vocal fold atrophy. Rigid bronchoscopy revealed small amount of dry purulent secretions along the posterior pharyngeal wall, and moderate edema in post-cricoid area. There was no evidence of subglottic or infraglottic scar, and no evidence of lesions in the proximal trachea. We then proceeded with EGD using a flexible esophagoscope and advancing the scope into the post-cricoid area. The esophagoscope was passed through the post-cricoid space and a complete esophagoscopy was completed. There  was evidence of narrowing of  the pharyngoesophageal segment, and mild irregularity to the Z-line/gastroesophageal junction. No strictures were noted otherwise, and the stomach appeared normal. The 17-21 mm balloon was then passed alongside the esophagoscope and advanced into the post-cricoid area to the area of the stenosis and inflated slowly to 20 mm. This dilation was held for 1 minute after which the area was noted to be intact without trauma. The telescope and balloon were then slowly withdrawn.   Lastly we repositioned the laryngoscope after taking the patient out of suspension to bring bilateral vocal folds into view. Using rigid bronchoscope for visualization and anterior neck pressure approximately 0.4 ml and 0.5 ml of Prolaryn Plus was injected on the right and the left side respectively. Residual filler was suctioned out and Afrin soaked pledgets were used to clear off  blood tinged secretions.  Photo documentation was once again obtained. The laryngoscope was then taken out of suspension and removed under direct visualization without injury to the lips or gingiva. The maxillary tooth guard was removed and the dentition was noted to be without injury.  This completed the procedure. The patient tolerated the procedure well without any immediate post operative complications. All counts were correct x2 at the conclusion of the procedure.

## 2023-04-14 NOTE — Discharge Instructions (Signed)
Post-Op Instructions following vocal fold injection and EGD dilation procedure  Esophageal Dilation This post-operative instruction sheet is designed to help you understand what to expect after surgery and help answer any of the common questions you may have. It is not entirely comprehensive, so if you have any questions, do not hesitate to call the office. If you have any urgent concerns, please contact Dr Leighton Roach office. If you are having a medical emergency, you should not hesitate to call 911.  What to Expect: - It is common to have a sore throat for several days after surgery. You may have some bloody drainage from your nose. - You can eat and drink anything you normally do - there are no restrictions due to surgery alone.  If you have been recommended any other type of diet, such as an acid reflux diet, you should continue that.  You may want to eat light meals the day of anesthesia to make sure you don't get nauseated. If you are not eating by mouth currently, please follow the instructions of your speech and language pathologist when it comes to oral diet - The most important things to look out for after having your esophagus stretched is a fever or new chest or upper back pain.  If you have either of these, call the doctor immediately. They could be a sign that you have an esophagus tear. If this happens, do not eat anything until you have talked to one of the ENT doctors. For pain, you may try Tylenol (acetaminophen) first or try ibuprofen. See the section below for more precautions on taking medication. - It is very important that you stay well-hydrated and drink plenty of fluids during the recovery period.  Getting dehydrated tends to worsen post-operative pain. - Avoid tobacco products, spicy foods, excessive alcohol, or eating late at night as these may cause heartburn or reflux of stomach acid into the throat and may delay the healing process.  If you are on reflux medication already  (such as Prilosec (omeprazole) or Nexium (esomeprazole)), continue it after surgery.  If you are not on anything regularly and have reflux symptoms in the post-operative period (heartburn, throat burning, excessive throat mucous or throat-clearing, sensation of a lump in your throat) then you can treat these symptoms with over-the-counter Pepcid or Zantac. - You can return to normal activity 24-48 hours after surgery; avoid intense exercise for the first week. - Call the office if you experience any of the following: Fever higher than 100.5 F Chest or upper back pain Difficulty breathing or swallowing Bleeding from the mouth  Safety Information for Giving and Taking Medications: - Each time you give a medication, read the label. - If your medication is in liquid form, do not measure liquid with a kitchen spoon. There are pediatric measuring devices available at the pharmacy. Ask for one when you get your prescription filled. - If you have questions, ask the pharmacist. - Do not take Tylenol for pain if you have a history of liver problems. Check with your primary care doctor if you are uncertain. - Do not take ibuprofen for pain if you have a history of stomach bleeding or ulcers and have been told to avoid Non-Steroidal Anti-Inflammatory Drugs (NSAIDs).  Certain blood thinners can also interact with ibuprofen.  Check with your primary care doctor if you are uncertain.  Follow-up Appointment: We will see you in a couple of weeks and will discuss repeat esophageal dilation. You will need a repeat swallow study  to determine if you can trial more PO in the future.

## 2023-04-14 NOTE — Interval H&P Note (Signed)
History and Physical Interval Note:  04/14/2023 10:27 AM  Todd [name]  has presented today for surgery, with the diagnosis of Decreased hearing of right ear,Oropharyngeal dysphagia, Vocal fold paralysis, left.  The various methods of treatment have been discussed with the patient and family. After consideration of risks, benefits and other options for treatment, the patient has consented to direct microlaryngoscopy, bronchoscopy, bilateral vocal fold injection augmentation, transoral esophagoscopy and balloon dilation, possible myringotomy and tympanostomy tube insertion on the right side as a surgical intervention.  The patient's history has been reviewed, patient examined, no change in status, stable for surgery.  I have reviewed the patient's chart and labs.  Questions were answered to the patient's satisfaction.     Ashok Croon

## 2023-04-14 NOTE — Anesthesia Procedure Notes (Signed)
Procedure Name: Intubation Date/Time: 04/14/2023 1:25 PM  Performed by: Hali Marry, CRNAPre-anesthesia Checklist: Patient identified, Emergency Drugs available, Suction available and Patient being monitored Patient Re-evaluated:Patient Re-evaluated prior to induction Oxygen Delivery Method: Circle system utilized Preoxygenation: Pre-oxygenation with 100% oxygen Induction Type: IV induction Ventilation: Mask ventilation without difficulty Laryngoscope Size: Glidescope and 4 Grade View: Grade I Tube type: Oral Tube size: 6.5 mm Number of attempts: 1 Airway Equipment and Method: Stylet and Oral airway Placement Confirmation: ETT inserted through vocal cords under direct vision, positive ETCO2 and breath sounds checked- equal and bilateral Secured at: 23 cm Tube secured with: Tape Dental Injury: Teeth and Oropharynx as per pre-operative assessment

## 2023-04-15 ENCOUNTER — Encounter (HOSPITAL_COMMUNITY): Payer: Self-pay | Admitting: Otolaryngology

## 2023-04-15 ENCOUNTER — Other Ambulatory Visit (INDEPENDENT_AMBULATORY_CARE_PROVIDER_SITE_OTHER): Payer: Self-pay | Admitting: Otolaryngology

## 2023-04-15 ENCOUNTER — Other Ambulatory Visit: Payer: Self-pay

## 2023-04-15 ENCOUNTER — Emergency Department (HOSPITAL_COMMUNITY): Payer: BC Managed Care – PPO

## 2023-04-15 ENCOUNTER — Inpatient Hospital Stay (HOSPITAL_COMMUNITY)
Admission: EM | Admit: 2023-04-15 | Discharge: 2023-04-16 | DRG: 145 | Disposition: A | Discharge status: Home / Self Care | Payer: BC Managed Care – PPO | Attending: Pulmonary Disease | Admitting: Pulmonary Disease

## 2023-04-15 DIAGNOSIS — K222 Esophageal obstruction: Secondary | ICD-10-CM | POA: Diagnosis present

## 2023-04-15 DIAGNOSIS — T50995A Adverse effect of other drugs, medicaments and biological substances, initial encounter: Secondary | ICD-10-CM | POA: Diagnosis present

## 2023-04-15 DIAGNOSIS — Z85818 Personal history of malignant neoplasm of other sites of lip, oral cavity, and pharynx: Secondary | ICD-10-CM | POA: Diagnosis not present

## 2023-04-15 DIAGNOSIS — E039 Hypothyroidism, unspecified: Secondary | ICD-10-CM | POA: Diagnosis present

## 2023-04-15 DIAGNOSIS — J384 Edema of larynx: Secondary | ICD-10-CM | POA: Diagnosis not present

## 2023-04-15 DIAGNOSIS — Z825 Family history of asthma and other chronic lower respiratory diseases: Secondary | ICD-10-CM

## 2023-04-15 DIAGNOSIS — E782 Mixed hyperlipidemia: Secondary | ICD-10-CM | POA: Diagnosis not present

## 2023-04-15 DIAGNOSIS — R634 Abnormal weight loss: Secondary | ICD-10-CM | POA: Diagnosis not present

## 2023-04-15 DIAGNOSIS — R0603 Acute respiratory distress: Secondary | ICD-10-CM

## 2023-04-15 DIAGNOSIS — Y842 Radiological procedure and radiotherapy as the cause of abnormal reaction of the patient, or of later complication, without mention of misadventure at the time of the procedure: Secondary | ICD-10-CM | POA: Diagnosis not present

## 2023-04-15 DIAGNOSIS — R1310 Dysphagia, unspecified: Secondary | ICD-10-CM

## 2023-04-15 DIAGNOSIS — R1313 Dysphagia, pharyngeal phase: Secondary | ICD-10-CM | POA: Diagnosis not present

## 2023-04-15 DIAGNOSIS — H903 Sensorineural hearing loss, bilateral: Secondary | ICD-10-CM | POA: Diagnosis present

## 2023-04-15 DIAGNOSIS — J3801 Paralysis of vocal cords and larynx, unilateral: Secondary | ICD-10-CM | POA: Diagnosis not present

## 2023-04-15 DIAGNOSIS — K219 Gastro-esophageal reflux disease without esophagitis: Secondary | ICD-10-CM | POA: Diagnosis not present

## 2023-04-15 DIAGNOSIS — Z9221 Personal history of antineoplastic chemotherapy: Secondary | ICD-10-CM | POA: Diagnosis not present

## 2023-04-15 DIAGNOSIS — I10 Essential (primary) hypertension: Secondary | ICD-10-CM | POA: Diagnosis present

## 2023-04-15 DIAGNOSIS — R1314 Dysphagia, pharyngoesophageal phase: Secondary | ICD-10-CM | POA: Diagnosis not present

## 2023-04-15 DIAGNOSIS — Z931 Gastrostomy status: Secondary | ICD-10-CM

## 2023-04-15 DIAGNOSIS — R1312 Dysphagia, oropharyngeal phase: Secondary | ICD-10-CM | POA: Diagnosis not present

## 2023-04-15 DIAGNOSIS — Z79890 Hormone replacement therapy (postmenopausal): Secondary | ICD-10-CM | POA: Diagnosis not present

## 2023-04-15 DIAGNOSIS — K1379 Other lesions of oral mucosa: Secondary | ICD-10-CM | POA: Diagnosis present

## 2023-04-15 DIAGNOSIS — J383 Other diseases of vocal cords: Secondary | ICD-10-CM | POA: Diagnosis present

## 2023-04-15 DIAGNOSIS — H9071 Mixed conductive and sensorineural hearing loss, unilateral, right ear, with unrestricted hearing on the contralateral side: Secondary | ICD-10-CM | POA: Diagnosis present

## 2023-04-15 DIAGNOSIS — Z923 Personal history of irradiation: Secondary | ICD-10-CM

## 2023-04-15 DIAGNOSIS — Z818 Family history of other mental and behavioral disorders: Secondary | ICD-10-CM

## 2023-04-15 DIAGNOSIS — H6981 Other specified disorders of Eustachian tube, right ear: Secondary | ICD-10-CM | POA: Diagnosis present

## 2023-04-15 DIAGNOSIS — Z8701 Personal history of pneumonia (recurrent): Secondary | ICD-10-CM | POA: Diagnosis not present

## 2023-04-15 DIAGNOSIS — R061 Stridor: Secondary | ICD-10-CM | POA: Diagnosis not present

## 2023-04-15 DIAGNOSIS — J989 Respiratory disorder, unspecified: Secondary | ICD-10-CM

## 2023-04-15 DIAGNOSIS — R0689 Other abnormalities of breathing: Secondary | ICD-10-CM | POA: Diagnosis not present

## 2023-04-15 DIAGNOSIS — R0602 Shortness of breath: Secondary | ICD-10-CM | POA: Diagnosis not present

## 2023-04-15 DIAGNOSIS — Z79899 Other long term (current) drug therapy: Secondary | ICD-10-CM

## 2023-04-15 DIAGNOSIS — Z882 Allergy status to sulfonamides status: Secondary | ICD-10-CM

## 2023-04-15 DIAGNOSIS — Z888 Allergy status to other drugs, medicaments and biological substances status: Secondary | ICD-10-CM | POA: Diagnosis not present

## 2023-04-15 DIAGNOSIS — Z833 Family history of diabetes mellitus: Secondary | ICD-10-CM

## 2023-04-15 DIAGNOSIS — I951 Orthostatic hypotension: Secondary | ICD-10-CM | POA: Diagnosis present

## 2023-04-15 DIAGNOSIS — Z974 Presence of external hearing-aid: Secondary | ICD-10-CM

## 2023-04-15 DIAGNOSIS — R490 Dysphonia: Secondary | ICD-10-CM | POA: Diagnosis present

## 2023-04-15 LAB — CBC WITH DIFFERENTIAL/PLATELET
Abs Immature Granulocytes: 0.01 10*3/uL (ref 0.00–0.07)
Basophils Absolute: 0 10*3/uL (ref 0.0–0.1)
Basophils Relative: 1 %
Eosinophils Absolute: 0.1 10*3/uL (ref 0.0–0.5)
Eosinophils Relative: 2 %
HCT: 40.8 % (ref 39.0–52.0)
Hemoglobin: 13.3 g/dL (ref 13.0–17.0)
Immature Granulocytes: 0 %
Lymphocytes Relative: 13 %
Lymphs Abs: 0.7 10*3/uL (ref 0.7–4.0)
MCH: 29.2 pg (ref 26.0–34.0)
MCHC: 32.6 g/dL (ref 30.0–36.0)
MCV: 89.7 fL (ref 80.0–100.0)
Monocytes Absolute: 0.9 10*3/uL (ref 0.1–1.0)
Monocytes Relative: 17 %
Neutro Abs: 3.6 10*3/uL (ref 1.7–7.7)
Neutrophils Relative %: 67 %
Platelets: 311 10*3/uL (ref 150–400)
RBC: 4.55 MIL/uL (ref 4.22–5.81)
RDW: 13.9 % (ref 11.5–15.5)
WBC: 5.4 10*3/uL (ref 4.0–10.5)
nRBC: 0 % (ref 0.0–0.2)

## 2023-04-15 LAB — COMPREHENSIVE METABOLIC PANEL
ALT: 23 U/L (ref 0–44)
AST: 26 U/L (ref 15–41)
Albumin: 3.9 g/dL (ref 3.5–5.0)
Alkaline Phosphatase: 54 U/L (ref 38–126)
Anion gap: 10 (ref 5–15)
BUN: 31 mg/dL — ABNORMAL HIGH (ref 8–23)
CO2: 29 mmol/L (ref 22–32)
Calcium: 9.7 mg/dL (ref 8.9–10.3)
Chloride: 101 mmol/L (ref 98–111)
Creatinine, Ser: 1.14 mg/dL (ref 0.61–1.24)
GFR, Estimated: >60 mL/min (ref >60)
Glucose, Bld: 103 mg/dL — ABNORMAL HIGH (ref 70–99)
Potassium: 4.1 mmol/L (ref 3.5–5.1)
Sodium: 140 mmol/L (ref 135–145)
Total Bilirubin: 0.5 mg/dL (ref <1.2)
Total Protein: 7.1 g/dL (ref 6.5–8.1)

## 2023-04-15 MED ORDER — DEXAMETHASONE SODIUM PHOSPHATE 10 MG/ML IJ SOLN
8.0000 mg | Freq: Four times a day (QID) | INTRAMUSCULAR | Status: DC
  Administered 2023-04-16 (×3): 8 mg via INTRAVENOUS
  Filled 2023-04-15 (×5): qty 0.8

## 2023-04-15 MED ORDER — SODIUM CHLORIDE 0.9% FLUSH
10.0000 mL | Freq: Three times a day (TID) | INTRAVENOUS | Status: DC
  Administered 2023-04-16 (×2): 10 mL via INTRAVENOUS

## 2023-04-15 MED ORDER — SODIUM CHLORIDE 0.45 % IV SOLN: INTRAVENOUS | Status: DC

## 2023-04-15 MED ORDER — POLYETHYLENE GLYCOL 3350 17 G PO PACK: 17.0000 g | PACK | Freq: Every day | ORAL | Status: DC | PRN

## 2023-04-15 MED ORDER — DEXTROSE 5 % AND 0.45 % NACL IV BOLUS
500.0000 mL | Freq: Once | INTRAVENOUS | Status: AC
  Administered 2023-04-16: 500 mL via INTRAVENOUS

## 2023-04-15 MED ORDER — ENOXAPARIN SODIUM 40 MG/0.4ML IJ SOSY
40.0000 mg | PREFILLED_SYRINGE | Freq: Every day | INTRAMUSCULAR | Status: DC
  Filled 2023-04-15: qty 0.4

## 2023-04-15 MED ORDER — DEXAMETHASONE SODIUM PHOSPHATE 10 MG/ML IJ SOLN
10.0000 mg | Freq: Once | INTRAMUSCULAR | Status: AC
  Administered 2023-04-15: 10 mg via INTRAVENOUS
  Filled 2023-04-15: qty 1

## 2023-04-15 MED ORDER — INSULIN ASPART 100 UNIT/ML IJ SOLN
2.0000 [IU] | Freq: Four times a day (QID) | INTRAMUSCULAR | Status: DC
  Administered 2023-04-16: 2 [IU] via SUBCUTANEOUS
  Administered 2023-04-16 (×3): 4 [IU] via SUBCUTANEOUS

## 2023-04-15 MED ORDER — DOCUSATE SODIUM 100 MG PO CAPS: 100.0000 mg | ORAL_CAPSULE | Freq: Two times a day (BID) | ORAL | Status: DC | PRN

## 2023-04-15 MED ORDER — FAMOTIDINE IN NACL 20-0.9 MG/50ML-% IV SOLN
20.0000 mg | INTRAVENOUS | Status: DC
  Administered 2023-04-16: 20 mg via INTRAVENOUS
  Filled 2023-04-15: qty 50

## 2023-04-15 MED ORDER — LEVOTHYROXINE SODIUM 25 MCG PO TABS
125.0000 ug | ORAL_TABLET | Freq: Every evening | ORAL | Status: DC
Start: 2023-04-16 — End: 2023-04-16
  Administered 2023-04-16: 125 ug
  Filled 2023-04-15: qty 1

## 2023-04-15 MED ORDER — RACEPINEPHRINE HCL 2.25 % IN NEBU
0.5000 mL | INHALATION_SOLUTION | Freq: Once | RESPIRATORY_TRACT | Status: AC
  Administered 2023-04-15: 0.5 mL via RESPIRATORY_TRACT
  Filled 2023-04-15: qty 0.5

## 2023-04-15 MED ORDER — RACEPINEPHRINE HCL 2.25 % IN NEBU
0.5000 mL | INHALATION_SOLUTION | RESPIRATORY_TRACT | Status: DC | PRN
  Administered 2023-04-16: 0.5 mL via RESPIRATORY_TRACT
  Filled 2023-04-15: qty 0.5

## 2023-04-15 NOTE — Progress Notes (Signed)
Swallow study ordered (FEES) to evaluate swallow

## 2023-04-15 NOTE — Anesthesia Postprocedure Evaluation (Signed)
Anesthesia Post Note  Patient: Todd [name]  Procedure(s) Performed: LARYNGOSCOPY WITH BRONCHOSCOPY AND BALLOON DILATION MICROLARYNGOSCOPY WITH VOCAL CORD INJECTION (Bilateral) RIGID BRONCHOSCOPY MYRINGOTOMY WITH TUBE PLACEMENT (Right)     Patient location during evaluation: PACU Anesthesia Type: General Level of consciousness: awake and alert Pain management: pain level controlled Vital Signs Assessment: post-procedure vital signs reviewed and stable Respiratory status: spontaneous breathing, nonlabored ventilation, respiratory function stable and patient connected to nasal cannula oxygen Cardiovascular status: blood pressure returned to baseline and stable Postop Assessment: no apparent nausea or vomiting Anesthetic complications: no   No notable events documented.  Last Vitals:  Vitals:   04/14/23 1400 04/14/23 1415  BP: (!) 166/100 (!) 160/99  Pulse: 63 62  Resp: 15 18  Temp:  36.7 C  SpO2: 95% 96%    Last Pain:  Vitals:   04/14/23 1415  TempSrc:   PainSc: 0-No pain                 Mariann Barter

## 2023-04-15 NOTE — H&P (Signed)
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 Yesterday he had direct microlaryngoscopy, rigid bronchoscopy, bilateral vocal fold injection augmentation, EGD and balloon dilation of UES/CP segment, myringotomy and ventilating T-tube placement. He did not swallow postop steroids. He presented to ED with stridor started last night, getting worse, and developed SOB since 3 pm today. He has lost ability to phonate 3 pm today.  Denied f/c/r, CP, wheezing, LL edema, rash, N/V/D, abd pain, and URT symptoms.  Denied smoking. Was 94% on RA and was placed on 2 L per medics for comfort.   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 rate 13, height 5\' 9"  (1.753 m), weight 72.6 kg, SpO2 94%.       No intake or output data in the 24 hours ending 04/15/23 2245 Filed Weights   04/15/23 2022  Weight: 72.6 kg    Examination: General: alert, oriented x4, and comfortable. On 2 L [place]. SpO2 100%.  Mild stridor  HENT: PERLA, pale pharyngeal mucosa. NL oral mucosa. No LNE or thyromegaly. No JVD Lungs: symmetrical air entry bilaterally. No crackles or wheezing Cardiovascular: NL S1/S2. No m/g/r Abdomen: no distension or tenderness Extremities: no edema. 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 and chemoRx 2017  Steroids Racemic Epi neb PRN ISS NPO ENT eval (seen in ED)  Dysphagia and aspiration (TF through PEG) NPO for now IVF Resume TF in am if stridor is improving  Right sided eustachian tube dysfunction and chronic middle ear effusion, s/p myringotomy and ventilating T-tube placement    HTN and orthostatic hypotension -Mintor Failed Florinef due to elevated BP Amlodipine was d/c due to hypotension   Hypothyroidism -Resume meds  Dyslipidemia -Hold med for now  GERD -Resume H2-blocker  Best Practice (right click and "Reselect all SmartList Selections" daily)   Diet/type: NPO DVT prophylaxis: enoxaparin (LOVENOX) injection 40 mg Start: 04/15/23 2300 SCDs Start: 04/15/23 2233   Pressure ulcer(s): not present on admission  GI prophylaxis: H2B Lines: N/A Foley:  N/A Code Status:  full code Last date of multidisciplinary goals of care discussion []   Labs   CBC: Recent Labs  Lab 04/15/23 2203  WBC 5.4  NEUTROABS 3.6  HGB 13.3  HCT 40.8  MCV 89.7  PLT 311    Basic Metabolic Panel: No results for input(s): "NA", "K", "CL", "CO2", "GLUCOSE", "  BUN", "CREATININE", "CALCIUM", "MG", "PHOS" in the last 168 hours. GFR: Estimated Creatinine Clearance: 49.7 mL/min (A) (by C-G formula based on SCr of 1.54 mg/dL (H)). Recent Labs  Lab 04/15/23 2203  WBC 5.4    Liver Function Tests: No results  for input(s): "AST", "ALT", "ALKPHOS", "BILITOT", "PROT", "ALBUMIN" in the last 168 hours. No results for input(s): "LIPASE", "AMYLASE" in the last 168 hours. No results for input(s): "AMMONIA" in the last 168 hours.  ABG    Component Value Date/Time   TCO2 26 05/02/2022 0907     Coagulation Profile: No results for input(s): "INR", "PROTIME" in the last 168 hours.  Cardiac Enzymes: No results for input(s): "CKTOTAL", "CKMB", "CKMBINDEX", "TROPONINI" in the last 168 hours.  HbA1C: No results found for: "HGBA1C"  CBG: No results for input(s): "GLUCAP" in the last 168 hours.  Review of Systems:   Review of Systems  Constitutional:  Negative for chills, diaphoresis, fever, malaise/fatigue and weight loss.  Respiratory:  Positive for shortness of breath and stridor. Negative for cough, hemoptysis, sputum production and wheezing.   Cardiovascular:  Negative for chest pain, palpitations, orthopnea, leg swelling and PND.  Gastrointestinal:  Negative for abdominal pain, nausea and vomiting.  Genitourinary:  Negative for dysuria, frequency and urgency.  Skin:  Negative for itching and rash.     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   Surgical History:   Past Surgical History:  Procedure Laterality Date   INGUINAL HERNIA REPAIR Left 05/02/2022   Procedure: OPEN LEFT INGUINAL HERNIA REPAIR WITH MESH;   Surgeon: Berna Bue, MD;  Location: [facility];  Service: General;  Laterality: Left;   IR GASTROSTOMY TUBE MOD SED  11/07/2022   IR GASTROSTOMY TUBE REMOVAL  01/30/2017   IR GENERIC HISTORICAL  03/20/2016   IR US GUIDE VASC ACCESS RIGHT 03/20/2016 Simonne Come, MD [facility]   IR GENERIC HISTORICAL  03/20/2016   IR FLUORO GUIDE PORT INSERTION RIGHT 03/20/2016 Simonne Come, MD [facility]   IR GENERIC HISTORICAL  03/20/2016   IR GASTROSTOMY TUBE MOD SED 03/20/2016 Simonne Come, MD [facility]   IR REMOVAL TUN ACCESS W/ PORT W/O FL MOD SED  01/30/2017   LARYNGOSCOPY WITH BRONCHOSCOPY AND BALLOON DILATION N/A 04/14/2023   Procedure: LARYNGOSCOPY WITH BRONCHOSCOPY AND BALLOON DILATION;  Surgeon: Ashok Croon, MD;  Location: [facility];  Service: ENT;  Laterality: N/A;   MICROLARYNGOSCOPY W/VOCAL CORD INJECTION Bilateral 04/14/2023   Procedure: MICROLARYNGOSCOPY WITH VOCAL CORD INJECTION;  Surgeon: Ashok Croon, MD;  Location: [facility];  Service: ENT;  Laterality: Bilateral;   MYRINGOTOMY WITH TUBE PLACEMENT Right 04/14/2023   Procedure: MYRINGOTOMY WITH TUBE PLACEMENT;  Surgeon: Ashok Croon, MD;  Location: [facility];  Service: ENT;  Laterality: Right;   RIGID BRONCHOSCOPY N/A 04/14/2023   Procedure: RIGID BRONCHOSCOPY;  Surgeon: Ashok Croon, MD;  Location: [facility];  Service: ENT;  Laterality: N/A;     Social History:   reports that he has never smoked. He has never used smokeless tobacco. He reports that he does  not currently use alcohol. He reports that he does not use drugs.   Family History:  His family history includes Asthma in his son; Cancer in his maternal grandmother; Dementia in his father; Diabetes in his father.   Allergies Allergies  Allergen Reactions   Phenergan [Promethazine Hcl]     Got really nervous and shaky..   Bactrim [Sulfamethoxazole-Trimethoprim] Other (See Comments)    Flu like symptoms    Tamsulosin Other (See Comments)    Caused orthostatic  hypotension per pt     Home Medications  Prior to Admission medications   Medication Sig Start Date End Date Taking? Authorizing Provider  fenofibrate (TRICOR) 145 MG tablet Take 145mg  tablet per tube every evening   Yes [provider]  levothyroxine (SYNTHROID) 125 MCG tablet Place 125 mcg into feeding tube every evening.   Yes [provider]  oxyCODONE (ROXICODONE) 5 MG/5ML solution Take 5 mLs (5 mg total) by mouth every 6 (six) hours as needed for up to 3 days for severe pain (pain score 7-10). For post-operative pain discomfort not controlled with Mobic and Tylenol Patient taking differently: Place 5 mg into feeding tube every 6 (six) hours as needed for severe pain (pain score 7-10). For post-operative pain discomfort not controlled with Mobic and Tylenol 04/14/23 04/17/23 Yes Soldatova, Eusebio Friendly, MD  acetaminophen (TYLENOL) 500 MG tablet Take 1,000 mg by mouth daily. LIQUID FORM ONLY    [provider]  amoxicillin-clavulanate (AUGMENTIN) 600-42.9 MG/5ML suspension Take 5 mLs (600 mg total) by mouth 2 (two) times daily for 10 days. For post-operative infection prophylaxis 04/14/23 04/24/23  Ashok Croon, MD  cetirizine (ZYRTEC) 10 MG tablet Take 10 mg by mouth daily.    [provider]  Coenzyme Q10 (COQ-10) 200 MG CAPS Take 200 mg by mouth daily.    [provider]  famotidine (PEPCID) 20 MG tablet Take 20 mg by mouth at bedtime.    [provider]  fluticasone (FLONASE) 50 MCG/ACT nasal spray Place 2 sprays into both nostrils daily. 03/16/23   Ashok Croon, MD  glucosamine-chondroitin 500-400 MG tablet Take 2 tablets by mouth every morning. Chewables    [provider]  meloxicam (MOBIC) 7.5 MG tablet Take 7.5 mg by mouth daily. 01/16/23   [provider]  methylPREDNISolone (MEDROL DOSEPAK) 4 MG TBPK tablet Take with signs of chronic sinusitis and take as directed 04/14/23   Ashok Croon, MD     Critical care  time: 52 min

## 2023-04-15 NOTE — ED Notes (Signed)
ED TO INPATIENT HANDOFF REPORT  ED Nurse Name and Phone #: Joselyn Glassman [phone])  S Name/Age/Gender Todd [name] 62 y.o. male Room/Bed: 004C/004C  Code Status   Code Status: Full Code  Home/SNF/Other Home Patient oriented to: self, place, time, and situation Is this baseline? Yes   Triage Complete: Triage complete  Chief Complaint Vocal cord edema [J38.4]  Triage Note Pt complaining of having some shortness of breath today.Yesterday he had a rigid bronchoscopy with vocal cord paralysis. Was doing fine today until an hour and a half ago when he started experiencing stridor. Was 94% room air and was placed on 2 ltrs per medics for comfort.   Allergies Allergies  Allergen Reactions   Phenergan [Promethazine Hcl]     Got really nervous and shaky..   Bactrim [Sulfamethoxazole-Trimethoprim] Other (See Comments)    Flu like symptoms    Tamsulosin Other (See Comments)    Caused orthostatic hypotension per pt    Level of Care/Admitting Diagnosis ED Disposition     ED Disposition  Admit   Condition  --   Comment  Hospital Area: [facility] [[id number]]  Level of Care: ICU [6]  May admit patient to [facility] or [facility] if equivalent level of care is available:: Yes  Covid Evaluation: Asymptomatic - no recent exposure (last 10 days) testing not required  Diagnosis: Vocal cord edema [536644]  Admitting Physician: Patrici Ranks [[id number]]  Attending Physician: Patrici Ranks [[id number]]  Certification:: I certify this patient will need inpatient services for at least 2 midnights  Expected Medical Readiness: 04/18/2023          B Medical/Surgery History Past Medical History:  Diagnosis Date   Acquired hypothyroidism    secondary to radiation //   followed by pcp   Arthritis    shoulders, neck, knees, feet   Bilateral sensorineural hearing loss    due to radiation   Decreased range of motion of neck    due to 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   Denervation atrophy of muscle    tongue ,  chronic ,  due to radiation injury   (neurologist-- dr aKate Sable ([facility])  EMG in care everywhere done 03-11-2022   Frequency of urination    GERD (gastroesophageal reflux disease)    History of cancer chemotherapy 2017   tonsil cancer   03-24-2016  to 05-06-2016   History of external beam radiation therapy    03-24-2016  to 05-15-2016  Left Tonsil and Bilateral Neck  @ [facility]   HLD (hyperlipidemia)    Hoarseness, chronic    due to radiation   Hyperlipidemia, mixed    Hypertension    pt states he has not had HTN since June 2024 (he has not taken BP meds since then since his BP has been on the lower side)   Inguinal hernia, left    Lower facial weakness    intermittant left lower facial weakness   Numbness and tingling in left arm    intermittently per pt   Oropharyngeal dysphagia    hx neck radiation for left tonsil cancer ;  Speech therapy, regular diet with precautions swallows okay   Orthostatic hypotension    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   followed by ENT dr bates/  oncologist--- dr Bertis Ruddy;  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;  completed chemo 05-06-2016 and completed radiation of left tonsil/ bilateral neck 05-15-2016  w/ radiation injury to tongue (  denervation chronic)   Wears glasses    Wears hearing aid in both ears    Past Surgical History:  Procedure Laterality Date   INGUINAL HERNIA REPAIR Left 05/02/2022   Procedure: OPEN LEFT INGUINAL HERNIA REPAIR WITH MESH;  Surgeon: Berna Bue, MD;  Location: [facility];  Service: General;  Laterality: Left;   IR GASTROSTOMY TUBE MOD SED  11/07/2022   IR GASTROSTOMY TUBE REMOVAL  01/30/2017   IR GENERIC HISTORICAL  03/20/2016   IR US GUIDE VASC ACCESS RIGHT 03/20/2016 Simonne Come, MD [facility]   IR GENERIC HISTORICAL  03/20/2016   IR FLUORO GUIDE PORT INSERTION RIGHT 03/20/2016 Simonne Come, MD [facility]   IR GENERIC  HISTORICAL  03/20/2016   IR GASTROSTOMY TUBE MOD SED 03/20/2016 Simonne Come, MD [facility]   IR REMOVAL TUN ACCESS W/ PORT W/O FL MOD SED  01/30/2017   LARYNGOSCOPY WITH BRONCHOSCOPY AND BALLOON DILATION N/A 04/14/2023   Procedure: LARYNGOSCOPY WITH BRONCHOSCOPY AND BALLOON DILATION;  Surgeon: Ashok Croon, MD;  Location: [facility];  Service: ENT;  Laterality: N/A;   MICROLARYNGOSCOPY W/VOCAL CORD INJECTION Bilateral 04/14/2023   Procedure: MICROLARYNGOSCOPY WITH VOCAL CORD INJECTION;  Surgeon: Ashok Croon, MD;  Location: [facility];  Service: ENT;  Laterality: Bilateral;   MYRINGOTOMY WITH TUBE PLACEMENT Right 04/14/2023   Procedure: MYRINGOTOMY WITH TUBE PLACEMENT;  Surgeon: Ashok Croon, MD;  Location: [facility];  Service: ENT;  Laterality: Right;   RIGID BRONCHOSCOPY N/A 04/14/2023   Procedure: RIGID BRONCHOSCOPY;  Surgeon: Ashok Croon, MD;  Location: [facility];  Service: ENT;  Laterality: N/A;     A IV Location/Drains/Wounds Patient Lines/Drains/Airways Status     Active Line/Drains/Airways     Name Placement date Placement time Site Days   Peripheral IV 04/15/23 18 G Left Forearm 04/15/23  --  Forearm  less than 1   Gastrostomy/Enterostomy Gastrostomy 22 Fr. LUQ 11/07/22  1534  LUQ  159   Myringotomy Tube 04/14/23  1250  Right Ear  1            Intake/Output Last 24 hours No intake or output data in the 24 hours ending 04/15/23 2245  Labs/Imaging Results for orders placed or performed during the hospital encounter of 04/15/23 (from the past 48 hour(s))  CBC with Differential     Status: None   Collection Time: 04/15/23 10:03 PM  Result Value Ref Range   WBC 5.4 4.0 - 10.5 K/uL   RBC 4.55 4.22 - 5.81 MIL/uL   Hemoglobin 13.3 13.0 - 17.0 g/dL   HCT 02.7 25.3 - 66.4 %   MCV 89.7 80.0 - 100.0 fL   MCH 29.2 26.0 - 34.0 pg   MCHC 32.6 30.0 - 36.0 g/dL   RDW 40.3 47.4 - 25.9 %   Platelets 311 150 - 400 K/uL   nRBC 0.0 0.0 - 0.2 %   Neutrophils Relative % 67 %   Neutro Abs  3.6 1.7 - 7.7 K/uL   Lymphocytes Relative 13 %   Lymphs Abs 0.7 0.7 - 4.0 K/uL   Monocytes Relative 17 %   Monocytes Absolute 0.9 0.1 - 1.0 K/uL   Eosinophils Relative 2 %   Eosinophils Absolute 0.1 0.0 - 0.5 K/uL   Basophils Relative 1 %   Basophils Absolute 0.0 0.0 - 0.1 K/uL   Immature Granulocytes 0 %   Abs Immature Granulocytes 0.01 0.00 - 0.07 K/uL    Comment: Performed at [facility], [address] [place], [place] [address]   DG Chest Port 1 View  Result Date: 04/15/2023 CLINICAL DATA:  Shortness of breath today. Bronchoscopy today. Patient subsequently developed stridor. EXAM: PORTABLE CHEST 1 VIEW COMPARISON:  CT 04/06/2023 FINDINGS: Heart size and pulmonary vascularity are normal. Lungs are clear. No pleural effusions. No pneumothorax. Mediastinal contours appear intact. Mediastinal airway appears patent on single view. Degenerative changes in the spine. IMPRESSION: No active disease. Electronically Signed   By: Burman Nieves M.D.   On: 04/15/2023 22:15    Pending Labs Unresulted Labs (From admission, onward)     Start     Ordered   04/15/23 2236  Magnesium  Add-on,   AD        04/15/23 2235   04/15/23 2233  HIV Antibody (routine testing w rflx)  (HIV Antibody (Routine testing w reflex) panel)  Once,   R        04/15/23 2234   04/15/23 2115  Comprehensive metabolic panel  Once,   STAT        04/15/23 2114            Vitals/Pain Today's Vitals   04/15/23 2215 04/15/23 2230 04/15/23 2234 04/15/23 2245  BP: (!) 166/99 (!) 144/99    Pulse: 80 78 74 75  Resp: 15 13 (!) 21 13  Temp:      TempSrc:      SpO2: 100% 94% 95% 97%  Weight:      Height:      PainSc:        Isolation Precautions No active isolations  Medications Medications  docusate sodium (COLACE) capsule 100 mg (has no administration in time range)  polyethylene glycol (MIRALAX / GLYCOLAX) packet 17 g (has no administration in time range)  enoxaparin (LOVENOX) injection 40 mg (has no  administration in time range)  insulin aspart (novoLOG) injection 2-6 Units (has no administration in time range)  0.45 % sodium chloride infusion (has no administration in time range)  dexamethasone (DECADRON) injection 10 mg (10 mg Intravenous Given 04/15/23 2154)  Racepinephrine HCl 2.25 % nebulizer solution 0.5 mL (0.5 mLs Nebulization Given 04/15/23 2154)    Mobility walks     Focused Assessments Pulmonary Assessment Handoff:  Lung sounds: Bilateral Breath Sounds: Stridor L Breath Sounds: Expiratory wheezes R Breath Sounds: Expiratory wheezes O2 Device: Nasal Cannula O2 Flow Rate (L/min): 2 L/min    R Recommendations: See Admitting Provider Note  Report given to:   Additional Notes: Pt received dose of Racemic Epi.  Currently on 2L [place] at 100% per provider; pt was 96% on room air following treatment.

## 2023-04-15 NOTE — ED Provider Notes (Signed)
[place] EMERGENCY DEPARTMENT AT [facility] Provider Note   CSN: [id number] Arrival date & time: 04/15/23  2009     History  Chief Complaint  Patient presents with   Shortness of Breath    Todd [name] is a 62 y.o. male.  This is a 66 gentleman with history of tonsillar cancer about 7 years ago.  The patient was treated with radiation, and did relatively well after his treatment.  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  He had a procedure with otolaryngology yesterday where they injected the vocal cords, conducted balloon dilatation of the esophagus.  Yesterday night the patient's wife notes that he had some stridor, but this resolved this a.m.  Around 5 PM today the patient again began having stridor and shortness of breath prompting his ED visit.  He has been unable to take his steroids as he has had swallowing difficulties.    Shortness of Breath Associated symptoms: no abdominal pain, no chest pain and no fever        Home Medications Prior to Admission medications   Medication Sig Start Date End Date Taking? Authorizing Provider  acetaminophen (TYLENOL) 500 MG tablet Place 1,000 mg into feeding tube daily. LIQUID FORM ONLY   Yes [provider]  amoxicillin-clavulanate (AUGMENTIN) 600-42.9 MG/5ML suspension Take 5 mLs (600 mg total) by mouth 2 (two) times daily for 10 days. For post-operative infection prophylaxis Patient taking differently: Place 600 mg into feeding tube 2 (two) times daily. For post-operative infection prophylaxis 04/14/23 04/24/23 Yes Ashok Croon, MD  cetirizine (ZYRTEC) 10 MG tablet Place 10 mg into feeding tube daily.   Yes [provider]  Coenzyme Q10 (COQ-10) 200 MG CAPS Give 200 mg by tube daily.   Yes [provider]  famotidine (PEPCID) 20 MG tablet Place 20 mg into feeding tube at bedtime.   Yes [provider]   fenofibrate (TRICOR) 145 MG tablet Take 145mg  tablet per tube every evening   Yes [provider]  fluticasone (FLONASE) 50 MCG/ACT nasal spray Place 2 sprays into both nostrils daily. 03/16/23  Yes Ashok Croon, MD  glucosamine-chondroitin 500-400 MG tablet Take 2 tablets by mouth every morning. Chewables   Yes [provider]  levothyroxine (SYNTHROID) 125 MCG tablet Place 125 mcg into feeding tube every evening.   Yes [provider]  meloxicam (MOBIC) 7.5 MG tablet Take 7.5 mg by mouth daily. 01/16/23  Yes [provider]  methylPREDNISolone (MEDROL DOSEPAK) 4 MG TBPK tablet Take with signs of chronic sinusitis and take as directed 04/14/23  Yes Soldatova, Eusebio Friendly, MD  oxyCODONE (ROXICODONE) 5 MG/5ML solution Take 5 mLs (5 mg total) by mouth every 6 (six) hours as needed for up to 3 days for severe pain (pain score 7-10). For post-operative pain discomfort not controlled with Mobic and Tylenol Patient taking differently: Place 5 mg into feeding tube every 6 (six) hours as needed for severe pain (pain score 7-10). For post-operative pain discomfort not controlled with Mobic and Tylenol 04/14/23 04/17/23 Yes Ashok Croon, MD      Allergies    Phenergan [promethazine hcl], Bactrim [sulfamethoxazole-trimethoprim], and Tamsulosin    Review of Systems   Review of Systems  Constitutional:  Negative for chills and fever.  Respiratory:  Positive for shortness of breath and stridor.   Cardiovascular:  Negative for chest pain.  Gastrointestinal:  Negative for abdominal pain.    Physical Exam Updated  Vital Signs BP (!) 144/99   Pulse 75   Temp 98.9 F (37.2 C) (Oral)   Resp 13   Ht 5\' 9"  (1.753 m)   Wt 72.6 kg   SpO2 97%   BMI 23.63 kg/m  Physical Exam Cardiovascular:     Rate and Rhythm: Normal rate and regular rhythm.  Pulmonary:     Comments: Audible biphasic stridor, difficult to auscultate lung noises due to referred upper airway noises.   Increased work of breathing on nasal cannula, in no acute respiratory distress at this time. Abdominal:     Comments: Soft, non distended, no tenderness to palpation  Neurological:     Mental Status: He is alert.     ED Results / Procedures / Treatments   Labs (all labs ordered are listed, but only abnormal results are displayed) Labs Reviewed  COMPREHENSIVE METABOLIC PANEL - Abnormal; Notable for the following components:      Result Value   Glucose, Bld 103 (*)    BUN 31 (*)    All other components within normal limits  CBC WITH DIFFERENTIAL/PLATELET  HIV ANTIBODY (ROUTINE TESTING W REFLEX)  MAGNESIUM    EKG None  Radiology DG Chest Port 1 View  Result Date: 04/15/2023 CLINICAL DATA:  Shortness of breath today. Bronchoscopy today. Patient subsequently developed stridor. EXAM: PORTABLE CHEST 1 VIEW COMPARISON:  CT 04/06/2023 FINDINGS: Heart size and pulmonary vascularity are normal. Lungs are clear. No pleural effusions. No pneumothorax. Mediastinal contours appear intact. Mediastinal airway appears patent on single view. Degenerative changes in the spine. IMPRESSION: No active disease. Electronically Signed   By: Burman Nieves M.D.   On: 04/15/2023 22:15    Procedures Procedures    Medications Ordered in ED Medications  docusate sodium (COLACE) capsule 100 mg (has no administration in time range)  polyethylene glycol (MIRALAX / GLYCOLAX) packet 17 g (has no administration in time range)  enoxaparin (LOVENOX) injection 40 mg (has no administration in time range)  insulin aspart (novoLOG) injection 2-6 Units (has no administration in time range)  0.45 % sodium chloride infusion (has no administration in time range)  dexamethasone (DECADRON) injection 10 mg (10 mg Intravenous Given 04/15/23 2154)  Racepinephrine HCl 2.25 % nebulizer solution 0.5 mL (0.5 mLs Nebulization Given 04/15/23 2154)    ED Course/ Medical Decision Making/ A&P                                  Medical Decision Making This is a 62 year old gentleman presenting following ENT procedure yesterday with new onset shortness of breath and stridor.  Likely secondary to injection of the vocal cords, and had subsequent swelling.  Patient has been unable to take their steroids following the procedure, which certainly does not help the situation.  We will go ahead and begin IV steroids as well as racemic epinephrine this patient with stridor. ENT consulted, evaluated patient at bedside.  Per ENT recommending ICU admission for monitoring, as patient's airways currently unstable.  Agrees with plan for IV steroids as well as racemic epinephrine, and close monitoring of the patient's airway.  Amount and/or Complexity of Data Reviewed Labs: ordered. Radiology: ordered.  Risk OTC drugs. Prescription drug management. Decision regarding hospitalization.          Final Clinical Impression(s) / ED Diagnoses Final diagnoses:  Stridor  Biphasic stridor  Airway problem    Rx / DC Orders ED Discharge Orders  None         Lovie Macadamia, MD 04/15/23 2300    Charlynne Pander, MD 04/16/23 1032

## 2023-04-15 NOTE — ED Triage Notes (Signed)
Pt complaining of having some shortness of breath today.Yesterday he had a rigid bronchoscopy with vocal cord paralysis. Was doing fine today until an hour and a half ago when he started experiencing stridor. Was 94% room air and was placed on 2 ltrs per medics for comfort.

## 2023-04-15 NOTE — Consult Note (Signed)
Reason for Consult:stridor Referring Physician: Dr Salvatore Marvel is an 62 y.o. male.  HPI: hx of bronchscopy and vocal cord injection yesterday. He started having increased stridor tonight and brought by EMS. He had slight stridor last night. He has not really been in any distress. 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 Kate Sable ([facility] [place])  EMG in care everywhere done 03-11-2022   Frequency of urination    GERD (gastroesophageal reflux disease)    History of cancer chemotherapy 2017   tonsil cancer   03-24-2016  to 05-06-2016   History of external beam radiation therapy    03-24-2016  to 05-15-2016  Left Tonsil and Bilateral Neck  @ [facility]   HLD (hyperlipidemia)    Hoarseness, chronic    due to radiation   Hyperlipidemia, mixed    Hypertension    pt states he has not had HTN since June 2024 (he has not taken BP meds since then since his BP has been on the lower side)   Inguinal hernia, left    Lower facial weakness    intermittant left lower facial weakness   Numbness and tingling in left arm    intermittently per pt   Oropharyngeal dysphagia    hx neck radiation for left tonsil cancer ;  Speech therapy, regular diet with precautions swallows okay   Orthostatic hypotension    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   followed by ENT dr bates/  oncologist--- dr Bertis Ruddy;  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;  completed chemo 05-06-2016 and completed radiation of left tonsil/ bilateral neck 05-15-2016  w/ radiation injury to tongue (denervation  chronic)   Wears glasses    Wears hearing aid in both ears     Past Surgical History:  Procedure Laterality Date   INGUINAL HERNIA REPAIR Left 05/02/2022   Procedure: OPEN LEFT INGUINAL HERNIA REPAIR WITH MESH;  Surgeon: Berna Bue, MD;  Location: [facility] [place];  Service: General;  Laterality: Left;   IR GASTROSTOMY TUBE MOD SED  11/07/2022   IR GASTROSTOMY TUBE REMOVAL  01/30/2017   IR GENERIC HISTORICAL  03/20/2016   IR US GUIDE VASC ACCESS RIGHT 03/20/2016 Simonne Come, MD [facility]   IR GENERIC HISTORICAL  03/20/2016   IR FLUORO GUIDE PORT INSERTION RIGHT 03/20/2016 Simonne Come, MD [facility]   IR GENERIC HISTORICAL  03/20/2016   IR GASTROSTOMY TUBE MOD SED 03/20/2016 Simonne Come, MD [facility]   IR REMOVAL TUN ACCESS W/ PORT W/O FL MOD SED  01/30/2017   LARYNGOSCOPY WITH BRONCHOSCOPY AND BALLOON DILATION N/A 04/14/2023   Procedure: LARYNGOSCOPY WITH BRONCHOSCOPY AND BALLOON DILATION;  Surgeon: Ashok Croon, MD;  Location: [facility] OR;  Service: ENT;  Laterality: N/A;   MICROLARYNGOSCOPY W/VOCAL CORD INJECTION Bilateral 04/14/2023   Procedure: MICROLARYNGOSCOPY WITH VOCAL CORD INJECTION;  Surgeon: Ashok Croon, MD;  Location: [facility] OR;  Service: ENT;  Laterality: Bilateral;   MYRINGOTOMY WITH TUBE PLACEMENT Right  04/14/2023   Procedure: MYRINGOTOMY WITH TUBE PLACEMENT;  Surgeon: Ashok Croon, MD;  Location: [facility] OR;  Service: ENT;  Laterality: Right;   RIGID BRONCHOSCOPY N/A 04/14/2023   Procedure: RIGID BRONCHOSCOPY;  Surgeon: Ashok Croon, MD;  Location: [facility] OR;  Service: ENT;  Laterality: N/A;    Family History  Problem Relation Age of Onset   Dementia Father    Diabetes Father    Cancer Maternal Grandmother        unknown ca   Asthma Son     Social History:  reports that he has never smoked. He has never used smokeless tobacco. He reports that he does not currently use alcohol. He reports that he does not use drugs.  Allergies:  Allergies  Allergen  Reactions   Phenergan [Promethazine Hcl]     Got really nervous and shaky..   Bactrim [Sulfamethoxazole-Trimethoprim] Other (See Comments)    Flu like symptoms    Tamsulosin Other (See Comments)    Caused orthostatic hypotension per pt    Medications: I have reviewed the patient's current medications.  No results found for this or any previous visit (from the past 48 hour(s)).  No results found.  ROS Blood pressure (!) 192/116, pulse 78, temperature 98.9 F (37.2 C), temperature source Oral, resp. rate 18, height 5\' 9"  (1.753 m), weight 72.6 kg, SpO2 100%. Physical Exam HENT:     Head: Normocephalic.     Mouth/Throat:     Mouth: Mucous membranes are moist.     Comments: He is sitting up comfortably with no stridor with calm breathing. His stridor is audible if he increases the rate. His sats are good. Voice is very hoarse and family says it is slightly worse than preop. Eyes:     Extraocular Movements: Extraocular movements intact.     Pupils: Pupils are equal, round, and reactive to light.  Musculoskeletal:     Cervical back: Normal range of motion.  Neurological:     Mental Status: He is alert.       Assessment/Plan: Stridor- he likely has reactive swelling from the manipulation and injection of vocal cords for his glottic incompetence and aspiration. [name] He is not in any distress but since this has progressed from last night he needs ICU admission. We talked about a scope but since nothing is going to be done different based on results will hold off. Steroids and racemic treatment will be given. He did not take his steroids that were given to him postop. He said he could not swallow them.   Suzanna Obey 04/15/2023, 9:38 PM

## 2023-04-16 ENCOUNTER — Other Ambulatory Visit (HOSPITAL_COMMUNITY): Payer: Self-pay

## 2023-04-16 ENCOUNTER — Encounter: Payer: Self-pay | Admitting: Hematology and Oncology

## 2023-04-16 DIAGNOSIS — R0603 Acute respiratory distress: Secondary | ICD-10-CM | POA: Diagnosis not present

## 2023-04-16 DIAGNOSIS — R061 Stridor: Principal | ICD-10-CM

## 2023-04-16 DIAGNOSIS — J3801 Paralysis of vocal cords and larynx, unilateral: Secondary | ICD-10-CM

## 2023-04-16 DIAGNOSIS — J384 Edema of larynx: Secondary | ICD-10-CM | POA: Diagnosis not present

## 2023-04-16 LAB — GLUCOSE, CAPILLARY
Glucose-Capillary: 135 mg/dL — ABNORMAL HIGH (ref 70–99)
Glucose-Capillary: 162 mg/dL — ABNORMAL HIGH (ref 70–99)
Glucose-Capillary: 172 mg/dL — ABNORMAL HIGH (ref 70–99)
Glucose-Capillary: 177 mg/dL — ABNORMAL HIGH (ref 70–99)
Glucose-Capillary: 197 mg/dL — ABNORMAL HIGH (ref 70–99)

## 2023-04-16 LAB — HIV ANTIBODY (ROUTINE TESTING W REFLEX): HIV Screen 4th Generation wRfx: NONREACTIVE

## 2023-04-16 LAB — MAGNESIUM: Magnesium: 2 mg/dL (ref 1.7–2.4)

## 2023-04-16 LAB — MRSA NEXT GEN BY PCR, NASAL: MRSA by PCR Next Gen: NOT DETECTED

## 2023-04-16 MED ORDER — MELOXICAM 7.5 MG PO TABS: 7.5000 mg | ORAL_TABLET | Freq: Every day | ORAL | Status: DC | PRN

## 2023-04-16 MED ORDER — FAMOTIDINE 20 MG PO TABS
20.0000 mg | ORAL_TABLET | Freq: Every day | ORAL | Status: DC
Start: 2023-04-17 — End: 2023-04-16

## 2023-04-16 MED ORDER — AMOXICILLIN-POT CLAVULANATE 875-125 MG PO TABS
1.0000 | ORAL_TABLET | Freq: Two times a day (BID) | ORAL | Status: DC
  Administered 2023-04-16: 1
  Filled 2023-04-16 (×2): qty 1

## 2023-04-16 MED ORDER — CHLORHEXIDINE GLUCONATE CLOTH 2 % EX PADS
6.0000 | MEDICATED_PAD | Freq: Every day | CUTANEOUS | Status: DC
  Administered 2023-04-16: 6 via TOPICAL

## 2023-04-16 MED ORDER — ORAL CARE MOUTH RINSE: 15.0000 mL | OROMUCOSAL | Status: DC | PRN

## 2023-04-16 MED ORDER — LANSOPRAZOLE 30 MG PO TBDD
30.0000 mg | DELAYED_RELEASE_TABLET | Freq: Two times a day (BID) | ORAL | 0 refills | Status: DC
  Filled 2023-04-16: qty 60, 30d supply, fill #0

## 2023-04-16 MED ORDER — ORAL CARE MOUTH RINSE
15.0000 mL | OROMUCOSAL | Status: DC
  Administered 2023-04-16 (×3): 15 mL via OROMUCOSAL

## 2023-04-16 MED ORDER — PANTOPRAZOLE SODIUM 40 MG PO TBEC
40.0000 mg | DELAYED_RELEASE_TABLET | Freq: Every day | ORAL | 0 refills | Status: DC
  Filled 2023-04-16: qty 60, 60d supply, fill #0

## 2023-04-16 NOTE — Plan of Care (Signed)

## 2023-04-16 NOTE — Discharge Summary (Signed)
Physician Discharge Summary         Patient ID: Todd [name] MRN: [id number] DOB/AGE: [birth year] 62 y.o.  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      Discharge summary    Pt presented to ED with stridor, SOB, and unable to phonate s/p direct microlaryngoscopy, rigid bronchoscopy, bilateral vocal fold injection augmentation, EGD and balloon dilation of UES/CP segment, myringotomy and ventilating T-tube placement the day prior. He was unable to swallow postop steroids.  Was 94% on RA and was placed on 2 L for comfort but quickly transitioned to RA. Seen in ED by ENT who recommended ICU admission for observation, IV decadron and racemic epi. Stridor and SOB resolved and pt remained stable to discharge home on PPI and steroid taper.   Discharge Plan by Active Problems    Stridor d/t vocal cord edema -start steroid taper previously prescribed by ENT -Protonix 40 mg BID crushed per tube and continue daily Pepcid  -continue Augmentin for post op infection prevention  -f/u with PCP on 04/22/23 -f/u with ENT on 04/23/23  Chronic and stable problems managed with home medications. No changes to home meds at discharge.   Significant Hospital tests/ studies  None Procedures   None Culture data/antimicrobials   N/A Consults  ENT as above    Discharge Exam: BP 98/61   Pulse 69   Temp 97.6 F (36.4 C) (Axillary)   Resp 13   Ht 5\' 9"  (1.753 m)   Wt 69.9 kg   SpO2 91%   BMI 22.76 kg/m   General: Well appearing 62 yo male  HENT: MMM, erythema of oropharynx without edema, voice is still hoarse Lungs: CTAB, normal WOB Cardiovascular: RRR Abdomen: soft, non tender, G tube in place without leakage, erythema or edema of surrounding  skin Extremities: no edema  Neuro: A&O Labs at discharge   Lab Results  Component Value Date   CREATININE 1.14 04/15/2023   BUN 31 (H) 04/15/2023   NA 140 04/15/2023   K 4.1 04/15/2023   CL 101 04/15/2023   CO2 29 04/15/2023   Lab Results  Component Value Date   WBC 5.4 04/15/2023   HGB 13.3 04/15/2023   HCT 40.8 04/15/2023   MCV 89.7 04/15/2023   PLT 311 04/15/2023   Lab Results  Component Value Date   ALT 23 04/15/2023   AST 26 04/15/2023   ALKPHOS 54 04/15/2023   BILITOT 0.5 04/15/2023   Lab Results  Component Value Date   INR 1.2 11/07/2022   INR 0.99 01/30/2017   INR 1.01 03/20/2016    Current radiological studies    DG Chest Port 1 View  Result Date: 04/15/2023 CLINICAL DATA:  Shortness of breath today. Bronchoscopy today. Patient subsequently developed stridor. EXAM: PORTABLE CHEST 1 VIEW COMPARISON:  CT 04/06/2023 FINDINGS: Heart size and pulmonary vascularity are normal. Lungs are clear. No pleural effusions. No pneumothorax. Mediastinal contours appear intact. Mediastinal airway appears patent on single view. Degenerative changes in the spine. IMPRESSION: No active disease. Electronically Signed   By: Burman Nieves M.D.   On: 04/15/2023 22:15    Disposition:    Discharge disposition: 01-Home or Self Care         Allergies as of 04/16/2023  Reactions   Phenergan [promethazine Hcl]    Got really nervous and shaky..   Bactrim [sulfamethoxazole-trimethoprim] Other (See Comments)   Flu like symptoms    Tamsulosin Other (See Comments)   Caused orthostatic hypotension per pt        Medication List     TAKE these medications    acetaminophen 500 MG tablet Commonly known as: TYLENOL Place 1,000 mg into feeding tube daily. LIQUID FORM ONLY   amoxicillin-clavulanate 600-42.9 MG/5ML suspension Commonly known as: AUGMENTIN Take 5 mLs (600 mg total) by mouth 2 (two) times daily for 10 days. For post-operative infection prophylaxis What  changed: how to take this   cetirizine 10 MG tablet Commonly known as: ZYRTEC Place 10 mg into feeding tube daily.   CoQ-10 200 MG Caps Give 200 mg by tube daily.   famotidine 20 MG tablet Commonly known as: PEPCID Place 20 mg into feeding tube at bedtime.   fenofibrate 145 MG tablet Commonly known as: TRICOR Take 145mg  tablet per tube every evening   fluticasone 50 MCG/ACT nasal spray Commonly known as: FLONASE Place 2 sprays into both nostrils daily.   glucosamine-chondroitin 500-400 MG tablet Take 2 tablets by mouth every morning. Chewables   levothyroxine 125 MCG tablet Commonly known as: SYNTHROID Place 125 mcg into feeding tube every evening.   meloxicam 7.5 MG tablet Commonly known as: MOBIC Take 1 tablet (7.5 mg total) by mouth daily as needed for pain. What changed:  when to take this reasons to take this   methylPREDNISolone 4 MG Tbpk tablet Commonly known as: MEDROL DOSEPAK Take with signs of chronic sinusitis and take as directed   oxyCODONE 5 MG/5ML solution Commonly known as: ROXICODONE Take 5 mLs (5 mg total) by mouth every 6 (six) hours as needed for up to 3 days for severe pain (pain score 7-10). For post-operative pain discomfort not controlled with Mobic and Tylenol What changed: how to take this   pantoprazole 40 MG tablet Commonly known as: PROTONIX Crush 1 tablet and place in tube daily         Follow-up appointment   With ENT 04/23/23 Discharge Condition:    stable    Signed: Erick Alley 04/16/2023, 5:07 PM

## 2023-04-16 NOTE — Progress Notes (Signed)
ENT Progress Note   POD#2 s/p DML b/l VF injection augmentation, EGD and balloon dilation of UES/PES segment 2/2 hx of pharyngoesophageal dysphagia in the setting of hx of stage IV oropharyngeal cancer s/p cXRT. Preop exam notable for left VF paralysis, and preop swallow study with aspiration, currently NPO and PEG tube dependent. He developed stridor and presented to ED. Seen by Dr Jearld Fenton last night, with no audible stridor on exam last night. Did well overnight, feels better this am. No stridor this am. He received IV steroids Dex 8 Q6hrs. He received racemip Epi in ED. On PPI BID.   Physical Exam   Preoperative diagnosis: L VF paralysis s/p b/l injection augmentation  Postoperative diagnosis:   Same  Procedure: Flexible fiberoptic laryngoscopy  Surgeon: Ashok Croon, MD  Anesthesia: Topical lidocaine and Afrin Complications: None Condition is stable throughout exam  Indications and consent:  The patient presents to the clinic with Indirect laryngoscopy view was incomplete. Thus it was recommended that they undergo a flexible fiberoptic laryngoscopy. All of the risks, benefits, and potential complications were reviewed with the patient preoperatively and verbal informed consent was obtained.  Procedure: The patient was seated upright in the clinic. Topical lidocaine and Afrin were applied to the nasal cavity. After adequate anesthesia had occurred, I then proceeded to pass the flexible telescope into the nasal cavity. The nasal cavity was patent without rhinorrhea or polyp. The nasopharynx was also patent without mass or lesion. The base of tongue was visualized and was normal. There were no signs of pooling of secretions in the piriform sinuses. The true vocal folds had evidence of previously seen L VF paralysis and evidence of injection augmentation, with mild edema. Subglottis was patent. There were no signs of glottic or supraglottic mucosal lesion or mass. There was moderate  interarytenoid pachydermia and post cricoid edema. The telescope was then slowly withdrawn and the patient tolerated the procedure throughout.  Studies Reviewed:  CXR last night  FINDINGS: Heart size and pulmonary vascularity are normal. Lungs are clear. No pleural effusions. No pneumothorax. Mediastinal contours appear intact. Mediastinal airway appears patent on single view. Degenerative changes in the spine.   IMPRESSION: No active disease.    A/P  Stridor in the setting of recent b/l VF injection augmentation 2 days ago, with scope exam showing mild VF edema. Improved from initial presentation following IV steroids and PPI with racemic epi given on arrival.   - flexible laryngoscopy with mild VF edema, previously seen L VF paralysis and evidence of injection augmentation - sx are likely 2/2 post-injection VF edema, which will improve on systemic steroids  - patient without stridor this am, doing well without oxygen support - CXR was unremarkable - continue IV steroids at current dose for 2 more doses - continue to monitor and if no worsening of sx, home on steroid taper and PPI BID - outpatient f/u with me in 1 week  - he was already referred to speech for FEES and this will be scheduled outpatient - ENT will follow while inpatient   [facility]

## 2023-04-16 NOTE — Progress Notes (Signed)
Pt discharged home with wife. V/S/S. Discharged packet review with pt and pt's wife. Pt voice no complaints at this time.

## 2023-04-16 NOTE — Discharge Instructions (Addendum)
Dear Todd [name],  Thank you for letting us participate in your care. You were hospitalized for  Vocal cord edema. You were treated with nebulizers and steroids.   POST-HOSPITAL & CARE INSTRUCTIONS Take your steroid taper as prescribed by your ENT provider and the new acid reflux medication given to you at the hospital which you can crush and give in tube.  We recommend scheduling a hospital follow up appointment with your primary care doctor within 2 weeks  Go to your follow up appointments (listed below) including apt with ENT next week   DOCTOR'S APPOINTMENT   Future Appointments  Date Time Provider Department Center  04/20/2023  2:45 PM Barron Alvine CCC-SLP [facility]  04/23/2023 10:15 AM Ashok Croon, MD [facility] None  04/29/2023  3:00 PM Ashok Croon, MD [facility] None  05/01/2023 12:00 PM Noreene Larsson, RD [facility] None  05/12/2023  1:00 PM Leroux-Martinez, Florestine Avers, AUD [facility] None  06/09/2023  2:00 PM Nahser, Deloris Ping, MD [facility]  10/06/2023  2:20 PM Lonie Peak, MD [facility] None     Take care and be well!  Family Medicine Teaching Service Inpatient Team [place]  [facility]  [address] [place], [place] [address] [phone]

## 2023-04-16 NOTE — Progress Notes (Signed)
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 Yesterday he had direct microlaryngoscopy, rigid bronchoscopy, bilateral vocal fold injection augmentation, EGD and balloon dilation of UES/CP segment, myringotomy and ventilating T-tube placement. He did not swallow postop steroids. He presented to ED with stridor started last night, getting worse, and developed SOB since 3 pm today. He has lost ability to phonate 3 pm today.  Denied f/c/r, CP, wheezing, LL edema, rash, N/V/D, abd pain, and URT symptoms.  Denied smoking. Was 94% on RA and was placed on 2 L per medics for comfort.   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 S/p decadron 10 mg IV and racemic epi neb   Interim History / Subjective:  Pt states the stridor has stopped and he feels better this morning. Denies any SOB. States, voice is not yet back at his baseline.     Objective   Blood pressure 129/77, pulse 68, temperature 98.7 F (37.1 C), temperature source Oral, resp. rate 13, height 5\' 9"  (1.753 m), weight 69.9 kg, SpO2 (!) 88%.         Intake/Output Summary (Last 24 hours) at 04/16/2023 0705 Last data filed at 04/16/2023 0500 Gross per 24 hour  Intake 231.65 ml  Output 300 ml  Net -68.35 ml   Filed Weights   04/15/23 2022 04/16/23 0041 04/16/23 0500  Weight: 72.6 kg 69.9 kg 69.9 kg    Examination: General: Well appearing 62 yo male  HENT: MMM, erythema of oropharynx without edema, voice is still hoarse Lungs: CTAB, normal WOB Cardiovascular: RRR Abdomen: soft, non tender, G tube in place without leakage, erythema or edema of surrounding skin Extremities: no edema  Neuro: A&O  Labs   CBC:    Latest Ref Rng & Units 04/15/2023   10:03 PM 04/08/2023   10:40 AM 11/07/2022   12:32 PM  CBC  WBC 4.0 - 10.5 K/uL 5.4  3.4  7.3   Hemoglobin 13.0 - 17.0 g/dL 78.2  95.6  21.3   Hematocrit 39.0 - 52.0 % 40.8  40.0  40.3   Platelets 150 - 400 K/uL 311  258  387      Basic Metabolic Panel:    Latest Ref Rng & Units 04/15/2023   10:03 PM 04/08/2023   10:40 AM 03/10/2023   11:18 AM  BMP  Glucose 70 - 99 mg/dL 086  578  469   BUN 8 - 23 mg/dL 31  38  43   Creatinine 0.61 - 1.24 mg/dL 6.29  5.28  4.13   BUN/Creat Ratio 10 - 24   27   Sodium 135 - 145 mmol/L 140  140  139   Potassium 3.5 -  5.1 mmol/L 4.1  4.0  4.9   Chloride 98 - 111 mmol/L 101  100  97   CO2 22 - 32 mmol/L 29  31  31    Calcium 8.9 - 10.3 mg/dL 9.7  13.0  86.5     GFR: Estimated Creatinine Clearance: 66.4 mL/min (by C-G formula based on SCr of 1.14 mg/dL). Recent Labs  Lab 04/15/23 2203  WBC 5.4    Liver Function Tests: Recent Labs  Lab 04/15/23 2203  AST 26  ALT 23  ALKPHOS 54  BILITOT 0.5  PROT 7.1  ALBUMIN 3.9   No results for input(s): "LIPASE", "AMYLASE" in the last 168 hours. No results for input(s): "AMMONIA" in the last 168 hours.  ABG    Component Value Date/Time   TCO2 26 05/02/2022 0907     Coagulation Profile: No results for input(s): "INR", "PROTIME" in the last 168 hours.  Cardiac Enzymes: No results for  input(s): "CKTOTAL", "CKMB", "CKMBINDEX", "TROPONINI" in the last 168 hours.  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 Seen in ED by ENT who recommended ICU admission for observation, steroids and racemic epi. ENT following: plan for d/c today on steroid taper and PPI if doing well this afternoon and out pt ENT f/u in 1 week Continue Augmentin for prevention of post-op infection Continue decadron 8 mg IV q6h Racemic Epi neb PRN NPO   Dysphagia and aspiration (TF through PEG) NPO for now, s/p IVF   Right sided eustachian tube dysfunction and chronic middle ear effusion, s/p myringotomy and ventilating T-tube placement     HTN and orthostatic hypotension -CTM Failed Florinef due to elevated BP Amlodipine was d/c due to hypotension   Hypothyroidism -Resume home synthroid   Dyslipidemia -Hold med for now   GERD -Resume H2-blocker  Best Practice (right click and "Reselect all SmartList Selections" daily)   Diet/type: NPO DVT prophylaxis: Lovenox Pressure ulcer(s) None identified  GI prophylaxis: H2 Lines: N/A Foley:  no Code Status:  full code Last date of multidisciplinary goals of care discussion [12/5]    Erick Alley, DO Family Medicine, PGY-3 04/16/2023 8:05 AM

## 2023-04-16 NOTE — Progress Notes (Signed)
eLink Physician-Brief Progress Note Patient Name: Todd [name] DOB: May 27, 1960 MRN: [id number]   Date of Service  04/16/2023  HPI/Events of Note  62 year old male with a history of gastroesophageal reflux disease, left-sided vocal cord paralysis, glottic insufficiency in the setting of previous tonsillar cancer status post radiotherapy and chemotherapy and chronic eustachian tube dysfunction who is admitted to the ICU after he presented to the emergency department with stridor after recent direct microlaryngoscopy with rigid bronchoscopy and vocal fold injections.  On presentation, he is hypertensive saturating 98.  Results with normal BMP and CBC.  Chest radiograph unremarkable.  eICU Interventions  Maintain scheduled IV steroids  Consider resuming postoperative Augmentin for eustachian tube dysfunction status post T-tube  Meds and feeds enteral through peg   Racemic epinephrine as needed for stridor.  No indication for intubation at this time  DVT prophylaxis with enoxaparin GI prophylaxis with home famotidine     Intervention Category Evaluation Type: New Patient Evaluation  [name] 04/16/2023, 12:35 AM

## 2023-04-17 ENCOUNTER — Ambulatory Visit: Payer: BC Managed Care – PPO | Admitting: Cardiology

## 2023-04-17 DIAGNOSIS — R0603 Acute respiratory distress: Secondary | ICD-10-CM

## 2023-04-20 ENCOUNTER — Ambulatory Visit: Payer: BC Managed Care – PPO | Attending: Radiation Oncology

## 2023-04-20 DIAGNOSIS — R1312 Dysphagia, oropharyngeal phase: Secondary | ICD-10-CM | POA: Diagnosis not present

## 2023-04-20 DIAGNOSIS — R471 Dysarthria and anarthria: Secondary | ICD-10-CM | POA: Diagnosis not present

## 2023-04-20 NOTE — Therapy (Signed)
OUTPATIENT SPEECH LANGUAGE PATHOLOGY SWALLOW TREATMENT   Patient Name: Todd [name] MRN: [id number] DOB:1960-12-27, 62 y.o., male Today's Date: 04/20/2023  PCP: Hiram Comber, MD REFERRING PROVIDER: Lonie Peak, MD  END OF SESSION:  End of Session - 04/20/23 1450     Visit Number 13    Number of Visits 17    Date for SLP Re-Evaluation 05/21/23    SLP Start Time 1449    SLP Stop Time  1531    SLP Time Calculation (min) 42 min    Activity Tolerance Patient tolerated treatment well                     Past Medical History:  Diagnosis Date   Acquired hypothyroidism    secondary to radiation //   followed by pcp   Arthritis    shoulders, neck, knees, feet   Bilateral sensorineural hearing loss    due to radiation   Decreased range of motion of neck    due to fibrotic changes of 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   Denervation atrophy of muscle    tongue ,  chronic ,  due to radiation injury   (neurologist-- dr aKate Sable ([facility])  EMG in care everywhere done 03-11-2022   Frequency of urination    GERD (gastroesophageal reflux disease)    History of cancer chemotherapy 2017   tonsil cancer   03-24-2016  to 05-06-2016   History of external beam radiation therapy    03-24-2016  to 05-15-2016  Left Tonsil and Bilateral Neck  @ [facility]   HLD (hyperlipidemia)    Hoarseness, chronic    due to radiation   Hyperlipidemia, mixed    Hypertension    pt states he has not had HTN since June 2024 (he has not taken BP meds since then since his BP has been on the lower side)   Inguinal hernia, left    Lower facial weakness    intermittant left lower facial weakness   Numbness and tingling in left arm    intermittently per pt   Oropharyngeal dysphagia    hx neck radiation for left tonsil cancer ;  Speech therapy, regular diet with precautions swallows okay   Orthostatic hypotension    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   followed by ENT dr bates/   oncologist--- dr Bertis Ruddy;  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;  completed chemo 05-06-2016 and completed radiation of left tonsil/ bilateral neck 05-15-2016  w/ radiation injury to tongue (denervation chronic)   Wears glasses    Wears hearing aid in both ears    Past Surgical History:  Procedure Laterality Date   INGUINAL HERNIA REPAIR Left 05/02/2022   Procedure: OPEN LEFT INGUINAL HERNIA REPAIR WITH MESH;  Surgeon: Berna Bue, MD;  Location: [facility];  Service: General;  Laterality: Left;   IR GASTROSTOMY TUBE MOD SED  11/07/2022   IR GASTROSTOMY TUBE REMOVAL  01/30/2017   IR GENERIC HISTORICAL  03/20/2016   IR US GUIDE VASC ACCESS RIGHT 03/20/2016 Simonne Come, MD [facility]   IR GENERIC HISTORICAL  03/20/2016   IR FLUORO GUIDE PORT INSERTION RIGHT 03/20/2016 Simonne Come, MD [facility]   IR GENERIC HISTORICAL  03/20/2016   IR GASTROSTOMY TUBE MOD SED 03/20/2016 Simonne Come, MD [facility]   IR REMOVAL TUN ACCESS W/ PORT W/O FL MOD SED  01/30/2017   LARYNGOSCOPY WITH BRONCHOSCOPY AND BALLOON DILATION N/A 04/14/2023   Procedure: LARYNGOSCOPY  WITH BRONCHOSCOPY AND BALLOON DILATION;  Surgeon: Ashok Croon, MD;  Location: [facility];  Service: ENT;  Laterality: N/A;   MICROLARYNGOSCOPY W/VOCAL CORD INJECTION Bilateral 04/14/2023   Procedure: MICROLARYNGOSCOPY WITH VOCAL CORD INJECTION;  Surgeon: Ashok Croon, MD;  Location: [facility];  Service: ENT;  Laterality: Bilateral;   MYRINGOTOMY WITH TUBE PLACEMENT Right 04/14/2023   Procedure: MYRINGOTOMY WITH TUBE PLACEMENT;  Surgeon: Ashok Croon, MD;  Location: [facility];  Service: ENT;  Laterality: Right;   RIGID BRONCHOSCOPY N/A 04/14/2023   Procedure: RIGID BRONCHOSCOPY;  Surgeon: Ashok Croon, MD;  Location: [facility];  Service: ENT;  Laterality: N/A;   Patient Active Problem List   Diagnosis Date Noted   Acute respiratory distress 04/17/2023   Stridor 04/16/2023   Vocal fold paralysis, left 04/16/2023   Vocal cord  edema 04/15/2023   Aspiration into airway 04/14/2023   Age-related vocal fold atrophy 04/14/2023   Paralysis of left vocal fold 04/14/2023   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   Malignant neoplasm of tonsillar fossa (HCC) 01/07/2023   Pulmonary infiltrates 09/08/2022   Hypothyroidism    Right hip pain 12/06/2018   CKD (chronic kidney disease), stage III (HCC) 11/30/2017   Anemia, chronic disease 11/30/2017   Multiple lung nodules on CT 09/08/2016   Pharyngoesophageal dysphagia 06/13/2016   Pancytopenia, acquired (HCC) 05/26/2016   Deficiency anemia 05/26/2016   Splenic lesion 05/16/2016   Jaw pain 04/08/2016   Essential hypertension 03/18/2016   Weight loss 02/26/2016   Tonsil cancer (HCC) 02/21/2016     ONSET DATE: 2018   REFERRING DIAG: C09.9 (ICD-10-CM) - Tonsil cancer   THERAPY DIAG:  Dysphagia, oropharyngeal phase  Dysarthria and anarthria  Rationale for Evaluation and Treatment: Rehabilitation  SUBJECTIVE:   SUBJECTIVE STATEMENT: Pt has not drank anything since prior to 04/14/23 - sx for balloon dilation, vocal fold injection, and tubes placed.  Two days following sx, pt was admitted to ED for 24 hour observation.  Pt accompanied by: significant other  PERTINENT HISTORY: From Dr. Basilio Cairo note May 2024: The patient returns today for follow-up after he approached me with new symptoms at our head and neck annual survivorship celebration.  [name] presents for follow up of radiation completed 05/15/16 to his Tonsillar fossa/oropharynx.  Recent PET and MRI below were reviewed at tumor board, NED.  See below. 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  He is also losing weight.  PAIN:  Are you having pain? Yes: NPRS scale: 3/10 Pain location: cervical neck down to mid-back Pain description: annoying pain, makes it hard to sleep Aggravating factors: certain movement Relieving factors: meds  FALLS: Has patient fallen in last 6 months?  No  PATIENT GOALS: Swallow safely  OBJECTIVE: SOLDATOVA 04/16/23: Procedure: Flexible fiberoptic laryngoscopy Surgeon: Ashok Croon, MD Anesthesia: Topical lidocaine and Afrin Complications: None Condition is stable throughout exam   Indications and consent:  The patient presents to the clinic with Indirect laryngoscopy view was incomplete. Thus it was recommended that they undergo a flexible fiberoptic laryngoscopy. All of the risks, benefits, and potential complications were reviewed with the patient preoperatively and verbal informed consent was obtained.  Procedure: The patient was  seated upright in the clinic. Topical lidocaine and Afrin were applied to the nasal cavity. After adequate anesthesia had occurred, I then proceeded to pass the flexible telescope into the nasal cavity. The nasal cavity was patent without rhinorrhea or polyp. The nasopharynx was also patent without mass or lesion. The base of tongue was visualized and was normal. There were no signs of pooling of secretions in the piriform sinuses. The true vocal folds had evidence of previously seen L VF paralysis and evidence of injection augmentation, with mild edema. Subglottis was patent. There were no signs of glottic or supraglottic mucosal lesion or mass. There was moderate interarytenoid pachydermia and post cricoid edema. The telescope was then slowly withdrawn and the patient tolerated the procedure throughout.  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  Had total of 4 aspiration pneumonia episodes and was seen by pulmonary with recommendation to follow with SLP, and limit antibiotic use.  Repeat MBS in June 2024 with evidence of pharyngeal dysphagia and aspiration.  Underwent PEG placement, and working with speech, liquid only intake by mouth and boost supplementation via PEG.  He reports significant decline in speech and swallowing about a year ago, and prior to that had normal speech and was tolerating regular diet without issues.  On my exam today I was able to do evaluation of UES/upper esophagus and post-cricoid area in the office advancing flexible scope passed postcricoid area- no stricture seen.  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  There was significant pooling of secretions along the vallecula bilaterally.  There was evidence of left cranial nerve XII deficit, and diffuse tongue fasciculations.  I discussed exam findings with the patient.  My main concern is due to acute worsening of both speech/tongue mobility and evidence of diffuse tongue fasciculations, neurologic condition such as ALS needs to be excluded.  Other neurologic processes such as those affecting bulbar function also have to be ruled out.  Patient's severe pharyngeal dysphagia could still be related to sequela of radiation, but acute changes in his swallowing function are certainly suspicious for another cause.  I advised the patient to see his neurologist Dr Kate Sable at [facility] in October as scheduled and will communicate with his neurologist to ensure above concerns are addressed during his follow-up. Will consider procedural interventions in the future based on neurology evaluation and for further workup.   Specifically will consider management of VPI and glottic insufficiency Patient will follow-up with his SLP and continue swallow therapy and aspiration precautions including good oral hygiene and strategies to minimize aspiration.  He will return after follow-up with his neurologist.  Will do videostrobe when he returns to determine if left vocal fold paralysis results in severe glottic insufficiency.  Update 03/16/23 He drinks milk shakes for pleasure, but is still PEG dependent. He is not sure if swallow therapy is helping.  He had multiple aspiration PNA's in the past, last one Feb 2024 No fever or dyspnea at home. Post-XRT sequelae, L VF paralysis chronic dysphonia and dysphagia PEG dependence 2/2 aspiration PNA - videostrobe today with incomplete glottic closure and evidence of thick yellow secretions in hypo/oropharynx  and along naso pharyngeal wall - Augmentin suspension BID x 2 weeks and steroid pack (Medrol dose pack) 2. Chronic nasal congestion and right sided middle ear effusion - Augmentin/Medrol pack as above  - nasal saline rinses with NeilMed - Flonase 2 puffs b/l nares BID 3. Chronic dysphagia and aspiration PEG/Dependent  -We discussed at length that a lot of his swallowing problems are due to postradiation side effects.  We also discussed that there could be a stricture above the level of UES and those are common in head neck cancer survivors.  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 -We also discussed the need to get neurology  evaluation to ensure he does not have any underlying neurologic conditions such as ALS due to history of normal swallowing after radiation and acute onset trouble with swallowing approximately a year ago -Will schedule for DML rigid bronchoscopy bilateral vocal fold injection augmentation, possible posterior pharyngeal wall soft palate injection augmentation, EGD and balloon dilation 4.  Eustachian tube dysfunction right side, recurrent issue per patient.  He reports poor hearing on the left side, and right side is his better ear -Will order audiogram to evaluate hearing and middle ear function -If evidence of fluid on tympanograms will consider tube placement on the right side at the time of his procedure 5. Chronic GERD LPR - continue Pepcid    RESULTS FROM OBJECTIVE SWALLOSW STUDY (MBS)-  10/30/22:  Clinical Impression: Patient presents with mild oral and severe pharyngo-cervical esophageal dysphagia presumed due to iatrogenic effects of XRT that is significantly worse compared to prior MBS conducted by this SLP in 2018. SLP reviewed note from [facility] in 2023/11 but could not view images. 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 Head tilt to the right attempted to aid oral transiting/control with partial effectiveness. Pharyngeal swallow marked by sevfere hypomotility resulting in minimal transiting of barium into esophagus and laryngeal penetration/aspiration. Mulitple dry swallows *pt initiated* is helpful to decrease amount of retention but does not fuly clear it, even after 5-6 swallows with small single liquid bolus. Head of chair reclined to approx 45* with slight neck extension did decrease amount of penetration and aspiration as gravity faciliated retention of barium in posterior pharynx. Even in posterior position and with use of right tongue for oral transiting, pt penetrated very small bite of masticated  cracker after the swallow as pharyngeal retention spilled into open airway. Cued cough effectively cleared cracker into pharynx for pt to swallow with liquids. Clearance into cervical esophagus was significantly impaired with gross retention of liquids as pyriform sinus. Question if he may benefit from intervention for PES opening to mitigate his dysphagia - advised he speak to Dr Jenne Pane, ENT, re: potential.   Unfortunately [facility] current dysphagia is profound and given progressive deficit (presumed from XRT effects, fibrosis) for 15 months, prognosis for swallow to return to functional level is guarded at best.    Advised pt consider ordering antichoking device for emergent use.  Recommended diet:PO diet PO Diet Recommendation: Thin liquids (  Level 0); Mildly thick liquids (Level 2, nectar thick); Full liquid diet, some soft solids Liquid Administration via: Cup; Straw; Spoon Medication Administration: Other (Comment) (consider suspension). Supervision: Patient able to self-feed Recommended compensations:  Swallowing strategies  : effortful swallow; Multiple dry swallows after each bite/sip; Hard cough after swallowing; Head tilt right during swallowing; Slow rate; Small bites/sips (Recline approx 45* with slight chin elevation), Follow solids with liquids Postural changes: Partially reclined for meals Oral care recommendations: Oral care QID (4x/day) Recommended consults: Consider ENT consultation (? ENT to determine if potential PES treatment may be helpful)   PATIENT REPORTED OUTCOME MEASURES (PROM): EAT-10: Pt returned 12/05/22 and scored 36/40 (lower scores = better QOL)   TODAY'S TREATMENT:                                                                                                                                         DATE:  04/20/23: Pt is s/p vocal fold injection sx. 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 SLP assessed  pt's POs with water today. Oral mech unchanged except voice improved. Today pt was provided 1/4-1/2 teaspoons H2O after stating he had completed oral care in the last 30 minutes. Pt with consistent throat clearing using head/torso lean to rt for boluses x4. Throat clearing subsided after 4 1/2 minutes. SLP consulted Dr. Dr. Irene Pap and she thought pt would be cleared to have this size bolus for practice (to minimize disuse atrophy) at home until FEES 05/11/23. SLP instructed pt to complete oral POs with 1/4-1/2 teaspoon water boluses following thorough oral care, x2-3/day, and cease if overt s/sx aspiration PNA. He should also cont dysphagia exercises until next visit.   03/23/23: Noted Dr. Irene Pap appt 03/16/23 and results above. Pt is no longer having POs other than one bottle of water/day, since 03/16/23, due to being extra careful about surgery date. SLP told pt he can do this but should cont with the HEP for swallowing once/day. HEP completed with independence. Pt with consistent throat clear with liquids however is doing this for safety. Speech tx: Pt should cont HEP for speech and if he notes his speech beginning to be more dysarthric during HEP, he should stop and wait 30 minutes to complete the HEP. Today he completed the HEP with specific difficulty articulating velar (k,g) and liquids (for example, s, sh, z). Pt with EGD balloon dilation and vocal fold injection sx 04/14/23 so next visit after that on 04/20/23.   02/20/23:  Swallow: Next session, pt can reduce HEP to x2/week. Pt currently having thin to nectar liquids. No overt s/sx aspiration PNA today. Pt with f/u with neurology (Dr. Lonna Cobb - [facility]) 03/12/23. He was independent with HEP. Is going to protrude tongue more with Masako from now on. With POs - pt had drinkable yogurt today with occasional throat clearing,  pt stated due mostly to feeling of residue. SLP postulates pt with more frequent s/sx velopharyngeal incompetence during  swallowing. Pt admitted to taking larger sips. SLP encouraged smaller sips.  Pt states his speech is more slurred after talking. SLP questions some neurological basis worsening pt's overall swallowing and speech ability. Follow up with neurologist 03/12/23. Pt and SLP agree pt could return in 4 weeks instead of 2. Speech: SLP discussed and educated pt and wife re: speech compensations, and provided a handout of articulatory HEP and reviewed with pt and wife. Pt to begin this asap for speech compensations.   01/23/23: Pt saw Dr. Irene Pap 01/19/23: Assessment/plan above under "Objective findings". SLP discussed that pt should perform HEP until mid-October. No plan to have follow up MBS at this time due to pt performance in ST sessions relatively unchanged in last 2-3 sessions. With POs (wet puree and water), wet voice intermittently, pt using safe swallow compensations with success. No overt s/sx aspiration PNA at this time. SLP told pt to cont with current solid consistency - said could have puree soups (homogenous consistency) and broths - teaspoons.  Pt completed HEP with independence. Pt agrees with SLP his condition is currently stable and will cont to be seen every other week for two months. SLP questions if pt's swallowing will improve with improved velopharyngeal closure, and the role possible glottic insufficiency may be playing in efficacy/efficiency of pt's swallow.  01/16/23: Pt to see Dr. Irene Pap 01/19/23. Pt performed oral care prior to ST today. SLP plan is to have pt perform dysphagia HEP for approx 10 weeks prior to [facility] (which will be end of September). Pt appears to cont to gain swallow strength week after week. With POs, >75% of the time Faizan had audible swallow, possibly insufficient velopharyngeal closure, as pt again (as in previous session) sniffed after swallowing water. To this point, pt has remained free of overt s/sx aspiration PNA and has followed safe swallow strategies in sessions,  sometimes with rare initial reminder to cough/clear throat after 2nd or 3rd swallow with each bolus. Wet voice heard approx 50% of the time with yogurt and water, mostly cleared with multiple swallows. 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  Pt was independent with HEP.   01/08/23: SLP  educated pt/wife rationale for waiting to schedule MBS/FEES until mid-September or later will give Drelon ample time for strength gain in swallowing musculature (at least 8 weeks). No overt s/sx aspiration PNA this session; pt ate small spoons of baby food yams, and drank water. SLP had to provide initial cue to cough after a bite of yams (pt was inconsistent) but then [place] cont'd with consistent coughing after last swallow of bolus. PT to cont with solid POs and liquid POs at home. Appointment with Dr. Irene Pap is 01/19/23. Min A with HEP for tongue protrusion on Masako. Will keep pt once/week to monitor for s/sx PNA and to possibly upgrade pt to regular pureed (instead of baby food) after assessment in-session.  01/01/23: Pt reports difficult to get trigger on first 1-3 reps of Masako and supraglottic but once he passes these number of reps - no difficulty with triggering swallow. No problem at all triggering effortful swallow from rep #1 on. Today pt did not have difficulty with triggering swallows with Masako. Although lingual protrusion was adequate, SLP told pt to protrude tongue a bit further for the first 2 reps and he could do so. SLP  told pt protruding tongue to that length was preferable to the length he currently uses, so he should try to have more reps with further protrusion. Last session, SLP added dynamic Shaker to pt's regimen, 30 1-second reps at least BID. With POs, pt made through one 4 oz jar in one week. SLP suggested pt use drinkable yogurt, Fair life milk, or Boost as POs as well, as long as pt is using safe swallow techniques from [facility] 10/30/22. Pt req'd  cues for a cough after every bite/sip, and SLP provided rationale and reminder that pt's sensation was not  100% when material was penetrated.  12/26/22: No s/sx any aspiration PNA today, nor in this last week at all. Pt cont to perform PO trials with water at home, but coughs with every 3-4th swallow. SLP strongly urged pt to complete cough every swallow based upon MBS results. Pt followed other precautions with independence. SLP trialed baby food applesauce with pt with liquid wash and completed with success. SLP told pt to try 10 boluses as many times/day as desired but always stopping if he feels he has less than 8/10 stamina (10=WNL), and always perform thorough oral care before and after POs. SLP also suggested pt could try baby food applesauce-consistency pudding or yogurt. Pt completed HEP today independently. Due to initiating puree today pt will keep frequency at once/week.   12/19/22: No s/sx any aspiration PNA today, nor in this last week at all. Performed oral care just prior to appointment today. Pt independent with HEP today. Protruded tongue past lips independently. Pt cont to take one bottle of water per day, multiple (x2-3) sips per bolus with multiple hard swallows and cough if necessary. Instead, SLP told pt to take one sip with two-three hard swallows, cough, reswallow hard. SLP to attempt smooth purees with pt next session -will have been 4 weeks since initiation of HEP and pt without any overt s/sx aspiration PNA at this time.  12/12/22: Pt performed oral care prior to ST. Long discussion about whether or not BaSwallow exam would be safe for pt at this time. Afterwards, SLP emailed SLP who performed MBS with pt and she stated BaSwallow exam might be more difficult to perform safely at this time. Pt performed HEP independently and used safe swallow techniques with sips of water.   12/05/22: Pt independent with HEP today, will use mirror for Masako until extent of tongue protrusion  habituated. Pt demo'd strategies with sip water today, independently. Pt and SLP agree pt can reduce to once/week. SLP provided s/sx aspiration PNA and pt with mod I.  12/03/22: Pt has been performing HEP regularly. Dora Sims he performed exercises with initial mod cue for Masako (tongue protrusion).  He drank water without overt s/sx aspiration x3 using safe swallow strategies independently.  11/24/22:  Today SLP explained results of pt's MBS and the rationale for the safe swallow strategies. Pt demonstrated understanding of each and reports he has been using these with one bottle of water each day. No overt s/sx of aspiration PNA to date with >1 week of PO water. Pt has NOT been performing oral care prior to POs. SLP educated pt re: swallow HEP procedure. Pt performed each exercise and SLP ensured correctness prior to moving to next exercise. Pt req'd min A with strength of cough with supraglottic.  PATIENT EDUCATION: Education details: see "today's treatment" Person educated: Patient and Spouse Education method: Explanation, Demonstration, Verbal cues, and Handouts Education comprehension: verbalized understanding, returned demonstration, verbal cues required,  and needs further education   ASSESSMENT:  CLINICAL IMPRESSION: Patient is a 62 y.o. M who was seen today for treatment of swallowing and speech. Pt is now s/p balloon dilation, vocal fold injection, and ear tube placement. Please see "today's treatment" for more details. He cont to not desire formal ST for speech intelligibility at this time. It should be noted that pt's prognosis after June 2024 MBS, improvement for a functional swallow was GUARDED.   OBJECTIVE IMPAIRMENTS: include dysarthria and dysphagia. These impairments are limiting patient from ADLs/IADLs, effectively communicating at home and in community, and safety when swallowing. Factors affecting potential to achieve goals and functional outcome are co-morbidities and severity  of impairments. Patient will benefit from skilled SLP services to address above impairments and improve overall function.  REHAB POTENTIAL: Fair given prognosis from MBS   GOALS: Goals reviewed with patient? Yes  SHORT TERM GOALS: Target date: 12/25/22  Pt will perform HEP with rare min A in two sessions Baseline:12/03/22 Goal status: Met  2.  Pt will follow prescribed safe swallow strategies in 3 sessions Baseline: 12/03/22, 12/12/22 Goal status: Partially met  3.  Pt will perform HEP 6/7 days a week for two weeks, per HEP monitoring method Baseline:  Goal status: MET  4.  Pt and/or wife will ID 3 overt s/sx aspiration PNA with modified independence Baseline:  Goal status: met   LONG TERM GOALS: Target date: 01/30/23; 05/21/23  Pt will perform HEP with rare min A in two sessions Baseline: 12/19/22 Goal status: Met  2.  Pt will follow prescribed safe swallow strategies in 3 sessions, after 12/25/22 Baseline: 01/08/23, 01/16/23 Goal status: met  3.  Pt will undergo follow up MBS when/if clinically indicated Baseline:  Goal status: DEFERRED  4.  Pt will score higher on PROM than initial score Baseline:  Goal status: DEFERRED, and ongoing  5.   Pt will perform HEP independently in three sessions Baseline: 01/16/23, 01/23/23 Goal status: Met  6.   Pt will remain free of overt s/sx aspiration PNA over three sessions Baseline: 02/20/23, 04/20/23 Goal Status: Partially met, and ongoing    PLAN:  SLP FREQUENCY: 1x/month  SLP DURATION: other: 90 days  PLANNED INTERVENTIONS: Aspiration precaution training, Pharyngeal strengthening exercises, Diet toleration management , Environmental controls, Trials of upgraded texture/liquids, Cueing hierachy, Internal/external aids, SLP instruction and feedback, Compensatory strategies, Patient/family education, and Repeat MBS PRN    Tashiba Timoney, CCC-SLP 04/20/2023, 10:27 PM

## 2023-04-22 DIAGNOSIS — E46 Unspecified protein-calorie malnutrition: Secondary | ICD-10-CM | POA: Diagnosis not present

## 2023-04-22 DIAGNOSIS — E039 Hypothyroidism, unspecified: Secondary | ICD-10-CM | POA: Diagnosis not present

## 2023-04-22 DIAGNOSIS — R1312 Dysphagia, oropharyngeal phase: Secondary | ICD-10-CM | POA: Diagnosis not present

## 2023-04-22 DIAGNOSIS — R7301 Impaired fasting glucose: Secondary | ICD-10-CM | POA: Diagnosis not present

## 2023-04-22 DIAGNOSIS — I129 Hypertensive chronic kidney disease with stage 1 through stage 4 chronic kidney disease, or unspecified chronic kidney disease: Secondary | ICD-10-CM | POA: Diagnosis not present

## 2023-04-23 ENCOUNTER — Ambulatory Visit (INDEPENDENT_AMBULATORY_CARE_PROVIDER_SITE_OTHER): Payer: BC Managed Care – PPO | Admitting: Otolaryngology

## 2023-04-23 ENCOUNTER — Telehealth: Payer: Self-pay | Admitting: Radiology

## 2023-04-23 ENCOUNTER — Encounter (INDEPENDENT_AMBULATORY_CARE_PROVIDER_SITE_OTHER): Payer: Self-pay | Admitting: Otolaryngology

## 2023-04-23 VITALS — BP 97/62 | HR 73

## 2023-04-23 DIAGNOSIS — C090 Malignant neoplasm of tonsillar fossa: Secondary | ICD-10-CM

## 2023-04-23 DIAGNOSIS — H6991 Unspecified Eustachian tube disorder, right ear: Secondary | ICD-10-CM | POA: Diagnosis not present

## 2023-04-23 DIAGNOSIS — Z923 Personal history of irradiation: Secondary | ICD-10-CM

## 2023-04-23 DIAGNOSIS — R1310 Dysphagia, unspecified: Secondary | ICD-10-CM

## 2023-04-23 DIAGNOSIS — K219 Gastro-esophageal reflux disease without esophagitis: Secondary | ICD-10-CM | POA: Diagnosis not present

## 2023-04-23 DIAGNOSIS — Z85819 Personal history of malignant neoplasm of unspecified site of lip, oral cavity, and pharynx: Secondary | ICD-10-CM

## 2023-04-23 DIAGNOSIS — Z85818 Personal history of malignant neoplasm of other sites of lip, oral cavity, and pharynx: Secondary | ICD-10-CM | POA: Diagnosis not present

## 2023-04-23 DIAGNOSIS — R0981 Nasal congestion: Secondary | ICD-10-CM | POA: Diagnosis not present

## 2023-04-23 DIAGNOSIS — K1379 Other lesions of oral mucosa: Secondary | ICD-10-CM

## 2023-04-23 DIAGNOSIS — R634 Abnormal weight loss: Secondary | ICD-10-CM | POA: Diagnosis not present

## 2023-04-23 DIAGNOSIS — R1312 Dysphagia, oropharyngeal phase: Secondary | ICD-10-CM

## 2023-04-23 DIAGNOSIS — K148 Other diseases of tongue: Secondary | ICD-10-CM

## 2023-04-23 DIAGNOSIS — T17908A Unspecified foreign body in respiratory tract, part unspecified causing other injury, initial encounter: Secondary | ICD-10-CM

## 2023-04-23 DIAGNOSIS — H90A31 Mixed conductive and sensorineural hearing loss, unilateral, right ear with restricted hearing on the contralateral side: Secondary | ICD-10-CM

## 2023-04-23 DIAGNOSIS — Y842 Radiological procedure and radiotherapy as the cause of abnormal reaction of the patient, or of later complication, without mention of misadventure at the time of the procedure: Secondary | ICD-10-CM | POA: Diagnosis not present

## 2023-04-23 DIAGNOSIS — J3801 Paralysis of vocal cords and larynx, unilateral: Secondary | ICD-10-CM

## 2023-04-23 NOTE — Patient Instructions (Signed)
[facility] Med Nasal Saline Rinse   - start nasal saline rinses with [employer] Bottle available over the counter or online to help with nasal congestion

## 2023-04-23 NOTE — Progress Notes (Signed)
ENT Progress Note:  Discussed the use of AI scribe software for clinical note transcription with the patient, who gave verbal consent to proceed.  History of Present Illness   Here for f/u after DML b/l injection augmentation EGD and balloon dilation to 21 mm at the UES/PES level and right ear tube placement. Developed stridor and throat tightness POD#2 came in to ED, all sx resolved after steroids IV in ED. He reports significant improvement in hearing and reduction in ear pressure following the placement of a tube in the right ear. However, he now experiences similar symptoms in the left ear, and would like a tube on the left side as well.  The patient has been managing his symptoms with a feeding tube due to difficulties with swallowing. Despite the absence of strictures or esophageal issues on EGD, the patient has been unable to consume food orally. The primary concern appears to be related to the base of the tongue and throat based on his swallow study and additional workup.   In addition to swallowing difficulties, the patient reports persistent nasal drainage. Despite the use of Flonase, the drainage continues, possibly due to incomplete palate closure (has hx of VPI)  The patient also has a history of acid reflux, managed with Protonix and Famotidine.    Update 03/16/23: His Neurology f/u is scheduled and currently pending. He drinks shakes for pleasure, but still relies on PEG for nutrition and takes medications via PEG. He has had sensation of decreased hearing on the right side and some ear discomfort. No fevers, no admissions to the hospital.    Initial Evaluation 01/19/23 Reason for Consult: dysphagia hx of oropharyngeal cancer and cXRT  HPI: Todd [name] is an 62 y.o. male with hx of oropharyngeal SCCa stage IVB (T3N3M0) diagnosed 2017, s/p primary cXRT who is here for evaluation of severe dysphagia.  He was referred by Dr. Basilio Cairo and his current SLP, due to significant decline in  swallowing function approximately 1 year ago and the need to rely on PEG for nutrition support secondary to four episodes of aspiration pneumonia.  He was previously diagnosed and followed by Dr. Jenne Pane ENT, who diagnosed his left oropharyngeal cancer, tonsil being primary site. He reports that approximately a year ago he started to have significant issues with his speech and swallowing.  Following primary chemoradiation for oropharyngeal cancer he was able to eat everything without problems, and his speech was normal.  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 Last spring approximately in April and May of 2024, he began to develop chills and fevers at home.  Oropharyngeal cancer recurrence was considered and he had PET/CT, neck MRI and chest CT. there was no evidence of disease recurrence however imaging demonstrated bilateral pneumonia and borderline enlarged mediastinal nodes.  Since then he had 4 episodes of pneumonia which was thought to be due to aspiration.  He was seen by pulmonary Dr. Celine Mans in May 2024.  Record review indicates that Dr. Celine Mans advised him to see his speech therapist, and stated in his note from May of 17th at the most likely cause of bilateral pneumonia is aspiration.  Plan comes to his swallowing, initially he noticed that he began to choke on both liquids and solids approximately a year ago.  He would have mucus and productive cough. Denies dyspnea with PNA and would get chills and fevers. Then he would develop productive cough.  After repeat modified barium  swallow in June, due to concern for recurrent aspiration pneumonia and significant weight loss, he underwent PEG placement.  He is consuming liquids such as water coffee and orange juice by mouth but supplements calories with boost through the PEG.  He also has a history of cervical myelopathy with bilateral numbness and tingling in his hands.  Had 2 neurosurgical  consultations, locally and at [facility], and due to history of radiation was advised not to undergo procedure because the risks would be significant if ACDF was attempted.  Seeing neurology currently, although unclear what was the initial consult reason. Being f/b Neurology Dr Noreene Filbert, at [facility], and has a scheduled appt 03/02/23     Records Reviewed: Multiple office notes reviewed most pertinent summarized below and in HPI MBS 10/30/2022 Clinical Impression: Clinical Impression: Patient presents with mild oral and severe pharyngo-cervical esophageal dysphagia presumed due to iatrogenic effects of XRT that is significantly worse compared to prior MBS conducted by this SLP in 2018.   SLP reviewed note from [facility] at [facility] in 2023/11 but could not view images. 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  Head tilt to the right attempted to aid oral transiting/control with partial effectiveness.  Pharyngeal swallow marked by severe hypomotility resulting in minimal transiting of barium into esophagus and laryngeal penetration/aspiration. Mulitple dry swallows *pt initiated* is helpful to decrease amount of retention but does not fuly clear it, even after 5-6 swallows with small single liquid bolus.  Head of chair reclined to approx 45* with slight neck extension did decrease amount of penetration and aspiration as gravity faciliated retention of barium in posterior pharynx.  Even in posterior position and with use of right tongue for oral transiting, pt penetrated very small bite of masticated cracker after the swallow as pharyngeal retention spilled into open airway.  Cued cough effectively cleared cracker into pharynx for pt to swallow with liquids.  Clearance into cervical esophagus was significantly impaired with gross retention of liquids as pyriform sinus.         At this time, pt is very high  aspiration and malnutrition, dehydration risk with his current level of dysphagia.  Note plan is for PEG next week *11/07/2022  per pt and SLP agrees this is warranted.  SLP reviewed MBS extensively with pt and reinforced most effective compensation strategies.     Encouraged pt to continue po with strict adherence to compensations including head of bed/chair reclined, chin elevated, multiple swallows per bolus, cough/throat clear and expectorate prn.      Question if he may benefit from intervention for PES opening to mitigate his dysphagia - advised he speak to Dr Jenne Pane, ENT, re: potential.     Unfortunately [facility] current dysphagia is profound and given progressive deficit (presumed from XRT effects, fibrosis) for 15 months, prognosis for swallow to return to functional level is guarded at best.    Advised pt consider ordering antichoking device for emergent use.  Thanks so much for this consult.   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 Kate Sable ([facility] [place])  EMG in care everywhere done 03-11-2022   Frequency of urination    GERD (gastroesophageal reflux disease)    History of cancer chemotherapy 2017   tonsil cancer   03-24-2016  to 05-06-2016   History of external beam radiation therapy    03-24-2016  to 05-15-2016  Left Tonsil and Bilateral Neck  @ [facility]   HLD (hyperlipidemia)     Hoarseness, chronic    due to radiation   Hyperlipidemia, mixed    Hypertension    pt states he has not had HTN since June 2024 (he has not taken BP meds since then since his BP has been on the lower side)   Inguinal hernia, left    Lower facial weakness    intermittant left lower facial weakness   Numbness and tingling in left arm    intermittently per pt   Oropharyngeal dysphagia    hx neck radiation for left tonsil cancer ;  Speech therapy, regular diet with precautions swallows okay   Orthostatic hypotension    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   followed by ENT dr bates/  oncologist--- dr Bertis Ruddy;  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;  completed chemo 05-06-2016 and completed radiation of left tonsil/ bilateral neck 05-15-2016  w/ radiation injury to tongue (denervation chronic)   Wears glasses    Wears hearing aid in both ears     Past Surgical History:  Procedure Laterality Date   INGUINAL HERNIA REPAIR Left 05/02/2022   Procedure: OPEN LEFT INGUINAL HERNIA REPAIR WITH MESH;  Surgeon: Berna Bue, MD;  Location: [facility] [place];  Service: General;  Laterality: Left;   IR GASTROSTOMY TUBE MOD SED  11/07/2022   IR GASTROSTOMY TUBE REMOVAL  01/30/2017   IR GENERIC HISTORICAL  03/20/2016   IR US GUIDE VASC ACCESS RIGHT 03/20/2016 Simonne Come, MD [facility]   IR GENERIC HISTORICAL  03/20/2016   IR FLUORO GUIDE PORT INSERTION RIGHT 03/20/2016 Simonne Come, MD [facility]   IR GENERIC HISTORICAL  03/20/2016   IR GASTROSTOMY TUBE MOD SED 03/20/2016 Simonne Come, MD [facility]   IR REMOVAL TUN ACCESS W/ PORT W/O FL MOD SED  01/30/2017   LARYNGOSCOPY WITH BRONCHOSCOPY AND BALLOON DILATION N/A 04/14/2023   Procedure: LARYNGOSCOPY WITH BRONCHOSCOPY AND BALLOON DILATION;  Surgeon: Ashok Croon, MD;  Location: [facility] OR;  Service: ENT;  Laterality: N/A;   MICROLARYNGOSCOPY W/VOCAL CORD INJECTION Bilateral 04/14/2023   Procedure: MICROLARYNGOSCOPY WITH VOCAL CORD INJECTION;   Surgeon: Ashok Croon, MD;  Location: [facility] OR;  Service: ENT;  Laterality: Bilateral;   MYRINGOTOMY WITH TUBE PLACEMENT Right 04/14/2023   Procedure: MYRINGOTOMY WITH TUBE PLACEMENT;  Surgeon: Ashok Croon, MD;  Location: [facility] OR;  Service: ENT;  Laterality: Right;   RIGID BRONCHOSCOPY N/A 04/14/2023   Procedure: RIGID BRONCHOSCOPY;  Surgeon: Ashok Croon, MD;  Location: [facility] OR;  Service: ENT;  Laterality: N/A;    Family History  Problem Relation Age of Onset   Dementia Father    Diabetes Father    Cancer Maternal Grandmother        unknown ca   Asthma Son     Social  History:  reports that he has never smoked. He has never used smokeless tobacco. He reports that he does not currently use alcohol. He reports that he does not use drugs.  Allergies:  Allergies  Allergen Reactions   Phenergan [Promethazine Hcl]     Got really nervous and shaky..   Bactrim [Sulfamethoxazole-Trimethoprim] Other (See Comments)    Flu like symptoms    Tamsulosin Other (See Comments)    Caused orthostatic hypotension per pt    Medications: I have reviewed the patient's current medications.  The PMH, PSH, Medications, Allergies, and SH were reviewed and updated.  ROS: Constitutional: Negative for fever, weight loss and weight gain. Cardiovascular: Negative for chest pain and dyspnea on exertion. Respiratory: Is not experiencing shortness of breath at rest. Gastrointestinal: Negative for nausea and vomiting. Neurological: Negative for headaches. Psychiatric: The patient is not nervous/anxious  Blood pressure 97/62, pulse 73, SpO2 95%.  PHYSICAL EXAM:  Exam: General: Well-developed, well-nourished Communication and Voice: Raspy Respiratory Respiratory effort: Equal inspiration and expiration without stridor Cardiovascular Peripheral Vascular: Warm extremities with equal color/perfusion Eyes: No nystagmus with equal extraocular motion bilaterally Neuro/Psych/Balance: Patient oriented to  person, place, and time; Appropriate mood and affect; Gait is intact with no imbalance; 4 Cranial nerves: Previously seen tongue fasciculations but less pronounced today, left cranial nerve XII deficit, reduced soft palate movement on the left > right side likely due to fibrosis, VPI Head and Face Inspection: Normocephalic and atraumatic without mass or lesion Facial Strength: Facial motility symmetric and full bilaterally ENT Pinna: External ear intact and fully developed External canal: Canal is patent with intact skin Tympanic Membrane: right TM with T-tube, in good position and patent, no drainage, L TM normal no obvious air fluid level  External Nose: No scar or anatomic deformity Internal Nose: Septum is deviated to the left. No polyp, or purulence. Mucosal edema and erythema present.  Bilateral inferior turbinate hypertrophy.  Lips, Teeth, and gums: Mucosa and teeth intact and viable TMJ: No pain to palpation with full mobility Oral cavity/oropharynx: No erythema or exudate, no lesions present Nasopharynx: No mass or lesion with intact mucosa.  Thick yellow mucus and crusting along the nasopharynx. 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  Subglottic Space: Patent without lesion or edema Neck Neck and Trachea: Midline trachea without mass or lesion post XRT changes along left neck Thyroid: No mass or nodularity Lymphatics: No lymphadenopathy  Procedure:  Preoperative diagnosis: hoarseness L VF paralysis and dysphagia aspiration s/p b/l VF injection augmentation   Postoperative diagnosis:    same  Procedure: Flexible fiberoptic laryngoscopy with stroboscopy (16109)  Surgeon: Ashok Croon, MD  Anesthesia: Topical lidocaine and Afrin  Complications: None  Condition is stable throughout exam  Indications and consent:   The patient presents to the clinic with hoarseness. All the risks, benefits, and potential complications were reviewed with the patient preoperatively and informed verbal consent was obtained.  Procedure: The patient was seated upright in the exam chair.   Topical lidocaine and Afrin were applied to the nasal cavity. After adequate anesthesia had occurred, the flexible telescope was passed into the nasal cavity. The nasopharynx was patent without mass or lesion. The scope  was passed behind the soft palate and directed toward the base of tongue. The base of tongue was visualized and was symmetric with no apparent masses or abnormal appearing tissue. There were no signs of a mass or pooling of secretions in the piriform sinuses. The supraglottic structures were normal.  There was evidence of left VF paralysis. The medial edges were straight. Closure was complete. Periodicity was present. The mucosal wave and amplitude were intact. There is moderate interarytenoid pachydermia and post cricoid edema. The mucosa appears without lesions. There was thick yellow mucus along the nasopharynx/supraglottic structures/hypopharynx   The laryngoscope was then slowly withdrawn and the patient tolerated the procedure well. There were no complications or blood loss.    Studies Reviewed:CT chest 09/24/22 IMPRESSION: 1. Progressive diffuse peribronchovascular ground-glass, nodularity and consolidation with scattered septal thickening, findings which may be due to a worsening atypical/fungal infectious process. Lymphangitic carcinomatosis is not excluded. 2. Borderline enlarged mediastinal lymph nodes, possibly reactive. Recommend continued attention on follow-up as malignancy  cannot be excluded. 3. Coronary artery calcification  MRI neck 09/15/22 MRI OF THE NECK WITH CONTRAST   TECHNIQUE: Multiplanar, multisequence MR imaging was performed following the administration of intravenous contrast.   CONTRAST:  7mL GADAVIST GADOBUTROL 1 MMOL/ML IV SOLN   COMPARISON:  PET-CT 09/04/2022, CT neck 03/13/2021   FINDINGS: Pharynx and larynx: The nasal cavity and nasopharynx are unremarkable.   There is no abnormality in the tongue to correspond to the area of ill-defined radiotracer uptake seen on the recent PET-CT. There is no mass lesion or abnormal enhancement. There is no evidence of mass lesion or abnormal enhancement in the region of either tonsil. The oropharynx is unremarkable. Ill-defined T2 hyperintensity in the left parapharyngeal space with mild non masslike enhancement likely reflects posttreatment change, corresponding to ill-defined stranding seen on the CT neck going back to 2019.   The hypopharynx and larynx are unremarkable. The vocal folds are normal in appearance   There is no retropharyngeal fluid collection.  The airway is patent.   Salivary glands: The parotid and submandibular glands are unremarkable.   Thyroid: Unremarkable.   Lymph nodes: There is no pathologic lymphadenopathy in the neck.   Vascular: The major flow voids are normal.   Limited intracranial: Unremarkable.   Visualized orbits: Unremarkable.   Mastoids and visualized paranasal sinuses: There is mucosal thickening in the maxillary sinuses, left worse than right. There is a new right mastoid effusion.   Skeleton: There is multilevel degenerative change of the cervical spine with disc space narrowing most advanced at C4-C5. There is moderate spinal canal stenosis at C2-C3 through C4-C5.   Upper chest: The imaged lung apices are grossly unremarkable.   Other: None.   IMPRESSION: 1. No evidence of recurrent disease or pathologic lymphadenopathy in the neck.  Specifically, there is no suspicious finding in the tongue to correspond to the focus of ill-defined radiotracer uptake on the recent PET-CT. NIRADS 1. 2. Right mastoid effusion, nonspecific.  4//25/24 PET/CT  IMPRESSION: 1. Asymmetric focus of increased radiotracer uptake is identified within the left posterior tongue. Etiology indeterminate. Cannot exclude recurrent tumor. Consider further evaluation with direct visualization and/or contrast enhanced soft tissue neck CT or MRI. 2. No tracer avid cervical lymph nodes. 3. Heterogeneous, multifocal asymmetric increased uptake is identified within the left lower lobe. On the corresponding nondiagnostic CT images there is extensive tree-in-bud nodularity, thickening of the peribronchovascular interstitium, mucoid impaction, and atelectasis/airspace consolidation. Imaging findings are more likely to reflect a inflammatory/infectious process.  Suspect chronic recurrent aspiration. Advise clinical correlation for signs/symptoms aspiration. Additionally, follow-up imaging with diagnostic CT of the chest in 3 months is recommended to ensure resolution and to exclude a central obstructing lesion which could also account for these changes. 4. Mildly tracer avid lymph nodes within the mediastinum and left hilum. In the setting of inflammation/infection these are likely reactive. Metastatic adenopathy is considered less favored but cannot be excluded with a high degree of certainty in this patient who has a history of known malignancy. Attention to these lymph nodes on follow-up imaging is advised. 5. Diffuse tree-in-bud nodularity is also identified within the basilar right upper lobe, right middle lobe and right lower lobe. This is compatible with an inflammatory/infectious bronchiolitis and may be seen with recurrent aspiration. 6. Coronary artery calcifications. 7. Prostate gland enlargement. 8. Asymmetric opacification of the left  maxillary sinus. Correlate for any clinical signs or symptoms of sinusitis.   CT neck 04/06/23 reviewed - paranasal sinuses appear clear  CT neck and chest 04/06/23 - no evidence of disease recurrence  IMPRESSION: 1. No CT evidence of local recurrence or metastatic disease in the neck. 2. Chronic radiation related changes on the left. No adenopathy in the neck. 3. 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 This has been seen previously. 4. Cardiomegaly with LAD coronary artery calcification. 5. Chronic subpleural fibrosis in the anterior lung apices probably relates to XRT change. 6. Since last CT, interval clearing of the upper and right middle lobes, and significant improvement in the lower lobes, with resolution of tree-in-bud and consolidative change on the left and mild patchy residual tree-in-bud changes in the right lower lobe consistent with residual pneumonitis/small airways disease or microaspiration. 7. Thickened bands of consolidation in the posterior lower lobes, probably atelectatic, alternatively could be due to small areas of pneumonia. 8. New 6 mm subpleural right lower lobe nodule and 4 mm pleural-based nodule in the apex of the superior segment of the right lower lobe. Attention on follow-up scan recommended. 9. PEG tube in the body of the stomach. 10. Cholelithiasis. 11. Hepatic steatosis with stable chronic hypodensities in segment  Assessment/Plan: Encounter Diagnoses  Name Primary?   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 hearing of left ear    Dysphagia, unspecified type    Dysfunction of right eustachian tube    Vocal fold paralysis, left    History of oropharyngeal cancer    Paralysis of left side of tongue    Oropharyngeal dysphagia Yes   History of radiation to head and neck region     Aspiration into airway, initial encounter    Velopharyngeal insufficiency, acquired     62 year old male with history of cXRT for stage IVb left oropharyngeal cancer in 2017, who is here for initial consultation with me due to severe dysphagia primarily in oropharynx and dysarthria for approximately 1 year and recurrent aspiration pneumonia.  Had total of 4 aspiration pneumonia episodes and was seen by pulmonary with recommendation to follow with SLP, and limit antibiotic use.  Repeat MBS in June 2024 with evidence of pharyngeal dysphagia and aspiration.  Underwent PEG placement, and working with speech, liquid only intake by mouth and boost supplementation via PEG.  He reports significant decline in speech and swallowing about a year ago, and prior to that had normal speech and was tolerating regular diet without issues.   On  my exam today I was able to do evaluation of UES/upper esophagus and post-cricoid area in the office advancing flexible scope passed postcricoid area- no stricture seen.  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  There was significant pooling of secretions along the vallecula bilaterally.  There was evidence of left cranial nerve XII deficit, and diffuse tongue fasciculations.   I discussed exam findings with the patient.  My main concern is due to acute worsening of both speech/tongue mobility and evidence of diffuse tongue fasciculations, neurologic condition such as ALS needs to be excluded.  Other neurologic processes such as those affecting bulbar function also have to be ruled out.  Patient's severe pharyngeal dysphagia could still be related to sequela of radiation, but acute changes in his swallowing function are certainly suspicious for another cause.   I advised the patient to see his neurologist Dr Kate Sable at [facility] in October as scheduled and will communicate with his neurologist to ensure above concerns are addressed during his follow-up  Will consider procedural interventions in the future based on neurology evaluation and for further workup.  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  He will return after follow-up with his neurologist.  Will do videostrobe when he returns to determine if left vocal fold paralysis results in severe glottic insufficiency.   Update 04/23/23  S/p DML b/l injection augmentation EGD and balloon dilation to 21 mm at the UES/PES level and right ear tube placement 1 week ago. Developed stridor and throat tightness POD#2 came in to ED, all sx resolved after steroids IV in ED. He reports significant improvement in hearing and reduction in ear pressure following the placement of a tube in the right ear. However, he now experiences similar symptoms in the left ear, and would like a tube on the left side as well. Weight is stable. CT neck and chest with improvement of lung findings that were previously concerning for aspiration. Still on TEN via PEG.    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 Has FEES scheduled in 2 weeks and working with SLP - videostrobe today with correction of glottic gap post injection augmentation and evidence of thick yellow secretions in nasopharynx and along nasopharyngeal wall, likely 2/2 VPI and inability to blow his nose efficiently - Nasal saline rinses with NeilMed Bottle  - already given  Augmentin before without any change, will hold off on further abx since he did finish a course we gave him post-op recently along with steroids and burden of yellow secretions does appear to be reduced on exam today   2. Chronic nasal congestion and right sided middle ear effusion - nasal saline rinses with NeilMed - Flonase 2 puffs b/l nares BID  3. Chronic dysphagia and aspiration PEG/Dependent  -We discussed at length that a lot of his swallowing problems are due to postradiation side effects.   -Additionally evidence of bilateral VPI weakens the strength of swallow at the level of  the pharynx -ALS has been ruled out based on Neurology evaluation and cranial neuropathy post-XRT changes is a more likely scenario at this point  -proceed with FEES and continue to work with speech therapy on swallowing  4.  Eustachian tube dysfunction right side, recurrent issue per patient.  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 Left ear-  Normal to severe sensorineural hearing loss from 250 Hz - 8000 Hz - he would like to consider ear tube for left side, did not have fluid on exam today - will decide when he returns   5. Chronic GERD LPR - continue Protonix 40 mg Qam and Pepcid 20 mg at bedtime  F/u - return after FEES and visit with SLP January 2024    Ashok Croon, MD Otolaryngology [facility] Phone: [phone] Fax: [phone]  04/23/2023, 12:32 PM

## 2023-04-23 NOTE — Telephone Encounter (Signed)
I called Todd [name] to review the results of him most recent CT of the neck and chest. I personally reviewed the images with Dr. Basilio Cairo prior to this phone call. Imaging demonstrates no concerning signs of disease recurrence. We will schedule repeat imaging to be completed before his follow-up appointment in May.  CT of the chest incidentally showed cardiomegaly with LAD coronary artery calcification. He is being followed by cardiology. We shared these findings with the patient and his cardiologist. Patient is scheduled to see Dr. Elease Hashimoto on 06/08/2022.    Joyice Faster, PA-C

## 2023-04-29 ENCOUNTER — Encounter (INDEPENDENT_AMBULATORY_CARE_PROVIDER_SITE_OTHER): Payer: BC Managed Care – PPO | Admitting: Otolaryngology

## 2023-04-30 ENCOUNTER — Other Ambulatory Visit: Payer: Self-pay

## 2023-04-30 ENCOUNTER — Other Ambulatory Visit: Payer: Self-pay | Admitting: Radiology

## 2023-04-30 ENCOUNTER — Ambulatory Visit (HOSPITAL_COMMUNITY)
Admission: RE | Admit: 2023-04-30 | Discharge: 2023-04-30 | Disposition: A | Discharge status: Home / Self Care | Payer: BC Managed Care – PPO | Source: Ambulatory Visit | Attending: Radiology | Admitting: Radiology

## 2023-04-30 DIAGNOSIS — C099 Malignant neoplasm of tonsil, unspecified: Secondary | ICD-10-CM | POA: Diagnosis not present

## 2023-04-30 DIAGNOSIS — Z431 Encounter for attention to gastrostomy: Secondary | ICD-10-CM | POA: Insufficient documentation

## 2023-04-30 DIAGNOSIS — R1310 Dysphagia, unspecified: Secondary | ICD-10-CM | POA: Insufficient documentation

## 2023-04-30 HISTORY — PX: IR REPLC GASTRO/COLONIC TUBE PERCUT W/FLUORO: IMG2333

## 2023-04-30 MED ORDER — IOHEXOL 300 MG/ML  SOLN
50.0000 mL | Freq: Once | INTRAMUSCULAR | Status: AC | PRN
Start: 2023-04-30 — End: 2023-04-30
  Administered 2023-04-30: 10 mL

## 2023-04-30 NOTE — Progress Notes (Signed)
Oncology Nurse Navigator Documentation   [name] called me this morning and reported that Todd [name] was having new pain at his PEG site with movement and bleeding after they heard a "pop" sound yesterday evening. I contacted Interventional Radiology and they are able to evaluate him today at 10:00. I notified [name] of the time and they are agreeable and will be here at 10:00 for evaluation. They both know to call me if I can help with anything in the future.  Hedda Slade RN, BSN, OCN Head & Neck Oncology Nurse Navigator [facility] Phone # [phone]  Fax # [phone]

## 2023-04-30 NOTE — Procedures (Signed)
Pre procedural Dx: Dysphagia, poorly functioning feeding tube. Post procedural Dx: Same  Successful fluoroscopic guided replacement of existing 22 Fr gastrostomy tube.   The feeding tube is ready for immediate use.  EBL: None  Complications: None immediate.  Katherina Right, MD Pager #: [phone]

## 2023-05-01 ENCOUNTER — Inpatient Hospital Stay: Payer: BC Managed Care – PPO | Attending: Hematology and Oncology | Admitting: Dietician

## 2023-05-01 NOTE — Progress Notes (Signed)
Nutrition Follow-up:  Patient completed concurrent chemoradiation for tonsil cancer. Final radiation 05/15/16 under the care of Dr. Basilio Cairo. Patient now with dysphagia and recurrent aspiration pneumonia   S/p tube exchange 12/19  Met with patient and wife in office. Patient reports recent 12/3 surgical intervention was successful. Patient has been encouraged to begin eating and drinking per surgery. He is cautious about advancing diet and has not tried solids yet. Patient is drinking water orally. He continues to work with SLP. Patient is tolerating tube feedings at goal.    Medications: reviewed   Labs: 12/4 labs reviewed   Anthropometrics: Last wt 154 lb 1.6 oz (pt reports losing a couple pounds secondary to procedure)  11/26 - 159 lb 8 oz   Estimated Energy Needs  Kcals: 1610-9604 Protein: 103-116 Fluid: >2.2L  NUTRITION DIAGNOSIS: Inadequate oral intake ongoing - pt relying on tube    MALNUTRITION DIAGNOSIS: Severe malnutrition improving    INTERVENTION:  Continue Boost VHC - 5 cartons/day via tube  Encouraged trying high protein shake orally - if tolerated, suggested replacing one bolus feeding  Continue following with SLP Will continue working with patient to wean from tube as appropriate     MONITORING, EVALUATION, GOAL: wt trends, intake, TF   NEXT VISIT: Friday January 31 in office

## 2023-05-09 ENCOUNTER — Ambulatory Visit (HOSPITAL_COMMUNITY)
Admission: RE | Admit: 2023-05-09 | Discharge: 2023-05-09 | Disposition: A | Discharge status: Home / Self Care | Payer: BC Managed Care – PPO | Source: Ambulatory Visit | Attending: Cardiovascular Disease | Admitting: Cardiovascular Disease

## 2023-05-09 DIAGNOSIS — I77819 Aortic ectasia, unspecified site: Secondary | ICD-10-CM | POA: Diagnosis not present

## 2023-05-09 DIAGNOSIS — Z8679 Personal history of other diseases of the circulatory system: Secondary | ICD-10-CM | POA: Diagnosis not present

## 2023-05-09 MED ORDER — GADOBUTROL 1 MMOL/ML IV SOLN
7.0000 mL | Freq: Once | INTRAVENOUS | Status: AC | PRN
  Administered 2023-05-09: 7 mL via INTRAVENOUS

## 2023-05-11 ENCOUNTER — Ambulatory Visit (HOSPITAL_COMMUNITY)
Admission: RE | Admit: 2023-05-11 | Discharge: 2023-05-11 | Disposition: A | Discharge status: Home / Self Care | Payer: BC Managed Care – PPO | Source: Ambulatory Visit | Attending: Otolaryngology | Admitting: Otolaryngology

## 2023-05-11 ENCOUNTER — Ambulatory Visit (HOSPITAL_COMMUNITY)
Admission: RE | Admit: 2023-05-11 | Discharge: 2023-05-11 | Disposition: A | Discharge status: Home / Self Care | Payer: BC Managed Care – PPO | Source: Ambulatory Visit | Attending: Internal Medicine | Admitting: Internal Medicine

## 2023-05-11 DIAGNOSIS — R1310 Dysphagia, unspecified: Secondary | ICD-10-CM | POA: Insufficient documentation

## 2023-05-11 NOTE — Procedures (Addendum)
Objective Swallowing Evaluation: Type of Study: FEES-Fiberoptic Endoscopic Evaluation of Swallow   Patient Details  Name: Todd [name] MRN: [id number] Date of Birth: 1961/03/14  Today's Date: 05/11/2023 Time: SLP Start Time (ACUTE ONLY): 1003 -SLP Stop Time (ACUTE ONLY): 1128  SLP Time Calculation (min) (ACUTE ONLY): 85 min   Past Medical History:  Past Medical History:  Diagnosis Date   Acquired hypothyroidism    secondary to radiation //   followed by pcp   Arthritis    shoulders, neck, knees, feet   Bilateral sensorineural hearing loss    due to radiation   Decreased range of motion of neck    due to fibrotic changes of 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   Denervation atrophy of muscle    tongue ,  chronic ,  due to radiation injury   (neurologist-- dr aKate Sable ([facility] [place])  EMG in care everywhere done 03-11-2022   Frequency of urination    GERD (gastroesophageal reflux disease)    History of cancer chemotherapy 2017   tonsil cancer   03-24-2016  to 05-06-2016   History of external beam radiation therapy    03-24-2016  to 05-15-2016  Left Tonsil and Bilateral Neck  @ [facility]   HLD (hyperlipidemia)    Hoarseness, chronic    due to radiation   Hyperlipidemia, mixed    Hypertension    pt states he has not had HTN since June 2024 (he has not taken BP meds since then since his BP has been on the lower side)   Inguinal hernia, left    Lower facial weakness    intermittant left lower facial weakness   Numbness and tingling in left arm    intermittently per pt   Oropharyngeal dysphagia    hx neck radiation for left tonsil cancer ;  Speech therapy, regular diet with precautions swallows okay   Orthostatic hypotension    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   followed by ENT dr bates/  oncologist--- dr Bertis Ruddy;  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;  completed chemo 05-06-2016 and completed radiation of left tonsil/ bilateral neck 05-15-2016  w/ radiation injury to  tongue (denervation chronic)   Wears glasses    Wears hearing aid in both ears    Past Surgical History:  Past Surgical History:  Procedure Laterality Date   INGUINAL HERNIA REPAIR Left 05/02/2022   Procedure: OPEN LEFT INGUINAL HERNIA REPAIR WITH MESH;  Surgeon: Berna Bue, MD;  Location: [facility];  Service: General;  Laterality: Left;   IR GASTROSTOMY TUBE MOD SED  11/07/2022   IR GASTROSTOMY TUBE REMOVAL  01/30/2017   IR GENERIC HISTORICAL  03/20/2016   IR US GUIDE VASC ACCESS RIGHT 03/20/2016 Simonne Come, MD [facility]   IR GENERIC HISTORICAL  03/20/2016   IR FLUORO GUIDE PORT INSERTION RIGHT 03/20/2016 Simonne Come, MD [facility]   IR GENERIC HISTORICAL  03/20/2016   IR GASTROSTOMY TUBE MOD SED 03/20/2016 Simonne Come, MD [facility]   IR REMOVAL TUN ACCESS W/ PORT W/O FL MOD SED  01/30/2017   IR REPLC GASTRO/COLONIC TUBE PERCUT W/FLUORO  04/30/2023   LARYNGOSCOPY WITH BRONCHOSCOPY AND BALLOON DILATION N/A 04/14/2023   Procedure: LARYNGOSCOPY WITH BRONCHOSCOPY AND BALLOON DILATION;  Surgeon: Ashok Croon, MD;  Location: [facility];  Service: ENT;  Laterality: N/A;   MICROLARYNGOSCOPY W/VOCAL CORD INJECTION Bilateral 04/14/2023   Procedure: MICROLARYNGOSCOPY WITH VOCAL CORD INJECTION;  Surgeon: Ashok Croon, MD;  Location: [facility];  Service: ENT;  Laterality: Bilateral;   MYRINGOTOMY WITH TUBE PLACEMENT Right 04/14/2023   Procedure: MYRINGOTOMY WITH TUBE PLACEMENT;  Surgeon: Ashok Croon, MD;  Location: [facility];  Service: ENT;  Laterality: Right;   RIGID BRONCHOSCOPY N/A 04/14/2023   Procedure: RIGID BRONCHOSCOPY;  Surgeon: Ashok Croon, MD;  Location: [facility];  Service: ENT;  Laterality: N/A;   HPI: Pt is a 62 yo male referred for OP FEES by ENT (several of her findings listed under PMH). He is s/p recent VF augmentation and EGD balloon dilation of PES, (injection for VPI considered, but not done yet). Pt also followed by OP SLP and most recent MBS in June 2024  showed a severe pharyngo-cervical esophageal dysphagia. CN XII deficits included impaired lingual transit and lingual fasciculations were noted (question of ALS by ENT but has since been ruled out). Severe hypomotility within the pharyngeal phase allowed minimal barium to enter the esophagus and penetration/aspiration occurred. A posterior lean increased airway protection, but pt was considered to be at a heightened risk for aspiration and malnutrition. Pt gets his nutrition via PEG, but has been drinking water (up to almost 8oz in a day) and on special occasions will have small bites of soft foods. PMH includes: malignant neoplasm of tonsillar fossa (s/p chemo, XRT L tonsil/bilateral neck with radiation injury to tongue 2017-2018), age-related vocal fold atrophy, paralysis of L vocal fold, glottic insufficiency, vocal cord edema, stridor, oropharyngeal dysphagia, recurrent aspiration PNA (4x in the last year, most recently in April), pulmonary infiltrates, jaw pain, wears glasses, wears hearing aids bilaterally   Subjective: pt says he is drinking up to almost 8oz of water in a day; occasionally has other really soft foods (like for special occasions)    Recommendations for follow up therapy are one component of a multi-disciplinary discharge planning process, led by the attending physician.  Recommendations may be updated based on patient status, additional functional criteria and insurance authorization.  Assessment / Plan / Recommendation     05/11/2023   12:00 PM  Clinical Impressions  Clinical Impression Pt appears to have improved adduction of his vocal folds, with some movement noted on his L side. Pt says his glottis was "wide open" but his vocal folds appear to approximate especially on anterior end. When cued to hold his breath and bear down, he approximates his fasle vocal folds, completely obstructing view of his true vocal folds.   Oral phase is not directly observed during FEES, but pt  reports limited lingual transit as consistencies get thicker. He needed to use liquid washes to try to swallow a small amount of puree.  Pt's pharyngeal phase of swallowing is still heavily impacted by generalized hypomobility, and aspiration still occurs across consistencies. Although likely similar to [facility] in June in many regards, suspect that his improved ability to adduct his cords may help some with expectoration. A combination of a reclined position and a super supraglottic swallow maneuver helps to increase airway protection during the swallow. Residue (especially in the pyriform sinuses) spills over the arytenoids and into his airway after the swallow as well. He has similar amounts of residue given thin and nectar thick liquids, and he is able to cough some of the penetrated/aspirated material back out of his airway. Aspiration is not fully cleared, as he does have some productive coughing throughout the study that has evidence of the green food coloring used for this study. He seems to have increased difficulty clearing his airway and his pharynx with honey  thick liquids and purees.   When allowed to self-regulate intake with thin liquids while using recommended strategies, he does seem to clear a lot (but not all) from his airway. He clears more this way than he does when taking single ice chips. Discussed results with pt and wife, who was able to watch this study as well. Would continue to at least consume water after oral care as he has been doing, along with HEP given by OP SLP. Encouraged him to try to start encorporating the super supraglottic swallow as well. We discussed the potential to advance to some other liquids outside of just water at the discretion of his primary team (SLP, ENT, etc.), maybe as he starts to feel back to his baseline from recent medical events. Education was provided on risk factors, outside of aspiration, that impact the risk for adverse events to occur and trying to  optimize other areas (oral care, physical mobility, GI and pulmonary status, etc.) given known aspiration.   SLP Visit Diagnosis Dysphagia, oropharyngeal phase (R13.12)  Attention and concentration deficit following --  Frontal lobe and executive function deficit following --  Impact on safety and function Moderate aspiration risk;Severe aspiration risk;Risk for inadequate nutrition/hydration         05/11/2023   12:00 PM  Treatment Recommendations  Treatment Recommendations Defer treatment plan to f/u with SLP        05/11/2023   12:00 PM  Prognosis  Prognosis for improved oropharyngeal function Good  Barriers to Reach Goals Time post onset;Severity of deficits  Barriers/Prognosis Comment --       05/11/2023   12:00 PM  Diet Recommendations  SLP Diet Recommendations Free water protocol after oral care  Liquid Administration via Cup  Medication Administration Via alternative means  Compensations Slow rate;Multiple dry swallows after each bite/sip;Hard cough after swallow;Other (Comment)  Postural Changes Other (Comment)         05/11/2023   12:00 PM  Other Recommendations  Recommended Consults --  Oral Care Recommendations Oral care QID  Caregiver Recommendations --  Follow Up Recommendations Outpatient SLP  Assistance recommended at discharge --  Functional Status Assessment --        No data to display               05/11/2023   12:00 PM  Oral Phase  Oral Phase --  Oral - Pudding Teaspoon --  Oral - Pudding Cup --  Oral - Honey Teaspoon --  Oral - Honey Cup --  Oral - Nectar Teaspoon --  Oral - Nectar Cup --  Oral - Nectar Straw --  Oral - Thin Teaspoon --  Oral - Thin Cup --  Oral - Thin Straw --  Oral - Puree --  Oral - Mech Soft --  Oral - Regular --  Oral - Multi-Consistency --  Oral - Pill --  Oral Phase - Comment --       05/11/2023   12:00 PM  Pharyngeal Phase  Pharyngeal Phase Impaired  Pharyngeal- Pudding Teaspoon --   Pharyngeal --  Pharyngeal- Pudding Cup --  Pharyngeal --  Pharyngeal- Honey Teaspoon --  Pharyngeal --  Pharyngeal- Honey Cup Reduced pharyngeal peristalsis;Reduced epiglottic inversion;Reduced anterior laryngeal mobility;Reduced laryngeal elevation;Reduced airway/laryngeal closure;Reduced tongue base retraction;Penetration/Aspiration before swallow;Penetration/Aspiration during swallow;Penetration/Apiration after swallow;Pharyngeal residue - valleculae;Pharyngeal residue - pyriform;Inter-arytenoid space residue  Pharyngeal Material enters airway, passes BELOW cords and not ejected out despite cough attempt by patient  Pharyngeal- Nectar Teaspoon --  Pharyngeal --  Pharyngeal- Nectar Cup Reduced pharyngeal peristalsis;Reduced epiglottic inversion;Reduced anterior laryngeal mobility;Reduced laryngeal elevation;Reduced airway/laryngeal closure;Reduced tongue base retraction;Penetration/Aspiration before swallow;Penetration/Aspiration during swallow;Penetration/Apiration after swallow;Pharyngeal residue - valleculae;Pharyngeal residue - pyriform;Inter-arytenoid space residue  Pharyngeal Material enters airway, passes BELOW cords and not ejected out despite cough attempt by patient  Pharyngeal- Nectar Straw --  Pharyngeal --  Pharyngeal- Thin Teaspoon Reduced pharyngeal peristalsis;Reduced epiglottic inversion;Reduced anterior laryngeal mobility;Reduced laryngeal elevation;Reduced airway/laryngeal closure;Reduced tongue base retraction;Penetration/Aspiration before swallow;Penetration/Aspiration during swallow;Penetration/Apiration after swallow;Pharyngeal residue - valleculae;Pharyngeal residue - pyriform;Inter-arytenoid space residue  Pharyngeal Material enters airway, passes BELOW cords and not ejected out despite cough attempt by patient  Pharyngeal- Thin Cup Reduced pharyngeal peristalsis;Reduced epiglottic inversion;Reduced anterior laryngeal mobility;Reduced laryngeal elevation;Reduced  airway/laryngeal closure;Reduced tongue base retraction;Penetration/Aspiration before swallow;Penetration/Aspiration during swallow;Penetration/Apiration after swallow;Pharyngeal residue - valleculae;Pharyngeal residue - pyriform;Inter-arytenoid space residue  Pharyngeal Material enters airway, passes BELOW cords and not ejected out despite cough attempt by patient  Pharyngeal- Thin Straw --  Pharyngeal --  Pharyngeal- Puree Reduced pharyngeal peristalsis;Reduced epiglottic inversion;Reduced anterior laryngeal mobility;Reduced laryngeal elevation;Reduced airway/laryngeal closure;Reduced tongue base retraction;Penetration/Aspiration before swallow;Penetration/Aspiration during swallow;Penetration/Apiration after swallow;Pharyngeal residue - valleculae;Pharyngeal residue - pyriform;Inter-arytenoid space residue  Pharyngeal Material enters airway, passes BELOW cords and not ejected out despite cough attempt by patient  Pharyngeal- Mechanical Soft --  Pharyngeal --  Pharyngeal- Regular --  Pharyngeal --  Pharyngeal- Multi-consistency --  Pharyngeal --  Pharyngeal- Pill --  Pharyngeal --  Pharyngeal Comment --        05/11/2023   12:00 PM  Cervical Esophageal Phase   Cervical Esophageal Phase Impaired  Pudding Teaspoon --  Pudding Cup --  Honey Teaspoon --  Honey Cup Reduced cricopharyngeal relaxation  Nectar Teaspoon --  Nectar Cup Reduced cricopharyngeal relaxation  Nectar Straw --  Thin Teaspoon Reduced cricopharyngeal relaxation  Thin Cup Reduced cricopharyngeal relaxation  Thin Straw --  Puree Reduced cricopharyngeal relaxation  Mechanical Soft --  Regular --  Multi-consistency --  Pill --  Cervical Esophageal Comment --     Mahala Menghini., M.A. CCC-SLP Acute Rehabilitation Services Office [phone]  Secure chat preferred  05/11/2023, 1:12 PM

## 2023-05-12 ENCOUNTER — Ambulatory Visit (INDEPENDENT_AMBULATORY_CARE_PROVIDER_SITE_OTHER): Payer: BC Managed Care – PPO | Admitting: Audiology

## 2023-05-15 DIAGNOSIS — Z931 Gastrostomy status: Secondary | ICD-10-CM | POA: Diagnosis not present

## 2023-05-15 DIAGNOSIS — R634 Abnormal weight loss: Secondary | ICD-10-CM | POA: Diagnosis not present

## 2023-05-15 DIAGNOSIS — Z85818 Personal history of malignant neoplasm of other sites of lip, oral cavity, and pharynx: Secondary | ICD-10-CM | POA: Diagnosis not present

## 2023-05-15 DIAGNOSIS — Y842 Radiological procedure and radiotherapy as the cause of abnormal reaction of the patient, or of later complication, without mention of misadventure at the time of the procedure: Secondary | ICD-10-CM | POA: Diagnosis not present

## 2023-05-18 ENCOUNTER — Ambulatory Visit: Payer: BC Managed Care – PPO | Attending: Radiation Oncology

## 2023-05-18 DIAGNOSIS — R1312 Dysphagia, oropharyngeal phase: Secondary | ICD-10-CM

## 2023-05-18 DIAGNOSIS — R471 Dysarthria and anarthria: Secondary | ICD-10-CM

## 2023-05-18 NOTE — Patient Instructions (Signed)
   SWALLOWING EXERCISES Do these for 6 weeks straight, then at least 3 times per week afterwards You can use a wet spoon to swallow something, if your mouth gets dry  Effortful Swallows - Press your tongue against the roof of your mouth for 3 seconds, then swallow your saliva as hard as you can  - Do at least 20 reps/day, in sets of 5-10  Masako Swallow - swallow with your tongue sticking out - Stick tongue out past your lips and gently bite tongue with your teeth - Swallow, while holding your tongue with your teeth - Do at least 20 reps/day, in sets of 5-10  Tongue Stretch/Teeth Clean - Move your tongue around the pocket between your gums and teeth 10 times clockwise, and then 10 times  counter-clockwise - Repeat on the back side, 10 times clockwise and then 10 times counter-clockwise - Repeat twice a day       8. Super Swallow  - Take a breath and hold it, BEAR DOWN  - Swallow then IMMEDIATELY cough, swallow again  - Do at least 20 reps/day, in sets of 5-10     Signs of Aspiration Pneumonia   Chest pain/tightness Fever (can be low grade) Cough  With foul-smelling phlegm (sputum) With sputum containing pus or blood With greenish sputum Fatigue  Shortness of breath  Wheezing   **IF YOU HAVE THESE SIGNS, CONTACT YOUR DOCTOR OR GO TO THE EMERGENCY DEPARTMENT OR URGENT CARE AS SOON AS POSSIBLE**

## 2023-05-18 NOTE — Therapy (Signed)
 OUTPATIENT SPEECH LANGUAGE PATHOLOGY SWALLOW TREATMENT   Patient Name: Todd [name] MRN: [id number] DOB:[birth year], 63 y.o., male Today's Date: 05/18/2023  PCP: Alanson Ade, MD REFERRING PROVIDER: Izell Domino, MD  END OF SESSION:  End of Session - 05/18/23 1623     Visit Number 14    Number of Visits 17    Date for SLP Re-Evaluation 05/21/23    SLP Start Time 1455    SLP Stop Time  1540    SLP Time Calculation (min) 45 min    Activity Tolerance Patient tolerated treatment well                     Past Medical History:  Diagnosis Date   Acquired hypothyroidism    secondary to radiation //   followed by pcp   Arthritis    shoulders, neck, knees, feet   Bilateral sensorineural hearing loss    due to radiation   Decreased range of motion of neck    due to fibrotic changes of 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   Denervation atrophy of muscle    tongue ,  chronic ,  due to radiation injury   (neurologist-- dr aSABRA moores ([facility])  EMG in care everywhere done 03-11-2022   Frequency of urination    GERD (gastroesophageal reflux disease)    History of cancer chemotherapy 2017   tonsil cancer   03-24-2016  to 05-06-2016   History of external beam radiation therapy    03-24-2016  to 05-15-2016  Left Tonsil and Bilateral Neck  @ [facility]   HLD (hyperlipidemia)    Hoarseness, chronic    due to radiation   Hyperlipidemia, mixed    Hypertension    pt states he has not had HTN since June 2024 (he has not taken BP meds since then since his BP has been on the lower side)   Inguinal hernia, left    Lower facial weakness    intermittant left lower facial weakness   Numbness and tingling in left arm    intermittently per pt   Oropharyngeal dysphagia    hx neck radiation for left tonsil cancer ;  Speech therapy, regular diet with precautions swallows okay   Orthostatic hypotension    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   followed by ENT dr bates/   oncologist--- dr lonn;  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;  completed chemo 05-06-2016 and completed radiation of left tonsil/ bilateral neck 05-15-2016  w/ radiation injury to tongue (denervation chronic)   Wears glasses    Wears hearing aid in both ears    Past Surgical History:  Procedure Laterality Date   INGUINAL HERNIA REPAIR Left 05/02/2022   Procedure: OPEN LEFT INGUINAL HERNIA REPAIR WITH MESH;  Surgeon: Signe Mitzie LABOR, MD;  Location: [facility];  Service: General;  Laterality: Left;   IR GASTROSTOMY TUBE MOD SED  11/07/2022   IR GASTROSTOMY TUBE REMOVAL  01/30/2017   IR GENERIC HISTORICAL  03/20/2016   IR US  GUIDE VASC ACCESS RIGHT 03/20/2016 Norleen Roulette, MD [facility]   IR GENERIC HISTORICAL  03/20/2016   IR FLUORO GUIDE PORT INSERTION RIGHT 03/20/2016 Norleen Roulette, MD [facility]   IR GENERIC HISTORICAL  03/20/2016   IR GASTROSTOMY TUBE MOD SED 03/20/2016 Norleen Roulette, MD [facility]   IR REMOVAL TUN ACCESS W/ PORT W/O FL MOD SED  01/30/2017   IR REPLC GASTRO/COLONIC TUBE PERCUT W/FLUORO  04/30/2023   LARYNGOSCOPY WITH  BRONCHOSCOPY AND BALLOON DILATION N/A 04/14/2023   Procedure: LARYNGOSCOPY WITH BRONCHOSCOPY AND BALLOON DILATION;  Surgeon: Okey Burns, MD;  Location: [facility];  Service: ENT;  Laterality: N/A;   MICROLARYNGOSCOPY W/VOCAL CORD INJECTION Bilateral 04/14/2023   Procedure: MICROLARYNGOSCOPY WITH VOCAL CORD INJECTION;  Surgeon: Okey Burns, MD;  Location: [facility];  Service: ENT;  Laterality: Bilateral;   MYRINGOTOMY WITH TUBE PLACEMENT Right 04/14/2023   Procedure: MYRINGOTOMY WITH TUBE PLACEMENT;  Surgeon: Okey Burns, MD;  Location: [facility];  Service: ENT;  Laterality: Right;   RIGID BRONCHOSCOPY N/A 04/14/2023   Procedure: RIGID BRONCHOSCOPY;  Surgeon: Okey Burns, MD;  Location: [facility];  Service: ENT;  Laterality: N/A;   Patient Active Problem List   Diagnosis Date Noted   Acute respiratory distress 04/17/2023   Stridor  04/16/2023   Vocal fold paralysis, left 04/16/2023   Vocal cord edema 04/15/2023   Aspiration into airway 04/14/2023   Age-related vocal fold atrophy 04/14/2023   Paralysis of left vocal fold 04/14/2023   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   Malignant neoplasm of tonsillar fossa (HCC) 01/07/2023   Pulmonary infiltrates 09/08/2022   Hypothyroidism    Right hip pain 12/06/2018   CKD (chronic kidney disease), stage III (HCC) 11/30/2017   Anemia, chronic disease 11/30/2017   Multiple lung nodules on CT 09/08/2016   Pharyngoesophageal dysphagia 06/13/2016   Pancytopenia, acquired (HCC) 05/26/2016   Deficiency anemia 05/26/2016   Splenic lesion 05/16/2016   Jaw pain 04/08/2016   Essential hypertension 03/18/2016   Weight loss 02/26/2016   Tonsil cancer (HCC) 02/21/2016     ONSET DATE: 2018   REFERRING DIAG: C09.9 (ICD-10-CM) - Tonsil cancer   THERAPY DIAG:  No diagnosis found.  Rationale for Evaluation and Treatment: Rehabilitation  SUBJECTIVE:   SUBJECTIVE STATEMENT: Pt has not drank anything since prior to 04/14/23 - sx for balloon dilation, vocal fold injection, and tubes placed.  Two days following sx, pt was admitted to ED for 24 hour observation.  Pt accompanied by: significant other  PERTINENT HISTORY: From Dr. Izell note May 2024: The patient returns today for follow-up after he approached me with new symptoms at our head and neck annual survivorship celebration.  Mr. Shawgo presents for follow up of radiation completed 05/15/16 to his Tonsillar fossa/oropharynx.  Recent PET and MRI below were reviewed at tumor board, NED.  See below. However, he has had swallowing issues thought to be related to permanent changes in his tongue related to his past locally advanced oropharyngeal cancer and treatments (however it is unclear what role his esophagus may also be playing in dysphagia), aspiration PNA (just saw pulmonology for this), degenerative C spine issues affecting his spinal  cord necessitating decompressive surgery for which he has yet to be scheduled.  He is also losing weight.  PAIN:  Are you having pain? Yes: NPRS scale: 3/10 Pain location: cervical neck down to mid-back Pain description: annoying pain, makes it hard to sleep Aggravating factors: certain movement Relieving factors: meds  FALLS: Has patient fallen in last 6 months?  No  PATIENT GOALS: Swallow safely  OBJECTIVE: SOLDATOVA 04/16/23: Procedure: Flexible fiberoptic laryngoscopy Surgeon: Burns Okey, MD Anesthesia: Topical lidocaine  and Afrin Complications: None Condition is stable throughout exam   Indications and consent:  The patient presents to the clinic with Indirect laryngoscopy view was incomplete. Thus it was recommended that they undergo a flexible fiberoptic laryngoscopy. All of the risks, benefits, and potential complications were reviewed with the patient preoperatively and verbal informed consent was  obtained.  Procedure: The patient was seated upright in the clinic. Topical lidocaine  and Afrin were applied to the nasal cavity. After adequate anesthesia had occurred, I then proceeded to pass the flexible telescope into the nasal cavity. The nasal cavity was patent without rhinorrhea or polyp. The nasopharynx was also patent without mass or lesion. The base of tongue was visualized and was normal. There were no signs of pooling of secretions in the piriform sinuses. The true vocal folds had evidence of previously seen L VF paralysis and evidence of injection augmentation, with mild edema. Subglottis was patent. There were no signs of glottic or supraglottic mucosal lesion or mass. There was moderate interarytenoid pachydermia and post cricoid edema. The telescope was then slowly withdrawn and the patient tolerated the procedure throughout.  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  Had total of 4 aspiration pneumonia episodes and was seen by pulmonary with recommendation to follow with SLP, and limit antibiotic use.  Repeat MBS in June 2024 with evidence of pharyngeal dysphagia and aspiration.  Underwent PEG placement, and working with speech, liquid only intake by mouth and boost supplementation via PEG.  He reports significant decline in speech and swallowing about a year ago, and prior to that had normal speech and was tolerating regular diet without issues.  On my exam today I was able to do evaluation of UES/upper esophagus and post-cricoid area in the office advancing flexible scope passed postcricoid area- no stricture seen.  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  There was significant pooling of secretions along the vallecula bilaterally.  There was evidence of left cranial nerve XII deficit, and diffuse tongue fasciculations.  I discussed exam findings with the patient.  My main concern is due to acute worsening of both speech/tongue mobility and evidence of diffuse tongue fasciculations, neurologic condition such as ALS needs to be excluded.  Other neurologic processes such as those affecting bulbar function also have to be ruled out.  Patient's severe pharyngeal dysphagia could still be related to sequela of radiation, but acute changes in his swallowing function are certainly suspicious for another cause.  I advised the patient to see his neurologist Dr Rawland at [facility] in October as scheduled and will communicate with his neurologist to ensure above concerns are addressed during his follow-up. Will consider procedural interventions in the future based on neurology  evaluation and for further workup.  Specifically will consider management of VPI and glottic insufficiency Patient will follow-up with his SLP and continue swallow therapy and aspiration precautions including good oral hygiene and strategies to minimize aspiration.  He will return after follow-up with his neurologist.  Will do videostrobe when he returns to determine if left vocal fold paralysis results in severe glottic insufficiency.  Update 03/16/23 He drinks milk shakes for pleasure, but is still PEG dependent. He is not sure if swallow therapy is helping.  He had multiple aspiration PNA's in the past, last one Feb 2024 No fever or dyspnea at home. Post-XRT sequelae, L VF paralysis chronic dysphonia and dysphagia PEG dependence 2/2 aspiration PNA - videostrobe today with incomplete glottic closure and evidence  of thick yellow secretions in hypo/oropharynx and along naso pharyngeal wall - Augmentin  suspension BID x 2 weeks and steroid pack (Medrol  dose pack) 2. Chronic nasal congestion and right sided middle ear effusion - Augmentin /Medrol  pack as above  - nasal saline rinses with NeilMed - Flonase  2 puffs b/l nares BID 3. Chronic dysphagia and aspiration PEG/Dependent  -We discussed at length that a lot of his swallowing problems are due to postradiation side effects.  We also discussed that there could be a stricture above the level of UES and those are common in head neck cancer survivors.  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 -We also  discussed the need to get neurology evaluation to ensure he does not have any underlying neurologic conditions such as ALS due to history of normal swallowing after radiation and acute onset trouble with swallowing approximately a year ago -Will schedule for DML rigid bronchoscopy bilateral vocal fold injection augmentation, possible posterior pharyngeal wall soft palate injection augmentation, EGD and balloon dilation 4.  Eustachian tube dysfunction right side, recurrent issue per patient.  He reports poor hearing on the left side, and right side is his better ear -Will order audiogram to evaluate hearing and middle ear function -If evidence of fluid on tympanograms will consider tube placement on the right side at the time of his procedure 5. Chronic GERD LPR - continue Pepcid     RESULTS FROM OBJECTIVE SWALLOSW STUDY  FEES 05/11/23: Pt appears to have improved adduction of his vocal folds, with some movement noted on his L side. Pt says his glottis was wide open but his vocal folds appear to approximate especially on anterior end. When cued to hold his breath and bear down, he approximates his fasle vocal folds, completely obstructing view of his true vocal folds.    Oral phase is not directly observed during FEES, but pt reports limited lingual transit as consistencies get thicker. He needed to use liquid washes to try to swallow a small amount of puree.   Pt's pharyngeal phase of swallowing is still heavily impacted by generalized hypomobility, and aspiration still occurs across consistencies. Although likely similar to MBS in June in many regards, suspect that his improved ability to adduct his cords may help some with expectoration. A combination of a reclined position and a super supraglottic swallow maneuver helps to increase airway protection during the swallow. Residue (especially in the pyriform sinuses) spills over the arytenoids and into his airway after the swallow as well. He has  similar amounts of residue given thin and nectar thick liquids, and he is able to cough some of the penetrated/aspirated material back out of his airway. Aspiration is not fully cleared, as he does have some productive coughing throughout the study that has evidence of the green food coloring used for this study. He seems to have increased difficulty clearing his airway and his pharynx with honey thick liquids and purees.    When allowed to self-regulate intake with thin liquids while using recommended strategies, he does seem to clear a lot (but not all) from his  airway. He clears more this way than he does when taking single ice chips. Discussed results with pt and wife, who was able to watch this study as well. Would continue to at least consume water after oral care as he has been doing, along with HEP given by OP SLP. Encouraged him to try to start encorporating the super supraglottic swallow as well. We discussed the potential to advance to some other liquids outside of just water at the discretion of his primary team (SLP, ENT, etc.), maybe as he starts to feel back to his baseline from recent medical events. Education was provided on risk factors, outside of aspiration, that impact the risk for adverse events to occur and trying to optimize other areas (oral care, physical mobility, GI and pulmonary status, etc.) given known aspiration.              Impact on safety and function Moderate aspiration risk;Severe aspiration risk;Risk for inadequate nutrition/hydration    (MBS)-  10/30/22:  Clinical Impression: Patient presents with mild oral and severe pharyngo-cervical esophageal dysphagia presumed due to iatrogenic effects of XRT that is significantly worse compared to prior MBS conducted by this SLP in 2018. SLP reviewed note from MBS at [facility] in 2023/11 but could not view images. Hypoglossal nerve deficit with impaired left lingual motility and presence of fasciulations impacts oral  control/strength. Head tilt to the right attempted to aid oral transiting/control with partial effectiveness. Pharyngeal swallow marked by sevfere hypomotility resulting in minimal transiting of barium into esophagus and laryngeal penetration/aspiration. Mulitple dry swallows *pt initiated* is helpful to decrease amount of retention but does not fuly clear it, even after 5-6 swallows with small single liquid bolus. Head of chair reclined to approx 45* with slight neck extension did decrease amount of penetration and aspiration as gravity faciliated retention of barium in posterior pharynx. Even in posterior position and with use of right tongue for oral transiting, pt penetrated very small bite of masticated cracker after the swallow as pharyngeal retention spilled into open airway. Cued cough effectively cleared cracker into pharynx for pt to swallow with liquids. Clearance into cervical esophagus was significantly impaired with gross retention of liquids as pyriform sinus. Question if he may benefit from intervention for PES opening to mitigate his dysphagia - advised he speak to Dr Carlie, ENT, re: potential.   Unfortunately [facility] current dysphagia is profound and given progressive deficit (presumed from XRT effects, fibrosis) for 15 months, prognosis for swallow to return to functional level is guarded at best.    Advised pt consider ordering antichoking device for emergent use.  Recommended diet:PO diet PO Diet Recommendation: Thin liquids (Level 0); Mildly thick liquids (Level 2, nectar thick); Full liquid diet, some soft solids Liquid Administration via: Cup; Straw; Spoon Medication Administration: Other (Comment) (consider suspension). Supervision: Patient able to self-feed Recommended compensations:  Swallowing strategies  : effortful swallow; Multiple dry swallows after each bite/sip; Hard cough after swallowing; Head tilt right during swallowing; Slow rate; Small bites/sips (Recline approx  45* with slight chin elevation), Follow solids with liquids Postural changes: Partially reclined for meals Oral care recommendations: Oral care QID (4x/day) Recommended consults: Consider ENT consultation (? ENT to determine if potential PES treatment may be helpful)   PATIENT REPORTED OUTCOME MEASURES (PROM): EAT-10: Pt returned 12/05/22 and scored 36/40 (lower scores = better QOL)   TODAY'S TREATMENT:  DATE:  05/18/23: Reviewed results of FEES with pt/wife. Pt cont without overt s/sx aspiration PNA. Gavynn endorses he feels more lethargic/weaker now than prior to vocal fold surgery but better than during FEES, and he states, Each day is better.. Prior to vocal fold augmentation sx, pt was not completing HEP exercises >10 reps/day. SLP and pt agreed pt will perform at least 20 reps/day of Masako, effortful swallow, super-supraglottic, and tongue ROM 7 days/week for the next 6 weeks, then pt will cont with 20 reps/day at least 4 days per week to maintain strength. SLP told pt to perform thorough oral care prior to water POs, and he could try liquid yogurt and Boost/Ensure as well. SLP told pt he will likely always need to rely on PEG for adequate nutrition and hydration. SLP told pt SLP sees this as pt's new normal, and SLP sees pt enjoying one or two meal items of proper/safe consistency with family at holidays, getting ice cream once every few weeks with wife, etc. Pt admitted to SLP he had 4-5 bites of Thanksgiving and Christmas dinners with family during those holidays and SLP confirmed that was what SLP meant, and Ellison needed to realize he was likely aspirating some fraction of bolus, and if he chose to do this, to ALWAYS CLOSELY monitor for overt s/sx aspiration PNA. Deward stated he was fine with having POs in this manner, knowing he was likely aspirating a fraction of  some/all boluses. Dorthea shared she still had handout from SLP about sx aspiration PNA and was still familiar with s/sx aspiration PNA. Artha confirmed knowledge of overt s/sx aspiration PNA as well.  Pt performed 5 reps of each exercise in pt instructions - effortful, Masako, super-supraglottic, and lingual ROM. Pt was told he could take a small sip for effortful and for super-supraglottic PRN. Drops were suggested for Masako. Today, pt used his saliva for effortful and Masako, and took 1/4 teaspoon sips for super-supraglottic. Pt exhibited mild wet voice with 3 1/4 teaspoons of water using lean and super-supraglottic. SLP/pt agree once/month is satisfactory at this time, with likely decr to once every two months next session x1-2 visits then re-eval if/when necessary at a later date.   04/20/23: Pt is s/p vocal fold injection sx. 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 SLP assessed pt's POs with water today. Oral mech unchanged except voice improved. Today pt was provided 1/4-1/2 teaspoons H2O after stating he had completed oral care in the last 30 minutes. Pt with consistent throat clearing using head/torso lean to rt for boluses x4. Throat clearing subsided after 4 1/2 minutes. SLP consulted Dr. Dr. Soldatova and she thought pt would be cleared to have this size bolus for practice (to minimize disuse atrophy) at home until FEES 05/11/23. SLP instructed pt to complete oral POs with 1/4-1/2 teaspoon water boluses following thorough oral care, x2-3/day, and cease if overt s/sx aspiration PNA. He should also cont dysphagia exercises until next visit.   03/23/23: Noted Dr. Okey appt 03/16/23 and results above. Pt is no longer having POs other than one bottle of water/day, since 03/16/23, due to being extra careful about surgery date. SLP told pt he can do this but should cont with the HEP for swallowing once/day. HEP completed with independence.  Pt with consistent throat clear with liquids however is doing this for safety. Speech tx: Pt should cont HEP for speech and if he notes his speech  beginning to be more dysarthric during HEP, he should stop and wait 30 minutes to complete the HEP. Today he completed the HEP with specific difficulty articulating velar (k,g) and liquids (for example, s, sh, z). Pt with EGD balloon dilation and vocal fold injection sx 04/14/23 so next visit after that on 04/20/23.   02/20/23:  Swallow: Next session, pt can reduce HEP to x2/week. Pt currently having thin to nectar liquids. No overt s/sx aspiration PNA today. Pt with f/u with neurology (Dr. Valetta - [facility]) 03/12/23. He was independent with HEP. Is going to protrude tongue more with Masako from now on. With POs - pt had drinkable yogurt today with occasional throat clearing, pt stated due mostly to feeling of residue. SLP postulates pt with more frequent s/sx velopharyngeal incompetence during swallowing. Pt admitted to taking larger sips. SLP encouraged smaller sips.  Pt states his speech is more slurred after talking. SLP questions some neurological basis worsening pt's overall swallowing and speech ability. Follow up with neurologist 03/12/23. Pt and SLP agree pt could return in 4 weeks instead of 2. Speech: SLP discussed and educated pt and wife re: speech compensations, and provided a handout of articulatory HEP and reviewed with pt and wife. Pt to begin this asap for speech compensations.   01/23/23: Pt saw Dr. Soldatova 01/19/23: Assessment/plan above under Objective findings. SLP discussed that pt should perform HEP until mid-October. No plan to have follow up MBS at this time due to pt performance in ST sessions relatively unchanged in last 2-3 sessions. With POs (wet puree and water), wet voice intermittently, pt using safe swallow compensations with success. No overt s/sx aspiration PNA at this time. SLP told pt to cont with current solid  consistency - said could have puree soups (homogenous consistency) and broths - teaspoons.  Pt completed HEP with independence. Pt agrees with SLP his condition is currently stable and will cont to be seen every other week for two months. SLP questions if pt's swallowing will improve with improved velopharyngeal closure, and the role possible glottic insufficiency may be playing in efficacy/efficiency of pt's swallow.  01/16/23: Pt to see Dr. Soldatova 01/19/23. Pt performed oral care prior to ST today. SLP plan is to have pt perform dysphagia HEP for approx 10 weeks prior to MBS (which will be end of September). Pt appears to cont to gain swallow strength week after week. With POs, >75% of the time Hager had audible swallow, possibly insufficient velopharyngeal closure, as pt again (as in previous session) sniffed after swallowing water. To this point, pt has remained free of overt s/sx aspiration PNA and has followed safe swallow strategies in sessions, sometimes with rare initial reminder to cough/clear throat after 2nd or 3rd swallow with each bolus. Wet voice heard approx 50% of the time with yogurt and water, mostly cleared with multiple swallows. 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  Pt was independent with HEP.   01/08/23: SLP  educated pt/wife rationale for waiting to schedule MBS/FEES until mid-September or later will give Rishab ample time for strength gain in swallowing musculature (at least 8 weeks). No overt s/sx aspiration PNA this session; pt ate small spoons of baby food yams, and drank water. SLP had to provide initial cue to cough after a bite of yams (pt was inconsistent) but then Lavina cont'd with consistent coughing after last swallow of bolus. PT to cont with solid POs and liquid POs at  home. Appointment with Dr. Soldatova is 01/19/23. Min A with HEP for tongue protrusion on Masako. Will keep pt once/week to monitor for s/sx PNA and to  possibly upgrade pt to regular pureed (instead of baby food) after assessment in-session.  01/01/23: Pt reports difficult to get trigger on first 1-3 reps of Masako and supraglottic but once he passes these number of reps - no difficulty with triggering swallow. No problem at all triggering effortful swallow from rep #1 on. Today pt did not have difficulty with triggering swallows with Masako. Although lingual protrusion was adequate, SLP told pt to protrude tongue a bit further for the first 2 reps and he could do so. SLP told pt protruding tongue to that length was preferable to the length he currently uses, so he should try to have more reps with further protrusion. Last session, SLP added dynamic Shaker to pt's regimen, 30 1-second reps at least BID. With POs, pt made through one 4 oz jar in one week. SLP suggested pt use drinkable yogurt, Fair life milk, or Boost as POs as well, as long as pt is using safe swallow techniques from MBS 10/30/22. Pt req'd cues for a cough after every bite/sip, and SLP provided rationale and reminder that pt's sensation was not  100% when material was penetrated.  12/26/22: No s/sx any aspiration PNA today, nor in this last week at all. Pt cont to perform PO trials with water at home, but coughs with every 3-4th swallow. SLP strongly urged pt to complete cough every swallow based upon MBS results. Pt followed other precautions with independence. SLP trialed baby food applesauce with pt with liquid wash and completed with success. SLP told pt to try 10 boluses as many times/day as desired but always stopping if he feels he has less than 8/10 stamina (10=WNL), and always perform thorough oral care before and after POs. SLP also suggested pt could try baby food applesauce-consistency pudding or yogurt. Pt completed HEP today independently. Due to initiating puree today pt will keep frequency at once/week.   12/19/22: No s/sx any aspiration PNA today, nor in this last week at all.  Performed oral care just prior to appointment today. Pt independent with HEP today. Protruded tongue past lips independently. Pt cont to take one bottle of water per day, multiple (x2-3) sips per bolus with multiple hard swallows and cough if necessary. Instead, SLP told pt to take one sip with two-three hard swallows, cough, reswallow hard. SLP to attempt smooth purees with pt next session -will have been 4 weeks since initiation of HEP and pt without any overt s/sx aspiration PNA at this time.  12/12/22: Pt performed oral care prior to ST. Long discussion about whether or not BaSwallow exam would be safe for pt at this time. Afterwards, SLP emailed SLP who performed MBS with pt and she stated BaSwallow exam might be more difficult to perform safely at this time. Pt performed HEP independently and used safe swallow techniques with sips of water.   12/05/22: Pt independent with HEP today, will use mirror for Masako until extent of tongue protrusion habituated. Pt demo'd strategies with sip water today, independently. Pt and SLP agree pt can reduce to once/week. SLP provided s/sx aspiration PNA and pt with mod I.  12/03/22: Pt has been performing HEP regularly. Marylene he performed exercises with initial mod cue for Masako (tongue protrusion).  He drank water without overt s/sx aspiration x3 using safe swallow strategies independently.  11/24/22:  Today SLP  explained results of pt's MBS and the rationale for the safe swallow strategies. Pt demonstrated understanding of each and reports he has been using these with one bottle of water each day. No overt s/sx of aspiration PNA to date with >1 week of PO water. Pt has NOT been performing oral care prior to POs. SLP educated pt re: swallow HEP procedure. Pt performed each exercise and SLP ensured correctness prior to moving to next exercise. Pt req'd min A with strength of cough with supraglottic.  PATIENT EDUCATION: Education details: see today's  treatment Person educated: Patient and Spouse Education method: Explanation and Demonstration Education comprehension: verbalized understanding, returned demonstration, and needs further education   ASSESSMENT:  CLINICAL IMPRESSION: SLP modified pt's last LTG to match likely plan of care until d/c. Patient is a 63 y.o. M who was seen today for treatment of swallowing and speech. Pt is now s/p balloon dilation, vocal fold injection, and ear tube placement. He had FEES on 05/11/23 with results above. Please see today's treatment for more details. He cont to not desire formal ST for speech intelligibility at this time. Likely decr to once every two months after next visit if all remains at least unchanged from today.    OBJECTIVE IMPAIRMENTS: include dysarthria and dysphagia. These impairments are limiting patient from ADLs/IADLs, effectively communicating at home and in community, and safety when swallowing. Factors affecting potential to achieve goals and functional outcome are co-morbidities and severity of impairments. Patient will benefit from skilled SLP services to address above impairments and improve overall function.  REHAB POTENTIAL: Fair given prognosis from MBS   GOALS: Goals reviewed with patient? Yes  SHORT TERM GOALS: Target date: 12/25/22  Pt will perform HEP with rare min A in two sessions Baseline:12/03/22 Goal status: Met  2.  Pt will follow prescribed safe swallow strategies in 3 sessions Baseline: 12/03/22, 12/12/22 Goal status: Partially met  3.  Pt will perform HEP 6/7 days a week for two weeks, per HEP monitoring method Baseline:  Goal status: MET  4.  Pt and/or wife will ID 3 overt s/sx aspiration PNA with modified independence Baseline:  Goal status: met   LONG TERM GOALS: Target date: 01/30/23; 05/21/23  Pt will perform HEP with rare min A in two sessions Baseline: 12/19/22 Goal status: Met  2.  Pt will follow prescribed safe swallow strategies in 3  sessions, after 12/25/22 Baseline: 01/08/23, 01/16/23 Goal status: met  3.  Pt will undergo follow up MBS when/if clinically indicated Baseline:  Goal status: Partially met (FEES)  4.  Pt will score higher on PROM than initial score Baseline:  Goal status: DEFERRED, and ongoing  5.   Pt will perform HEP independently in three sessions Baseline: 01/16/23, 01/23/23 Goal status: Met  6.   Pt will remain free of overt s/sx aspiration PNA over five sessions Baseline: 02/20/23, 04/20/23, 05/18/23 Goal Status: Partially met, and ongoing    PLAN:  SLP FREQUENCY: 1x/month  SLP DURATION: other: 90 days  PLANNED INTERVENTIONS: Aspiration precaution training, Pharyngeal strengthening exercises, Diet toleration management , Environmental controls, Trials of upgraded texture/liquids, Cueing hierachy, Internal/external aids, SLP instruction and feedback, Compensatory strategies, Patient/family education, and Repeat MBS PRN    Steph Cheadle, CCC-SLP 05/18/2023, 4:24 PM

## 2023-05-19 ENCOUNTER — Encounter: Payer: Self-pay | Admitting: Hematology and Oncology

## 2023-05-25 ENCOUNTER — Ambulatory Visit (INDEPENDENT_AMBULATORY_CARE_PROVIDER_SITE_OTHER): Payer: BC Managed Care – PPO | Admitting: Otolaryngology

## 2023-05-25 ENCOUNTER — Encounter: Payer: Self-pay | Admitting: Hematology and Oncology

## 2023-05-25 ENCOUNTER — Encounter (INDEPENDENT_AMBULATORY_CARE_PROVIDER_SITE_OTHER): Payer: BC Managed Care – PPO | Admitting: Otolaryngology

## 2023-05-25 ENCOUNTER — Encounter (INDEPENDENT_AMBULATORY_CARE_PROVIDER_SITE_OTHER): Payer: Self-pay | Admitting: Otolaryngology

## 2023-05-25 VITALS — BP 135/81 | HR 70

## 2023-05-25 DIAGNOSIS — H6991 Unspecified Eustachian tube disorder, right ear: Secondary | ICD-10-CM | POA: Diagnosis not present

## 2023-05-25 DIAGNOSIS — K1379 Other lesions of oral mucosa: Secondary | ICD-10-CM

## 2023-05-25 DIAGNOSIS — Z85818 Personal history of malignant neoplasm of other sites of lip, oral cavity, and pharynx: Secondary | ICD-10-CM | POA: Diagnosis not present

## 2023-05-25 DIAGNOSIS — R1312 Dysphagia, oropharyngeal phase: Secondary | ICD-10-CM

## 2023-05-25 DIAGNOSIS — Z85819 Personal history of malignant neoplasm of unspecified site of lip, oral cavity, and pharynx: Secondary | ICD-10-CM

## 2023-05-25 DIAGNOSIS — Z923 Personal history of irradiation: Secondary | ICD-10-CM

## 2023-05-25 DIAGNOSIS — R1310 Dysphagia, unspecified: Secondary | ICD-10-CM | POA: Diagnosis not present

## 2023-05-25 DIAGNOSIS — K148 Other diseases of tongue: Secondary | ICD-10-CM

## 2023-05-25 DIAGNOSIS — J3801 Paralysis of vocal cords and larynx, unilateral: Secondary | ICD-10-CM

## 2023-05-25 DIAGNOSIS — R1319 Other dysphagia: Secondary | ICD-10-CM | POA: Diagnosis not present

## 2023-05-25 DIAGNOSIS — T17908A Unspecified foreign body in respiratory tract, part unspecified causing other injury, initial encounter: Secondary | ICD-10-CM

## 2023-05-25 DIAGNOSIS — Y842 Radiological procedure and radiotherapy as the cause of abnormal reaction of the patient, or of later complication, without mention of misadventure at the time of the procedure: Secondary | ICD-10-CM | POA: Diagnosis not present

## 2023-05-25 DIAGNOSIS — R634 Abnormal weight loss: Secondary | ICD-10-CM | POA: Diagnosis not present

## 2023-05-25 NOTE — Progress Notes (Signed)
 ENT Progress Note:  Update 05/25/23  He returns for f/u. Had FEES, which unfortunately showed aspiration and penetration. He is doing swallow therapy with SLP, doing water by mouth, rest of nutrition via PEG. No recent PNA. No fevers/chills.   Last visit 04/23/23  History of Present Illness   Here for f/u after DML b/l injection augmentation EGD and balloon dilation to 21 mm at the UES/PES level and right ear tube placement. Developed stridor and throat tightness POD#2 came in to ED, all sx resolved after steroids IV in ED. He reports significant improvement in hearing and reduction in ear pressure following the placement of a tube in the right ear. However, he now experiences similar symptoms in the left ear, and would like a tube on the left side as well.  The patient has been managing his symptoms with a feeding tube due to difficulties with swallowing. Despite the absence of strictures or esophageal issues on EGD, the patient has been unable to consume food orally. The primary concern appears to be related to the base of the tongue and throat based on his swallow study and additional workup.   In addition to swallowing difficulties, the patient reports persistent nasal drainage. Despite the use of Flonase , the drainage continues, possibly due to incomplete palate closure (has hx of VPI)  The patient also has a history of acid reflux, managed with Protonix  and Famotidine .    Update 03/16/23: His Neurology f/u is scheduled and currently pending. He drinks shakes for pleasure, but still relies on PEG for nutrition and takes medications via PEG. He has had sensation of decreased hearing on the right side and some ear discomfort. No fevers, no admissions to the hospital.    Initial Evaluation 01/19/23 Reason for Consult: dysphagia hx of oropharyngeal cancer and cXRT  HPI: Todd [name] is an 63 y.o. male with hx of oropharyngeal SCCa stage IVB (T3N3M0) diagnosed 2017, s/p primary cXRT who is here  for evaluation of severe dysphagia.  He was referred by Dr. Izell and his current SLP, due to significant decline in swallowing function approximately 1 year ago and the need to rely on PEG for nutrition support secondary to four episodes of aspiration pneumonia.  He was previously diagnosed and followed by Dr. Carlie ENT, who diagnosed his left oropharyngeal cancer, tonsil being primary site. He reports that approximately a year ago he started to have significant issues with his speech and swallowing.  Following primary chemoradiation for oropharyngeal cancer he was able to eat everything without problems, and his speech was normal.  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 Last spring approximately in April and May of 2024, he began to develop chills and fevers at home.  Oropharyngeal cancer recurrence was considered and he had PET/CT, neck MRI and chest CT. there was no evidence of disease recurrence however imaging demonstrated bilateral pneumonia and borderline enlarged mediastinal nodes.  Since then he had 4 episodes of pneumonia which was thought to be due to aspiration.  He was seen by pulmonary Dr. Meade in May 2024.  Record review indicates that Dr. Meade advised him to see his speech therapist, and stated in his note from May of 17th at the most likely cause of bilateral pneumonia is aspiration.  Plan comes to his swallowing, initially he noticed that he began to choke on both liquids and solids approximately a year ago.  He would have mucus and productive cough.  Denies dyspnea with PNA and would get chills and fevers. Then he would develop productive cough.  After repeat modified barium swallow in June, due to concern for recurrent aspiration pneumonia and significant weight loss, he underwent PEG placement.  He is consuming liquids such as water coffee and orange juice by mouth but supplements calories with boost through the PEG.  He  also has a history of cervical myelopathy with bilateral numbness and tingling in his hands.  Had 2 neurosurgical consultations, locally and at [facility], and due to history of radiation was advised not to undergo procedure because the risks would be significant if ACDF was attempted.  Seeing neurology currently, although unclear what was the initial consult reason. Being f/b Neurology Dr Juliene Moores, at [facility], and has a scheduled appt 03/02/23     Records Reviewed: Multiple office notes reviewed most pertinent summarized below and in HPI MBS 10/30/2022 Clinical Impression: Clinical Impression: Patient presents with mild oral and severe pharyngo-cervical esophageal dysphagia presumed due to iatrogenic effects of XRT that is significantly worse compared to prior MBS conducted by this SLP in 2018.   SLP reviewed note from MBS at [facility] in 2023/11 but could not view images. 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  Head tilt to the right attempted to aid oral transiting/control with partial effectiveness.  Pharyngeal swallow marked by severe hypomotility resulting in minimal transiting of barium into esophagus and laryngeal penetration/aspiration. Mulitple dry swallows *pt initiated* is helpful to decrease amount of retention but does not fuly clear it, even after 5-6 swallows with small single liquid bolus.  Head of chair reclined to approx 45* with slight neck extension did decrease amount of penetration and aspiration as gravity faciliated retention of barium in posterior pharynx.  Even in posterior position and with use of right tongue for oral transiting, pt penetrated very small bite of masticated cracker after the swallow as pharyngeal retention spilled into open airway.  Cued cough effectively cleared cracker into pharynx for pt to swallow with liquids.  Clearance into cervical esophagus was  significantly impaired with gross retention of liquids as pyriform sinus.         At this time, pt is very high aspiration and malnutrition, dehydration risk with his current level of dysphagia.  Note plan is for PEG next week *11/07/2022  per pt and SLP agrees this is warranted.  SLP reviewed MBS extensively with pt and reinforced most effective compensation strategies.     Encouraged pt to continue po with strict adherence to compensations including head of bed/chair reclined, chin elevated, multiple swallows per bolus, cough/throat clear and expectorate prn.      Question if he may benefit from intervention for PES opening to mitigate his dysphagia - advised he speak to Dr Carlie, ENT, re: potential.     Unfortunately [facility] current dysphagia is profound and given progressive deficit (presumed from XRT effects, fibrosis) for 15 months, prognosis for swallow to return to functional level is guarded at best.    Advised pt consider ordering antichoking device for emergent use.  Thanks so much for this consult.   Factors that may increase risk of adverse event in presence of aspiration Noe & Lianne 2021): Factors that may increase risk of adverse event in presence of aspiration Noe & Lianne 2021): Reduced saliva    Past Medical History:  Diagnosis Date   Acquired hypothyroidism    secondary to  radiation //   followed by pcp   Arthritis    shoulders, neck, knees, feet   Bilateral sensorineural hearing loss    due to radiation   Decreased range of motion of neck    due to fibrotic changes of 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   Denervation atrophy of muscle    tongue ,  chronic ,  due to radiation injury   (neurologist-- dr a. rawland ([facility] [place])  EMG in care everywhere done 03-11-2022   Frequency of urination    GERD (gastroesophageal reflux disease)    History of cancer chemotherapy 2017   tonsil cancer   03-24-2016  to 05-06-2016   History of external beam radiation  therapy    03-24-2016  to 05-15-2016  Left Tonsil and Bilateral Neck  @ [facility]   HLD (hyperlipidemia)    Hoarseness, chronic    due to radiation   Hyperlipidemia, mixed    Hypertension    pt states he has not had HTN since June 2024 (he has not taken BP meds since then since his BP has been on the lower side)   Inguinal hernia, left    Lower facial weakness    intermittant left lower facial weakness   Numbness and tingling in left arm    intermittently per pt   Oropharyngeal dysphagia    hx neck radiation for left tonsil cancer ;  Speech therapy, regular diet with precautions swallows okay   Orthostatic hypotension    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   followed by ENT dr bates/  oncologist--- dr lonn;  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;  completed chemo 05-06-2016 and completed radiation of left tonsil/ bilateral neck 05-15-2016  w/ radiation injury to tongue (denervation chronic)   Wears glasses    Wears hearing aid in both ears     Past Surgical History:  Procedure Laterality Date   INGUINAL HERNIA REPAIR Left 05/02/2022   Procedure: OPEN LEFT INGUINAL HERNIA REPAIR WITH MESH;  Surgeon: Signe Mitzie LABOR, MD;  Location: [facility] [place];  Service: General;  Laterality: Left;   IR GASTROSTOMY TUBE MOD SED  11/07/2022   IR GASTROSTOMY TUBE REMOVAL  01/30/2017   IR GENERIC HISTORICAL  03/20/2016   IR US  GUIDE VASC ACCESS RIGHT 03/20/2016 Norleen Roulette, MD [facility]   IR GENERIC HISTORICAL  03/20/2016   IR FLUORO GUIDE PORT INSERTION RIGHT 03/20/2016 Norleen Roulette, MD [facility]   IR GENERIC HISTORICAL  03/20/2016   IR GASTROSTOMY TUBE MOD SED 03/20/2016 Norleen Roulette, MD [facility]   IR REMOVAL TUN ACCESS W/ PORT W/O FL MOD SED  01/30/2017   IR REPLC GASTRO/COLONIC TUBE PERCUT W/FLUORO  04/30/2023   LARYNGOSCOPY WITH BRONCHOSCOPY AND BALLOON DILATION N/A 04/14/2023   Procedure: LARYNGOSCOPY WITH BRONCHOSCOPY AND BALLOON DILATION;  Surgeon: Okey Burns,  MD;  Location: [facility];  Service: ENT;  Laterality: N/A;   MICROLARYNGOSCOPY W/VOCAL CORD INJECTION Bilateral 04/14/2023   Procedure: MICROLARYNGOSCOPY WITH VOCAL CORD INJECTION;  Surgeon: Okey Burns, MD;  Location: [facility] OR;  Service: ENT;  Laterality: Bilateral;   MYRINGOTOMY WITH TUBE PLACEMENT Right 04/14/2023   Procedure: MYRINGOTOMY WITH TUBE PLACEMENT;  Surgeon: Okey Burns, MD;  Location: [facility] OR;  Service: ENT;  Laterality: Right;   RIGID BRONCHOSCOPY N/A 04/14/2023   Procedure: RIGID BRONCHOSCOPY;  Surgeon: Okey Burns, MD;  Location: [facility] OR;  Service: ENT;  Laterality: N/A;    Family History  Problem Relation Age of Onset  Dementia Father    Diabetes Father    Cancer Maternal Grandmother        unknown ca   Asthma Son     Social History:  reports that he has never smoked. He has never used smokeless tobacco. He reports that he does not currently use alcohol . He reports that he does not use drugs.  Allergies:  Allergies  Allergen Reactions   Phenergan  [Promethazine  Hcl]     Got really nervous and shaky..   Bactrim [Sulfamethoxazole-Trimethoprim] Other (See Comments)    Flu like symptoms    Tamsulosin Other (See Comments)    Caused orthostatic hypotension per pt    Medications: I have reviewed the patient's current medications.  The PMH, PSH, Medications, Allergies, and SH were reviewed and updated.  ROS: Constitutional: Negative for fever, weight loss and weight gain. Cardiovascular: Negative for chest pain and dyspnea on exertion. Respiratory: Is not experiencing shortness of breath at rest. Gastrointestinal: Negative for nausea and vomiting. Neurological: Negative for headaches. Psychiatric: The patient is not nervous/anxious  Blood pressure 135/81, pulse 70, SpO2 96%.  PHYSICAL EXAM:  Exam: General: Well-developed, well-nourished Communication and Voice: Raspy Respiratory Respiratory effort: Equal inspiration and expiration without  stridor Cardiovascular Peripheral Vascular: Warm extremities with equal color/perfusion Eyes: No nystagmus with equal extraocular motion bilaterally Neuro/Psych/Balance: Patient oriented to person, place, and time; Appropriate mood and affect; Gait is intact with no imbalance; 4 Cranial nerves: Previously seen tongue fasciculations but less pronounced today, left cranial nerve XII deficit, reduced soft palate movement on the left > right side likely due to fibrosis, VPI Head and Face Inspection: Normocephalic and atraumatic without mass or lesion Facial Strength: Facial motility symmetric and full bilaterally ENT Pinna: External ear intact and fully developed External canal: Canal is patent with intact skin Tympanic Membrane: right TM with T-tube, in good position and patent, no drainage, L TM normal no obvious air fluid level  External Nose: No scar or anatomic deformity Internal Nose: Septum is deviated to the left. No polyp, or purulence. Mucosal edema and erythema present.  Bilateral inferior turbinate hypertrophy.  Lips, Teeth, and gums: Mucosa and teeth intact and viable TMJ: No pain to palpation with full mobility Oral cavity/oropharynx: No erythema or exudate, no lesions present Nasopharynx: No mass or lesion with intact mucosa.  Thick yellow mucus and crusting along the nasopharynx. 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  Subglottic Space: Patent without lesion or edema Neck Neck and Trachea: Midline trachea without mass or lesion post XRT changes along left neck Thyroid : No mass or  nodularity Lymphatics: No lymphadenopathy  Procedure done on 04/23/23:  Preoperative diagnosis: hoarseness L VF paralysis and dysphagia aspiration s/p b/l VF injection augmentation   Postoperative diagnosis:   same  Procedure: Flexible fiberoptic laryngoscopy with stroboscopy (68420)  Surgeon: Elena Larry, MD  Anesthesia: Topical lidocaine  and Afrin  Complications: None  Condition is stable throughout exam  Indications and consent:   The patient presents to the clinic with hoarseness. All the risks, benefits, and potential complications were reviewed with the patient preoperatively and informed verbal consent was obtained.  Procedure: The patient was seated upright in the exam chair.  Topical lidocaine  and Afrin were applied to the nasal cavity. After adequate anesthesia had occurred, the flexible telescope was passed into the nasal cavity. The nasopharynx was patent without mass or lesion. The scope was passed behind the soft palate and directed toward the base of tongue. The base of tongue was visualized and was symmetric with no apparent masses or abnormal appearing tissue. There were no signs of a mass or pooling of secretions in the piriform sinuses. The supraglottic structures were normal.  There was evidence of left VF paralysis. The medial edges were straight. Closure was complete. Periodicity was present. The mucosal wave and amplitude were intact. There is moderate interarytenoid pachydermia and post cricoid edema. The mucosa appears without lesions. There was thick yellow mucus along the nasopharynx/supraglottic structures/hypopharynx   The laryngoscope was then slowly withdrawn and the patient tolerated the procedure well. There were no complications or blood loss.    Studies Reviewed:CT chest 09/24/22 IMPRESSION: 1. Progressive diffuse peribronchovascular ground-glass, nodularity and consolidation with scattered septal thickening, findings which may be due to a  worsening atypical/fungal infectious process. Lymphangitic carcinomatosis is not excluded. 2. Borderline enlarged mediastinal lymph nodes, possibly reactive. Recommend continued attention on follow-up as malignancy cannot be excluded. 3. Coronary artery calcification  MRI neck 09/15/22 MRI OF THE NECK WITH CONTRAST   TECHNIQUE: Multiplanar, multisequence MR imaging was performed following the administration of intravenous contrast.   CONTRAST:  7mL GADAVIST  GADOBUTROL  1 MMOL/ML IV SOLN   COMPARISON:  PET-CT 09/04/2022, CT neck 03/13/2021   FINDINGS: Pharynx and larynx: The nasal cavity and nasopharynx are unremarkable.   There is no abnormality in the tongue to correspond to the area of ill-defined radiotracer uptake seen on the recent PET-CT. There is no mass lesion or abnormal enhancement. There is no evidence of mass lesion or abnormal enhancement in the region of either tonsil. The oropharynx is unremarkable. Ill-defined T2 hyperintensity in the left parapharyngeal space with mild non masslike enhancement likely reflects posttreatment change, corresponding to ill-defined stranding seen on the CT neck going back to 2019.   The hypopharynx and larynx are unremarkable. The vocal folds are normal in appearance   There is no retropharyngeal fluid collection.  The airway is patent.   Salivary glands: The parotid and submandibular glands are unremarkable.   Thyroid : Unremarkable.   Lymph nodes: There is no pathologic lymphadenopathy in the neck.   Vascular: The major flow voids are normal.   Limited intracranial: Unremarkable.   Visualized orbits: Unremarkable.   Mastoids and visualized paranasal sinuses: There is mucosal thickening in the maxillary sinuses, left worse than right. There is a new right mastoid effusion.   Skeleton: There is multilevel degenerative change of the cervical spine with disc space narrowing most advanced at C4-C5. There is moderate spinal  canal stenosis at C2-C3 through C4-C5.   Upper chest: The imaged lung apices are grossly unremarkable.   Other: None.   IMPRESSION: 1. No evidence of recurrent disease or pathologic lymphadenopathy in the neck. Specifically, there is no suspicious finding in the tongue to correspond to the focus of ill-defined radiotracer uptake on the recent PET-CT. NIRADS 1. 2. Right mastoid effusion, nonspecific.  4//25/24 PET/CT  IMPRESSION: 1. Asymmetric focus of increased radiotracer uptake is identified within the left posterior tongue. Etiology indeterminate. Cannot exclude recurrent tumor. Consider further evaluation with direct visualization and/or contrast enhanced soft tissue neck CT or MRI. 2. No tracer avid cervical lymph nodes. 3. Heterogeneous, multifocal asymmetric increased uptake is identified within the  left lower lobe. On the corresponding nondiagnostic CT images there is extensive tree-in-bud nodularity, thickening of the peribronchovascular interstitium, mucoid impaction, and atelectasis/airspace consolidation. Imaging findings are more likely to reflect a inflammatory/infectious process. Suspect chronic recurrent aspiration. Advise clinical correlation for signs/symptoms aspiration. Additionally, follow-up imaging with diagnostic CT of the chest in 3 months is recommended to ensure resolution and to exclude a central obstructing lesion which could also account for these changes. 4. Mildly tracer avid lymph nodes within the mediastinum and left hilum. In the setting of inflammation/infection these are likely reactive. Metastatic adenopathy is considered less favored but cannot be excluded with a high degree of certainty in this patient who has a history of known malignancy. Attention to these lymph nodes on follow-up imaging is advised. 5. Diffuse tree-in-bud nodularity is also identified within the basilar right upper lobe, right middle lobe and right lower lobe. This is  compatible with an inflammatory/infectious bronchiolitis and may be seen with recurrent aspiration. 6. Coronary artery calcifications. 7. Prostate gland enlargement. 8. Asymmetric opacification of the left maxillary sinus. Correlate for any clinical signs or symptoms of sinusitis.   CT neck 04/06/23 reviewed - paranasal sinuses appear clear  CT neck and chest 04/06/23 - no evidence of disease recurrence  IMPRESSION: 1. No CT evidence of local recurrence or metastatic disease in the neck. 2. Chronic radiation related changes on the left. No adenopathy in the neck. 3. 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 This has been seen previously. 4. Cardiomegaly with LAD coronary artery calcification. 5. Chronic subpleural fibrosis in the anterior lung apices probably relates to XRT change. 6. Since last CT, interval clearing of the upper and right middle lobes, and significant improvement in the lower lobes, with resolution of tree-in-bud and consolidative change on the left and mild patchy residual tree-in-bud changes in the right lower lobe consistent with residual pneumonitis/small airways disease or microaspiration. 7. Thickened bands of consolidation in the posterior lower lobes, probably atelectatic, alternatively could be due to small areas of pneumonia. 8. New 6 mm subpleural right lower lobe nodule and 4 mm pleural-based nodule in the apex of the superior segment of the right lower lobe. Attention on follow-up scan recommended. 9. PEG tube in the body of the stomach. 10. Cholelithiasis. 11. Hepatic steatosis with stable chronic hypodensities in segment  FEES 05/11/23 Pt appears to have improved adduction of his vocal folds, with some movement noted on his L side. Pt says his glottis was wide open but his vocal folds appear to approximate especially  on anterior end. When cued to hold his breath and bear down, he approximates his fasle vocal folds, completely obstructing view of his true vocal folds.    Oral phase is not directly observed during FEES, but pt reports limited lingual transit as consistencies get thicker. He needed to use liquid washes to try to swallow a small amount of puree.   Pt's pharyngeal phase of swallowing is still heavily impacted by generalized hypomobility, and aspiration still occurs across consistencies. Although likely similar to MBS in June in many regards, suspect that his improved ability to adduct his cords may help some with expectoration. A combination of a reclined position and a super supraglottic swallow maneuver helps to increase airway protection during the swallow. Residue (especially in the pyriform sinuses) spills over the arytenoids and into his airway after the swallow as well. He has similar  amounts of residue given thin and nectar thick liquids, and he is able to cough some of the penetrated/aspirated material back out of his airway. Aspiration is not fully cleared, as he does have some productive coughing throughout the study that has evidence of the green food coloring used for this study. He seems to have increased difficulty clearing his airway and his pharynx with honey thick liquids and purees.    When allowed to self-regulate intake with thin liquids while using recommended strategies, he does seem to clear a lot (but not all) from his airway. He clears more this way than he does when taking single ice chips. Discussed results with pt and wife, who was able to watch this study as well. Would continue to at least consume water after oral care as he has been doing, along with HEP given by OP SLP. Encouraged him to try to start encorporating the super supraglottic swallow as well. We discussed the potential to advance to some other liquids outside of just water at the discretion of his primary team (SLP,  ENT, etc.), maybe as he starts to feel back to his baseline from recent medical events. Education was provided on risk factors, outside of aspiration, that impact the risk for adverse events to occur and trying to optimize other areas (oral care, physical mobility, GI and pulmonary status, etc.) given known aspiration.  Assessment/Plan: Encounter Diagnoses  Name Primary?   Velopharyngeal insufficiency, acquired Yes   Oropharyngeal dysphagia    Vocal fold paralysis, left    Dysphagia, unspecified type    Dysfunction of right eustachian tube    Paralysis of left side of tongue    History of oropharyngeal cancer    History of radiation to head and neck region    Aspiration into airway, initial encounter      63 year old male with history of cXRT for stage IVb left oropharyngeal cancer in 2017, who is here for initial consultation with me due to severe dysphagia primarily in oropharynx and dysarthria for approximately 1 year and recurrent aspiration pneumonia.  Had total of 4 aspiration pneumonia episodes and was seen by pulmonary with recommendation to follow with SLP, and limit antibiotic use.  Repeat MBS in June 2024 with evidence of pharyngeal dysphagia and aspiration.  Underwent PEG placement, and working with speech, liquid only intake by mouth and boost supplementation via PEG.  He reports significant decline in speech and swallowing about a year ago, and prior to that had normal speech and was tolerating regular diet without issues.   On my exam today I was able to do evaluation of UES/upper esophagus and post-cricoid area in the office advancing flexible scope passed postcricoid area- no stricture seen.  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  There was significant pooling of secretions along the vallecula bilaterally.  There was evidence  of left cranial nerve XII deficit, and diffuse tongue fasciculations.   I discussed exam findings with the patient.  My main concern is due to acute worsening of both speech/tongue mobility and evidence of diffuse tongue fasciculations, neurologic condition such as ALS needs to be excluded.  Other neurologic processes such as those affecting bulbar function also have to be ruled out.  Patient's severe pharyngeal dysphagia could still be related to sequela of radiation, but acute changes in his swallowing function are certainly suspicious  for another cause.   I advised the patient to see his neurologist Dr Rawland at [facility] in October as scheduled and will communicate with his neurologist to ensure above concerns are addressed during his follow-up  Will consider procedural interventions in the future based on neurology evaluation and for further workup.  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  He will return after follow-up with his neurologist.  Will do videostrobe when he returns to determine if left vocal fold paralysis results in severe glottic insufficiency.   Update 04/23/23  S/p DML b/l injection augmentation EGD and balloon dilation to 21 mm at the UES/PES level and right ear tube placement 1 week ago. Developed stridor and throat tightness POD#2 came in to ED, all sx resolved after steroids IV in ED. He reports significant improvement in hearing and reduction in ear pressure following the placement of a tube in the right ear. However, he now experiences similar symptoms in the left ear, and would like a tube on the left side as well. Weight is stable. CT neck and chest with improvement of lung findings that were previously concerning for aspiration. Still on TEN via PEG.    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 Has FEES scheduled in 2 weeks and working with SLP - videostrobe today with correction of glottic gap post injection augmentation and evidence of thick yellow secretions in nasopharynx and along nasopharyngeal wall, likely 2/2 VPI and inability to blow his nose efficiently - Nasal saline rinses with NeilMed Bottle  - already given Augmentin  before without any change, will hold off on further abx since he did finish a course we gave him post-op recently along with steroids and burden of yellow secretions does appear to be reduced on exam today   2. Chronic nasal congestion and right sided middle ear effusion - nasal saline rinses with NeilMed - Flonase  2 puffs b/l nares BID  3. Chronic dysphagia and aspiration PEG/Dependent  -We discussed at length that a lot of his swallowing problems are due to postradiation side effects.   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  Eustachian tube dysfunction right side, recurrent issue per patient.  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 Left ear-  Normal to severe sensorineural hearing loss from 250 Hz - 8000 Hz - he would like to consider ear tube for left side, did not have fluid on exam today - will decide when he returns   5. 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 Able to consume water and pills by mouth, otherwise PEG dependent. Recent dilation procedure was ineffective. At risk of aspiration pneumonia. Discussed potential benefits and risks of introducing small amounts of other consistencies of liquids or foods, such as Ensure or lemon yogurt, to potentially strengthen swallowing muscles and improve quality of life. Emphasized importance of oral hygiene, regular exercise, and adherence to swallowing precautions to minimize aspiration risk. 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 We discussed that filler typically lasts 9-12 months. Reports improved breathing and cough strength. Discussed possibility of filler resorption over time and potential need for another injection if symptoms worsen. - Monitor for signs of filler resorption - Consider repeat vocal cord injection if symptoms worsen  Radiation-Induced Xerostomia Experiences throat dryness due to radiation therapy affecting salivary glands, contributing to difficulty in clearing secretions. Suggested steaming treatments or nebulizer with saline to help clear secretions. - Consider steaming treatments or nebulizer with salt water  Eustachian Tube Dysfunction Eustachian tube dysfunction improved following ear tube placement on the right side. Reports improved hearing and no longer needing to increase TV volume. Wears hearing aids and it seems to help - No further intervention needed at this time  General Health  Maintenance Emphasized importance of maintaining overall health through regular exercise and oral hygiene to reduce the risk of illness and aspiration pneumonia. - Maintain regular exercise - Maintain oral hygiene  Follow-up - Schedule follow-up appointment in six months - Return earlier if acute issues arise.     Elena Larry, MD Otolaryngology [facility] Phone: [phone] Fax: [phone]  05/25/2023, 4:21 PM

## 2023-06-08 ENCOUNTER — Encounter: Payer: Self-pay | Admitting: Cardiovascular Disease

## 2023-06-08 NOTE — Progress Notes (Unsigned)
  Cardiology Office Note:  .   Date:  06/09/2023  ID:  Todd [name], DOB 26-May-1960, MRN [id number] PCP: Gaspar Garbe, MD  [facility] Providers Cardiologist:  Junie Engram    History of Present Illness: Todd [name]   Todd [name] is a 63 y.o. 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  I suspect a lot of his symptoms are because of his inability to take in enough liquid and protein.  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         Risk Assessment/Calculations:             Physical Exam:    Physical Exam: Blood pressure 98/72, pulse 65, height 5\' 9"  (1.753 m), weight 165 lb 9.6 oz (75.1 kg), SpO2 93%.       GEN:  thin, chronically ill man,  in no acute distress HEENT: Normal NECK: No JVD; No carotid bruits LYMPHATICS: No lymphadenopathy CARDIAC: RRR , no murmurs, rubs, gallops RESPIRATORY:  Clear to auscultation without rales, wheezing or rhonchi  ABDOMEN: Soft, non-tender, non-distended MUSCULOSKELETAL:  No edema; No deformity  SKIN: Warm and dry NEUROLOGIC:  Alert and oriented x 3  ASSESSMENT AND PLAN: .      Orthostatic hypotension: Echocardiogram is essentially normal.  He has trivial tricuspid  regurgitation.  He is orthostatic hypotension is secondary to cranial neuropathy.  He is not on any cardiac meds.  Will see him on an as-needed basis.  2.  Question of dilated aorta: His echocardiogram showed what appears to be a mildly dilated aorta.  Confirmatory MR angiogram showed that there was no evidence of aortic dilatation.          Dispo:     Signed, Kristeen Miss, MD

## 2023-06-09 ENCOUNTER — Ambulatory Visit: Payer: BC Managed Care – PPO | Attending: Cardiovascular Disease | Admitting: Cardiovascular Disease

## 2023-06-09 ENCOUNTER — Encounter: Payer: Self-pay | Admitting: Cardiovascular Disease

## 2023-06-09 VITALS — BP 98/72 | HR 65 | Ht 69.0 in | Wt 165.6 lb

## 2023-06-09 DIAGNOSIS — I951 Orthostatic hypotension: Secondary | ICD-10-CM | POA: Diagnosis not present

## 2023-06-09 NOTE — Patient Instructions (Signed)
Medication Instructions:  Your physician recommends that you continue on your current medications as directed. Please refer to the Current Medication list given to you today.  *If you need a refill on your cardiac medications before your next appointment, please call your pharmacy*  Lab Work: If you have labs (blood work) drawn today and your tests are completely normal, you will receive your results only by: MyChart Message (if you have MyChart) OR A paper copy in the mail If you have any lab test that is abnormal or we need to change your treatment, we will call you to review the results.  Testing/Procedures: None ordered today.  Follow-Up: At [facility], you and your health needs are our priority.  As part of our continuing mission to provide you with exceptional heart care, we have created designated Provider Care Teams.  These Care Teams include your primary Cardiologist (physician) and Advanced Practice Providers (APPs -  Physician Assistants and Nurse Practitioners) who all work together to provide you with the care you need, when you need it.  We recommend signing up for the patient portal called "MyChart".  Sign up information is provided on this After Visit Summary.  MyChart is used to connect with patients for Virtual Visits (Telemedicine).  Patients are able to view lab/test results, encounter notes, upcoming appointments, etc.  Non-urgent messages can be sent to your provider as well.   To learn more about what you can do with MyChart, go to [email or web address].    Your next appointment:   As needed Other Instructions

## 2023-06-12 ENCOUNTER — Inpatient Hospital Stay: Payer: BC Managed Care – PPO | Attending: Hematology and Oncology | Admitting: Dietician

## 2023-06-12 NOTE — Progress Notes (Signed)
Nutrition Follow-up:  Patient completed concurrent chemoradiation for tonsil cancer. Final radiation 05/15/16 under the care of Dr. Basilio Cairo. Patient now with dysphagia and recurrent aspiration pneumonia. S/p DML injection and EGD with balloon dilation on 12/3 under the care of Dr. Irene Pap.    Tube exchange 12/19 FEES on 1/13 showed aspiration and penetration   Met with patient and wife in office. Patient shares disappointment that surgery was not more successful. Patient is grateful for the opportunity to try and will continue working SLP to strengthen swallow. He is drinking some water and taking medication orally. Patient continues to tolerate 5 cartons Boost VHC.    Medications: reviewed   Labs: 12/4 labs reviewed   Anthropometrics: last wt 165 lb 9.6 oz on 1/28 - trending up   12/5 - 154 lb 1.6 oz 11/26 - 159 lb 8 oz  10/28 - 156 lb 9.6 oz   Estimated Energy Needs  Kcals: 2576-2898 Protein: 103-116 Fluid: >2.2 L  NUTRITION DIAGNOSIS: Inadequate oral intake ongoing - pt meeting nutrition/hydration via PEG   MALNUTRITION DIAGNOSIS: Severe malnutrition improving   INTERVENTION:  Continue 5 cartons Boost VHC via PEG Fluids orally as tolerated  Pt will continue working with SLP  Support and encouragement     MONITORING, EVALUATION, GOAL: wt trends, TF   NEXT VISIT: Tuesday March 18 prior to MD visit

## 2023-06-16 ENCOUNTER — Ambulatory Visit: Payer: BC Managed Care – PPO | Attending: Radiation Oncology

## 2023-06-16 DIAGNOSIS — Z931 Gastrostomy status: Secondary | ICD-10-CM | POA: Diagnosis not present

## 2023-06-16 DIAGNOSIS — R634 Abnormal weight loss: Secondary | ICD-10-CM | POA: Diagnosis not present

## 2023-06-16 DIAGNOSIS — R471 Dysarthria and anarthria: Secondary | ICD-10-CM | POA: Insufficient documentation

## 2023-06-16 DIAGNOSIS — Y842 Radiological procedure and radiotherapy as the cause of abnormal reaction of the patient, or of later complication, without mention of misadventure at the time of the procedure: Secondary | ICD-10-CM | POA: Diagnosis not present

## 2023-06-16 DIAGNOSIS — R1312 Dysphagia, oropharyngeal phase: Secondary | ICD-10-CM | POA: Diagnosis not present

## 2023-06-16 DIAGNOSIS — Z85818 Personal history of malignant neoplasm of other sites of lip, oral cavity, and pharynx: Secondary | ICD-10-CM | POA: Diagnosis not present

## 2023-06-16 NOTE — Therapy (Signed)
 OUTPATIENT SPEECH LANGUAGE PATHOLOGY SWALLOW TREATMENT/RENEWAL-DISCHARGE   Patient Name: Todd [name] MRN: [id number] DOB:[birth year], 63 y.o., male Today's Date: 06/16/2023  PCP: Alanson Ade, MD REFERRING PROVIDER: Izell Domino, MD  END OF SESSION:  End of Session - 06/16/23 1115     Visit Number 15    Number of Visits 17    Date for SLP Re-Evaluation 06/16/23    SLP Start Time 1110    SLP Stop Time  1145    SLP Time Calculation (min) 35 min    Activity Tolerance Patient tolerated treatment well                     Past Medical History:  Diagnosis Date   Acquired hypothyroidism    secondary to radiation //   followed by pcp   Arthritis    shoulders, neck, knees, feet   Bilateral sensorineural hearing loss    due to radiation   Decreased range of motion of neck    due to fibrotic changes of 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   Denervation atrophy of muscle    tongue ,  chronic ,  due to radiation injury   (neurologist-- dr aSABRA moores ([facility])  EMG in care everywhere done 03-11-2022   Frequency of urination    GERD (gastroesophageal reflux disease)    History of cancer chemotherapy 2017   tonsil cancer   03-24-2016  to 05-06-2016   History of external beam radiation therapy    03-24-2016  to 05-15-2016  Left Tonsil and Bilateral Neck  @ [facility]   HLD (hyperlipidemia)    Hoarseness, chronic    due to radiation   Hyperlipidemia, mixed    Hypertension    pt states he has not had HTN since June 2024 (he has not taken BP meds since then since his BP has been on the lower side)   Inguinal hernia, left    Lower facial weakness    intermittant left lower facial weakness   Numbness and tingling in left arm    intermittently per pt   Oropharyngeal dysphagia    hx neck radiation for left tonsil cancer ;  Speech therapy, regular diet with precautions swallows okay   Orthostatic hypotension    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   followed by ENT dr  bates/  oncologist--- dr lonn;  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;  completed chemo 05-06-2016 and completed radiation of left tonsil/ bilateral neck 05-15-2016  w/ radiation injury to tongue (denervation chronic)   Wears glasses    Wears hearing aid in both ears    Past Surgical History:  Procedure Laterality Date   INGUINAL HERNIA REPAIR Left 05/02/2022   Procedure: OPEN LEFT INGUINAL HERNIA REPAIR WITH MESH;  Surgeon: Signe Mitzie LABOR, MD;  Location: [facility];  Service: General;  Laterality: Left;   IR GASTROSTOMY TUBE MOD SED  11/07/2022   IR GASTROSTOMY TUBE REMOVAL  01/30/2017   IR GENERIC HISTORICAL  03/20/2016   IR US  GUIDE VASC ACCESS RIGHT 03/20/2016 Norleen Roulette, MD [facility]   IR GENERIC HISTORICAL  03/20/2016   IR FLUORO GUIDE PORT INSERTION RIGHT 03/20/2016 Norleen Roulette, MD [facility]   IR GENERIC HISTORICAL  03/20/2016   IR GASTROSTOMY TUBE MOD SED 03/20/2016 Norleen Roulette, MD [facility]   IR REMOVAL TUN ACCESS W/ PORT W/O FL MOD SED  01/30/2017   IR REPLC GASTRO/COLONIC TUBE PERCUT W/FLUORO  04/30/2023   LARYNGOSCOPY WITH  BRONCHOSCOPY AND BALLOON DILATION N/A 04/14/2023   Procedure: LARYNGOSCOPY WITH BRONCHOSCOPY AND BALLOON DILATION;  Surgeon: Okey Burns, MD;  Location: [facility];  Service: ENT;  Laterality: N/A;   MICROLARYNGOSCOPY W/VOCAL CORD INJECTION Bilateral 04/14/2023   Procedure: MICROLARYNGOSCOPY WITH VOCAL CORD INJECTION;  Surgeon: Okey Burns, MD;  Location: [facility];  Service: ENT;  Laterality: Bilateral;   MYRINGOTOMY WITH TUBE PLACEMENT Right 04/14/2023   Procedure: MYRINGOTOMY WITH TUBE PLACEMENT;  Surgeon: Okey Burns, MD;  Location: [facility];  Service: ENT;  Laterality: Right;   RIGID BRONCHOSCOPY N/A 04/14/2023   Procedure: RIGID BRONCHOSCOPY;  Surgeon: Okey Burns, MD;  Location: [facility];  Service: ENT;  Laterality: N/A;   Patient Active Problem List   Diagnosis Date Noted   Orthostatic hypotension 06/09/2023   Acute  respiratory distress 04/17/2023   Stridor 04/16/2023   Vocal fold paralysis, left 04/16/2023   Vocal cord edema 04/15/2023   Aspiration into airway 04/14/2023   Age-related vocal fold atrophy 04/14/2023   Paralysis of left vocal fold 04/14/2023   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   Malignant neoplasm of tonsillar fossa (HCC) 01/07/2023   Pulmonary infiltrates 09/08/2022   Hypothyroidism    Right hip pain 12/06/2018   CKD (chronic kidney disease), stage III (HCC) 11/30/2017   Anemia, chronic disease 11/30/2017   Multiple lung nodules on CT 09/08/2016   Pharyngoesophageal dysphagia 06/13/2016   Pancytopenia, acquired (HCC) 05/26/2016   Deficiency anemia 05/26/2016   Splenic lesion 05/16/2016   Jaw pain 04/08/2016   Essential hypertension 03/18/2016   Weight loss 02/26/2016   Tonsil cancer (HCC) 02/21/2016    SPEECH THERAPY RENEWAL-DISCHARGE SUMMARY  Visits from Start of Care: 15  Current functional level related to goals / functional outcomes: See goals below. Pt will have LTGs enforced for this session and then will be d/c'd.  Pt is able to swallow thin liquids and nectar liquids without overt s/sx aspiration PNA over time. Pt can complete HEP with independence.   Remaining deficits: Oropharyngeal dysphagia   Education / Equipment: See therapy notes for more details.   Patient agrees to discharge. Patient goals were partially met. Patient is being discharged due to maximized rehab potential. .     ONSET DATE: 2018   REFERRING DIAG: C09.9 (ICD-10-CM) - Tonsil cancer   THERAPY DIAG:  Dysphagia, oropharyngeal phase  Dysarthria and anarthria  Rationale for Evaluation and Treatment: Rehabilitation  SUBJECTIVE:   SUBJECTIVE STATEMENT: Pt has not had any POs except water since last session.  Pt accompanied by: significant other  PERTINENT HISTORY: From Dr. Izell note May 2024: The patient returns today for follow-up after he approached me with new symptoms at our  head and neck annual survivorship celebration.  Mr. Mcphearson presents for follow up of radiation completed 05/15/16 to his Tonsillar fossa/oropharynx.  Recent PET and MRI below were reviewed at tumor board, NED.  See below. 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  He is also losing weight.  PAIN:  Are you having pain? Yes: NPRS scale: 3/10 Pain location: cervical neck down to mid-back Pain description: annoying pain Aggravating factors: certain movement Relieving factors: meds  FALLS: Has patient fallen  in last 6 months?  No  PATIENT GOALS: Swallow safely  OBJECTIVE: SOLDATOVA 04/16/23: Procedure: Flexible fiberoptic laryngoscopy Surgeon: Elena Larry, MD Anesthesia: Topical lidocaine  and Afrin Complications: None Condition is stable throughout exam   Indications and consent:  The patient presents to the clinic with Indirect laryngoscopy view was incomplete. Thus it was recommended that they undergo a flexible fiberoptic laryngoscopy. All of the risks, benefits, and potential complications were reviewed with the patient preoperatively and verbal informed consent was obtained.  Procedure: The patient was seated upright in the clinic. Topical lidocaine  and Afrin were applied to the nasal cavity. After adequate anesthesia had occurred, I then proceeded to pass the flexible telescope into the nasal cavity. The nasal cavity was patent without rhinorrhea or polyp. The nasopharynx was also patent without mass or lesion. The base of tongue was visualized and was normal. There were no signs of pooling of secretions in the  piriform sinuses. The true vocal folds had evidence of previously seen L VF paralysis and evidence of injection augmentation, with mild edema. Subglottis was patent. There were no signs of glottic or supraglottic mucosal lesion or mass. There was moderate interarytenoid pachydermia and post cricoid edema. The telescope was then slowly withdrawn and the patient tolerated the procedure throughout.  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  Had total of 4 aspiration pneumonia episodes and was seen by pulmonary with recommendation to follow with SLP, and limit antibiotic use.  Repeat MBS in June 2024 with evidence of pharyngeal dysphagia and aspiration.  Underwent PEG placement, and working with speech, liquid only intake by mouth and boost supplementation via PEG.  He reports significant decline in speech and swallowing about a year ago, and prior to that had normal speech and was tolerating regular diet without issues.  On my exam today I was able to do evaluation of UES/upper esophagus and post-cricoid area in the office advancing flexible scope passed postcricoid area- no stricture seen.  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  There was significant pooling of secretions along the vallecula bilaterally.  There was evidence of left cranial nerve XII deficit, and diffuse tongue fasciculations.  I discussed exam findings with the patient.  My main concern is due to acute worsening of both speech/tongue mobility and evidence of diffuse tongue fasciculations, neurologic condition such as ALS needs to be excluded.  Other  neurologic processes such as those affecting bulbar function also have to be ruled out.  Patient's severe pharyngeal dysphagia could still be related to sequela of radiation, but acute changes in his swallowing function are certainly suspicious for another cause.  I advised the patient to see his neurologist Dr Rawland at [facility] in October as scheduled and will communicate with his neurologist to ensure above concerns are addressed during his follow-up. Will consider procedural interventions in the future based on neurology evaluation and for further workup.  Specifically will consider management of VPI and glottic insufficiency Patient will follow-up with his SLP and continue swallow therapy and aspiration precautions including good oral hygiene and strategies to minimize aspiration.  He will return after follow-up with his neurologist.  Will do videostrobe when he returns to determine if left vocal fold paralysis results in severe glottic insufficiency.  Update 03/16/23 He drinks milk shakes for pleasure, but is still PEG dependent. He is not sure if swallow therapy is helping.  He had multiple aspiration PNA's in the past, last one Feb 2024 No fever or dyspnea at home. Post-XRT sequelae, L VF paralysis chronic dysphonia and dysphagia PEG dependence 2/2 aspiration PNA - videostrobe today with incomplete glottic closure and evidence of thick yellow secretions in hypo/oropharynx and along naso pharyngeal wall - Augmentin  suspension BID x 2 weeks and steroid pack (Medrol  dose pack) 2. Chronic nasal congestion and right sided middle ear effusion - Augmentin /Medrol  pack as above  - nasal saline rinses with NeilMed - Flonase  2 puffs b/l nares BID 3. Chronic dysphagia and aspiration PEG/Dependent  -We discussed at length that a lot of his swallowing problems are due to postradiation side effects.  We also discussed that there could be a stricture above the level of UES and those are common in head neck  cancer survivors.  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 -We also discussed the need to get neurology evaluation to ensure he does not have any underlying neurologic conditions such as ALS due to history of normal swallowing after radiation and acute onset trouble with swallowing approximately a year ago -Will schedule for DML rigid bronchoscopy bilateral vocal fold injection augmentation, possible posterior pharyngeal wall soft palate injection augmentation, EGD and balloon dilation 4.  Eustachian tube dysfunction right side, recurrent issue per patient.  He reports poor hearing on the left side, and right side is his better ear -Will order audiogram to evaluate hearing and middle ear function -If evidence of fluid on tympanograms will consider tube placement on the right side at the time of his procedure 5. Chronic GERD LPR - continue Pepcid     RESULTS FROM OBJECTIVE SWALLOSW STUDY  FEES 05/11/23: Pt appears to have improved adduction of his vocal folds, with some movement noted on his L side. Pt says his glottis was wide open but his vocal folds appear to approximate especially on anterior end. When cued to hold his breath and bear down, he approximates his fasle vocal folds, completely obstructing view of his true vocal folds.    Oral phase is not directly observed during FEES, but pt reports limited lingual transit as consistencies get thicker. He needed to use liquid washes to try to swallow a small amount of puree.   Pt's  pharyngeal phase of swallowing is still heavily impacted by generalized hypomobility, and aspiration still occurs across consistencies. Although likely similar to MBS in June in many regards, suspect that his improved ability to adduct his cords may help some with expectoration. A combination of a reclined position and a super supraglottic swallow maneuver helps to increase airway protection during the swallow. Residue (especially in the pyriform sinuses) spills over the arytenoids and into his airway after the swallow as well. He has similar amounts of residue given thin and nectar thick liquids, and he is able to  cough some of the penetrated/aspirated material back out of his airway. Aspiration is not fully cleared, as he does have some productive coughing throughout the study that has evidence of the green food coloring used for this study. He seems to have increased difficulty clearing his airway and his pharynx with honey thick liquids and purees.    When allowed to self-regulate intake with thin liquids while using recommended strategies, he does seem to clear a lot (but not all) from his airway. He clears more this way than he does when taking single ice chips. Discussed results with pt and wife, who was able to watch this study as well. Would continue to at least consume water after oral care as he has been doing, along with HEP given by OP SLP. Encouraged him to try to start encorporating the super supraglottic swallow as well. We discussed the potential to advance to some other liquids outside of just water at the discretion of his primary team (SLP, ENT, etc.), maybe as he starts to feel back to his baseline from recent medical events. Education was provided on risk factors, outside of aspiration, that impact the risk for adverse events to occur and trying to optimize other areas (oral care, physical mobility, GI and pulmonary status, etc.) given known aspiration.              Impact on safety and  function Moderate aspiration risk;Severe aspiration risk;Risk for inadequate nutrition/hydration    (MBS)-  10/30/22:  Clinical Impression: Patient presents with mild oral and severe pharyngo-cervical esophageal dysphagia presumed due to iatrogenic effects of XRT that is significantly worse compared to prior MBS conducted by this SLP in 2018. SLP reviewed note from MBS at [facility] in 2023/11 but could not view images. 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 Head tilt to the right attempted to aid oral transiting/control with partial effectiveness. Pharyngeal swallow marked by sevfere hypomotility resulting in minimal transiting of barium into esophagus and laryngeal penetration/aspiration. Mulitple dry swallows *pt initiated* is helpful to decrease amount of retention but does not fuly clear it, even after 5-6 swallows with small single liquid bolus. Head of chair reclined to approx 45* with slight neck extension did decrease amount of penetration and aspiration as gravity faciliated retention of barium in posterior pharynx. Even in posterior position and with use of right tongue for oral transiting, pt penetrated very small bite of masticated cracker after the swallow as pharyngeal retention spilled into open airway. Cued cough effectively cleared cracker into pharynx for pt to swallow with liquids. Clearance into cervical esophagus was significantly impaired with gross retention of liquids as pyriform sinus. Question if he may benefit from intervention for PES opening to mitigate his dysphagia - advised he speak to Dr Carlie, ENT, re: potential.   Unfortunately [facility] current dysphagia is profound and given progressive deficit (presumed from XRT effects, fibrosis) for 15 months, prognosis for swallow to return to functional level is guarded at best.    Advised pt consider ordering antichoking device for emergent use.   Recommended diet:PO diet PO Diet Recommendation: Thin liquids (Level 0); Mildly thick liquids (Level 2, nectar thick); Full liquid diet, some soft solids Liquid Administration via: Cup; Straw; Spoon Medication Administration: Other (Comment) (consider suspension). Supervision: Patient able to self-feed Recommended compensations:  Swallowing strategies  : effortful swallow; Multiple dry swallows after each bite/sip; Hard cough after swallowing; Head tilt right during swallowing; Slow rate; Small bites/sips (Recline  approx 45* with slight chin elevation), Follow solids with liquids Postural changes: Partially reclined for meals Oral care recommendations: Oral care QID (4x/day) Recommended consults: Consider ENT consultation (? ENT to determine if potential PES treatment may be helpful)   PATIENT REPORTED OUTCOME MEASURES (PROM): EAT-10: Pt returned 12/05/22 and scored 36/40 (lower scores = better QOL)   TODAY'S TREATMENT:                                                                                                                                         DATE:  06/16/23: Pt has been taking thin liquids since last appointment regularly, without overt s/sx aspiration PNA. Pt appeared safer (less frequent throat clearing, no wet voice) with nectar-like (approx as thick as liquid yogurt) liquids, compared to thin liquid (water). SLP encouraged pt today to have some nutritionally based nectar liquids (not water) and cont to monitor for s/sx aspiration PNA. Additionally, pt should cont to see neurologist, ENT, and oncologists as directed. Today pt endorsed not completing HEP as directed at previous session, however pt cont'd as independent with procedure for HEP. SLP told pt to complete HEP x30 reps each exercise x6 weeks and if he thinks beneficial for swallow function to cont for 4 more weeks and then reduce frequency to x2-3/week. If not successful in improving swallowing function after 6 weeks, pt  should decr frequency to x2-3/week at that time to maintain swallow function.  He was encouraged to contact SLP/referring MD for referral back to ST if necessary.  05/18/23: Reviewed results of FEES with pt/wife. Pt cont without overt s/sx aspiration PNA. Wallice endorses he feels more lethargic/weaker now than prior to vocal fold surgery but better than during FEES, and he states, Each day is better.. Prior to vocal fold augmentation sx, pt was not completing HEP exercises >10 reps/day. SLP and pt agreed pt will perform at least 20 reps/day of Masako, effortful swallow, super-supraglottic, and tongue ROM 7 days/week for the next 6 weeks, then pt will cont with 20 reps/day at least 4 days per week to maintain strength. SLP told pt to perform thorough oral care prior to water POs, and he could try liquid yogurt and Boost/Ensure as well. SLP told pt he will likely always need to rely on PEG for adequate nutrition and hydration. SLP told pt SLP sees this as pt's new normal, and SLP sees pt enjoying one or two meal items of proper/safe consistency with family at holidays, getting ice cream once every few weeks with wife, etc. Pt admitted to SLP he had 4-5 bites of Thanksgiving and Christmas dinners with family during those holidays and SLP confirmed that was what SLP meant, and Tray needed to realize he was likely aspirating some fraction of bolus, and if he chose to do this, to ALWAYS CLOSELY monitor for overt s/sx aspiration PNA. Hovanes stated he was fine with having POs in  this manner, knowing he was likely aspirating a fraction of some/all boluses. Dorthea shared she still had handout from SLP about sx aspiration PNA and was still familiar with s/sx aspiration PNA. Mattias confirmed knowledge of overt s/sx aspiration PNA as well.  Pt performed 5 reps of each exercise in pt instructions - effortful, Masako, super-supraglottic, and lingual ROM. Pt was told he could take a small sip for effortful and for super-supraglottic  PRN. Drops were suggested for Masako. Today, pt used his saliva for effortful and Masako, and took 1/4 teaspoon sips for super-supraglottic. Pt exhibited mild wet voice with 3 1/4 teaspoons of water using lean and super-supraglottic. SLP/pt agree once/month is satisfactory at this time, with likely decr to once every two months next session x1-2 visits then re-eval if/when necessary at a later date.   04/20/23: Pt is s/p vocal fold injection sx. 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 SLP assessed pt's POs with water today. Oral mech unchanged except voice improved. Today pt was provided 1/4-1/2 teaspoons H2O after stating he had completed oral care in the last 30 minutes. Pt with consistent throat clearing using head/torso lean to rt for boluses x4. Throat clearing subsided after 4 1/2 minutes. SLP consulted Dr. Dr. Soldatova and she thought pt would be cleared to have this size bolus for practice (to minimize disuse atrophy) at home until FEES 05/11/23. SLP instructed pt to complete oral POs with 1/4-1/2 teaspoon water boluses following thorough oral care, x2-3/day, and cease if overt s/sx aspiration PNA. He should also cont dysphagia exercises until next visit.   03/23/23: Noted Dr. Okey appt 03/16/23 and results above. Pt is no longer having POs other than one bottle of water/day, since 03/16/23, due to being extra careful about surgery date. SLP told pt he can do this but should cont with the HEP for swallowing once/day. HEP completed with independence. Pt with consistent throat clear with liquids however is doing this for safety. Speech tx: Pt should cont HEP for speech and if he notes his speech beginning to be more dysarthric during HEP, he should stop and wait 30 minutes to complete the HEP. Today he completed the HEP with specific difficulty articulating velar (k,g) and liquids (for example, s, sh, z). Pt with EGD balloon dilation and  vocal fold injection sx 04/14/23 so next visit after that on 04/20/23.   02/20/23:  Swallow: Next session, pt can reduce HEP to x2/week. Pt currently having thin to nectar liquids. No overt s/sx aspiration PNA today. Pt with f/u with neurology (Dr. Valetta - [facility]) 03/12/23. He was independent with HEP. Is going to protrude tongue more with Masako from now on. With POs - pt had drinkable yogurt today with occasional throat clearing, pt stated due mostly to feeling of residue. SLP postulates pt with more frequent s/sx velopharyngeal incompetence during swallowing. Pt admitted to taking larger sips. SLP encouraged smaller sips.  Pt states his speech is more slurred after talking. SLP questions some neurological basis worsening pt's overall swallowing and speech ability. Follow up with neurologist 03/12/23. Pt and SLP agree pt could return in 4 weeks instead of 2. Speech: SLP discussed and educated pt and wife re: speech compensations, and provided a handout of articulatory HEP and reviewed with pt and wife. Pt to begin this asap for speech compensations.   01/23/23: Pt saw Dr. Soldatova 01/19/23: Assessment/plan above under Objective findings. SLP discussed that pt should  perform HEP until mid-October. No plan to have follow up MBS at this time due to pt performance in ST sessions relatively unchanged in last 2-3 sessions. With POs (wet puree and water), wet voice intermittently, pt using safe swallow compensations with success. No overt s/sx aspiration PNA at this time. SLP told pt to cont with current solid consistency - said could have puree soups (homogenous consistency) and broths - teaspoons.  Pt completed HEP with independence. Pt agrees with SLP his condition is currently stable and will cont to be seen every other week for two months. 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  01/16/23: Pt to see Dr. Soldatova 01/19/23. Pt performed oral care prior to ST today. SLP plan is to have pt perform dysphagia HEP for approx 10 weeks prior to MBS (which will be end of September). Pt appears to cont to gain swallow strength week after week. With POs, >75% of the time Bucky had audible swallow, possibly insufficient velopharyngeal closure, as pt again (as in previous session) sniffed after swallowing water. To this point, pt has remained free of overt s/sx aspiration PNA and has followed safe swallow strategies in sessions, sometimes with rare initial reminder to cough/clear throat after 2nd or 3rd swallow with each bolus. Wet voice heard approx 50% of the time with yogurt and water, mostly cleared with multiple swallows. 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  Pt was independent with HEP.   01/08/23: SLP  educated pt/wife rationale for waiting to schedule MBS/FEES until mid-September or later will give Hunter ample time for strength gain in swallowing musculature (at least 8 weeks). No overt s/sx aspiration PNA this session; pt ate small spoons of baby food yams, and drank water. SLP had to provide initial cue to cough after a bite of yams (pt was inconsistent) but then [place] cont'd with consistent coughing after last swallow of bolus. PT to cont with solid POs and liquid POs at home. Appointment with Dr. Soldatova is 01/19/23. Min A with HEP for tongue protrusion on Masako. Will keep pt once/week to monitor for s/sx PNA and to possibly upgrade pt to regular pureed (instead of baby food) after assessment in-session.  01/01/23: Pt reports difficult to get trigger on first 1-3 reps of Masako and supraglottic but once he passes these number of reps - no difficulty with triggering swallow. No problem at all triggering effortful swallow from rep #1 on. Today pt did not have difficulty with triggering swallows with  Masako. Although lingual protrusion was adequate, SLP told pt to protrude tongue a bit further for the first 2 reps and he could do so. SLP told pt protruding tongue to that length was preferable to the length he currently uses, so he should try to have more reps with further protrusion. Last session, SLP added dynamic Shaker to pt's regimen, 30 1-second reps at least BID. With POs, pt made through one 4 oz jar in one week. SLP suggested pt use drinkable yogurt, Fair life milk, or Boost as POs as well, as long as pt is using safe swallow techniques from MBS 10/30/22. Pt req'd cues for a cough after every bite/sip, and SLP provided rationale and reminder that pt's sensation was not  100% when material was penetrated.  12/26/22: No s/sx any aspiration PNA today, nor in this last week  at all. Pt cont to perform PO trials with water at home, but coughs with every 3-4th swallow. SLP strongly urged pt to complete cough every swallow based upon MBS results. Pt followed other precautions with independence. SLP trialed baby food applesauce with pt with liquid wash and completed with success. SLP told pt to try 10 boluses as many times/day as desired but always stopping if he feels he has less than 8/10 stamina (10=WNL), and always perform thorough oral care before and after POs. SLP also suggested pt could try baby food applesauce-consistency pudding or yogurt. Pt completed HEP today independently. Due to initiating puree today pt will keep frequency at once/week.   12/19/22: No s/sx any aspiration PNA today, nor in this last week at all. Performed oral care just prior to appointment today. Pt independent with HEP today. Protruded tongue past lips independently. Pt cont to take one bottle of water per day, multiple (x2-3) sips per bolus with multiple hard swallows and cough if necessary. Instead, SLP told pt to take one sip with two-three hard swallows, cough, reswallow hard. SLP to attempt smooth purees with pt next  session -will have been 4 weeks since initiation of HEP and pt without any overt s/sx aspiration PNA at this time.  12/12/22: Pt performed oral care prior to ST. Long discussion about whether or not BaSwallow exam would be safe for pt at this time. Afterwards, SLP emailed SLP who performed MBS with pt and she stated BaSwallow exam might be more difficult to perform safely at this time. Pt performed HEP independently and used safe swallow techniques with sips of water.   12/05/22: Pt independent with HEP today, will use mirror for Masako until extent of tongue protrusion habituated. Pt demo'd strategies with sip water today, independently. Pt and SLP agree pt can reduce to once/week. SLP provided s/sx aspiration PNA and pt with mod I.  12/03/22: Pt has been performing HEP regularly. Marylene he performed exercises with initial mod cue for Masako (tongue protrusion).  He drank water without overt s/sx aspiration x3 using safe swallow strategies independently.  11/24/22:  Today SLP explained results of pt's MBS and the rationale for the safe swallow strategies. Pt demonstrated understanding of each and reports he has been using these with one bottle of water each day. No overt s/sx of aspiration PNA to date with >1 week of PO water. Pt has NOT been performing oral care prior to POs. SLP educated pt re: swallow HEP procedure. Pt performed each exercise and SLP ensured correctness prior to moving to next exercise. Pt req'd min A with strength of cough with supraglottic.  PATIENT EDUCATION: Education details: see today's treatment Person educated: Patient and Spouse Education method: Explanation and Demonstration Education comprehension: verbalized understanding, returned demonstration, and needs further education   ASSESSMENT:  CLINICAL IMPRESSION: Patient is a 63 y.o. M who was seen today for treatment of swallowing. Pt is now s/p balloon dilation, vocal fold injection, and ear tube placement. He had  FEES on 05/11/23 with results above. Please see today's treatment for more details. He cont to not desire formal ST for speech intelligibility at this time. Pt agreed with d/c today as pt's status largely unchanged from previous session. He should request referral to ST again if necessary in future.     OBJECTIVE IMPAIRMENTS: include dysarthria and dysphagia. These impairments are limiting patient from ADLs/IADLs, effectively communicating at home and in community, and safety when swallowing. Factors affecting potential to achieve goals and functional  outcome are co-morbidities and severity of impairments. Patient will benefit from skilled SLP services to address above impairments and improve overall function.  REHAB POTENTIAL: Fair given prognosis from MBS   GOALS: Goals reviewed with patient? Yes  SHORT TERM GOALS: Target date: 12/25/22  Pt will perform HEP with rare min A in two sessions Baseline:12/03/22 Goal status: Met  2.  Pt will follow prescribed safe swallow strategies in 3 sessions Baseline: 12/03/22, 12/12/22 Goal status: Partially met  3.  Pt will perform HEP 6/7 days a week for two weeks, per HEP monitoring method Baseline:  Goal status: MET  4.  Pt and/or wife will ID 3 overt s/sx aspiration PNA with modified independence Baseline:  Goal status: met   LONG TERM GOALS: Target date: 01/30/23; 05/21/23  Pt will perform HEP with rare min A in two sessions Baseline: 12/19/22 Goal status: Met  2.  Pt will follow prescribed safe swallow strategies in 3 sessions, after 12/25/22 Baseline: 01/08/23, 01/16/23 Goal status: met  3.  Pt will undergo follow up MBS when/if clinically indicated Baseline:  Goal status: Partially met (FEES)  4.  Pt will score higher on PROM than initial score Baseline:  Goal status: DEFERRED, and ongoing  5.   Pt will perform HEP independently in three sessions Baseline: 01/16/23, 01/23/23 Goal status: Met  6.   Pt will remain free of overt s/sx  aspiration PNA over five sessions Baseline: 02/20/23, 04/20/23, 05/18/23 Goal Status: Partially met   PLAN: discharge today  PLANNED INTERVENTIONS: Aspiration precaution training, Pharyngeal strengthening exercises, Diet toleration management , Environmental controls, Trials of upgraded texture/liquids, Cueing hierachy, Internal/external aids, SLP instruction and feedback, Compensatory strategies, Patient/family education, and Repeat MBS PRN    Wesly Whisenant, CCC-SLP 06/16/2023, 11:22 AM

## 2023-06-25 DIAGNOSIS — Y842 Radiological procedure and radiotherapy as the cause of abnormal reaction of the patient, or of later complication, without mention of misadventure at the time of the procedure: Secondary | ICD-10-CM | POA: Diagnosis not present

## 2023-06-25 DIAGNOSIS — R634 Abnormal weight loss: Secondary | ICD-10-CM | POA: Diagnosis not present

## 2023-06-25 DIAGNOSIS — R1310 Dysphagia, unspecified: Secondary | ICD-10-CM | POA: Diagnosis not present

## 2023-06-25 DIAGNOSIS — Z85818 Personal history of malignant neoplasm of other sites of lip, oral cavity, and pharynx: Secondary | ICD-10-CM | POA: Diagnosis not present

## 2023-06-29 ENCOUNTER — Telehealth: Payer: Self-pay | Admitting: *Deleted

## 2023-06-29 NOTE — Telephone Encounter (Signed)
Called patient to inform of CT for 07-21-23- arrival time- 1:30 pm @ [facility], no restrictions to scan, patient to receive results from Dr. Basilio Cairo on  07-28-23 @ 3 pm, lvm for a return call

## 2023-06-30 ENCOUNTER — Telehealth: Payer: Self-pay | Admitting: *Deleted

## 2023-06-30 ENCOUNTER — Other Ambulatory Visit: Payer: Self-pay | Admitting: Radiology

## 2023-06-30 DIAGNOSIS — C090 Malignant neoplasm of tonsillar fossa: Secondary | ICD-10-CM

## 2023-06-30 NOTE — Telephone Encounter (Signed)
Called patient to inform of CT for 09-29-23- arrival time- 2:45 pm @ [facility], no restrictions to scan, patient to receive results from Dr. Basilio Cairo on 10/06/23 @ 2:20 pm, spoke with patient's wife- Arline Asp and she is aware of these appts. and the instructions

## 2023-07-17 DIAGNOSIS — Z931 Gastrostomy status: Secondary | ICD-10-CM | POA: Diagnosis not present

## 2023-07-17 DIAGNOSIS — Y842 Radiological procedure and radiotherapy as the cause of abnormal reaction of the patient, or of later complication, without mention of misadventure at the time of the procedure: Secondary | ICD-10-CM | POA: Diagnosis not present

## 2023-07-17 DIAGNOSIS — R634 Abnormal weight loss: Secondary | ICD-10-CM | POA: Diagnosis not present

## 2023-07-17 DIAGNOSIS — Z85818 Personal history of malignant neoplasm of other sites of lip, oral cavity, and pharynx: Secondary | ICD-10-CM | POA: Diagnosis not present

## 2023-07-21 ENCOUNTER — Ambulatory Visit (HOSPITAL_COMMUNITY): Payer: BC Managed Care – PPO

## 2023-07-22 DIAGNOSIS — I129 Hypertensive chronic kidney disease with stage 1 through stage 4 chronic kidney disease, or unspecified chronic kidney disease: Secondary | ICD-10-CM | POA: Diagnosis not present

## 2023-07-22 DIAGNOSIS — E46 Unspecified protein-calorie malnutrition: Secondary | ICD-10-CM | POA: Diagnosis not present

## 2023-07-22 DIAGNOSIS — R7301 Impaired fasting glucose: Secondary | ICD-10-CM | POA: Diagnosis not present

## 2023-07-22 DIAGNOSIS — E039 Hypothyroidism, unspecified: Secondary | ICD-10-CM | POA: Diagnosis not present

## 2023-07-23 DIAGNOSIS — R1310 Dysphagia, unspecified: Secondary | ICD-10-CM | POA: Diagnosis not present

## 2023-07-23 DIAGNOSIS — Y842 Radiological procedure and radiotherapy as the cause of abnormal reaction of the patient, or of later complication, without mention of misadventure at the time of the procedure: Secondary | ICD-10-CM | POA: Diagnosis not present

## 2023-07-23 DIAGNOSIS — Z85818 Personal history of malignant neoplasm of other sites of lip, oral cavity, and pharynx: Secondary | ICD-10-CM | POA: Diagnosis not present

## 2023-07-23 DIAGNOSIS — R634 Abnormal weight loss: Secondary | ICD-10-CM | POA: Diagnosis not present

## 2023-07-28 ENCOUNTER — Inpatient Hospital Stay: Payer: BC Managed Care – PPO | Attending: Hematology and Oncology | Admitting: Dietician

## 2023-07-28 ENCOUNTER — Ambulatory Visit: Payer: Self-pay | Admitting: Radiation Oncology

## 2023-07-28 NOTE — Progress Notes (Signed)
 Nutrition Follow-up:  Patient completed concurrent chemoradiation for tonsil cancer. Final radiation 05/15/16 under the care of Dr. Basilio Cairo. Patient now with dysphagia and recurrent aspiration pneumonia. S/p DML injection and EGD with balloon dilation on 12/3 under the care of Dr. Irene Pap.    Tube exchange 12/19 FEES on 1/13 showed aspiration and penetration   Met with pt and wife in office. Pt reports doing well overall. Diet advanced to nectar thick liquids per SLP. Pt is weary of oral intake secondary to history of aspiration pneumonia. He is tolerating tube feedings at goal. Pt denies nausea, vomiting, diarrhea, constipation.    Medications: reviewed   Labs: no new labs  Anthropometrics: Wt 168.3 lb on 3/12 increased   1/31 - 165 lb 9.6 oz    NUTRITION DIAGNOSIS: Inadequate oral intake ongoing - pt is tube dependent    MALNUTRITION DIAGNOSIS: Severe malnutrition resolved    INTERVENTION:  Suggest reducing Boost VHC - one carton QID via PEG Encourage daily po trials of nectar thick liquids   Continue swallowing exercises per SLP   MONITORING, EVALUATION, GOAL: wt trends, TF, po   NEXT VISIT: Tuesday May 27 in clinic

## 2023-08-10 DIAGNOSIS — M5412 Radiculopathy, cervical region: Secondary | ICD-10-CM | POA: Diagnosis not present

## 2023-08-10 DIAGNOSIS — G959 Disease of spinal cord, unspecified: Secondary | ICD-10-CM | POA: Diagnosis not present

## 2023-08-10 DIAGNOSIS — G6282 Radiation-induced polyneuropathy: Secondary | ICD-10-CM | POA: Diagnosis not present

## 2023-08-12 DIAGNOSIS — R634 Abnormal weight loss: Secondary | ICD-10-CM | POA: Diagnosis not present

## 2023-08-12 DIAGNOSIS — Z85818 Personal history of malignant neoplasm of other sites of lip, oral cavity, and pharynx: Secondary | ICD-10-CM | POA: Diagnosis not present

## 2023-08-12 DIAGNOSIS — Y842 Radiological procedure and radiotherapy as the cause of abnormal reaction of the patient, or of later complication, without mention of misadventure at the time of the procedure: Secondary | ICD-10-CM | POA: Diagnosis not present

## 2023-09-29 ENCOUNTER — Ambulatory Visit (HOSPITAL_COMMUNITY)
Admission: RE | Admit: 2023-09-29 | Discharge: 2023-09-29 | Disposition: A | Discharge status: Home / Self Care | Payer: BC Managed Care – PPO | Source: Ambulatory Visit | Attending: Radiology | Admitting: Radiology

## 2023-09-29 DIAGNOSIS — C090 Malignant neoplasm of tonsillar fossa: Secondary | ICD-10-CM | POA: Diagnosis not present

## 2023-09-29 LAB — POCT I-STAT CREATININE: Creatinine, Ser: 1.4 mg/dL — ABNORMAL HIGH (ref 0.61–1.24)

## 2023-09-29 MED ORDER — IOHEXOL 300 MG/ML  SOLN
75.0000 mL | Freq: Once | INTRAMUSCULAR | Status: AC | PRN
  Administered 2023-09-29: 75 mL via INTRAVENOUS

## 2023-10-06 ENCOUNTER — Encounter: Admitting: Dietician

## 2023-10-06 ENCOUNTER — Ambulatory Visit: Payer: Self-pay | Admitting: Radiation Oncology

## 2023-10-16 NOTE — Progress Notes (Signed)
 Todd [name] presents today in the clinic for a follow up. 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  Patient is doing well post radiation treatment.  Pain issues, if any: Patient denies any throat pain. Using a feeding tube?: Patient has G Tube since November 03, 2022, has it replaced 6 months. Has another appointment to replace feeding tube. Weight changes, if any: Patients weigh has stayed stable is around 175 Swallowing issues, if any: Yes,  Smoking or chewing tobacco? None Using fluoride  toothpaste daily? Yes Last ENT visit was on: January 2025 and another appointment in July/August 2025. Other notable issues, if any:  None  BP (!) 196/114 (BP Location: Right Arm, Patient Position: Sitting, Cuff Size: Normal)   Pulse 65   Temp 97.6 F (36.4 C)   Resp 18   Ht 5\' 9"  (1.753 m)   Wt 174 lb 9.6 oz (79.2 kg)   SpO2 96%   BMI 25.78 kg/m     Wt Readings from Last 3 Encounters:  10/20/23 174 lb 9.6 oz (79.2 kg)  06/09/23 165 lb 9.6 oz (75.1 kg)  04/16/23 154 lb 1.6 oz (69.9 kg)

## 2023-10-19 DIAGNOSIS — I129 Hypertensive chronic kidney disease with stage 1 through stage 4 chronic kidney disease, or unspecified chronic kidney disease: Secondary | ICD-10-CM | POA: Diagnosis not present

## 2023-10-19 DIAGNOSIS — R7301 Impaired fasting glucose: Secondary | ICD-10-CM | POA: Diagnosis not present

## 2023-10-20 ENCOUNTER — Inpatient Hospital Stay: Attending: Hematology and Oncology | Admitting: Dietician

## 2023-10-20 ENCOUNTER — Ambulatory Visit
Admission: RE | Admit: 2023-10-20 | Discharge: 2023-10-20 | Disposition: A | Discharge status: Home / Self Care | Source: Ambulatory Visit | Attending: Radiation Oncology | Admitting: Radiation Oncology

## 2023-10-20 ENCOUNTER — Other Ambulatory Visit: Payer: Self-pay

## 2023-10-20 VITALS — BP 196/114 | HR 65 | Temp 97.6°F | Resp 18 | Ht 69.0 in | Wt 174.6 lb

## 2023-10-20 DIAGNOSIS — C099 Malignant neoplasm of tonsil, unspecified: Secondary | ICD-10-CM

## 2023-10-20 DIAGNOSIS — Z923 Personal history of irradiation: Secondary | ICD-10-CM | POA: Insufficient documentation

## 2023-10-20 DIAGNOSIS — M5030 Other cervical disc degeneration, unspecified cervical region: Secondary | ICD-10-CM | POA: Insufficient documentation

## 2023-10-20 DIAGNOSIS — Z79899 Other long term (current) drug therapy: Secondary | ICD-10-CM | POA: Diagnosis not present

## 2023-10-20 DIAGNOSIS — C090 Malignant neoplasm of tonsillar fossa: Secondary | ICD-10-CM

## 2023-10-20 DIAGNOSIS — M47812 Spondylosis without myelopathy or radiculopathy, cervical region: Secondary | ICD-10-CM | POA: Insufficient documentation

## 2023-10-20 DIAGNOSIS — Z85819 Personal history of malignant neoplasm of unspecified site of lip, oral cavity, and pharynx: Secondary | ICD-10-CM | POA: Diagnosis not present

## 2023-10-20 DIAGNOSIS — J984 Other disorders of lung: Secondary | ICD-10-CM | POA: Diagnosis not present

## 2023-10-20 DIAGNOSIS — E039 Hypothyroidism, unspecified: Secondary | ICD-10-CM | POA: Insufficient documentation

## 2023-10-20 NOTE — Progress Notes (Signed)
 Radiation Oncology         [phone] ________________________________  Name: Todd [name] MRN: [id number]  Date: 10/20/2023  DOB: 09-12-1960  Follow-Up Visit Note  Outpatient  CC: Tisovec, Kristina Pfeiffer, MD  Tisovec, Kristina Pfeiffer, MD  Diagnosis and Prior Radiotherapy:  C09.0   ICD-10-CM   1. 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 Tonsil cancer (HCC)  C09.9        Cancer Staging  Tonsil cancer (HCC) Staging form: Pharynx - Oropharynx, AJCC 7th Edition - Clinical stage from 02/25/2016: Stage IVB (T3, N3, M0) - Signed by Almeda Jacobs, MD on 02/27/2016 Staged by: Managing physician Diagnostic confirmation: Positive histology Specimen type: Aspirate Histopathologic type: Squamous cell carcinoma, NOS Laterality: Bilateral  CHIEF COMPLAINT:  swallowing issues, post-radiation treatment.   Narrative:  [name] presents for follow up and to review recent imaging. He completed his radiation treatment to his tonsillar fossa on 05/15/16.   And neck on 09/29/2023 demonstrates no evidence of residual or recurrent disease; development of dense calcification of the vocal folds; no evidence of metastatic disease in the chest.    Since his last imaging he did undergo vocal cord Botox injection with Dr. Soldatova.  He reports that his hoarseness has been stable  Pain issues, if any: Patient denies any throat pain. Using a feeding tube?: Patient has G Tube since November 03, 2022, has it replaced 6 months. Has another appointment to replace feeding tube. Weight changes, if any: Patients weigh has stayed stable is around 175 Swallowing issues, if any: Yes,  Smoking or chewing tobacco? None Using fluoride  toothpaste daily? Yes Last ENT visit was on: January 2025 and another appointment in July/August 2025. Other notable issues, if any:  He has had thick secretions in his throat and foamy saliva in his mouth.  He is interested in a Yankauer suction device  Recent esophageal  dilatation did not help swallowing, but tympanostomy did help inner ear symptoms.            Wt Readings from Last 3 Encounters:  10/20/23 174 lb 9.6 oz (79.2 kg)  06/09/23 165 lb 9.6 oz (75.1 kg)  04/16/23 154 lb 1.6 oz (69.9 kg)              ALLERGIES:  is allergic to phenergan  [promethazine  hcl], bactrim [sulfamethoxazole-trimethoprim], and tamsulosin.  Meds: Current Outpatient Medications  Medication Sig Dispense Refill   acetaminophen  (TYLENOL ) 500 MG tablet Place 1,000 mg into feeding tube daily. LIQUID FORM ONLY     cetirizine (ZYRTEC) 10 MG tablet Place 10 mg into feeding tube daily.     fenofibrate (TRICOR) 145 MG tablet Take 145mg  tablet per tube every evening     levothyroxine  (SYNTHROID ) 125 MCG tablet Place 125 mcg into feeding tube every evening.     Coenzyme Q10 (COQ-10) 200 MG CAPS Give 200 mg by tube daily. (Patient not taking: Reported on 10/20/2023)     famotidine  (PEPCID ) 20 MG tablet Place 20 mg into feeding tube at bedtime. (Patient not taking: Reported on 10/20/2023)     fluticasone  (FLONASE ) 50 MCG/ACT nasal spray Place 2 sprays into both nostrils daily. (Patient not taking: Reported on 10/20/2023) 16 g 6   glucosamine-chondroitin 500-400 MG tablet Take 2 tablets by mouth every morning. Chewables (Patient not taking: Reported on 10/20/2023)     meloxicam  (MOBIC ) 7.5 MG tablet Take 1 tablet (7.5 mg total) by mouth daily as needed for pain. (Patient not taking: Reported  on 10/20/2023)     methylPREDNISolone  (MEDROL  DOSEPAK) 4 MG TBPK tablet Take with signs of chronic sinusitis and take as directed (Patient not taking: Reported on 10/20/2023) 1 each 1   pantoprazole  (PROTONIX ) 40 MG tablet Crush 1 tablet and place in tube daily (Patient not taking: Reported on 10/20/2023) 60 tablet 0   No current facility-administered medications for this encounter.    Physical Findings:    The patient is in no acute distress. Patient is alert and oriented.  height is 5' 9 (1.753 m) and  weight is 174 lb 9.6 oz (79.2 kg). His temperature is 97.6 F (36.4 C). His blood pressure is 196/114 (abnormal) and his pulse is 65. His respiration is 18 and oxygen saturation is 96%. .     General: Alert and oriented, in no acute distress, hoarse, speech is somewhat slurred. HEENT: Head is normocephalic. Extraocular movements are intact. Oropharynx is clear of tumor but the oral tongue is notable for fasciculations and atrophy and his tongue deviates slightly to the left.   Neck: Neck is supple, no palpable cervical or supraclavicular lymphadenopathy.  Fibrosis noted bilaterally. Lymphatics: see Neck Exam Skin: He has some scabbed lesions over the left neck without any vesicles, not a clinical concern Neurologic: Ambulatory.  Alert and oriented x 3.  See HEENT Psychiatric: Judgment and insight are intact. Affect is appropriate. Heart is regular in rate and rhythm Chest clear to auscultation bilaterally Abdomen notable for PEG tube, soft nontender nondistended Musculoskeletal exam reveals that he is well-nourished, ambulatory  Lab Findings: Lab Results  Component Value Date   WBC 5.4 04/15/2023   HGB 13.3 04/15/2023   HCT 40.8 04/15/2023   MCV 89.7 04/15/2023   PLT 311 04/15/2023    Radiographic Findings: CT Soft Tissue Neck W Contrast Result Date: 10/15/2023 CLINICAL DATA:  History of tonsillar cancer.  Follow-up. EXAM: CT NECK WITH CONTRAST TECHNIQUE: Multidetector CT imaging of the neck was performed using the standard protocol following the bolus administration of intravenous contrast. RADIATION DOSE REDUCTION: This exam was performed according to the departmental dose-optimization program which includes automated exposure control, adjustment of the mA and/or kV according to patient size and/or use of iterative reconstruction technique. CONTRAST:  75mL OMNIPAQUE  IOHEXOL  300 MG/ML  SOLN COMPARISON:  04/06/2023 FINDINGS: Pharynx and larynx: No evidence of residual or recurrent mass  lesion with specific attention to the left tonsillar region. Post treatment deformity in the region of the left tonsil in adjacent parapharyngeal space consistent with previous radiation. No evidence of increasing mass effect. No new mass lesion seen elsewhere. Patient has developed dense calcification of the vocal folds since the prior examination. Salivary glands: Chronic atrophic changes of the submandibular and parotid glands. Thyroid : Diminutive thyroid  tissue.  No mass. Lymph nodes: No lymphadenopathy on either side of the neck. Vascular: No significant vascular finding. Limited intracranial: Normal Visualized orbits: Normal Mastoids and visualized paranasal sinuses: Chronic opacified right maxillary sinus with postsurgical changes. Other sinuses are clear. Skeleton: Chronic degenerative cervical spondylosis. Chronic fusion at C4-5. Upper chest: Scarring at both lung apices.  No acute process. Other: None IMPRESSION: 1. No evidence of residual or recurrent mass lesion with specific attention to the left tonsillar region. Post treatment deformity in the region of the left tonsil and adjacent parapharyngeal space consistent with previous radiation. No evidence of increasing mass effect. No lymphadenopathy. 2. Development of dense calcification of the vocal folds since the prior examination. 3. Chronic opacified right maxillary sinus with postsurgical changes. 4.  Chronic degenerative cervical spondylosis. Chronic fusion at C4-5. Electronically Signed   By: Bettylou Brunner M.D.   On: 10/15/2023 14:30   CT Chest W Contrast Result Date: 10/10/2023 EXAM: CT CHEST WITH CONTRAST 09/29/2023 03:12:25 PM TECHNIQUE: CT of the chest was performed with the administration of intravenous contrast. Multiplanar reformatted images are provided for review. Automated exposure control, iterative reconstruction, and/or weight based adjustment of the mA/kV was utilized to reduce the radiation dose to as low as reasonably achievable.  COMPARISON: 04/06/2023 CLINICAL HISTORY: Head/neck cancer, monitor. Malignant neoplasm of tonsillar fossa. Radiation complete. Barely able to talk. FINDINGS: MEDIASTINUM: Heart and pericardium are unremarkable. The central airways are clear. LYMPH NODES: No mediastinal, hilar or axillary lymphadenopathy. LUNGS AND PLEURA: Left lower lobe scarring. Mild radiation changes at the bilateral lung apices. No focal consolidation or pulmonary edema. No pleural effusion or pneumothorax. SOFT TISSUES/BONES: No acute abnormality of the bones or soft tissues. UPPER ABDOMEN: Stable subcentimeter probable cyst in the inferior right hepatic lobe (image 58). Cholelithiasis, without associated inflammatory changes. IMPRESSION: 1. No evidence of metastatic disease in the chest. Electronically signed by: Zadie Herter MD 10/10/2023 08:19 PM EDT RP Workstation: [id number]    Impression/Plan:   This is a patient well-known by me with a history of locally advanced oropharyngeal cancer treated in 2017.  From an oncologic standpoint he is cured and we will continue imaging as clinically indicated in the future if concerning symptoms arise, otherwise we will just continue physical exams.  His imaging today shows no evidence of disease.  He does have calcifications in his vocal folds.  I am not sure if this is in any way related to a recent Botox injection.  He will talk to Dr. Soldatova about this.  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  Based on my review of the literature and workup by his neurologist, this seems entirely possible.  The patient is relieved that it is less likely that he has ALS.  He feels that his symptoms overall are stable to improved.   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  We will order Yankauer suction device and home health to help with his thick secretions  I will see him back in 6 months.  Sooner as needed.  He is pleased with this plan.  On date of service, in total, I spent 40 minutes on this encounter. Patient was seen in person.   Note signed after encounter date; minutes pertain to date of service, only.   ____________________________   Colie Dawes, MD

## 2023-10-20 NOTE — Progress Notes (Signed)
 Nutrition Follow-up:  Patient completed concurrent chemoradiation for tonsil cancer. Final radiation 05/15/16 under the care of Dr. Lurena Sally. Patient now with dysphagia and recurrent aspiration pneumonia. S/p DML injection and EGD with balloon dilation on 12/3 under the care of Dr. Larkin Plumb.    Tube exchange 12/19 FEES on 1/13 showed aspiration and penetration   Met with patient and wife in office. Patient is fully relying on tube. He has stopped doing swallowing exercises. Patient reports thick mucous coating back of throat which is difficult to get up and out. Ongoing dry mouth. Patient tried reducing Boost VHC to 4/day, however increased back to 5 secondary to feeling hungry. Patient giving 4 bottles water via tube. He is tolerating tube feeds well.    Medications: reviewed   Labs: no new labs   Anthropometrics: Wt 174.8 lb today in RD office - trending up (back to UBW per pt)  3/12 - 168.3 lb  1/31 - 165.8 lb   NUTRITION DIAGNOSIS: Inadequate oral intake - PEG dependent   MALNUTRITION DIAGNOSIS: resolved    INTERVENTION:  Suggested adding 5th bottle of water in the evening vs additional Boost VHC Samples of Liquid Hope, syringes, mesh briefs provided  Suggested trying xylitol melts for dry mouth overnight     MONITORING, EVALUATION, GOAL: wt trends, TF   NEXT VISIT: To be scheduled as needed

## 2023-10-21 ENCOUNTER — Encounter: Payer: Self-pay | Admitting: Radiation Oncology

## 2023-10-21 ENCOUNTER — Telehealth: Payer: Self-pay | Admitting: Radiation Oncology

## 2023-10-21 ENCOUNTER — Other Ambulatory Visit: Payer: Self-pay

## 2023-10-21 DIAGNOSIS — Y842 Radiological procedure and radiotherapy as the cause of abnormal reaction of the patient, or of later complication, without mention of misadventure at the time of the procedure: Secondary | ICD-10-CM | POA: Diagnosis not present

## 2023-10-21 DIAGNOSIS — Z85818 Personal history of malignant neoplasm of other sites of lip, oral cavity, and pharynx: Secondary | ICD-10-CM | POA: Diagnosis not present

## 2023-10-21 DIAGNOSIS — R1310 Dysphagia, unspecified: Secondary | ICD-10-CM | POA: Diagnosis not present

## 2023-10-21 DIAGNOSIS — Z931 Gastrostomy status: Secondary | ICD-10-CM | POA: Diagnosis not present

## 2023-10-21 DIAGNOSIS — R634 Abnormal weight loss: Secondary | ICD-10-CM | POA: Diagnosis not present

## 2023-10-21 DIAGNOSIS — C090 Malignant neoplasm of tonsillar fossa: Secondary | ICD-10-CM

## 2023-10-21 NOTE — Progress Notes (Signed)
 Oncology Nurse Navigator Documentation   I met with Todd [name] and his wife during his follow up with Dr. Lurena Sally today. He is doing well and will follow up again with Dr. Lurena Sally in 6 months. I've also placed an order for a home suction machine at Dr. Barrie Borer request for patient. He and his wife have my direct contact number and know to call me if I can help them in anyway.   Lynetta Saran RN, BSN, OCN Head & Neck Oncology Nurse Navigator [facility] at [facility] Phone # [phone]  Fax # [phone]

## 2023-10-21 NOTE — Telephone Encounter (Signed)
 6/11 Faxed orders to [employer] (fax # [phone]) for Home Suction Machine for patient.

## 2023-10-22 ENCOUNTER — Encounter: Payer: Self-pay | Admitting: Hematology and Oncology

## 2023-10-28 ENCOUNTER — Other Ambulatory Visit (HOSPITAL_COMMUNITY): Payer: BC Managed Care – PPO

## 2023-10-29 ENCOUNTER — Other Ambulatory Visit: Payer: Self-pay | Admitting: Radiology

## 2023-10-29 ENCOUNTER — Ambulatory Visit (HOSPITAL_COMMUNITY)
Admission: RE | Admit: 2023-10-29 | Discharge: 2023-10-29 | Disposition: A | Discharge status: Home / Self Care | Source: Ambulatory Visit | Attending: Interventional Radiology | Admitting: Interventional Radiology

## 2023-10-29 DIAGNOSIS — Z431 Encounter for attention to gastrostomy: Secondary | ICD-10-CM | POA: Diagnosis not present

## 2023-10-29 DIAGNOSIS — C099 Malignant neoplasm of tonsil, unspecified: Secondary | ICD-10-CM | POA: Diagnosis not present

## 2023-10-29 HISTORY — PX: IR REPLACE G-TUBE SIMPLE WO FLUORO: IMG2323

## 2023-10-29 MED ORDER — SILVER NITRATE-POT NITRATE 75-25 % EX MISC
CUTANEOUS | Status: AC
  Filled 2023-10-29: qty 10

## 2023-10-29 MED ORDER — SILVER NITRATE-POT NITRATE 75-25 % EX MISC: 1.0000 | CUTANEOUS | Status: DC | PRN

## 2023-10-29 NOTE — Procedures (Signed)
 Patient's 22 French balloon retention gastrostomy tube was exchanged out for a new 22 French balloon retention gastrostomy tube without immediate complications.  Balloon filled with 10 cc saline.  Silver nitrate applied to granulation tissue at insertion site due to reports of mild bleeding from area. EBL < 1 cc.

## 2023-11-20 DIAGNOSIS — Y842 Radiological procedure and radiotherapy as the cause of abnormal reaction of the patient, or of later complication, without mention of misadventure at the time of the procedure: Secondary | ICD-10-CM | POA: Diagnosis not present

## 2023-11-20 DIAGNOSIS — R634 Abnormal weight loss: Secondary | ICD-10-CM | POA: Diagnosis not present

## 2023-11-20 DIAGNOSIS — R1310 Dysphagia, unspecified: Secondary | ICD-10-CM | POA: Diagnosis not present

## 2023-11-20 DIAGNOSIS — Z85818 Personal history of malignant neoplasm of other sites of lip, oral cavity, and pharynx: Secondary | ICD-10-CM | POA: Diagnosis not present

## 2023-11-23 ENCOUNTER — Encounter (INDEPENDENT_AMBULATORY_CARE_PROVIDER_SITE_OTHER): Payer: Self-pay | Admitting: Otolaryngology

## 2023-11-23 ENCOUNTER — Ambulatory Visit (INDEPENDENT_AMBULATORY_CARE_PROVIDER_SITE_OTHER): Payer: BC Managed Care – PPO | Admitting: Otolaryngology

## 2023-11-23 VITALS — BP 94/61 | HR 61

## 2023-11-23 DIAGNOSIS — K148 Other diseases of tongue: Secondary | ICD-10-CM

## 2023-11-23 DIAGNOSIS — R1310 Dysphagia, unspecified: Secondary | ICD-10-CM

## 2023-11-23 DIAGNOSIS — Z85819 Personal history of malignant neoplasm of unspecified site of lip, oral cavity, and pharynx: Secondary | ICD-10-CM

## 2023-11-23 DIAGNOSIS — K1379 Other lesions of oral mucosa: Secondary | ICD-10-CM

## 2023-11-23 DIAGNOSIS — Z923 Personal history of irradiation: Secondary | ICD-10-CM

## 2023-11-23 DIAGNOSIS — R1312 Dysphagia, oropharyngeal phase: Secondary | ICD-10-CM

## 2023-11-23 DIAGNOSIS — J3801 Paralysis of vocal cords and larynx, unilateral: Secondary | ICD-10-CM

## 2023-11-23 NOTE — Progress Notes (Signed)
 ENT Progress Note:   Update 11/23/2023  Discussed the use of AI scribe software for clinical note transcription with the patient, who gave verbal consent to proceed.  History of Present Illness Todd [name] is a 63 year old male with a history of neck dissection and radiation for SCCa of the oropharynx, who presents for f/u. He had CT neck recently which showed VF calcifications and we discussed that those are from Prolaryn Plus filler injection we did during his procedure.   He uses a suction machine at home to manage secretions, which has provided significant relief. He also uses Flonase  and a nasal rinse to manage nasal secretions, placing the suction machine in his mouth.  No recent illnesses, including pneumonia, and no significant weight changes. He is not currently eating by mouth. His caregiver mentions a recommendation from a nutritionist to explore clinical trials at [facility], particularly for head and neck cancer.   Records Reviewed:  Initial Evaluation  Update 05/25/23  He returns for f/u. Had FEES, which unfortunately showed aspiration and penetration. He is doing swallow therapy with SLP, doing water by mouth, rest of nutrition via PEG. No recent PNA. No fevers/chills.   Last visit 04/23/23  History of Present Illness   Here for f/u after DML b/l injection augmentation EGD and balloon dilation to 21 mm at the UES/PES level and right ear tube placement. Developed stridor and throat tightness POD#2 came in to ED, all sx resolved after steroids IV in ED. He reports significant improvement in hearing and reduction in ear pressure following the placement of a tube in the right ear. However, he now experiences similar symptoms in the left ear, and would like a tube on the left side as well.  The patient has been managing his symptoms with a feeding tube due to difficulties with swallowing. Despite the absence of strictures or esophageal issues on EGD, the patient has been unable  to consume food orally. The primary concern appears to be related to the base of the tongue and throat based on his swallow study and additional workup.   In addition to swallowing difficulties, the patient reports persistent nasal drainage. Despite the use of Flonase , the drainage continues, possibly due to incomplete palate closure (has hx of VPI)  The patient also has a history of acid reflux, managed with Protonix  and Famotidine .    Update 03/16/23: His Neurology f/u is scheduled and currently pending. He drinks shakes for pleasure, but still relies on PEG for nutrition and takes medications via PEG. He has had sensation of decreased hearing on the right side and some ear discomfort. No fevers, no admissions to the hospital.    Initial Evaluation 01/19/23 Reason for Consult: dysphagia hx of oropharyngeal cancer and cXRT  HPI: Todd [name] is an 63 y.o. male with hx of oropharyngeal SCCa stage IVB (T3N3M0) diagnosed 2017, s/p primary cXRT who is here for evaluation of severe dysphagia.  He was referred by Dr. Izell and his current SLP, due to significant decline in swallowing function approximately 1 year ago and the need to rely on PEG for nutrition support secondary to four episodes of aspiration pneumonia.  He was previously diagnosed and followed by Dr. Carlie ENT, who diagnosed his left oropharyngeal cancer, tonsil being primary site. He reports that approximately a year ago he started to have significant issues with his speech and swallowing.  Following primary chemoradiation for oropharyngeal cancer he was able to eat everything without problems, and his speech was normal.  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 Last spring approximately in April and May of 2024, he began to develop chills and fevers at home.  Oropharyngeal cancer recurrence was considered and he had PET/CT, neck MRI and chest CT. there was no evidence of  disease recurrence however imaging demonstrated bilateral pneumonia and borderline enlarged mediastinal nodes.  Since then he had 4 episodes of pneumonia which was thought to be due to aspiration.  He was seen by pulmonary Dr. Meade in May 2024.  Record review indicates that Dr. Meade advised him to see his speech therapist, and stated in his note from May of 17th at the most likely cause of bilateral pneumonia is aspiration.  Plan comes to his swallowing, initially he noticed that he began to choke on both liquids and solids approximately a year ago.  He would have mucus and productive cough. Denies dyspnea with PNA and would get chills and fevers. Then he would develop productive cough.  After repeat modified barium swallow in June, due to concern for recurrent aspiration pneumonia and significant weight loss, he underwent PEG placement.  He is consuming liquids such as water coffee and orange juice by mouth but supplements calories with boost through the PEG.  He also has a history of cervical myelopathy with bilateral numbness and tingling in his hands.  Had 2 neurosurgical consultations, locally and at [facility], and due to history of radiation was advised not to undergo procedure because the risks would be significant if ACDF was attempted.  Seeing neurology currently, although unclear what was the initial consult reason. Being f/b Neurology Dr Juliene Moores, at [facility], and has a scheduled appt 03/02/23     Records Reviewed: Multiple office notes reviewed most pertinent summarized below and in HPI MBS 10/30/2022 Clinical Impression: Clinical Impression: Patient presents with mild oral and severe pharyngo-cervical esophageal dysphagia presumed due to iatrogenic effects of XRT that is significantly worse compared to prior MBS conducted by this SLP in 2018.   SLP reviewed note from MBS at [facility] in 2023/11 but could not view images. Hypoglossal nerve deficit with impaired left  lingual motility and presence of fasciulations impacts oral control/strength.  Head tilt to the right attempted to aid oral transiting/control with partial effectiveness.  Pharyngeal swallow marked by severe hypomotility resulting in minimal transiting of barium into esophagus and laryngeal penetration/aspiration. Mulitple dry swallows *pt initiated* is helpful to decrease amount of retention but does not fuly clear it, even after 5-6 swallows with small single liquid bolus.  Head of chair reclined to approx 45* with slight neck extension did decrease amount of penetration and aspiration as gravity faciliated retention of barium in posterior pharynx.  Even in posterior position and with use of right tongue for oral transiting, pt penetrated very small bite of masticated cracker after the swallow as pharyngeal retention spilled into open airway.  Cued cough effectively cleared cracker into pharynx for pt to swallow with liquids.  Clearance into cervical esophagus was significantly impaired with gross retention of liquids as pyriform sinus.         At this time, pt is very high aspiration and malnutrition, dehydration risk with his current level of dysphagia.  Note plan is for PEG next week *11/07/2022  per pt and SLP agrees this is warranted.  SLP reviewed MBS extensively with pt and reinforced most effective compensation strategies.     Encouraged pt  to continue po with strict adherence to compensations including head of bed/chair reclined, chin elevated, multiple swallows per bolus, cough/throat clear and expectorate prn.      Question if he may benefit from intervention for PES opening to mitigate his dysphagia - advised he speak to Dr Carlie, ENT, re: potential.     Unfortunately Mr [facility] current dysphagia is profound and given progressive deficit (presumed from XRT effects, fibrosis) for 15 months, prognosis for swallow to return to functional level is guarded at best.    Advised pt consider ordering  antichoking device for emergent use.  Thanks so much for this consult.   Factors that may increase risk of adverse event in presence of aspiration Noe & Lianne 2021): Factors that may increase risk of adverse event in presence of aspiration Noe & Lianne 2021): Reduced saliva    Past Medical History:  Diagnosis Date   Acquired hypothyroidism    secondary to radiation //   followed by pcp   Arthritis    shoulders, neck, knees, feet   Bilateral sensorineural hearing loss    due to radiation   Decreased range of motion of neck    due to fibrotic changes of 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   Denervation atrophy of muscle    tongue ,  chronic ,  due to radiation injury   (neurologist-- dr aSABRA moores ([facility])  EMG in care everywhere done 03-11-2022   Frequency of urination    GERD (gastroesophageal reflux disease)    History of cancer chemotherapy 2017   tonsil cancer   03-24-2016  to 05-06-2016   History of external beam radiation therapy    03-24-2016  to 05-15-2016  Left Tonsil and Bilateral Neck  @ [facility]   HLD (hyperlipidemia)    Hoarseness, chronic    due to radiation   Hyperlipidemia, mixed    Hypertension    pt states he has not had HTN since June 2024 (he has not taken BP meds since then since his BP has been on the lower side)   Inguinal hernia, left    Lower facial weakness    intermittant left lower facial weakness   Numbness and tingling in left arm    intermittently per pt   Oropharyngeal dysphagia    hx neck radiation for left tonsil cancer ;  Speech therapy, regular diet with precautions swallows okay   Orthostatic hypotension    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   followed by ENT dr bates/  oncologist--- dr lonn;  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;  completed chemo 05-06-2016 and completed radiation of left tonsil/ bilateral neck 05-15-2016  w/ radiation injury to tongue (denervation chronic)   Wears glasses    Wears hearing aid  in both ears     Past Surgical History:  Procedure Laterality Date   INGUINAL HERNIA REPAIR Left 05/02/2022   Procedure: OPEN LEFT INGUINAL HERNIA REPAIR WITH MESH;  Surgeon: Signe Mitzie LABOR, MD;  Location: [facility];  Service: General;  Laterality: Left;   IR GASTROSTOMY TUBE MOD SED  11/07/2022   IR GASTROSTOMY TUBE REMOVAL  01/30/2017   IR GENERIC HISTORICAL  03/20/2016   IR US  GUIDE VASC ACCESS RIGHT 03/20/2016 Norleen Roulette, MD [facility]   IR GENERIC HISTORICAL  03/20/2016   IR FLUORO GUIDE PORT INSERTION RIGHT 03/20/2016 Norleen Roulette, MD [facility]   IR GENERIC HISTORICAL  03/20/2016   IR GASTROSTOMY TUBE MOD SED 03/20/2016 Norleen Roulette,  MD [facility]   IR REMOVAL TUN ACCESS W/ PORT W/O FL MOD SED  01/30/2017   IR REPLACE G-TUBE SIMPLE WO FLUORO  10/29/2023   IR REPLC GASTRO/COLONIC TUBE PERCUT W/FLUORO  04/30/2023   LARYNGOSCOPY WITH BRONCHOSCOPY AND BALLOON DILATION N/A 04/14/2023   Procedure: LARYNGOSCOPY WITH BRONCHOSCOPY AND BALLOON DILATION;  Surgeon: Okey Burns, MD;  Location: [facility];  Service: ENT;  Laterality: N/A;   MICROLARYNGOSCOPY W/VOCAL CORD INJECTION Bilateral 04/14/2023   Procedure: MICROLARYNGOSCOPY WITH VOCAL CORD INJECTION;  Surgeon: Okey Burns, MD;  Location: [facility];  Service: ENT;  Laterality: Bilateral;   MYRINGOTOMY WITH TUBE PLACEMENT Right 04/14/2023   Procedure: MYRINGOTOMY WITH TUBE PLACEMENT;  Surgeon: Okey Burns, MD;  Location: [facility];  Service: ENT;  Laterality: Right;   RIGID BRONCHOSCOPY N/A 04/14/2023   Procedure: RIGID BRONCHOSCOPY;  Surgeon: Okey Burns, MD;  Location: [facility];  Service: ENT;  Laterality: N/A;    Family History  Problem Relation Age of Onset   Dementia Father    Diabetes Father    Cancer Maternal Grandmother        unknown ca   Asthma Son     Social History:  reports that he has never smoked. He has never used smokeless tobacco. He reports that he does not currently use alcohol . He reports that  he does not use drugs.  Allergies:  Allergies  Allergen Reactions   Phenergan  [Promethazine  Hcl]     Got really nervous and shaky..   Bactrim [Sulfamethoxazole-Trimethoprim] Other (See Comments)    Flu like symptoms    Tamsulosin Other (See Comments)    Caused orthostatic hypotension per pt    Medications: I have reviewed the patient's current medications.  The PMH, PSH, Medications, Allergies, and SH were reviewed and updated.  ROS: Constitutional: Negative for fever, weight loss and weight gain. Cardiovascular: Negative for chest pain and dyspnea on exertion. Respiratory: Is not experiencing shortness of breath at rest. Gastrointestinal: Negative for nausea and vomiting. Neurological: Negative for headaches. Psychiatric: The patient is not nervous/anxious  Blood pressure 94/61, pulse 61, SpO2 92%.  PHYSICAL EXAM:  Exam: General: Well-developed, well-nourished Communication and Voice: Raspy Respiratory Respiratory effort: Equal inspiration and expiration without stridor Cardiovascular Peripheral Vascular: Warm extremities with equal color/perfusion Eyes: No nystagmus with equal extraocular motion bilaterally Neuro/Psych/Balance: Patient oriented to person, place, and time; Appropriate mood and affect; Gait is intact with no imbalance; 4 Cranial nerves: Previously seen tongue fasciculations but less pronounced today, left cranial nerve XII deficit, reduced soft palate movement on the left > right side likely due to fibrosis, VPI Head and Face Inspection: Normocephalic and atraumatic without mass or lesion Facial Strength: Facial motility symmetric and full bilaterally ENT Pinna: External ear intact and fully developed External canal: Canal is patent with intact skin Tympanic Membrane: right TM with T-tube, in good position and patent, no drainage, L TM normal no obvious air fluid level  External Nose: No scar or anatomic deformity Internal Nose: Septum is deviated to  the left. No polyp, or purulence. Mucosal edema and erythema present.  Bilateral inferior turbinate hypertrophy.  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 Indirect laryngoscopy view was incomplete. Thus it was recommended that they undergo a flexible fiberoptic laryngoscopy. All of the risks, benefits, and potential complications were reviewed with the patient preoperatively and verbal informed consent was obtained.  Procedure: The patient was seated upright in the clinic. Topical lidocaine  and Afrin were applied to the nasal cavity. After adequate anesthesia had occurred, I then proceeded to pass the flexible telescope into  the nasal cavity. The nasal cavity was patent without rhinorrhea or polyp. The nasopharynx was also patent without mass or lesion. The base of tongue was visualized and was normal. There were no signs of pooling of secretions in the piriform sinuses. The true vocal folds were mobile bilaterally. There were no signs of glottic or supraglottic mucosal lesion or mass. There was moderate interarytenoid pachydermia and post cricoid edema. The telescope was then slowly withdrawn and the patient tolerated the procedure throughout.   Studies Reviewed:CT chest 09/24/22 IMPRESSION: 1. Progressive diffuse peribronchovascular ground-glass, nodularity and consolidation with scattered septal thickening, findings which may be due to a worsening atypical/fungal infectious process. Lymphangitic carcinomatosis is not excluded. 2. Borderline enlarged mediastinal lymph nodes, possibly reactive. Recommend continued attention on follow-up as malignancy cannot be excluded. 3. Coronary artery calcification  MRI neck 09/15/22 MRI OF THE NECK WITH CONTRAST   TECHNIQUE: Multiplanar, multisequence MR imaging was performed following the administration of intravenous contrast.   CONTRAST:  7mL GADAVIST  GADOBUTROL  1 MMOL/ML IV SOLN   COMPARISON:  PET-CT 09/04/2022, CT neck 03/13/2021   FINDINGS: Pharynx and larynx: The nasal cavity and nasopharynx are unremarkable.   There is no abnormality in the tongue to correspond to the area of ill-defined radiotracer uptake seen on the recent PET-CT. There is no mass lesion or abnormal enhancement. There is no evidence of mass lesion or abnormal enhancement in the region of either tonsil. The oropharynx is unremarkable. Ill-defined T2 hyperintensity in the left parapharyngeal space with mild non masslike enhancement likely reflects posttreatment change, corresponding to ill-defined stranding seen on the CT neck going back to 2019.   The hypopharynx and larynx are  unremarkable. The vocal folds are normal in appearance   There is no retropharyngeal fluid collection.  The airway is patent.   Salivary glands: The parotid and submandibular glands are unremarkable.   Thyroid : Unremarkable.   Lymph nodes: There is no pathologic lymphadenopathy in the neck.   Vascular: The major flow voids are normal.   Limited intracranial: Unremarkable.   Visualized orbits: Unremarkable.   Mastoids and visualized paranasal sinuses: There is mucosal thickening in the maxillary sinuses, left worse than right. There is a new right mastoid effusion.   Skeleton: There is multilevel degenerative change of the cervical spine with disc space narrowing most advanced at C4-C5. There is moderate  spinal canal stenosis at C2-C3 through C4-C5.   Upper chest: The imaged lung apices are grossly unremarkable.   Other: None.   IMPRESSION: 1. No evidence of recurrent disease or pathologic lymphadenopathy in the neck. Specifically, there is no suspicious finding in the tongue to correspond to the focus of ill-defined radiotracer uptake on the recent PET-CT. NIRADS 1. 2. Right mastoid effusion, nonspecific.  4//25/24 PET/CT  IMPRESSION: 1. Asymmetric focus of increased radiotracer uptake is identified within the left posterior tongue. Etiology indeterminate. Cannot exclude recurrent tumor. Consider further evaluation with direct visualization and/or contrast enhanced soft tissue neck CT or MRI. 2. No tracer avid cervical lymph nodes. 3. Heterogeneous, multifocal asymmetric increased uptake is identified within the left lower lobe. On the corresponding nondiagnostic CT images there is extensive tree-in-bud nodularity, thickening of the peribronchovascular interstitium, mucoid impaction, and atelectasis/airspace consolidation. Imaging findings are more likely to reflect a inflammatory/infectious process. Suspect chronic recurrent aspiration. Advise clinical  correlation for signs/symptoms aspiration. Additionally, follow-up imaging with diagnostic CT of the chest in 3 months is recommended to ensure resolution and to exclude a central obstructing lesion which could also account for these changes. 4. Mildly tracer avid lymph nodes within the mediastinum and left hilum. In the setting of inflammation/infection these are likely reactive. Metastatic adenopathy is considered less favored but cannot be excluded with a high degree of certainty in this patient who has a history of known malignancy. Attention to these lymph nodes on follow-up imaging is advised. 5. Diffuse tree-in-bud nodularity is also identified within the basilar right upper lobe, right middle lobe and right lower lobe. This is compatible with an inflammatory/infectious bronchiolitis and may be seen with recurrent aspiration. 6. Coronary artery calcifications. 7. Prostate gland enlargement. 8. Asymmetric opacification of the left maxillary sinus. Correlate for any clinical signs or symptoms of sinusitis.   CT neck 04/06/23 reviewed - paranasal sinuses appear clear  CT neck and chest 04/06/23 - no evidence of disease recurrence  IMPRESSION: 1. No CT evidence of local recurrence or metastatic disease in the neck. 2. Chronic radiation related changes on the left. No adenopathy in the neck. 3. 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 This has been seen previously. 4. Cardiomegaly with LAD coronary artery calcification. 5. Chronic subpleural fibrosis in the anterior lung apices probably relates to XRT change. 6. Since last CT, interval clearing of the upper and right middle lobes, and significant improvement in the lower lobes, with resolution of tree-in-bud and consolidative change on the left and mild patchy residual tree-in-bud changes in the  right lower lobe consistent with residual pneumonitis/small airways disease or microaspiration. 7. Thickened bands of consolidation in the posterior lower lobes, probably atelectatic, alternatively could be due to small areas of pneumonia. 8. New 6 mm subpleural right lower lobe nodule and 4 mm pleural-based nodule in the apex of the superior segment of the right lower lobe. Attention on follow-up scan recommended. 9. PEG tube in the body of the stomach. 10. Cholelithiasis. 11. Hepatic steatosis with stable chronic hypodensities in segment  FEES 05/11/23 Pt appears to have improved adduction of his vocal folds, with some movement noted on his L side. Pt says his glottis was wide open but his vocal folds appear to approximate especially on anterior end. When cued to hold his breath and bear down, he approximates his fasle vocal folds, completely obstructing view of his true vocal  folds.    Oral phase is not directly observed during FEES, but pt reports limited lingual transit as consistencies get thicker. He needed to use liquid washes to try to swallow a small amount of puree.   Pt's pharyngeal phase of swallowing is still heavily impacted by generalized hypomobility, and aspiration still occurs across consistencies. Although likely similar to MBS in June in many regards, suspect that his improved ability to adduct his cords may help some with expectoration. A combination of a reclined position and a super supraglottic swallow maneuver helps to increase airway protection during the swallow. Residue (especially in the pyriform sinuses) spills over the arytenoids and into his airway after the swallow as well. He has similar amounts of residue given thin and nectar thick liquids, and he is able to cough some of the penetrated/aspirated material back out of his airway. Aspiration is not fully cleared, as he does have some productive coughing throughout the study that has evidence of the green food  coloring used for this study. He seems to have increased difficulty clearing his airway and his pharynx with honey thick liquids and purees.    When allowed to self-regulate intake with thin liquids while using recommended strategies, he does seem to clear a lot (but not all) from his airway. He clears more this way than he does when taking single ice chips. Discussed results with pt and wife, who was able to watch this study as well. Would continue to at least consume water after oral care as he has been doing, along with HEP given by OP SLP. Encouraged him to try to start encorporating the super supraglottic swallow as well. We discussed the potential to advance to some other liquids outside of just water at the discretion of his primary team (SLP, ENT, etc.), maybe as he starts to feel back to his baseline from recent medical events. Education was provided on risk factors, outside of aspiration, that impact the risk for adverse events to occur and trying to optimize other areas (oral care, physical mobility, GI and pulmonary status, etc.) given known aspiration.  Assessment/Plan: No diagnosis found.    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  Had total of 4 aspiration pneumonia episodes and was seen by pulmonary with recommendation to follow with SLP, and limit antibiotic use.  Repeat MBS in June 2024 with evidence of pharyngeal dysphagia and aspiration.  Underwent PEG placement, and working with speech, liquid only intake by mouth and boost supplementation via PEG.  He reports significant decline in speech and swallowing about a year ago, and prior to that had normal speech and was tolerating regular diet without issues.   On my exam today I was able to do evaluation of UES/upper esophagus  and post-cricoid area in the office advancing flexible scope passed postcricoid area- no stricture seen.  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  There was significant pooling of secretions along the vallecula bilaterally.  There was evidence of left cranial nerve XII deficit, and diffuse tongue fasciculations.   I discussed exam findings with the patient.  My main concern is due to acute worsening of both speech/tongue mobility and  evidence of diffuse tongue fasciculations, neurologic condition such as ALS needs to be excluded.  Other neurologic processes such as those affecting bulbar function also have to be ruled out.  Patient's severe pharyngeal dysphagia could still be related to sequela of radiation, but acute changes in his swallowing function are certainly suspicious for another cause.   I advised the patient to see his neurologist Dr Rawland at [facility] in October as scheduled and will communicate with his neurologist to ensure above concerns are addressed during his follow-up  Will consider procedural interventions in the future based on neurology evaluation and for further workup.  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  He will return after follow-up with his neurologist.  Will do videostrobe when he returns to determine if left vocal fold paralysis results in severe glottic insufficiency.   Update 04/23/23  S/p DML b/l injection augmentation EGD and balloon dilation to 21 mm at the UES/PES level and right ear tube placement 1 week ago. Developed stridor and throat tightness POD#2 came in to ED, all sx resolved after steroids IV in ED. He reports  significant improvement in hearing and reduction in ear pressure following the placement of a tube in the right ear. However, he now experiences similar symptoms in the left ear, and would like a tube on the left side as well. Weight is stable. CT neck and chest with improvement of lung findings that were previously concerning for aspiration. Still on TEN via PEG.    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 Has FEES scheduled in 2 weeks and working with SLP - videostrobe today with correction of glottic gap post injection augmentation and evidence of thick yellow secretions in nasopharynx and along nasopharyngeal wall, likely 2/2 VPI and inability to blow his nose efficiently - Nasal saline rinses with NeilMed Bottle  - already given Augmentin  before without any change, will hold off on further abx since he did finish a course we gave him post-op recently along with steroids and burden of yellow secretions does appear to be reduced on exam today   2. Chronic nasal congestion and right sided middle ear effusion - nasal saline rinses with NeilMed - Flonase  2 puffs b/l nares BID  3. Chronic dysphagia and aspiration PEG/Dependent  -We discussed at length that a lot of his swallowing problems are due to postradiation side effects.   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  Eustachian tube dysfunction right side, recurrent issue per patient.  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 Left ear-  Normal to severe sensorineural hearing loss from 250 Hz - 8000 Hz - he would like to consider ear tube for left side, did  not have fluid on exam today - will decide when he returns   5. Chronic GERD LPR - continue Protonix  40 mg Qam and Pepcid  20 mg at bedtime  F/u - return after FEES and visit with SLP January 2024   Update 05/25/23 Assessment and Plan    Dysphagia with Aspiration Chronic dysphagia with aspiration due to muscle weakness in the tongue and pharynx, post-XRT for stage IV oropharyngeal cancer. Able to consume water and pills by mouth, otherwise PEG dependent. Recent dilation procedure was ineffective. At risk of aspiration pneumonia. Discussed potential benefits and risks of introducing small amounts of other consistencies of liquids or foods, such as Ensure or lemon yogurt, to potentially strengthen swallowing muscles and improve quality of life. Emphasized importance of oral hygiene, regular exercise, and adherence to swallowing precautions to minimize aspiration risk. 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 We discussed that filler typically lasts 9-12 months. Reports improved breathing and cough strength. Discussed possibility of filler resorption over time and potential need for another injection if symptoms worsen. - Monitor for signs of filler resorption - Consider repeat vocal cord injection if symptoms worsen  Radiation-Induced Xerostomia Experiences throat dryness due to radiation therapy affecting salivary glands, contributing to difficulty in clearing secretions. Suggested steaming treatments or nebulizer  with saline to help clear secretions. - Consider steaming treatments or nebulizer with salt water  Eustachian Tube Dysfunction Eustachian tube dysfunction improved following ear tube placement on the right side. Reports improved hearing and no longer needing to increase TV volume. Wears hearing aids and it seems to help - No further intervention needed at this time  General Health Maintenance Emphasized importance of maintaining overall health through regular exercise and oral hygiene to reduce the risk of illness and aspiration pneumonia. - Maintain regular exercise - Maintain oral hygiene  Update 11/23/2023 Assessment and Plan Assessment & Plan SCCa of the oropharynx, s/p cXRT 2017  Marginal mandibular nerve weakness on exam, minimal  Suspected due to previous neck dissection or radiation therapy.  - continue to monitor   Recent CT neck with calcifications along VF Calcifications are from calcium hydroxyapatite filler injection we performed during his surgery. No evidence of recurrence - patient was reassured   Secretion management Appears to be doing better today, scope exam with minimal secretions along vallecula, otherwise no pooling of secretions - Continue Flonase  and nasal rinse. - Use suction machine as needed. - Maintain oral hygiene to prevent aspiration PNA     Elena Larry, MD Otolaryngology [facility] Phone: [phone] Fax: [phone]  11/23/2023, 2:17 PM

## 2023-12-10 DIAGNOSIS — C090 Malignant neoplasm of tonsillar fossa: Secondary | ICD-10-CM | POA: Diagnosis not present

## 2023-12-16 DIAGNOSIS — Z85818 Personal history of malignant neoplasm of other sites of lip, oral cavity, and pharynx: Secondary | ICD-10-CM | POA: Diagnosis not present

## 2023-12-16 DIAGNOSIS — Z931 Gastrostomy status: Secondary | ICD-10-CM | POA: Diagnosis not present

## 2023-12-16 DIAGNOSIS — Y842 Radiological procedure and radiotherapy as the cause of abnormal reaction of the patient, or of later complication, without mention of misadventure at the time of the procedure: Secondary | ICD-10-CM | POA: Diagnosis not present

## 2023-12-16 DIAGNOSIS — R634 Abnormal weight loss: Secondary | ICD-10-CM | POA: Diagnosis not present

## 2023-12-21 DIAGNOSIS — Y842 Radiological procedure and radiotherapy as the cause of abnormal reaction of the patient, or of later complication, without mention of misadventure at the time of the procedure: Secondary | ICD-10-CM | POA: Diagnosis not present

## 2023-12-21 DIAGNOSIS — Z85818 Personal history of malignant neoplasm of other sites of lip, oral cavity, and pharynx: Secondary | ICD-10-CM | POA: Diagnosis not present

## 2023-12-21 DIAGNOSIS — R1310 Dysphagia, unspecified: Secondary | ICD-10-CM | POA: Diagnosis not present

## 2023-12-22 ENCOUNTER — Inpatient Hospital Stay: Attending: Hematology and Oncology | Admitting: Dietician

## 2023-12-22 NOTE — Progress Notes (Signed)
 Nutrition Follow-up:  Patient completed concurrent chemoradiation for tonsil cancer. Final radiation 05/15/16 under the care of Dr. Izell. Patient now with dysphagia and recurrent aspiration pneumonia. S/p DML injection and EGD with balloon dilation on 12/3 under the care of Dr. Okey.    Tube exchange 12/19 DME: [employer] FEES on 05/25/23 showed aspiration and penetration   Met with patient and wife in office. Patient reports doing well. He is active at home, enjoys working in the yard. Patient continues to tolerate Boost VHC. He was appreciative of [employer] samples provided. Says they are very thick, requiring a lot of water to thin but liked having something different. He denies nausea, vomiting, diarrhea, constipation. Continues to have thick mucous that coats throat. Patient is using saline nasal spray + suction machine which works well.    Medications: reviewed   Labs: no new labs   Anthropometrics: Wt 169.0 lb in RD office today - decreased ~3% in 9 weeks which is insignificant for time frame  6/10 - 174 lb 9.6 oz   NUTRITION DIAGNOSIS: Inadequate oral intake - PEG dependent    MALNUTRITION DIAGNOSIS: Severe malnutrition resolved    INTERVENTION:  Pt is PEG dependent for nutrition/hydration/medications Continue Boost VHC - one carton QID Patient provided samples of [employer] Blend for variety - he will give 2 packets in place of 1 Boost VHC Continue 2L free water with flushes + in between bolus feeds    MONITORING, EVALUATION, GOAL: wt trends, TF   NEXT VISIT: Friday October 3 in office

## 2024-01-21 DIAGNOSIS — Y842 Radiological procedure and radiotherapy as the cause of abnormal reaction of the patient, or of later complication, without mention of misadventure at the time of the procedure: Secondary | ICD-10-CM | POA: Diagnosis not present

## 2024-01-21 DIAGNOSIS — Z85818 Personal history of malignant neoplasm of other sites of lip, oral cavity, and pharynx: Secondary | ICD-10-CM | POA: Diagnosis not present

## 2024-01-21 DIAGNOSIS — R1310 Dysphagia, unspecified: Secondary | ICD-10-CM | POA: Diagnosis not present

## 2024-02-03 DIAGNOSIS — R7301 Impaired fasting glucose: Secondary | ICD-10-CM | POA: Diagnosis not present

## 2024-02-03 DIAGNOSIS — I129 Hypertensive chronic kidney disease with stage 1 through stage 4 chronic kidney disease, or unspecified chronic kidney disease: Secondary | ICD-10-CM | POA: Diagnosis not present

## 2024-02-03 DIAGNOSIS — Z85818 Personal history of malignant neoplasm of other sites of lip, oral cavity, and pharynx: Secondary | ICD-10-CM | POA: Diagnosis not present

## 2024-02-03 DIAGNOSIS — R634 Abnormal weight loss: Secondary | ICD-10-CM | POA: Diagnosis not present

## 2024-02-03 DIAGNOSIS — Y842 Radiological procedure and radiotherapy as the cause of abnormal reaction of the patient, or of later complication, without mention of misadventure at the time of the procedure: Secondary | ICD-10-CM | POA: Diagnosis not present

## 2024-02-03 DIAGNOSIS — Z125 Encounter for screening for malignant neoplasm of prostate: Secondary | ICD-10-CM | POA: Diagnosis not present

## 2024-02-10 DIAGNOSIS — Z1331 Encounter for screening for depression: Secondary | ICD-10-CM | POA: Diagnosis not present

## 2024-02-10 DIAGNOSIS — R82998 Other abnormal findings in urine: Secondary | ICD-10-CM | POA: Diagnosis not present

## 2024-02-10 DIAGNOSIS — I129 Hypertensive chronic kidney disease with stage 1 through stage 4 chronic kidney disease, or unspecified chronic kidney disease: Secondary | ICD-10-CM | POA: Diagnosis not present

## 2024-02-10 DIAGNOSIS — Z0000 Encounter for general adult medical examination without abnormal findings: Secondary | ICD-10-CM | POA: Diagnosis not present

## 2024-02-10 DIAGNOSIS — Z1339 Encounter for screening examination for other mental health and behavioral disorders: Secondary | ICD-10-CM | POA: Diagnosis not present

## 2024-02-12 ENCOUNTER — Inpatient Hospital Stay: Attending: Hematology and Oncology | Admitting: Dietician

## 2024-02-12 NOTE — Progress Notes (Signed)
 Nutrition Follow-up:  Patient completed concurrent chemoradiation for tonsil cancer. Final radiation 05/15/16 under the care of Dr. Izell. Patient now with dysphagia and recurrent aspiration pneumonia. S/p DML injection and EGD with balloon dilation on 12/3 under the care of Dr. Okey.    Tube exchange 12/19 DME: [employer] FEES on 05/25/23 showed aspiration and penetration   Met with patient and wife in office. He is doing well. Patient returned real food blends samples. These were too thick for tube feedings. Pt would water it down, however this required using plunger and took too much time. He has started making his own protein shake with chocolate protein powder (30g) with milk. Patient giving one of these + 3 cartons Boost via tube. Weights are stable. He denies nausea, vomiting, diarrhea, constipation.   Medications: reviewed   Labs: no new labs   Anthropometrics: Wt 170.2 lb today in RD office   8/12 - 169.0 lb  6/10 - 174 lb 9.6 oz    NUTRITION DIAGNOSIS: Inadequate oral intake - PEG dependent    MALNUTRITION DIAGNOSIS: Severe malnutrition resolved    INTERVENTION:  Continue 3 Boost VHC + 1 protein shake (milk + protein powder)  Continue 4 bottles of water/day via tube with flushes and in between bolus feedings    MONITORING, EVALUATION, GOAL: wt trends, PEG   NEXT VISIT: Tuesday December 9 before MD visit

## 2024-02-20 DIAGNOSIS — R1310 Dysphagia, unspecified: Secondary | ICD-10-CM | POA: Diagnosis not present

## 2024-02-20 DIAGNOSIS — Y842 Radiological procedure and radiotherapy as the cause of abnormal reaction of the patient, or of later complication, without mention of misadventure at the time of the procedure: Secondary | ICD-10-CM | POA: Diagnosis not present

## 2024-02-20 DIAGNOSIS — R634 Abnormal weight loss: Secondary | ICD-10-CM | POA: Diagnosis not present

## 2024-02-20 DIAGNOSIS — Z85818 Personal history of malignant neoplasm of other sites of lip, oral cavity, and pharynx: Secondary | ICD-10-CM | POA: Diagnosis not present

## 2024-03-04 DIAGNOSIS — R634 Abnormal weight loss: Secondary | ICD-10-CM | POA: Diagnosis not present

## 2024-03-04 DIAGNOSIS — Z85818 Personal history of malignant neoplasm of other sites of lip, oral cavity, and pharynx: Secondary | ICD-10-CM | POA: Diagnosis not present

## 2024-03-04 DIAGNOSIS — Z931 Gastrostomy status: Secondary | ICD-10-CM | POA: Diagnosis not present

## 2024-03-04 DIAGNOSIS — Y842 Radiological procedure and radiotherapy as the cause of abnormal reaction of the patient, or of later complication, without mention of misadventure at the time of the procedure: Secondary | ICD-10-CM | POA: Diagnosis not present

## 2024-03-07 ENCOUNTER — Telehealth (INDEPENDENT_AMBULATORY_CARE_PROVIDER_SITE_OTHER): Payer: Self-pay

## 2024-03-07 NOTE — Telephone Encounter (Signed)
 Left vm to r/s 05/25/24 appointment

## 2024-04-14 NOTE — Progress Notes (Incomplete)
 Pain issues, if any: *** Using a feeding tube?: *** Weight changes, if any: *** Swallowing issues, if any: *** Smoking or chewing tobacco? *** Using fluoride toothpaste daily? *** Last ENT visit was on: *** Other notable issues, if any: ***

## 2024-04-19 ENCOUNTER — Inpatient Hospital Stay: Admitting: Dietician

## 2024-04-19 ENCOUNTER — Ambulatory Visit: Admitting: Radiation Oncology

## 2024-04-19 NOTE — Progress Notes (Incomplete)
 Radiation Oncology         [phone] ________________________________  Name: Todd [name] MRN: [id number]  Date: 04/19/2024  DOB: 1961-02-09  Follow-Up Visit Note  Outpatient  CC: [name], Todd ORN, MD  [name], Todd ORN, MD  Diagnosis and Prior Radiotherapy:  C09.0 No diagnosis found.    Cancer Staging  Tonsil cancer [facility]) Staging form: Pharynx - Oropharynx, AJCC 7th Edition - Clinical stage from 02/25/2016: Stage IVB (T3, N3, M0) - Signed by Lonn Hicks, MD on 02/27/2016 Staged by: Managing physician Diagnostic confirmation: Positive histology Specimen type: Aspirate Histopathologic type: Squamous cell carcinoma, NOS Laterality: Bilateral  CHIEF COMPLAINT:  swallowing issues, post-radiation treatment.   Narrative:  Todd [name] presents for follow up and to review recent imaging. He completed his radiation treatment to his tonsillar fossa on 05/15/16.     ***            ALLERGIES:  is allergic to phenergan  [promethazine  hcl], bactrim [sulfamethoxazole-trimethoprim], and tamsulosin.  Meds: Current Outpatient Medications  Medication Sig Dispense Refill   acetaminophen  (TYLENOL ) 500 MG tablet Place 1,000 mg into feeding tube daily. LIQUID FORM ONLY     cetirizine (ZYRTEC) 10 MG tablet Place 10 mg into feeding tube daily.     Coenzyme Q10 (COQ-10) 200 MG CAPS Give 200 mg by tube daily. (Patient not taking: Reported on 11/23/2023)     famotidine  (PEPCID ) 20 MG tablet Place 20 mg into feeding tube at bedtime. (Patient not taking: Reported on 11/23/2023)     fenofibrate (TRICOR) 145 MG tablet Take 145mg  tablet per tube every evening     fluticasone  (FLONASE ) 50 MCG/ACT nasal spray Place 2 sprays into both nostrils daily. (Patient not taking: Reported on 11/23/2023) 16 g 6   glucosamine-chondroitin 500-400 MG tablet Take 1 tablet by mouth every morning. Chewables     levothyroxine  (SYNTHROID ) 125 MCG tablet Place 125 mcg into feeding tube every evening.     meloxicam  (MOBIC )  7.5 MG tablet Take 1 tablet (7.5 mg total) by mouth daily as needed for pain. (Patient not taking: Reported on 11/23/2023)     methylPREDNISolone  (MEDROL  DOSEPAK) 4 MG TBPK tablet Take with signs of chronic sinusitis and take as directed (Patient not taking: Reported on 11/23/2023) 1 each 1   pantoprazole  (PROTONIX ) 40 MG tablet Crush 1 tablet and place in tube daily (Patient not taking: Reported on 11/23/2023) 60 tablet 0   No current facility-administered medications for this visit.    Physical Findings:    The patient is in no acute distress. Patient is alert and oriented.  vitals were not taken for this visit. .     General: Alert and oriented, in no acute distress, hoarse, speech is somewhat slurred. HEENT: Head is normocephalic. Extraocular movements are intact. Oropharynx is clear of tumor but the oral tongue is notable for fasciculations and atrophy and his tongue deviates slightly to the left.   Neck: Neck is supple, no palpable cervical or supraclavicular lymphadenopathy.  Fibrosis noted bilaterally. Lymphatics: see Neck Exam Skin: He has some scabbed lesions over the left neck without any vesicles, not a clinical concern Neurologic: Ambulatory.  Alert and oriented x 3.  See HEENT Psychiatric: Judgment and insight are intact. Affect is appropriate. Heart is regular in rate and rhythm Chest clear to auscultation bilaterally Abdomen notable for PEG tube, soft nontender nondistended Musculoskeletal exam reveals that he is well-nourished, ambulatory  Lab Findings: Lab Results  Component Value Date   WBC 5.4 04/15/2023   HGB  13.3 04/15/2023   HCT 40.8 04/15/2023   MCV 89.7 04/15/2023   PLT 311 04/15/2023    Radiographic Findings: No results found.   Impression/Plan:   This is a patient well-known by me with a history of locally advanced oropharyngeal cancer treated in 2017.  From an oncologic standpoint he is cured and we will continue imaging as clinically indicated in the  future if concerning symptoms arise, otherwise we will just continue physical exams.  His imaging today shows no evidence of disease.  He does have calcifications in his vocal folds.  I am not sure if this is in any way related to a recent Botox injection.  He will talk to Dr. Soldatova about this.  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  Based on my review of the literature and workup by his neurologist, this seems entirely possible.  The patient is relieved that it is less likely that he has ALS.  He feels that his symptoms overall are stable to improved.   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  We will order Yankauer suction device and home health to help with his thick secretions  I will see him back in 6 months.  Sooner as needed.  He is pleased with this plan.  On date of service, in total, I spent 40 minutes on this encounter. Patient was seen in person.   Note signed after encounter date; minutes pertain to date of service, only.   ____________________________   Lauraine Golden, MD

## 2024-04-20 ENCOUNTER — Telehealth: Payer: Self-pay | Admitting: Radiation Oncology

## 2024-04-20 NOTE — Telephone Encounter (Signed)
 12/10 @ 8:44 am Left voicemail on both patient and patient's spouse contact numbers for patient to call our office to be reschedule for missed f/u appt from 12/9.

## 2024-04-21 ENCOUNTER — Telehealth: Payer: Self-pay | Admitting: Radiation Oncology

## 2024-04-21 NOTE — Telephone Encounter (Signed)
 12/11 @ 10:52 am Left voicemail for patient to call our office to be r/s for his missed appt from 12/9.

## 2024-04-22 ENCOUNTER — Telehealth: Payer: Self-pay | Admitting: Radiation Oncology

## 2024-04-22 NOTE — Telephone Encounter (Signed)
 12/12 Left voicemail for patient to call our office to confirm date/time of reschedule follow up appt from 12/9, also sent appt reminder to patient's residence.

## 2024-04-26 ENCOUNTER — Ambulatory Visit (HOSPITAL_COMMUNITY)
Admission: RE | Admit: 2024-04-26 | Discharge: 2024-04-26 | Disposition: A | Discharge status: Home / Self Care | Source: Ambulatory Visit | Attending: Radiology | Admitting: Radiology

## 2024-04-26 ENCOUNTER — Ambulatory Visit: Admitting: Radiation Oncology

## 2024-04-26 ENCOUNTER — Other Ambulatory Visit (HOSPITAL_COMMUNITY): Payer: Self-pay | Admitting: Interventional Radiology

## 2024-04-26 DIAGNOSIS — C099 Malignant neoplasm of tonsil, unspecified: Secondary | ICD-10-CM

## 2024-04-26 DIAGNOSIS — Z431 Encounter for attention to gastrostomy: Secondary | ICD-10-CM | POA: Diagnosis not present

## 2024-04-26 HISTORY — PX: IR REPLACE G-TUBE SIMPLE WO FLUORO: IMG2323

## 2024-04-27 ENCOUNTER — Telehealth: Payer: Self-pay | Admitting: Radiation Oncology

## 2024-04-27 ENCOUNTER — Ambulatory Visit
Admission: RE | Admit: 2024-04-27 | Discharge: 2024-04-27 | Disposition: A | Discharge status: Home / Self Care | Source: Ambulatory Visit | Attending: Radiation Oncology | Admitting: Radiation Oncology

## 2024-04-27 VITALS — BP 140/91 | HR 67 | Temp 97.1°F | Resp 18 | Ht 69.0 in | Wt 174.2 lb

## 2024-04-27 DIAGNOSIS — R1310 Dysphagia, unspecified: Secondary | ICD-10-CM | POA: Diagnosis not present

## 2024-04-27 DIAGNOSIS — E039 Hypothyroidism, unspecified: Secondary | ICD-10-CM | POA: Diagnosis not present

## 2024-04-27 DIAGNOSIS — C090 Malignant neoplasm of tonsillar fossa: Secondary | ICD-10-CM

## 2024-04-27 DIAGNOSIS — M5030 Other cervical disc degeneration, unspecified cervical region: Secondary | ICD-10-CM | POA: Diagnosis not present

## 2024-04-27 DIAGNOSIS — Z923 Personal history of irradiation: Secondary | ICD-10-CM | POA: Diagnosis not present

## 2024-04-27 DIAGNOSIS — Z85819 Personal history of malignant neoplasm of unspecified site of lip, oral cavity, and pharynx: Secondary | ICD-10-CM | POA: Diagnosis present

## 2024-04-27 DIAGNOSIS — R471 Dysarthria and anarthria: Secondary | ICD-10-CM | POA: Insufficient documentation

## 2024-04-27 NOTE — Progress Notes (Signed)
 Radiation Oncology         [phone] ________________________________  Name: Todd [name] MRN: [id number]  Date: 04/27/2024  DOB: Aug 08, 1960  Follow-Up Visit Note  Outpatient  CC: Tisovec, Charlie ORN, MD  Tisovec, Charlie ORN, MD  Diagnosis and Prior Radiotherapy:  C09.0   ICD-10-CM   1. Malignant neoplasm of tonsillar fossa (HCC)  C09.0         Cancer Staging  Tonsil cancer [facility]) Staging form: Pharynx - Oropharynx, AJCC 7th Edition - Clinical stage from 02/25/2016: Stage IVB (T3, N3, M0) - Signed by Lonn Hicks, MD on 02/27/2016 Staged by: Managing physician Diagnostic confirmation: Positive histology Specimen type: Aspirate Histopathologic type: Squamous cell carcinoma, NOS Laterality: Bilateral     Radiation treatment dates:   03/24/16 - 05/15/16   Site/dose:     Left Tonsil and Bilateral Neck / 70 Gy in 35 fractions to gross disease, 63 Gy in 35 fractions to high risk nodal echelons, and 56 Gy in 35 fractions to intermediate risk nodal echelons   Beams/energy:   Helical IMRT / 6 MV photons    CHIEF COMPLAINT:  swallowing issues, post-radiation treatment.   Narrative:  [name] presents for follow up and to review recent imaging. He completed his radiation treatment to his tonsillar fossa on 05/15/16.   Pain issues, if any: None Using a feeding tube?: Yes, has a feeding tube for nutrition. Feeding tube was replaced yesterday. Weight changes, if any: No recent weight loss. Weight has stayed stable around 170 to 174. Swallowing issues, if any: Yes, having some swallowing issues  Smoking or chewing tobacco? None Using fluoride  toothpaste daily? Yes Last ENT visit was on: July 2025 with Dr. Soldatova Other notable issues, if any: Stable overall  ALLERGIES:  is allergic to phenergan  [promethazine  hcl], bactrim [sulfamethoxazole-trimethoprim], and tamsulosin.  Meds: Current Outpatient Medications  Medication Sig Dispense Refill   acetaminophen  (TYLENOL ) 500 MG  tablet Place 1,000 mg into feeding tube daily. LIQUID FORM ONLY     cetirizine (ZYRTEC) 10 MG tablet Place 10 mg into feeding tube daily.     Coenzyme Q10 (COQ-10) 200 MG CAPS Give 200 mg by tube daily. (Patient not taking: Reported on 11/23/2023)     famotidine  (PEPCID ) 20 MG tablet Place 20 mg into feeding tube at bedtime. (Patient not taking: Reported on 11/23/2023)     fenofibrate (TRICOR) 145 MG tablet Take 145mg  tablet per tube every evening     fluticasone  (FLONASE ) 50 MCG/ACT nasal spray Place 2 sprays into both nostrils daily. (Patient not taking: Reported on 11/23/2023) 16 g 6   glucosamine-chondroitin 500-400 MG tablet Take 1 tablet by mouth every morning. Chewables     levothyroxine  (SYNTHROID ) 125 MCG tablet Place 125 mcg into feeding tube every evening.     meloxicam  (MOBIC ) 7.5 MG tablet Take 1 tablet (7.5 mg total) by mouth daily as needed for pain. (Patient not taking: Reported on 11/23/2023)     methylPREDNISolone  (MEDROL  DOSEPAK) 4 MG TBPK tablet Take with signs of chronic sinusitis and take as directed (Patient not taking: Reported on 11/23/2023) 1 each 1   pantoprazole  (PROTONIX ) 40 MG tablet Crush 1 tablet and place in tube daily (Patient not taking: Reported on 11/23/2023) 60 tablet 0   No current facility-administered medications for this encounter.    Physical Findings:    The patient is in no acute distress. Patient is alert and oriented.  height is 5' 9 (1.753 m) and weight is 174 lb 4 oz (79  kg). His temporal temperature is 97.1 F (36.2 C) (abnormal). His blood pressure is 140/91 (abnormal) and his pulse is 67. His respiration is 18 and oxygen saturation is 96%. .     General: Alert and oriented, in no acute distress,  HEENT: Head is normocephalic. Extraocular movements are intact. Oropharynx is clear of tumor.  Less fasciculations noted in his oral tongue than previous exam.  He has difficulty moving his tongue outward.  Today his tongue is relatively midline Neck:  no  palpable cervical or supraclavicular lymphadenopathy.  Fibrosis noted bilaterally. Lymphatics: see Neck Exam Skin: No concerning lesions Neurologic: Ambulatory.  Alert and oriented x 3.  See HEENT.  Dysarthria and hoarseness noted. Psychiatric: Judgment and insight are intact. Affect is appropriate. Heart is regular in rate and rhythm Chest clear to auscultation bilaterally Abdomen notable for PEG tube, soft nontender nondistended Musculoskeletal exam reveals that he is well-nourished, ambulatory  Lab Findings: Lab Results  Component Value Date   WBC 5.4 04/15/2023   HGB 13.3 04/15/2023   HCT 40.8 04/15/2023   MCV 89.7 04/15/2023   PLT 311 04/15/2023    Radiographic Findings: IR REPLACE G-TUBE SIMPLE WO FLUORO Result Date: 04/26/2024 INDICATION: 64 year old male with chronic indwelling percutaneous gastrostomy tube. EXAM: REPLACEMENT OF GASTROSTOMY TUBE COMPARISON:  04/30/2023, 10/29/2023 MEDICATIONS: None. CONTRAST:  None. FLUOROSCOPY TIME:  None. COMPLICATIONS: None immediate. PROCEDURE: Informed written consent was obtained from the patient after a discussion of the risks, benefits and alternatives to treatment. Questions regarding the procedure were encouraged and answered. A timeout was performed prior to the initiation of the procedure. The upper abdomen and external portion of the existing gastrostomy tube was prepped and draped in the usual sterile fashion, and a sterile drape was applied covering the operative field. Maximum barrier sterile technique with sterile gowns and gloves were used for the procedure. A timeout was performed prior to the initiation of the procedure. The existing gastrostomy tube was removed and exchanged for a new 22-French balloon inflatable gastrostomy tube. The balloon was inflated and disc was cinched. A dressing was placed. The patient tolerated the procedure well without immediate postprocedural complication. IMPRESSION: Successful replacement of a new  22-French gastrostomy tube. The new gastrostomy tube is ready for immediate use. Ester Sides, MD [facility] Electronically Signed   By: Ester Sides M.D.   On: 04/26/2024 15:13     Impression/Plan:   This is a patient well-known by me with a history of locally advanced oropharyngeal cancer treated in 2017.  From an oncologic standpoint he is cured and we will continue imaging as clinically indicated in the future if concerning symptoms arise, otherwise we will just continue physical exams.  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    Based on my review of the literature and workup by his neurologist, this seems entirely possible.  The patient is relieved that it is less likely that he has ALS.  He feels that his symptoms overall are stable to improved.   He looks relatively good today and is still staying on top of his nutrition with his feeding tube. He is coping with dysarthria and dysphagia.  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  He will return to radiation oncology for follow-up in 12 months.  We will refer him to survivorship as he and his wife are interested in benefiting from that program.  He may be a good candidate for carotid Doppler ultrasounds given his history of radiation to his neck.  He would benefit from a survivorship care plan.  He would like to meet with survivorship and have this care plan made before he establishes care with a new ENT, Dr. Anice.     Given his difficulty speaking he is interested in getting a special tablet that may help put his words on the screen.  I reached out to our speech-language pathologist to see if they have  any recommendations  On date of service, in total, I spent 45 minutes on this encounter. Patient was seen in person.      ____________________________   Lauraine Golden, MD

## 2024-04-27 NOTE — Telephone Encounter (Signed)
 12/17 Left voicemail to confirm date/time of follow up appt.

## 2024-04-27 NOTE — Progress Notes (Addendum)
 Mr. Eguia presents today in the clinic for a follow up. 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   Pain issues, if any: None Using a feeding tube?: Yes, has a feeding tube for nutrition. Feeding tube was replaced yesterday. Weight changes, if any: No recent weight loss. Weight has stayed stable around 170 to 174. Swallowing issues, if any: Yes, having some swallowing issues  Smoking or chewing tobacco? None Using fluoride  toothpaste daily? Yes Last ENT visit was on: July 2025 with Dr. Okey Other notable issues, if any: None   BP (!) 140/91 (BP Location: Right Leg, Patient Position: Sitting)   Pulse 67   Temp (!) 97.1 F (36.2 C) (Temporal)   Resp 18   Ht 5' 9 (1.753 m)   Wt 174 lb 4 oz (79 kg)   SpO2 96%   BMI 25.73 kg/m      Wt Readings from Last 3 Encounters:  04/27/24 174 lb 4 oz (79 kg)  02/12/24 170 lb 3.2 oz (77.2 kg)  12/22/23 169 lb (76.7 kg)

## 2024-04-28 ENCOUNTER — Encounter: Payer: Self-pay | Admitting: *Deleted

## 2024-04-28 ENCOUNTER — Other Ambulatory Visit: Payer: Self-pay

## 2024-04-28 DIAGNOSIS — C090 Malignant neoplasm of tonsillar fossa: Secondary | ICD-10-CM

## 2024-04-28 NOTE — Progress Notes (Signed)
 Called pt's wife and left detailed message about SCP appt letting her know the date and time of appt. Also left my cb number if she has any questions.

## 2024-05-10 ENCOUNTER — Telehealth: Payer: Self-pay | Admitting: Adult Health

## 2024-05-10 NOTE — Telephone Encounter (Signed)
 Canceled the nurse eval appointment with the patients wife. She is aware of the changes made.

## 2024-05-17 ENCOUNTER — Encounter: Payer: Self-pay | Admitting: Hematology and Oncology

## 2024-05-20 ENCOUNTER — Encounter: Payer: Self-pay | Admitting: Internal Medicine

## 2024-05-24 ENCOUNTER — Inpatient Hospital Stay: Attending: Hematology and Oncology | Admitting: Adult Health

## 2024-05-24 ENCOUNTER — Encounter: Payer: Self-pay | Admitting: Adult Health

## 2024-05-24 ENCOUNTER — Inpatient Hospital Stay

## 2024-05-24 ENCOUNTER — Telehealth: Payer: Self-pay | Admitting: Licensed Clinical Social Worker

## 2024-05-24 VITALS — BP 106/69 | HR 76 | Temp 98.2°F | Resp 14 | Ht 70.0 in | Wt 174.1 lb

## 2024-05-24 DIAGNOSIS — Z87891 Personal history of nicotine dependence: Secondary | ICD-10-CM | POA: Diagnosis not present

## 2024-05-24 DIAGNOSIS — Z809 Family history of malignant neoplasm, unspecified: Secondary | ICD-10-CM | POA: Insufficient documentation

## 2024-05-24 DIAGNOSIS — R1310 Dysphagia, unspecified: Secondary | ICD-10-CM | POA: Diagnosis not present

## 2024-05-24 DIAGNOSIS — C099 Malignant neoplasm of tonsil, unspecified: Secondary | ICD-10-CM | POA: Insufficient documentation

## 2024-05-24 DIAGNOSIS — R1312 Dysphagia, oropharyngeal phase: Secondary | ICD-10-CM

## 2024-05-24 NOTE — Telephone Encounter (Signed)
 [facility] Clinical Social Work  Clinical Social Work was referred by medical provider for SDOH needs.  Clinical Social Worker attempted to contact caregiver by phone to offer support and assess for needs.   No answer. Left VM with direct contact information.     Crissie Aloi E Envy Meno, LCSW  Clinical Social Worker [facility]

## 2024-05-24 NOTE — Progress Notes (Signed)
 SURVIVORSHIP VISIT:  BRIEF ONCOLOGIC HISTORY:  Oncology History  Tonsil cancer (HCC)  02/07/2016 Imaging   Ct neck showed abnormal soft tissue within the left pharynx, extending from the left tonsillar pillar inferiorly to the level of the hyoid and filling the left vallecula. The appearance is most consistent with a neoplastic process. 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 Histologic sampling and direct visualization should be considered. Confluent adenopathy within the left cervical chain with areas of central hypoattenuation suggesting degrees of necrosis. This is most suggestive of metastatic spread. The areas of necrosis within the adenopathy combined with the above-described pharyngeal soft tissue mass are together suggestive of squamous cell carcinoma.   02/14/2016 Procedure   He has FNA of left neck LN    02/14/2016 Pathology Results   [id number] cytology confirmed squamous cell carcinoma   02/26/2016 PET scan   Hypermetabolic left tonsillar in pharyngeal mass with hypermetabolic left sided station IA, station III, and station Ib adenopathy and hypermetabolic station IIa adenopathy on the right. Borderline hypermetabolic small right station IIb lymph node. Several tiny lung nodules are observed, these are not hypermetabolic but are also below the threshold level for accurate characterization. Dedicated CT chest may be warranted to act as a baseline.   03/20/2016 Procedure   Placement of a right internal jugular approach power injectable Port-A-Cath. Fluoroscopic insertion of a 20-French pull-through gastrostomy tube.    03/24/2016 - 05/06/2016 Chemotherapy   He received high dose cisplatin ; dose 2 and 3 with dose reduction due to side-effects   03/24/2016 - 05/15/2016 Radiation Therapy   Site/dose:  Left Tonsil and Bilateral Neck / 70 Gy in 35 fractions to gross disease, 63 Gy in 35 fractions to high risk nodal echelons, and 56 Gy in 35 fractions to intermediate  risk nodal echelons.  Beams/energy:   Helical IMRT / 6 MV photons   09/04/2016 Imaging   1. Significant response to therapy of left oropharyngeal/palatine tonsil primary. Low-level residual hypermetabolism could be treatment related or represent residual disease. 2. Resolution of cervical adenopathy. Low-level hypermetabolism within only 1 level 2 node remains. 3. No evidence of hypermetabolic extra cervical disease. 4. Age advanced coronary artery atherosclerosis. Recommend assessment of coronary risk factors and consideration of medical therapy. 5. Progressive sinus disease. 6. Bilateral pulmonary nodules. A right lower lobe nodule may be new. Below PET resolution. Recommend attention on follow-up.    11/24/2016 Imaging   1. Post radiation changes in the neck. Left tonsillar mass and malignant lymphadenopathy appear resolved by CT, with residual 7-8 mm short axis bilateral level 2 lymph nodes, rim calcified on the left. 2. Interval bilateral maxilla wisdom tooth extractions with new inflammation in the right maxillary sinus which appears related to the tooth extraction site. Recommend ENT consultation. 3. Chest CT today is reported separately.   11/24/2016 Imaging   1. 3 mm posterior right lower lobe pulmonary nodule seen on the previous PET-CT has resolved completely in the interval. With 3 mm nodule in the posterior left lower lobe is unchanged. Continued attention on follow-up recommended. 2. No new or progressive findings on today's exam   01/30/2017 Procedure   Removal of percutaneous gastrostomy tube and port-a-cath.   11/23/2017 Imaging   Negative for recurrent tumor or adenopathy in the neck.  Improvement in post radiation changes involving the anterior neck.  Near complete opacification right maxillary sinus. This is likely odontogenic due to bony defect from prior third molar extraction.   11/23/2017 Imaging   1. Stable  3 mm posterior left lower lobe lung nodule is unchanged from  previous exam. Favor benign abnormality. No new pulmonary nodules or evidence of metastatic disease identified. 2. Lad coronary artery atherosclerotic calcifications.   09/08/2022 PET scan   NM PET Image Restage (PS) Skull Base to Thigh (F-18 FDG)  Result Date: 09/08/2022 CLINICAL DATA:  Subsequent treatment strategy for head and neck cancer. EXAM: NUCLEAR MEDICINE PET SKULL BASE TO THIGH TECHNIQUE: 18.18 mCi F-18 FDG was injected intravenously. Full-ring PET imaging was performed from the skull base to thigh after the radiotracer. CT data was obtained and used for attenuation correction and anatomic localization. Fasting blood glucose: 100 mg/dl COMPARISON:  CT neck and chest from 03/13/2021. PET-CT from 09/04/2016 FINDINGS: Mediastinal blood pool activity: SUV max 2.37 Liver activity: SUV max NA NECK: Within the left side scratch set within the left posterior tongue there is a asymmetric focus of increased radiotracer uptake which has an SUV max of 4.33. This is compared with 1.61 on the contralateral side. No tracer avid cervical lymph nodes. Incidental CT findings: Asymmetric opacification of the left maxillary sinus. CHEST: Within the left lower lobe there is multiple areas of increased radiotracer uptake which correspond to areas of increased peribronchovascular densities, consolidation, are and atelectasis. Extensive tree-in-bud nodularity identified throughout the right middle lobe, both lower lobes and basilar right upper lobe. Occlusion of the left lower lobe airways identified, suboptimally evaluated due to nondiagnostic technique but suspicious for mucoid impaction. Subpleural nodular density within the posterior right lower lobe measures 9 mm without significant tracer uptake, image 70/7. Well-defined solid nodule in the left lower lobe is unchanged from 2022 measuring 3 mm, image 69/7 and is favored to represent a benign nodule. No discrete well-defined tracer avid lung nodule identified highly  suspicious for metastatic disease. Increased tracer uptake identified within mediastinal and hilar lymph nodes. For example, right paratracheal lymph node measures 1 cm, maintains a benign fatty hilum and has an SUV max of 3.19. Increased uptake within the left infrahilar region has an SUV max 3.63. Incidental CT findings: Coronary artery calcifications. No pericardial effusion. ABDOMEN/PELVIS: No abnormal hypermetabolic activity within the liver, pancreas, adrenal glands, or spleen. No hypermetabolic lymph nodes in the abdomen or pelvis. Incidental CT findings: Prostate gland enlargement. SKELETON: No focal hypermetabolic activity to suggest skeletal metastasis. Incidental CT findings: None. IMPRESSION: 1. Asymmetric focus of increased radiotracer uptake is identified within the left posterior tongue. Etiology indeterminate. Cannot exclude recurrent tumor. Consider further evaluation with direct visualization and/or contrast enhanced soft tissue neck CT or MRI. 2. No tracer avid cervical lymph nodes. 3. Heterogeneous, multifocal asymmetric increased uptake is identified within the left lower lobe. On the corresponding nondiagnostic CT images there is extensive tree-in-bud nodularity, thickening of the peribronchovascular interstitium, mucoid impaction, and atelectasis/airspace consolidation. Imaging findings are more likely to reflect a inflammatory/infectious process. Suspect chronic recurrent aspiration. Advise clinical correlation for signs/symptoms aspiration. Additionally, follow-up imaging with diagnostic CT of the chest in 3 months is recommended to ensure resolution and to exclude a central obstructing lesion which could also account for these changes. 4. Mildly tracer avid lymph nodes within the mediastinum and left hilum. In the setting of inflammation/infection these are likely reactive. Metastatic adenopathy is considered less favored but cannot be excluded with a high degree of certainty in this  patient who has a history of known malignancy. Attention to these lymph nodes on follow-up imaging is advised. 5. Diffuse tree-in-bud nodularity is also identified within the basilar right upper  lobe, right middle lobe and right lower lobe. This is compatible with an inflammatory/infectious bronchiolitis and may be seen with recurrent aspiration. 6. Coronary artery calcifications. 7. Prostate gland enlargement. 8. Asymmetric opacification of the left maxillary sinus. Correlate for any clinical signs or symptoms of sinusitis. Electronically Signed   By: Waddell Calk M.D.   On: 09/08/2022 09:16      09/18/2022 Imaging   1. No evidence of recurrent disease or pathologic lymphadenopathy in the neck. Specifically, there is no suspicious finding in the tongue to correspond to the focus of ill-defined radiotracer uptake on the recent PET-CT. NIRADS 1. 2. Right mastoid effusion, nonspecific.       INTERVAL HISTORY:  Discussed the use of AI scribe software for clinical note transcription with the patient, who gave verbal consent to proceed.  History of Present Illness Todd [name] is a 64 year old male with stage IV tonsillar squamous cell carcinoma in remission who presents for oncology survivorship follow-up and management of chronic oropharyngeal dysphagia with gastrostomy dependence and late effects of treatment.  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 Surgery was deferred due to concerns about healing in the radiated neck. Surveillance imaging, most recently in May, 2025, has shown no recurrent or metastatic disease.  In 2024, he developed acute worsening oropharyngeal dysphagia with complete inability to swallow and aspiration of all ingested material. Multiple esophageal dilations and botox injections were unsuccessful. A gastrostomy tube was placed in July  2024, and he remains G-tube dependent. He currently receives Boost protein shakes with milk via G-tube at about 530 calories per day, managed with dietitian input. He lost weight from 185 lbs to 135 lbs over 2-3 months in 2024 but has regained to 174 lbs today. He had four aspiration pneumonias in 2024, all treated by his primary care provider. Swallow studies show persistent severe dysfunction with only isolated improvement with water. He completed a year of speech therapy, which ended in February 2025 due to cost and plateaued benefit.  He has severe chronic oropharyngeal dysfunction with dysarthria and markedly impaired verbal communication. He often must write to communicate in public, as most people except his spouse have difficulty understanding him. He has been counseled on assistive speech devices. He denies nasal or otologic symptoms, oral ulcers, or dental pain.  He had orthostatic hypotension with syncope and falls in 2024, including one event in a medical office. He previously used amlodipine  for hypertension but has not needed antihypertensives since 2024. He checks blood pressure and heart rate at home. He receives four bottles of water daily via G-tube, yet recent labs show mild dehydration. He denies current dizziness. Bowel function is stable.  He faces significant financial strain related to insurance coverage and out-of-pocket costs for nutrition and therapy. He is covered through the Affordable Care Act until Medicare eligibility in July 2026. Recent primary care labs are notable only for mild dehydration, with normal thyroid  function. Carotid Doppler screening has been ordered due to prior head and neck radiation and carotid-encasing tumor.    REVIEW OF SYSTEMS:  Review of Systems  Constitutional:  Negative for appetite change, chills, fatigue, fever and unexpected weight change.  HENT:   Negative for hearing loss, lump/mass and trouble swallowing.   Eyes:  Negative for eye  problems and icterus.  Respiratory:  Negative for chest tightness, cough and shortness of breath.   Cardiovascular:  Negative for  chest pain, leg swelling and palpitations.  Gastrointestinal:  Negative for abdominal distention, abdominal pain, constipation, diarrhea, nausea and vomiting.  Endocrine: Negative for hot flashes.  Genitourinary:  Negative for difficulty urinating.   Musculoskeletal:  Negative for arthralgias.  Skin:  Negative for itching and rash.  Neurological:  Negative for dizziness, extremity weakness, headaches and numbness.  Hematological:  Negative for adenopathy. Does not bruise/bleed easily.  Psychiatric/Behavioral:  Negative for depression. The patient is not nervous/anxious.    Breast: Denies any new nodularity, masses, tenderness, nipple changes, or nipple discharge.       PAST MEDICAL/SURGICAL HISTORY:  Past Medical History:  Diagnosis Date   Acquired hypothyroidism    secondary to radiation //   followed by pcp   Arthritis    shoulders, neck, knees, feet   Bilateral sensorineural hearing loss    due to radiation   Decreased range of motion of neck    due to fibrotic changes of 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   Denervation atrophy of muscle    tongue ,  chronic ,  due to radiation injury   (neurologist-- dr aSABRA moores ([facility] [place])  EMG in care everywhere done 03-11-2022   Frequency of urination    GERD (gastroesophageal reflux disease)    History of cancer chemotherapy 2017   tonsil cancer   03-24-2016  to 05-06-2016   History of external beam radiation therapy    03-24-2016  to 05-15-2016  Left Tonsil and Bilateral Neck  @ [facility]   HLD (hyperlipidemia)    Hoarseness, chronic    due to radiation   Hyperlipidemia, mixed    Hypertension    pt states he has not had HTN since June 2024 (he has not taken BP meds since then since his BP has been on the lower side)   Inguinal hernia, left    Lower facial weakness    intermittant left  lower facial weakness   Numbness and tingling in left arm    intermittently per pt   Oropharyngeal dysphagia    hx neck radiation for left tonsil cancer ;  Speech therapy, regular diet with precautions swallows okay   Orthostatic hypotension    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   followed by ENT dr bates/  oncologist--- dr lonn;  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;  completed chemo 05-06-2016 and completed radiation of left tonsil/ bilateral neck 05-15-2016  w/ radiation injury to tongue (denervation chronic)   Wears glasses    Wears hearing aid in both ears    Past Surgical History:  Procedure Laterality Date   INGUINAL HERNIA REPAIR Left 05/02/2022   Procedure: OPEN LEFT INGUINAL HERNIA REPAIR WITH MESH;  Surgeon: Signe Mitzie LABOR, MD;  Location: [facility];  Service: General;  Laterality: Left;   IR GASTROSTOMY TUBE MOD SED  11/07/2022   IR GASTROSTOMY TUBE REMOVAL  01/30/2017   IR GENERIC HISTORICAL  03/20/2016   IR US  GUIDE VASC ACCESS RIGHT 03/20/2016 Norleen Roulette, MD [facility]   IR GENERIC HISTORICAL  03/20/2016   IR FLUORO GUIDE PORT INSERTION RIGHT 03/20/2016 Norleen Roulette, MD [facility]   IR GENERIC HISTORICAL  03/20/2016   IR GASTROSTOMY TUBE MOD SED 03/20/2016 Norleen Roulette, MD [facility]   IR REMOVAL TUN ACCESS W/ PORT W/O FL MOD SED  01/30/2017   IR REPLACE G-TUBE SIMPLE WO FLUORO  10/29/2023   IR REPLACE G-TUBE SIMPLE WO FLUORO  04/26/2024   IR REPLC GASTRO/COLONIC TUBE  PERCUT W/FLUORO  04/30/2023   LARYNGOSCOPY WITH BRONCHOSCOPY AND BALLOON DILATION N/A 04/14/2023   Procedure: LARYNGOSCOPY WITH BRONCHOSCOPY AND BALLOON DILATION;  Surgeon: Okey Burns, MD;  Location: [facility];  Service: ENT;  Laterality: N/A;   MICROLARYNGOSCOPY W/VOCAL CORD INJECTION Bilateral 04/14/2023   Procedure: MICROLARYNGOSCOPY WITH VOCAL CORD INJECTION;  Surgeon: Okey Burns, MD;  Location: [facility];  Service: ENT;  Laterality: Bilateral;   MYRINGOTOMY WITH TUBE PLACEMENT Right  04/14/2023   Procedure: MYRINGOTOMY WITH TUBE PLACEMENT;  Surgeon: Okey Burns, MD;  Location: [facility];  Service: ENT;  Laterality: Right;   RIGID BRONCHOSCOPY N/A 04/14/2023   Procedure: RIGID BRONCHOSCOPY;  Surgeon: Okey Burns, MD;  Location: [facility];  Service: ENT;  Laterality: N/A;     ALLERGIES:  Allergies[1]   CURRENT MEDICATIONS:  Outpatient Encounter Medications as of 05/24/2024  Medication Sig Note   cetirizine (ZYRTEC) 10 MG tablet Place 10 mg into feeding tube daily.    fenofibrate (TRICOR) 145 MG tablet Take 145mg  tablet per tube every evening    glucosamine-chondroitin 500-400 MG tablet Take 1 tablet by mouth every morning. Chewables    levothyroxine  (SYNTHROID ) 125 MCG tablet Place 125 mcg into feeding tube every evening.    [DISCONTINUED] acetaminophen  (TYLENOL ) 500 MG tablet Place 1,000 mg into feeding tube daily. LIQUID FORM ONLY    [DISCONTINUED] Coenzyme Q10 (COQ-10) 200 MG CAPS Give 200 mg by tube daily. (Patient not taking: Reported on 11/23/2023)    [DISCONTINUED] famotidine  (PEPCID ) 20 MG tablet Place 20 mg into feeding tube at bedtime. (Patient not taking: Reported on 11/23/2023)    [DISCONTINUED] fluticasone  (FLONASE ) 50 MCG/ACT nasal spray Place 2 sprays into both nostrils daily. (Patient not taking: Reported on 11/23/2023)    [DISCONTINUED] meloxicam  (MOBIC ) 7.5 MG tablet Take 1 tablet (7.5 mg total) by mouth daily as needed for pain. (Patient not taking: Reported on 11/23/2023)    [DISCONTINUED] methylPREDNISolone  (MEDROL  DOSEPAK) 4 MG TBPK tablet Take with signs of chronic sinusitis and take as directed (Patient not taking: Reported on 11/23/2023) 04/15/2023: Hasn't been started yet, due to inability to swallow   [DISCONTINUED] pantoprazole  (PROTONIX ) 40 MG tablet Crush 1 tablet and place in tube daily (Patient not taking: Reported on 11/23/2023)    No facility-administered encounter medications on file as of 05/24/2024.     ONCOLOGIC FAMILY HISTORY:  Family  History  Problem Relation Age of Onset   Dementia Father    Diabetes Father    Cancer Maternal Grandmother        unknown ca   Asthma Son      SOCIAL HISTORY:  Social History   Socioeconomic History   Marital status: Married    Spouse name: Todd [name]   Number of children: 3   Years of education: Not on file   Highest education level: Not on file  Occupational History   Occupation: airline pilot rep    Comment: [employer]  Tobacco Use   Smoking status: Never   Smokeless tobacco: Never   Tobacco comments:    Per pt smoked from age 5 to age 34 socially but none since  Vaping Use   Vaping status: Never Used  Substance and Sexual Activity   Alcohol  use: Not Currently   Drug use: Never   Sexual activity: Not on file  Other Topics Concern   Not on file  Social History Narrative   02/28/2016   Patient is divorced and remarried. Patient had 2 daughters with his first wife and one daughter passed  away due to SIDS. Patient has 2 stepchildren from his recent marriage. There ages are 83 and 25.   Patient smoked cigarettes from the ages of [age]. None since then. Patient never chewed tobacco. Patient with rare use of alcohol . Patient does not use other illicit drugs.   Social Drivers of Health   Tobacco Use: Low Risk (05/24/2024)   Patient History    Smoking Tobacco Use: Never    Smokeless Tobacco Use: Never    Passive Exposure: Not on file  Financial Resource Strain: Low Risk (05/24/2024)   Overall Financial Resource Strain (CARDIA)    Difficulty of Paying Living Expenses: Not hard at all  Food Insecurity: Food Insecurity Present (05/24/2024)   Epic    Worried About Programme Researcher, Broadcasting/film/video in the Last Year: Often true    Ran Out of Food in the Last Year: Often true  Transportation Needs: No Transportation Needs (05/24/2024)   Epic    Lack of Transportation (Medical): No    Lack of Transportation (Non-Medical): No  Physical Activity: Not on file  Stress: No Stress Concern Present  (05/24/2024)   [employer] of Occupational Health - Occupational Stress Questionnaire    Feeling of Stress: Not at all  Social Connections: Not on file  Intimate Partner Violence: Not At Risk (05/24/2024)   Epic    Fear of Current or Ex-Partner: No    Emotionally Abused: No    Physically Abused: No    Sexually Abused: No  Depression (PHQ2-9): Low Risk (05/24/2024)   Depression (PHQ2-9)    PHQ-2 Score: 0  Alcohol  Screen: Low Risk (05/24/2024)   Alcohol  Screen    Last Alcohol  Screening Score (AUDIT): 0  Housing: Unknown (05/24/2024)   Epic    Unable to Pay for Housing in the Last Year: No    Number of Times Moved in the Last Year: Not on file    Homeless in the Last Year: No  Utilities: Not At Risk (05/24/2024)   Epic    Threatened with loss of utilities: No  Health Literacy: Adequate Health Literacy (05/24/2024)   B1300 Health Literacy    Frequency of need for help with medical instructions: Never     OBSERVATIONS/OBJECTIVE:  BP 106/69 (BP Location: Left Arm, Patient Position: Sitting, Cuff Size: Normal)   Pulse 76   Temp 98.2 F (36.8 C) (Oral)   Resp 14   Ht 5' 10 (1.778 m)   Wt 174 lb 1.6 oz (79 kg)   SpO2 96%   BMI 24.98 kg/m  GENERAL: Patient is a well appearing male in no acute distress HEENT:  Sclerae anicteric.  Oropharynx clear and moist. No ulcerations or evidence of oropharyngeal candidiasis. Neck with fibrotic tissue noted anteriorly and bilaterally, no lymphadenopathy noted. NODES:  No cervical, supraclavicular, or axillary lymphadenopathy palpated.  LUNGS:  Clear to auscultation bilaterally.  No wheezes or rhonchi. HEART:  Regular rate and rhythm. No murmur appreciated. ABDOMEN:  Soft, nontender.  Positive, normoactive bowel sounds. No organomegaly palpated. G tube in place. MSK:  No focal spinal tenderness to palpation.  EXTREMITIES:  No peripheral edema.   SKIN:  Clear with no obvious rashes or skin changes.  NEURO:  Nonfocal. Well oriented.   Appropriate affect.   LABORATORY DATA:  None for this visit.  DIAGNOSTIC IMAGING:  None for this visit.      ASSESSMENT AND PLAN:  MrZoltan [name] is a pleasant 64 y.o. with history of head and neck cancer of the tonsil from 2017  status post concurrent chemoradiation.  He presents to the Survivorship [facility] for our initial meeting and routine follow-up post-completion of treatment for breast cancer.    1.  Head and neck cancer:  Mr. Todd [name] is here today for follow-up of his history of head neck cancer at the base of his tongue.  He continues to see ENT and will see ENT tomorrow.  I reviewed with him the risk of carotid artery stenosis in patients who have history of head neck cancer radiation.  I placed orders for carotid Doppler bilaterally.  His PCP is checking his thyroid  studies.  Today, a comprehensive survivorship care plan and treatment summary was reviewed with the patient today detailing his head and neck cancer diagnosis, treatment course, potential late/long-term effects of treatment, appropriate follow-up care with recommendations for the future, and patient education resources.     2.  Dysphagia: He continues to get a majority of his nutrition with his G-tube.  He has this exchanged through IR intermittently.  I reached out to [employer] registered dietitian for assistance in seeing if we can figure out a coding difference to help with his coverage of boost.  Per Harlene boost has consistently worked well for his nutrition over time.  3. Cancer screening:  Due to Mr. Todd [name]'s history and age, he should receive screening for skin cancers, colon cancer, and prostate cancer.  The information and recommendations are listed on the patient's comprehensive care plan/treatment summary and were reviewed in detail with the patient.    4.  Financial distress: Also has become a burden on the patient and his family.  I placed a social work referral as this distress is impacting his social  determinants of health and I wonder what community resources for him that we could possibly connect him with.  And his care plan I also gave him resource packet which may be helpful.  We discussed that he can get a fair cost estimate of the carotid Doppler screening and we could consider postponing to July if needed so he can have better insurance coverage at that time.   Follow up instructions:    -Return to cancer center in 1 year for a long-term follow-up   The patient was provided an opportunity to ask questions and all were answered. The patient agreed with the plan and demonstrated an understanding of the instructions.   Total encounter time:50 minutes*in face-to-face visit time, chart review, lab review, care coordination, order entry, and documentation of the encounter time.    Morna Kendall, NP 05/24/2024 3:30 PM Medical Oncology and Hematology [facility] [address] [place], [place] [address] Tel. [phone]    Fax. [phone]  *Total Encounter Time as defined by the Centers for Medicare and Medicaid Services includes, in addition to the face-to-face time of a patient visit (documented in the note above) non-face-to-face time: obtaining and reviewing outside history, ordering and reviewing medications, tests or procedures, care coordination (communications with other health care professionals or caregivers) and documentation in the medical record.     [1]  Allergies Allergen Reactions   Phenergan  [Promethazine  Hcl]     Got really nervous and shaky..   Bactrim [Sulfamethoxazole-Trimethoprim] Other (See Comments)    Flu like symptoms    Tamsulosin Other (See Comments)    Caused orthostatic hypotension per pt

## 2024-05-24 NOTE — Progress Notes (Signed)
 [employer] has taken most of the income. Very costly. Pt has a GT tube. [employer] doesn't cover it.

## 2024-05-25 ENCOUNTER — Encounter (INDEPENDENT_AMBULATORY_CARE_PROVIDER_SITE_OTHER): Payer: Self-pay

## 2024-05-25 ENCOUNTER — Ambulatory Visit (INDEPENDENT_AMBULATORY_CARE_PROVIDER_SITE_OTHER)

## 2024-05-25 ENCOUNTER — Encounter: Payer: Self-pay | Admitting: Hematology and Oncology

## 2024-05-25 ENCOUNTER — Ambulatory Visit (INDEPENDENT_AMBULATORY_CARE_PROVIDER_SITE_OTHER): Admitting: Otolaryngology

## 2024-05-25 VITALS — BP 99/64 | HR 74 | Temp 98.0°F | Wt 174.0 lb

## 2024-05-25 DIAGNOSIS — J383 Other diseases of vocal cords: Secondary | ICD-10-CM | POA: Diagnosis not present

## 2024-05-25 DIAGNOSIS — R1312 Dysphagia, oropharyngeal phase: Secondary | ICD-10-CM | POA: Diagnosis not present

## 2024-05-25 DIAGNOSIS — Z923 Personal history of irradiation: Secondary | ICD-10-CM

## 2024-05-25 DIAGNOSIS — J3801 Paralysis of vocal cords and larynx, unilateral: Secondary | ICD-10-CM | POA: Diagnosis not present

## 2024-05-25 DIAGNOSIS — Z85818 Personal history of malignant neoplasm of other sites of lip, oral cavity, and pharynx: Secondary | ICD-10-CM

## 2024-05-25 DIAGNOSIS — H6122 Impacted cerumen, left ear: Secondary | ICD-10-CM

## 2024-05-25 DIAGNOSIS — R1310 Dysphagia, unspecified: Secondary | ICD-10-CM

## 2024-05-25 DIAGNOSIS — R490 Dysphonia: Secondary | ICD-10-CM

## 2024-05-26 ENCOUNTER — Telehealth: Payer: Self-pay | Admitting: Licensed Clinical Social Worker

## 2024-05-26 NOTE — Telephone Encounter (Signed)
 [facility] Clinical Social Work  Left 2nd VM regarding referral for SDOH needs.   Eldo Umanzor E Tierre Netto, LCSW

## 2024-05-27 ENCOUNTER — Telehealth (INDEPENDENT_AMBULATORY_CARE_PROVIDER_SITE_OTHER): Payer: Self-pay

## 2024-05-27 ENCOUNTER — Inpatient Hospital Stay: Admitting: Licensed Clinical Social Worker

## 2024-05-27 NOTE — Progress Notes (Signed)
 Dear Dr. Tisovec, Here is my assessment for our mutual patient, Todd [name]. Thank you for allowing me the opportunity to care for your patient. Please do not hesitate to contact me should you have any other questions. Sincerely, Dr. Penne Croak  Otolaryngology Clinic Note Referring provider: Dr. Vernadine HPI:  Discussed the use of AI scribe software for clinical note transcription with the patient, who gave verbal consent to proceed.  History of Present Illness Todd [name] is a 64 year old male with stage IV left tonsil cancer post-chemoradiation who presents with severe oropharyngeal dysphagia.  Oropharyngeal Dysphagia and Aspiration Risk: - Severe oropharyngeal dysphagia with progressive worsening since definitive chemoradiation for left tonsil cancer eight years ago - Oropharyngeal and tongue fibrosis resulting in severely impaired tongue mobility and chronic dysphagia - Significant difficulty swallowing, prolonged meal times, and inability to clear thick secretions - Dependent on PEG tube for all nutrition for the past year due to high aspiration risk - Speech therapy enabled minimal swallowing with thickened liquids, but developed recurrent aspiration pneumonia (four episodes in 2024), necessitating cessation of all oral intake - No aspiration pneumonia since discontinuing oral intake - Continues to rinse mouth but does not swallow water or food - Uses home suction machine to manage thick secretions, with frequent coughing due to impaired clearance - Experienced rapid weight loss of approximately 35 pounds over two months in early 2024, prompting oncology re-evaluation  Sequelae of Head and Neck Cancer Treatment: - History of 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, precluding surgical resection - Underwent definitive radiation therapy and three cycles of cisplatin -based chemotherapy - Progressive oropharyngeal and tongue fibrosis - 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 and persistent severe dysphonia - No tracheostomy or laryngectomy performed - Communication primarily via severely dysphonic voice, writing, or text-to-speech applications, with frequent assistance from wife due to poor intelligibility - December 2024: Attempted pharyngeal dilation and Botox injection to larynx resulted in significant throat swelling and ICU admission; minimal improvement from dilation  Sensorineural Hearing Loss and Eustachian Tube Dysfunction: - Significant bilateral sensorineural hearing loss attributed to cisplatin  chemotherapy (approximately 80% loss in one ear, 20% in the other) - Right tympanostomy tube previously placed, resulting in improved hearing - Left ear impacted with cerumen and may require tube placement - Does not currently use hearing aids  Sleep-Disordered Breathing and Airway Symptoms: - Nocturnal stridor and noisy breathing, monitored by wife - History of sleep-disordered breathing with prior sleep study showing borderline findings - Owns CPAP machine but does not use due to intolerance - No use of Inspire therapy - Feels Mounjaro helps with sleep when lying on left side  Nutritional and Supportive Care: - Dependent on PEG tube for all nutrition - Ongoing follow-up with radiation oncologist and dietitian, with challenges in dietitian availability - Attends survivorship care at cancer center    Independent Review of Additional Tests or Records:  Reviewed external note from referring PCP, Tisovec,describing relevant history incorporated into todays evaluation.  PMH/Meds/All/SocHx/FamHx/ROS:   Past Medical History:  Diagnosis Date   Acquired hypothyroidism    secondary to radiation //   followed by pcp   Arthritis    shoulders, neck, knees, feet   Bilateral sensorineural hearing loss    due to radiation   Decreased range of motion of neck    due to fibrotic changes of 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    Denervation atrophy of muscle    tongue ,  chronic ,  due  to radiation injury   (neurologist-- dr a. rawland ([facility])  EMG in care everywhere done 03-11-2022   Frequency of urination    GERD (gastroesophageal reflux disease)    History of cancer chemotherapy 2017   tonsil cancer   03-24-2016  to 05-06-2016   History of external beam radiation therapy    03-24-2016  to 05-15-2016  Left Tonsil and Bilateral Neck  @ [facility]   HLD (hyperlipidemia)    Hoarseness, chronic    due to radiation   Hyperlipidemia, mixed    Hypertension    pt states he has not had HTN since June 2024 (he has not taken BP meds since then since his BP has been on the lower side)   Inguinal hernia, left    Lower facial weakness    intermittant left lower facial weakness   Numbness and tingling in left arm    intermittently per pt   Oropharyngeal dysphagia    hx neck radiation for left tonsil cancer ;  Speech therapy, regular diet with precautions swallows okay   Orthostatic hypotension    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   followed by ENT dr bates/  oncologist--- dr lonn;  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;  completed chemo 05-06-2016 and completed radiation of left tonsil/ bilateral neck 05-15-2016  w/ radiation injury to tongue (denervation chronic)   Wears glasses    Wears hearing aid in both ears      Past Surgical History:  Procedure Laterality Date   INGUINAL HERNIA REPAIR Left 05/02/2022   Procedure: OPEN LEFT INGUINAL HERNIA REPAIR WITH MESH;  Surgeon: Signe Mitzie LABOR, MD;  Location: [facility];  Service: General;  Laterality: Left;   IR GASTROSTOMY TUBE MOD SED  11/07/2022   IR GASTROSTOMY TUBE REMOVAL  01/30/2017   IR GENERIC HISTORICAL  03/20/2016   IR US  GUIDE VASC ACCESS RIGHT 03/20/2016 Norleen Roulette, MD [facility]   IR GENERIC HISTORICAL  03/20/2016   IR FLUORO GUIDE PORT INSERTION RIGHT 03/20/2016 Norleen Roulette, MD [facility]   IR GENERIC HISTORICAL   03/20/2016   IR GASTROSTOMY TUBE MOD SED 03/20/2016 Norleen Roulette, MD [facility]   IR REMOVAL TUN ACCESS W/ PORT W/O FL MOD SED  01/30/2017   IR REPLACE G-TUBE SIMPLE WO FLUORO  10/29/2023   IR REPLACE G-TUBE SIMPLE WO FLUORO  04/26/2024   IR REPLC GASTRO/COLONIC TUBE PERCUT W/FLUORO  04/30/2023   LARYNGOSCOPY WITH BRONCHOSCOPY AND BALLOON DILATION N/A 04/14/2023   Procedure: LARYNGOSCOPY WITH BRONCHOSCOPY AND BALLOON DILATION;  Surgeon: Okey Burns, MD;  Location: [facility];  Service: ENT;  Laterality: N/A;   MICROLARYNGOSCOPY W/VOCAL CORD INJECTION Bilateral 04/14/2023   Procedure: MICROLARYNGOSCOPY WITH VOCAL CORD INJECTION;  Surgeon: Okey Burns, MD;  Location: [facility];  Service: ENT;  Laterality: Bilateral;   MYRINGOTOMY WITH TUBE PLACEMENT Right 04/14/2023   Procedure: MYRINGOTOMY WITH TUBE PLACEMENT;  Surgeon: Okey Burns, MD;  Location: [facility];  Service: ENT;  Laterality: Right;   RIGID BRONCHOSCOPY N/A 04/14/2023   Procedure: RIGID BRONCHOSCOPY;  Surgeon: Okey Burns, MD;  Location: [facility];  Service: ENT;  Laterality: N/A;    Family History  Problem Relation Age of Onset   Dementia Father    Diabetes Father    Cancer Maternal Grandmother        unknown ca   Asthma Son      Social Connections: Not on file     Current Medications[1]   Physical  Exam:   BP 99/64 (BP Location: Left Arm, Patient Position: Sitting, Cuff Size: Normal)   Pulse 74   Temp 98 F (36.7 C)   Wt 174 lb (78.9 kg)   SpO2 91%   BMI 24.97 kg/m   The patient was awake, alert, and appropriate. The external ears were inspected, and otoscopy was performed to evaluate the external auditory canals and tympanic membranes. The nasal cavity and septum were examined for mucosal changes, obstruction, or discharge. The oral cavity and oropharynx were inspected for mucosal lesions, infection, or tonsillar hypertrophy. The neck was palpated for lymphadenopathy, thyroid  abnormalities, or other masses. Cranial  nerve function was grossly intact.  Pertinent Findings: General: Well developed, well nourished. No acute distress. Voice intelligible  Head/Face: Normocephalic. No sinus tenderness. No facial lacerations. Eyes: PERRL, no scleral icterus or conjunctival hemorrhage. EOMI. Hearing: Normal speech reception.  Mouth/Oropharynx: Lips without any lesions. Dentition multiple missing teeth. No mucosal lesions within the oropharynx. No tonsillar enlargement, exudate, or lesions. Larynx: See TFL if applicable Nasopharynx: See TFL if applicable Neck: Trachea midline. No masses. No thyromegaly or nodules palpated. No crepitus. Lymphatic: No lymphadenopathy in the neck. Respiratory: No stridor or distress. Room air. Cardiovascular: Regular rate and rhythm. Extremities: No edema or cyanosis. Warm and well-perfused. Skin: No scars or lesions on face or neck. Neurologic: CN II-XII grossly intact. Moving all extremities without gross abnormality. Other:  Physical Exam HEENT: Cerumen impaction in the left ear. Deviated septum to the right side. Neck, oral cavity and oropharynx with post radiation changes. Tongue mobility absent.  Seprately Identifiable Procedures:  I personally ordered, reviewed and interpreted the following with the patient today  Procedure Note Pre-procedure diagnosis:  Dysphonia and dysphagia Post-procedure diagnosis: Same Procedure: Transnasal Fiberoptic Laryngoscopy, CPT 31575 - Mod 25 Indication: dysphonia and dysphagia, hx of SCCa, hx of CRT Complications: None apparent EBL: 0 mL  The procedure was undertaken to further evaluate the patient's complaint of dysphaiga an ddysphonia, with mirror exam inadequate for appropriate examination due to gag reflex and poor patient tolerance  Procedure:  Patient was identified as correct patient. Verbal consent was obtained. The nose was sprayed with oxymetazoline  and 4% lidocaine . The The flexible laryngoscope was passed through the nose  to view the nasal cavity, pharynx (oropharynx, hypopharynx) and larynx.  The larynx was examined at rest and during multiple phonatory tasks. Documentation was obtained and reviewed with patient. The scope was removed. The patient tolerated the procedure well.  Findings: The nasal cavity and nasopharynx did not reveal any masses or lesions, mucosa appeared to be without obvious lesions. The tongue base, pharyngeal walls, piriform sinuses, vallecula, epiglottis and postcricoid region are normal in appearance EXCEPT: glottic opening to 1-3 mm with expansion to approximately 2-4 mm on deep breath. Right vocal cord with limited movement. Paralytic left TVF. The visualized portion of the subglottis and proximal trachea is widely patent. There are no lesions on the free edge of the vocal folds nor elsewhere in the larynx worrisome for malignancy.    Electronically signed by: Penne Croak, DO 05/28/2024 2:34 PM   Impression & Plans:  Todd [name] is a 64 y.o. male  1. Oropharyngeal dysphagia   2. Vocal fold paralysis, left   3. Dysphagia, unspecified type   4. History of radiation to head and neck region   5. Vocal fold atrophy   6. Vocal fold paresis, right   7. Left ear impacted cerumen    - Findings and diagnoses discussed in detail with  the patient. - Risks, benefits, and alternatives were reviewed. Through shared decision making, the patient elects to proceed with below. Assessment & Plan Azrael and his wife are a pleasant couple. Demont has severe exacerbation of oropharyngeal dysphagia with PEG tube dependence.  This was my first time meeting Todd [name]. He is approaching a nonfunctional larynx. Last year he had 4 episodes of aspiration pneumonia. This has not happened since his esophageal dilations and NPO status. However, he also has a poor cough reflex and significant pooling of secretions. Furthermore, his left TVF is paralyzed and his right TVF is quite paretic with 1-38mm of excursion on deep  inhalation.   Outside of his cancer history, he is in quite good health. No one has discussed total laryngectomy with him and I believe he would be a good candidate.  - He desires to eat again.   Risks include loss of natural voice, alternative speech methods, and surgical morbidity.  - Discussed total laryngectomy as future option; recommended consultation with head and neck surgeon at [facility]. - Advised to remain NPO; continue PEG tube feeds. - Recommended revisiting speech therapy and swallowing exercises if surgery pursued.  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 Current issues are late treatment effects.  Radiation-induced oropharyngeal and tongue fibrosis Chronic progressive fibrosis of oropharynx and tongue causing reduced mobility, thickened tissues, worsening swallowing and speech. Progressive decline expected. Total laryngectomy may be indicated for severe dysphagia and aspiration risk. - Discussed progressive nature of radiation fibrosis and anticipated decline. - Discussed total laryngectomy may be indicated for severe dysphagia and aspiration risk.  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 Minimal right vocal cord movement, approaching bilateral paralysis. Total laryngectomy would address airway and aspiration concerns. - Discussed total laryngectomy would address airway and aspiration concerns if indicated.  - Discussed reasons to go to the ED  History of recurrent aspiration pneumonia History of recurrent aspiration pneumonia. High risk for recurrence if oral intake resumed without intervention. Total laryngectomy would eliminate aspiration risk by separating airway and alimentary tract.  Eustachian tube dysfunction with right  tympanostomy tube Eustachian tube dysfunction managed with right tympanostomy tube, improved hearing. Left ear impacted with cerumen, may require tube if symptoms persist. - Recommended hydrogen peroxide for left ear cerumen impaction. - audio and cerumen removal at next visit  - Orders placed:  Orders Placed This Encounter  Procedures   Ambulatory referral to ENT   - Medications prescribed/continued/adjusted: No orders of the defined types were placed in this encounter.  - Education materials provided to the patient. - Follow up: 2 months, possible cerumen removal, will need updated audiogram. Patient instructed to return sooner or go to the ED if new/worsening symptoms develop.  Thank you for allowing me the opportunity to care for your patient. Please do not hesitate to contact me should you have any other questions.  Sincerely, Penne Croak, DO Otolaryngologist (ENT) [facility] Phone: [phone] Fax: [phone]  05/28/2024, 2:34 PM        [1]  Current Outpatient Medications:    cetirizine (ZYRTEC) 10 MG tablet, Place 10 mg into feeding tube daily., Disp: , Rfl:    fenofibrate (TRICOR) 145 MG tablet, Take 145mg  tablet per tube every evening, Disp: , Rfl:    glucosamine-chondroitin 500-400 MG tablet, Take 1 tablet by mouth every morning.  Chewables, Disp: , Rfl:    levothyroxine  (SYNTHROID ) 125 MCG tablet, Place 125 mcg into feeding tube every evening., Disp: , Rfl:

## 2024-05-27 NOTE — Progress Notes (Signed)
 [facility] Clinical Social Work  Clinical Social Work was referred by Todd [name] for financial concerns.  Clinical Social Worker spoke with pt's wife, Todd [name], to offer support and assess for needs.    Todd [name] reports that finances are tight as they are on fixed income (pt is on SSDI and wife is on tree surgeon) and they have some savings, but they are being drained. Biggest concern is medical costs, especially related to tube feeding, as insurance is not covering well and pt won't be on Medicare until July.  Unfortunately, they are above income for Medicaid and other government programs.   Interventions: Provided information for [facility] billing to determine if they qualify for [facility] financial assistance Provided information on Medicare.gov to help compare Medicare plans      Follow Up Plan:  Spouse/Patient will contact CSW with any support or resource needs    Todd [name] E Todd Hopfer, LCSW  Clinical Social Worker [facility]

## 2024-05-27 NOTE — Telephone Encounter (Signed)
 Patient wife left voice message that they need clarification on what they are suppose to be doing at home.

## 2024-05-30 NOTE — Telephone Encounter (Signed)
 I did inform her that Dr. Anice is out of office all week, he may not address these matters until the week after when he is back in office. If any problems, do not hesitate to call me back.She understood.

## 2024-05-30 NOTE — Telephone Encounter (Signed)
 I spoke with patients wife this morning and went over everything Dr. Anice put in patients notes about softening the wax in ears before ear cleaning visit. She asked that I make Dr. Anice aware of some notes changes that need to be made. 1. The patient does wear hearing aids. 2. Patient does not have OSA or a C-pap machine, she does does but not our patient. She also asked who Dr. Anice referred her husband out to. I gave her the name of Dr. Wiliam in [place]. She stated that is he has to go to [facility], they will but really prefer [facility] for closeness.

## 2024-05-31 ENCOUNTER — Encounter (INDEPENDENT_AMBULATORY_CARE_PROVIDER_SITE_OTHER): Payer: Self-pay

## 2024-08-23 ENCOUNTER — Ambulatory Visit (INDEPENDENT_AMBULATORY_CARE_PROVIDER_SITE_OTHER)

## 2024-11-14 ENCOUNTER — Other Ambulatory Visit (HOSPITAL_COMMUNITY)

## 2025-04-25 ENCOUNTER — Ambulatory Visit: Admitting: Radiation Oncology

## 2025-05-24 ENCOUNTER — Inpatient Hospital Stay: Admitting: Adult Health

## 2025-05-24 ENCOUNTER — Inpatient Hospital Stay

## 2025-05-25 ENCOUNTER — Inpatient Hospital Stay: Admitting: Adult Health
